# Patient Record
Sex: Female | Born: 1955
Health system: Southern US, Community
[De-identification: ages and names within clinical notes are randomized; demographics above are authoritative.]

## PROBLEM LIST (undated history)

## (undated) DIAGNOSIS — M1990 Unspecified osteoarthritis, unspecified site: Secondary | ICD-10-CM

## (undated) DIAGNOSIS — H409 Unspecified glaucoma: Secondary | ICD-10-CM

## (undated) DIAGNOSIS — R7303 Prediabetes: Secondary | ICD-10-CM

## (undated) DIAGNOSIS — G629 Polyneuropathy, unspecified: Secondary | ICD-10-CM

## (undated) DIAGNOSIS — I10 Essential (primary) hypertension: Secondary | ICD-10-CM

## (undated) DIAGNOSIS — K08109 Complete loss of teeth, unspecified cause, unspecified class: Secondary | ICD-10-CM

## (undated) DIAGNOSIS — I639 Cerebral infarction, unspecified: Secondary | ICD-10-CM

## (undated) DIAGNOSIS — R21 Rash and other nonspecific skin eruption: Secondary | ICD-10-CM

## (undated) DIAGNOSIS — F0150 Vascular dementia without behavioral disturbance: Secondary | ICD-10-CM

## (undated) DIAGNOSIS — E559 Vitamin D deficiency, unspecified: Secondary | ICD-10-CM

## (undated) DIAGNOSIS — Z86718 Personal history of other venous thrombosis and embolism: Secondary | ICD-10-CM

## (undated) DIAGNOSIS — Z8673 Personal history of transient ischemic attack (TIA), and cerebral infarction without residual deficits: Secondary | ICD-10-CM

## (undated) DIAGNOSIS — G43909 Migraine, unspecified, not intractable, without status migrainosus: Secondary | ICD-10-CM

## (undated) DIAGNOSIS — K219 Gastro-esophageal reflux disease without esophagitis: Secondary | ICD-10-CM

## (undated) DIAGNOSIS — R011 Cardiac murmur, unspecified: Secondary | ICD-10-CM

## (undated) DIAGNOSIS — Z973 Presence of spectacles and contact lenses: Secondary | ICD-10-CM

## (undated) DIAGNOSIS — Z9989 Dependence on other enabling machines and devices: Secondary | ICD-10-CM

## (undated) DIAGNOSIS — R0789 Other chest pain: Secondary | ICD-10-CM

## (undated) DIAGNOSIS — R9431 Abnormal electrocardiogram [ECG] [EKG]: Secondary | ICD-10-CM

## (undated) DIAGNOSIS — Z87898 Personal history of other specified conditions: Secondary | ICD-10-CM

## (undated) DIAGNOSIS — D509 Iron deficiency anemia, unspecified: Secondary | ICD-10-CM

## (undated) DIAGNOSIS — Z872 Personal history of diseases of the skin and subcutaneous tissue: Secondary | ICD-10-CM

## (undated) DIAGNOSIS — J309 Allergic rhinitis, unspecified: Secondary | ICD-10-CM

## (undated) HISTORY — PX: FOOT SURGERY: SHX648

## (undated) HISTORY — PX: BUNIONECTOMY: SHX129

## (undated) HISTORY — PX: BONE BIOPSY: SHX375

## (undated) HISTORY — PX: HARDWARE REMOVAL: SHX979

## (undated) HISTORY — DX: Other chest pain: R07.89

## (undated) HISTORY — PX: COLONOSCOPY: SHX174

## (undated) HISTORY — DX: Abnormal electrocardiogram (ECG) (EKG): R94.31

## (undated) HISTORY — PX: CARDIAC CATHETERIZATION: SHX172

---
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
## 1998-01-20 ENCOUNTER — Inpatient Hospital Stay (HOSPITAL_COMMUNITY): Admission: AD | Admit: 1998-01-20 | Discharge: 1998-01-20 | Payer: Self-pay | Admitting: Obstetrics

## 1998-01-23 ENCOUNTER — Ambulatory Visit (HOSPITAL_COMMUNITY): Admission: RE | Admit: 1998-01-23 | Discharge: 1998-01-23 | Payer: Self-pay | Admitting: Obstetrics

## 1998-01-23 ENCOUNTER — Encounter: Admission: RE | Admit: 1998-01-23 | Discharge: 1998-01-23 | Payer: Self-pay | Admitting: Obstetrics

## 1998-02-06 ENCOUNTER — Encounter: Admission: RE | Admit: 1998-02-06 | Discharge: 1998-02-06 | Payer: Self-pay | Admitting: Obstetrics

## 1998-02-06 ENCOUNTER — Ambulatory Visit (HOSPITAL_COMMUNITY): Admission: RE | Admit: 1998-02-06 | Discharge: 1998-02-06 | Payer: Self-pay | Admitting: Obstetrics

## 1998-02-17 ENCOUNTER — Ambulatory Visit (HOSPITAL_COMMUNITY): Admission: RE | Admit: 1998-02-17 | Discharge: 1998-02-17 | Payer: Self-pay | Admitting: Obstetrics

## 1998-02-27 ENCOUNTER — Other Ambulatory Visit: Admission: RE | Admit: 1998-02-27 | Discharge: 1998-02-27 | Payer: Self-pay | Admitting: Obstetrics

## 1998-02-27 ENCOUNTER — Encounter: Admission: RE | Admit: 1998-02-27 | Discharge: 1998-02-27 | Payer: Self-pay | Admitting: Obstetrics

## 1998-06-14 ENCOUNTER — Emergency Department (HOSPITAL_COMMUNITY): Admission: EM | Admit: 1998-06-14 | Discharge: 1998-06-14 | Payer: Self-pay | Admitting: Emergency Medicine

## 1998-06-14 ENCOUNTER — Encounter: Payer: Self-pay | Admitting: Emergency Medicine

## 1998-06-25 ENCOUNTER — Encounter: Admission: RE | Admit: 1998-06-25 | Discharge: 1998-09-23 | Payer: Self-pay | Admitting: Family Medicine

## 2000-07-11 ENCOUNTER — Other Ambulatory Visit: Admission: RE | Admit: 2000-07-11 | Discharge: 2000-07-11 | Payer: Self-pay | Admitting: Family Medicine

## 2001-09-14 ENCOUNTER — Encounter: Payer: Self-pay | Admitting: Emergency Medicine

## 2001-09-14 ENCOUNTER — Emergency Department (HOSPITAL_COMMUNITY): Admission: EM | Admit: 2001-09-14 | Discharge: 2001-09-14 | Payer: Self-pay | Admitting: Emergency Medicine

## 2001-10-19 ENCOUNTER — Ambulatory Visit (HOSPITAL_COMMUNITY): Admission: RE | Admit: 2001-10-19 | Discharge: 2001-10-19 | Payer: Self-pay | Admitting: Internal Medicine

## 2001-10-24 ENCOUNTER — Encounter: Payer: Self-pay | Admitting: Family Medicine

## 2001-10-24 ENCOUNTER — Ambulatory Visit (HOSPITAL_COMMUNITY): Admission: RE | Admit: 2001-10-24 | Discharge: 2001-10-24 | Payer: Self-pay | Admitting: Family Medicine

## 2001-10-29 ENCOUNTER — Emergency Department (HOSPITAL_COMMUNITY): Admission: EM | Admit: 2001-10-29 | Discharge: 2001-10-29 | Payer: Self-pay | Admitting: *Deleted

## 2002-02-27 ENCOUNTER — Ambulatory Visit (HOSPITAL_COMMUNITY): Admission: RE | Admit: 2002-02-27 | Discharge: 2002-02-27 | Payer: Self-pay | Admitting: Family Medicine

## 2002-02-27 ENCOUNTER — Encounter: Payer: Self-pay | Admitting: Family Medicine

## 2003-01-17 ENCOUNTER — Ambulatory Visit (HOSPITAL_COMMUNITY): Admission: RE | Admit: 2003-01-17 | Discharge: 2003-01-17 | Payer: Self-pay | Admitting: Family Medicine

## 2003-01-17 ENCOUNTER — Encounter: Payer: Self-pay | Admitting: Family Medicine

## 2003-12-24 ENCOUNTER — Ambulatory Visit: Payer: Self-pay | Admitting: Family Medicine

## 2003-12-31 ENCOUNTER — Ambulatory Visit (HOSPITAL_COMMUNITY): Admission: RE | Admit: 2003-12-31 | Discharge: 2003-12-31 | Payer: Self-pay | Admitting: Family Medicine

## 2004-03-03 ENCOUNTER — Ambulatory Visit: Payer: Self-pay | Admitting: Family Medicine

## 2004-03-31 ENCOUNTER — Ambulatory Visit: Payer: Self-pay | Admitting: Family Medicine

## 2004-08-06 ENCOUNTER — Ambulatory Visit (HOSPITAL_COMMUNITY): Admission: RE | Admit: 2004-08-06 | Discharge: 2004-08-06 | Payer: Self-pay | Admitting: Internal Medicine

## 2004-10-14 ENCOUNTER — Inpatient Hospital Stay (HOSPITAL_COMMUNITY): Admission: AD | Admit: 2004-10-14 | Discharge: 2004-10-17 | Payer: Self-pay | Admitting: Internal Medicine

## 2005-01-08 ENCOUNTER — Ambulatory Visit (HOSPITAL_COMMUNITY): Admission: RE | Admit: 2005-01-08 | Discharge: 2005-01-08 | Payer: Self-pay | Admitting: Internal Medicine

## 2005-04-23 ENCOUNTER — Emergency Department (HOSPITAL_COMMUNITY): Admission: EM | Admit: 2005-04-23 | Discharge: 2005-04-23 | Payer: Self-pay | Admitting: *Deleted

## 2005-05-12 ENCOUNTER — Ambulatory Visit (HOSPITAL_COMMUNITY): Admission: RE | Admit: 2005-05-12 | Discharge: 2005-05-12 | Payer: Self-pay | Admitting: Obstetrics

## 2005-05-20 ENCOUNTER — Emergency Department (HOSPITAL_COMMUNITY): Admission: EM | Admit: 2005-05-20 | Discharge: 2005-05-20 | Payer: Self-pay | Admitting: Emergency Medicine

## 2005-08-24 ENCOUNTER — Encounter: Admission: RE | Admit: 2005-08-24 | Discharge: 2005-08-24 | Payer: Self-pay | Admitting: Internal Medicine

## 2005-10-19 ENCOUNTER — Emergency Department (HOSPITAL_COMMUNITY): Admission: EM | Admit: 2005-10-19 | Discharge: 2005-10-19 | Payer: Self-pay | Admitting: Emergency Medicine

## 2006-02-04 ENCOUNTER — Ambulatory Visit (HOSPITAL_COMMUNITY): Admission: RE | Admit: 2006-02-04 | Discharge: 2006-02-04 | Payer: Self-pay | Admitting: Internal Medicine

## 2007-02-07 ENCOUNTER — Ambulatory Visit (HOSPITAL_COMMUNITY): Admission: RE | Admit: 2007-02-07 | Discharge: 2007-02-07 | Payer: Self-pay | Admitting: Internal Medicine

## 2007-04-19 ENCOUNTER — Emergency Department (HOSPITAL_COMMUNITY): Admission: EM | Admit: 2007-04-19 | Discharge: 2007-04-19 | Payer: Self-pay | Admitting: Emergency Medicine

## 2008-03-12 ENCOUNTER — Ambulatory Visit (HOSPITAL_COMMUNITY): Admission: RE | Admit: 2008-03-12 | Discharge: 2008-03-12 | Payer: Self-pay | Admitting: Obstetrics

## 2008-06-03 ENCOUNTER — Emergency Department (HOSPITAL_COMMUNITY): Admission: EM | Admit: 2008-06-03 | Discharge: 2008-06-03 | Payer: Self-pay | Admitting: Emergency Medicine

## 2008-06-19 ENCOUNTER — Other Ambulatory Visit: Payer: Self-pay | Admitting: Emergency Medicine

## 2008-06-19 ENCOUNTER — Ambulatory Visit: Payer: Self-pay | Admitting: Psychiatry

## 2008-06-19 ENCOUNTER — Inpatient Hospital Stay (HOSPITAL_COMMUNITY): Admission: RE | Admit: 2008-06-19 | Discharge: 2008-06-24 | Payer: Self-pay | Admitting: Psychiatry

## 2008-10-25 ENCOUNTER — Ambulatory Visit (HOSPITAL_COMMUNITY): Admission: RE | Admit: 2008-10-25 | Discharge: 2008-10-25 | Payer: Self-pay | Admitting: Obstetrics

## 2009-03-02 ENCOUNTER — Emergency Department (HOSPITAL_COMMUNITY): Admission: EM | Admit: 2009-03-02 | Discharge: 2009-03-02 | Payer: Self-pay | Admitting: Emergency Medicine

## 2009-03-13 ENCOUNTER — Ambulatory Visit (HOSPITAL_COMMUNITY): Admission: RE | Admit: 2009-03-13 | Discharge: 2009-03-13 | Payer: Self-pay | Admitting: Internal Medicine

## 2009-03-31 ENCOUNTER — Encounter (INDEPENDENT_AMBULATORY_CARE_PROVIDER_SITE_OTHER): Payer: Self-pay | Admitting: *Deleted

## 2009-04-02 ENCOUNTER — Ambulatory Visit: Payer: Self-pay | Admitting: Gastroenterology

## 2009-04-23 ENCOUNTER — Ambulatory Visit: Payer: Self-pay | Admitting: Gastroenterology

## 2010-01-22 ENCOUNTER — Ambulatory Visit: Payer: Self-pay | Admitting: Internal Medicine

## 2010-02-02 ENCOUNTER — Emergency Department (HOSPITAL_COMMUNITY)
Admission: EM | Admit: 2010-02-02 | Discharge: 2010-02-02 | Payer: Self-pay | Source: Home / Self Care | Admitting: Family Medicine

## 2010-04-30 ENCOUNTER — Ambulatory Visit (HOSPITAL_COMMUNITY)
Admission: RE | Admit: 2010-04-30 | Discharge: 2010-04-30 | Payer: Self-pay | Source: Home / Self Care | Attending: Internal Medicine | Admitting: Internal Medicine

## 2010-05-02 ENCOUNTER — Encounter: Payer: Self-pay | Admitting: Internal Medicine

## 2010-05-03 ENCOUNTER — Encounter: Payer: Self-pay | Admitting: Obstetrics

## 2010-05-12 NOTE — Procedures (Signed)
Summary: Colonoscopy  Patient: Alexis Ball Note: All result statuses are Final unless otherwise noted.  Tests: (1) Colonoscopy (COL)   COL Colonoscopy           DONE     [facility]     [address]     [place], [place]  [address]           COLONOSCOPY PROCEDURE REPORT           PATIENT:  Alexis, Ball  MR#:  [id number]     BIRTHDATE:  01-02-1956, 53 yrs. old  GENDER:  female           ENDOSCOPIST:  Vania Rea. Jarold Motto, MD, [facility]     Referred by:  Della Goo, M.D.           PROCEDURE DATE:  04/23/2009     PROCEDURE:  Average-risk screening colonoscopy     G0121     ASA CLASS:  Class II     INDICATIONS:  Routine Risk Screening           MEDICATIONS:   Fentanyl 50 mcg IV, Versed 6 mg IV           DESCRIPTION OF PROCEDURE:   After the risks benefits and     alternatives of the procedure were thoroughly explained, informed     consent was obtained.  Digital rectal exam was performed and     revealed no abnormalities.   The LB CF-H180AL [id number] endoscope     was introduced through the anus and advanced to the cecum, which     was identified by both the appendix and ileocecal valve, without     limitations.  The quality of the prep was adequate, using     MoviPrep.  The instrument was then slowly withdrawn as the colon     was fully examined.     <<PROCEDUREIMAGES>>           FINDINGS:  Scattered diverticula were found in the sigmoid colon.     No polyps or cancers were seen.  This was otherwise a normal     examination of the colon.   Retroflexed views in the rectum     revealed no abnormalities.    The scope was then withdrawn from     the patient and the procedure completed.           COMPLICATIONS:  None           ENDOSCOPIC IMPRESSION:     1) Diverticula, scattered in the sigmoid colon     2) No polyps or cancers     3) Otherwise normal examination     RECOMMENDATIONS:     1) Continue current colorectal screening recommendations for     "routine  risk" patients with a repeat colonoscopy in 10 years.     2) high fiber diet           REPEAT EXAM:  No           ______________________________     Vania Rea. Jarold Motto, MD, [facility] Graham           CC:           n.     eSIGNED:   Vania Rea. Felesha Moncrieffe at 04/23/2009 10:15 AM           Basich, Blackwell, [id number]  Note: An exclamation mark (!) indicates a result that was not dispersed into the flowsheet. Document Creation Date: 04/23/2009 10:15  AM _______________________________________________________________________  (1) Order result status: Final Collection or observation date-time: 04/23/2009 10:11 Requested date-time:  Receipt date-time:  Reported date-time:  Referring Physician:   Ordering Physician: Sheryn Bison [phone]) Specimen Source:  Source: Launa Grill Order Number: [id number] Lab site:   Appended Document: Colonoscopy    Clinical Lists Changes  Observations: Added new observation of COLONNXTDUE: 04/2019 (04/23/2009 11:47)

## 2010-06-24 LAB — POCT URINALYSIS DIPSTICK
Glucose, UA: NEGATIVE mg/dL
Protein, ur: NEGATIVE mg/dL
Urobilinogen, UA: 0.2 mg/dL (ref 0.0–1.0)
pH: 6 (ref 5.0–8.0)

## 2010-07-23 LAB — URINALYSIS, ROUTINE W REFLEX MICROSCOPIC
Bilirubin Urine: NEGATIVE
Glucose, UA: NEGATIVE mg/dL
Hgb urine dipstick: NEGATIVE
Protein, ur: NEGATIVE mg/dL
pH: 6.5 (ref 5.0–8.0)

## 2010-07-23 LAB — CBC
MCV: 88.7 fL (ref 78.0–100.0)
Platelets: 240 10*3/uL (ref 150–400)
WBC: 5.3 10*3/uL (ref 4.0–10.5)

## 2010-07-23 LAB — DIFFERENTIAL
Basophils Relative: 0 % (ref 0–1)
Lymphocytes Relative: 41 % (ref 12–46)
Monocytes Absolute: 0.4 10*3/uL (ref 0.1–1.0)
Neutro Abs: 2.7 10*3/uL (ref 1.7–7.7)
Neutrophils Relative %: 49 % (ref 43–77)

## 2010-07-23 LAB — BASIC METABOLIC PANEL
Calcium: 9 mg/dL (ref 8.4–10.5)
Chloride: 108 mEq/L (ref 96–112)
GFR calc non Af Amer: >60 mL/min (ref >60)
Glucose, Bld: 106 mg/dL — ABNORMAL HIGH (ref 70–99)
Potassium: 3.6 mEq/L (ref 3.5–5.1)
Sodium: 143 mEq/L (ref 135–145)

## 2010-07-23 LAB — POCT TOXICOLOGY PANEL

## 2010-07-23 LAB — URINE MICROSCOPIC-ADD ON

## 2010-07-23 LAB — ETHANOL: Alcohol, Ethyl (B): <5 mg/dL (ref 0–10)

## 2010-08-28 NOTE — Cardiovascular Report (Signed)
Alexis Ball, Alexis Ball             ACCOUNT NO.:  [id number]   MEDICAL RECORD NO.:  [id number]          PATIENT TYPE:  INP   LOCATION:  4741                         FACILITY:  [facility]   PHYSICIAN:  Cristy Hilts. Jacinto Halim, MD       DATE OF BIRTH:  1956/03/08   DATE OF PROCEDURE:  10/16/2004  DATE OF DISCHARGE:                              CARDIAC CATHETERIZATION   PROCEDURES PERFORMED:  1.  Left ventriculography.  2.  Selective left coronary arteriography.  3.  Abdominal aortogram.  4.  Selective left renal arteriography.  5.  Right femoral angiography and closure of right femoral arterial access      with StarClose.   INDICATIONS:  Alexis Ball is a 55 year old, African-American female with  history of hypertension, who was admitted to the hospital with chest pain.  She also had a markedly abnormal EKG.  Given her ongoing chest pain and  abnormal EKG, she was brought to the cardiac catheterization lab for  definitive diagnosis of coronary disease.   HEMODYNAMIC DATA:  The left ventricular pressures were 139/4, with end-  diastolic pressure of 12 mmHg.  The aortic pressure was 152/77, with a mean  of 111 mmHg.  There was no pressure gradient across the aortic valve.   ANGIOGRAPHIC DATA:  Left ventricle:  Left ventricular systolic function was  in the lower limit of normal, estimated around 50-55%.  There was no  significant mitral regurgitation.   Right coronary artery:  Right coronary artery is a large-caliber vessel.  It  is mildly tortuous in its mid segment.  Otherwise, it is normal, with a  small PDA and a small PLA.   Left main:  Left main is nonexistent.   LAD:  The LAD has a separate ostial origin from the aortic root.  The LAD is  mildly tortuous in the distal segment.  In the proximal segment, it gives  origin to moderate-size D1 and D2.  It is normal.   Circumflex:  Circumflex again has a separate ostial origin from the aorta,  and the circumflex gives origin to a large OM1 and  continues as an OM2.  The  OM2 is especially highly tortuous.  Otherwise, the vessels are normal.   Abdominal aortogram:  Abdominal aortogram revealed two renal arteries one on  either side.  There was appearance of stenosis of the left.   Selective left renal arteriography:  Selective left renal arteriography  revealed widely patent left renal artery.  There is no evidence of renal  artery stenosis in either of the renal arteries.   IMPRESSIONS:  1.  Low normal left ventricular systolic function, ejection fraction 50-55%.      No significant mitral regurgitation.  2.  Normal coronary arteries.  3.  Tortuous coronary arteries.  4.  Findings suggestive of hypertension with hypertensive heart disease.  5.  Widely patent renal arteries.   RECOMMENDATIONS:  Therapy for hypertension is indicated.  Evaluation of  noncardiac cause of chest pain is indicated.  The patient will follow up  with Dr. Della Goo for further management and evaluation.  She may  benefit  from giving Lotrel which would be a combination of amlodipine and  ACE inhibitor.  Her diastolic blood pressure in the catheterization lab was  around 105 mmHg.   TECHNIQUE OF THE PROCEDURE:  Under usual sterile precautions, using a 6  French right femoral arterial access, 6 [place] multipurpose pigtail catheter  was advanced into the ascending aorta over a 0.03 [place] J-wire.  The  catheter was entered into the left ventricle.  Left ventricular pressures  were monitored.  Hand contrast injection of LV was performed in both the LAO  and RAO projections.  The catheter was flushed with saline and pulled back  in the ascending aorta, and pressure gradient across the aortic valve was  monitored.  The right coronary artery was selective engaged, and angiography  was performed.  In a similar fashion, selective LAD and circumflex were  cannulated.  Angiography was performed.  Then, the catheter was pulled back  into the abdominal  aorta, and abdominal aortogram and selective left renal  arteriography was performed.  Then, the catheter was pulled out of the body  in the usual fashion.  Right femoral angiography was performed through the  arterial access sheath, and the access was closed with StarClose.  A total  of 100 cc of contrast was utilized for diagnostic angiography.       JRG/MEDQ  D:  10/16/2004  T:  10/16/2004  Job:  [id number]   cc:   Della Goo, M.D.  [address] [place]  [place] [address]  Fax: [phone]

## 2010-08-28 NOTE — Discharge Summary (Signed)
NAMESHEREECE, WELLBORN             ACCOUNT NO.:  [id number]   MEDICAL RECORD NO.:  [id number]          PATIENT TYPE:  INP   LOCATION:  4741                         FACILITY:  [facility]   PHYSICIAN:  Della Goo, M.D. DATE OF BIRTH:  07/24/1955   DATE OF ADMISSION:  10/14/2004  DATE OF DISCHARGE:  10/17/2004                                 DISCHARGE SUMMARY   FINAL DIAGNOSIS:  1.  Noncardiac chest pain.  2.  Cellulitis left lower extremity.  3.  Hypertension.  4.  Osteoarthritis.  5.  Normal electrocardiogram findings.  6.  Impaired memory.  7.  Situational depression.   CONSULTATIONS:  Cristy Hilts. Jacinto Halim, M.D., cardiology   HOSPITAL COURSE:  This is a 55 year old female with a history of  uncontrolled hypertension, who had been seen in the office for her regular  visit and referred to the hospital for direct admission secondary to points  of substernal chest pain that was described as being pressure like  associated with shortness of breath and diaphoresis, however, without  radiation and with an intensity of 7 to 8 out of 10 lasting minutes at a  time.  The patient reported having decreased appetite but no nausea,  vomiting, or diarrhea.   The patient was admitted to the telemetry area, placed on telemetry, placed  on p.r.n. sublingual nitroglycerin, nasal cannula oxygen, oral aspirin  therapy.  EKG was performed revealing T-wave inversion in the anterolateral  leads.  Cardiac enzymes, however, were within normal limits.  A cardiology  consultation was placed secondary to the clinical symptoms and the abnormal  EKG findings.  Her peak creatinine kinase ranged from 391 to 308.  However,  her troponin levels were negative.  The patient's medications were further  adjusted for better blood pressure control, as well.  The patient was also  placed on medication for symptomatic relief of her pain.  The patient  underwent a cardiac catheterization after cardiac evaluation, results of  which revealed normal coronary arteries, normal left ventricular end  diastolic pressures, and a normal left ventricular ejection fraction of 55%  with no significant mitral regurgitation.  The patient was discharged to  home on October 17, 2004, to continue on oral antibiotic therapy for the  cellulitis of her left lower extremity.  Case management was to follow up  with the patient and evaluate for patient's home safety.   DISCHARGE MEDICATIONS:  Lotrel 10/20 1 p.o. daily, Xanax 0.5 mg 1 p.o.  b.i.d. p.r.n., Cipro 500 mg 1 p.o. b.i.d. with food for seven days, Zocor 40  mg 1 p.o. daily, aspirin 325 mg 1 p.o. daily with food, and hydrocodone/APAP  5/500 1 p.o. b.i.d. p.r.n. pain.   DISCHARGE INSTRUCTIONS:  The patient was to follow up in one week in the  office for her post hospital check.  On discharge, the patient had stable  vital signs and was without complaints of chest pain.  The patient was  reassured that her chest pain was noncardiac at this time.  The patient was  also advised to continue on over-the-counter Zantac therapy p.r.n.  ______________________________  Della Goo, M.D.     HJ/MEDQ  D:  01/03/2005  T:  01/04/2005  Job:  [id number]

## 2010-08-28 NOTE — Discharge Summary (Signed)
[name], [name]             ACCOUNT NO.:  [id number]   MEDICAL RECORD NO.:  [id number]          PATIENT TYPE:  IPS   LOCATION:  0401                          [facility]ACILITY[facility]  [facility]   PHYSICIAN:  Anselm Jungling, MD  DATE OF BIRTH:  [birth year]   DATE OF ADMISSION:  06/19/2008  DATE OF DISCHARGE:  06/24/2008                               DISCHARGE SUMMARY   IDENTIFYING DATA AND REASON FOR ADMISSION:  This was an inpatient  psychiatric admission for Alexis Ball, a 55 year old African-American  female who was admitted for treatment of severe psychosis.  Please refer  to the admission note for further details pertaining to the symptoms,  circumstances and history that led to her hospitalization.  She was  given an initial Axis I diagnosis of rule out psychosis NOS.   MEDICAL AND LABORATORY:  The patient came to [place] with a history of  hypertension.  This was addressed with her usual hydrochlorothiazide 25  mg daily.   There were no other significant medical issues.   HOSPITAL COURSE:  The patient was admitted to the adult inpatient  psychiatric service.  She presented as a well-nourished, normally-  developed adult female, who displayed a passive, helpless and dependent  attitude, being reluctant to get out of her hospital bed and participate  in therapeutic groups and activities.  In the initial interview, she  stated that she needed help with hallucinations, but otherwise was not  able to give much history.  During the remainder of her hospital stay,  she seemed mainly focused on somatic concerns.  She denied any further  hallucinations, and, as such, no antipsychotic medications were deemed  necessary.  She was continued on Celexa 20 mg daily.   There was a family session that occurred on the final hospital day, just  prior to discharge.  In this meeting, the family therapist met with the  patient and her daughter, and her nurse aid from [facility]  The patient had been  receiving support from a program called [facility], and  stated that it had not been helpful, and that she would prefer to work  with [facility].  The patient reported that between her  daughter and [employer], she had transportation to get to follow-up  appointments and obtain medications.  She reported that her son  dispensed some of her medications for her.  The patient denied any  suicidal ideation or auditory hallucinations and denied having weapons  in the home.  The daughter and the representative from [employer] did not  express any concern about the patient's previous suicidal ideation or  hallucinations, now that they apparently subsided.  The counselor  discussed warning signs the family might look for to catch another  crisis before it starts.  The patient was discharged following this  productive meeting.  She agreed to the following aftercare plan.   AFTERCARE:  The patient was to follow up at [facility] with an appointment to see Dr. Allyne Gee on July 04, 2008 at 4:30  p.m.  The patient was also instructed to talk to her medical physician  about resuming potassium and other medications that she had previously  been on.   DISCHARGE MEDICATIONS:  1. Celexa 20 mg q.h.s.  2. Hydrochlorothiazide 1 tablet daily.  3. Her usual eye drops.   DISCHARGE DIAGNOSES:  AXIS I:  Mood disorder NOS.  AXIS II:  Deferred.  AXIS III:  History of hypertension, eye disorder.  AXIS IV:  Stressors severe.  AXIS V:  GAF on discharge 50.      Anselm Jungling, MD  Electronically Signed     SPB/MEDQ  D:  06/25/2008  T:  06/25/2008  Job:  [id number]

## 2010-12-03 ENCOUNTER — Ambulatory Visit (HOSPITAL_COMMUNITY)
Admission: RE | Admit: 2010-12-03 | Discharge: 2010-12-03 | Disposition: A | Discharge status: Home / Self Care | Payer: Medicaid Other | Source: Ambulatory Visit | Attending: Internal Medicine | Admitting: Internal Medicine

## 2010-12-03 DIAGNOSIS — M79609 Pain in unspecified limb: Secondary | ICD-10-CM | POA: Insufficient documentation

## 2010-12-03 DIAGNOSIS — I079 Rheumatic tricuspid valve disease, unspecified: Secondary | ICD-10-CM | POA: Insufficient documentation

## 2010-12-03 DIAGNOSIS — R55 Syncope and collapse: Secondary | ICD-10-CM | POA: Insufficient documentation

## 2010-12-03 DIAGNOSIS — I10 Essential (primary) hypertension: Secondary | ICD-10-CM | POA: Insufficient documentation

## 2010-12-03 DIAGNOSIS — E785 Hyperlipidemia, unspecified: Secondary | ICD-10-CM | POA: Insufficient documentation

## 2010-12-03 DIAGNOSIS — I359 Nonrheumatic aortic valve disorder, unspecified: Secondary | ICD-10-CM | POA: Insufficient documentation

## 2010-12-03 NOTE — Procedures (Unsigned)
HISTORY:  A 55 year old woman with syncopal episodes for couple of weeks now mentioned that things go dark and she passes out.  No witnessed seizures along with these episodes.  EEG for evaluation.  MEDICATIONS:  Hydrochlorothiazide, Cipram, potassium chloride, muscle relaxants, aspirin.  EEG DURATION:  21.5 minutes of EEG recording.  EEG DESCRIPTION:  This is a routine 16 channel adult EEG recording with one channel devoted to limited EKG recording.  No activation procedure was performed during the study and the study is performed in awake and drowsy state.  As the EEG opens up, I noticed that the posterior dominant rhythm consist of 8 Hz frequency with an amplitude ranging from 12 to 20 microvolts.  The rhythm is symmetrical and continuous.  There is no electrographic seizures or epileptiform discharges recorded during the study.  Most part of the study represents light stage sleep with vertex waves and synchronous spindles and some K complexes as well.  There is no other abnormality detected on the study.  EEG INTERPRETATION:  This is a normal awake and sleep EEG.  There is no evidence to suggest electrographic seizure or epileptiform discharges based on the study.          ______________________________ Levie Heritage, MD    WJ:XBJY D:  12/03/2010 15:30:04  T:  12/03/2010 22:22:17  Job #:  [id number]

## 2010-12-17 ENCOUNTER — Ambulatory Visit (HOSPITAL_COMMUNITY): Payer: Medicaid Other

## 2010-12-17 ENCOUNTER — Ambulatory Visit (HOSPITAL_COMMUNITY)
Admission: RE | Admit: 2010-12-17 | Discharge: 2010-12-17 | Disposition: A | Discharge status: Home / Self Care | Payer: Medicaid Other | Source: Ambulatory Visit | Attending: Internal Medicine | Admitting: Internal Medicine

## 2010-12-17 DIAGNOSIS — Z1389 Encounter for screening for other disorder: Secondary | ICD-10-CM | POA: Insufficient documentation

## 2010-12-17 DIAGNOSIS — R42 Dizziness and giddiness: Secondary | ICD-10-CM | POA: Insufficient documentation

## 2010-12-17 DIAGNOSIS — R55 Syncope and collapse: Secondary | ICD-10-CM | POA: Insufficient documentation

## 2010-12-17 DIAGNOSIS — R259 Unspecified abnormal involuntary movements: Secondary | ICD-10-CM | POA: Insufficient documentation

## 2010-12-17 NOTE — Procedures (Unsigned)
REFERRING PHYSICIAN:  Dr. Lovell Sheehan.  HISTORY:  A 55 year old woman with episodes of dizziness, shaking, and then some time blacking.  There has been concern for the tongue biting, but no reported seizures.  MEDICATIONS:  Aspirin, citalopram, Neurontin, hydrochlorothiazide, and potassium.  EEG DURATION:  30.5 minutes of EEG recording.  EEG DESCRIPTION:  This is a routine 16 channel adult EEG recording with one channel devoted to limited EKG recording.  Activation procedure is performed using the hyperventilation and photic stimulation and the study is performed in awake and mild drowsy state.  The posterior dominant rhythm consist of 8-9 Hz frequency with an amplitude ranging up to 18 microvolts.  The rhythm is symmetrical and continuous.  No buildup of generalized slowing was noted with the hyperventilation likely given the lack of efforts.  Driving was noted with the photic stimulation in posterior leads.  There are no electrographic seizures or epileptiform discharges noted during the study.  Some vertex waves and sleep and synchronous spindles are observed during the tracing as the patient got drowsy.  However, no deeper stages of sleep were identified during the study.  EEG INTERPRETATION:  This is a normal awake and drowsy EEG.  There is no evidence to suggest electrographic seizures or epileptiform discharges based on this study.  This EEG is similar to the patient's previous EEG performed a couple of weeks ago.          ______________________________ Levie Heritage, MD    ZO:XWRU D:  12/17/2010 15:22:20  T:  12/17/2010 22:22:43  Job #:  [id number]

## 2011-01-30 ENCOUNTER — Emergency Department (HOSPITAL_COMMUNITY)
Admission: EM | Admit: 2011-01-30 | Discharge: 2011-01-30 | Disposition: A | Discharge status: Home / Self Care | Payer: Medicaid Other | Attending: Emergency Medicine | Admitting: Emergency Medicine

## 2011-01-30 DIAGNOSIS — M79609 Pain in unspecified limb: Secondary | ICD-10-CM | POA: Insufficient documentation

## 2011-01-30 DIAGNOSIS — G8918 Other acute postprocedural pain: Secondary | ICD-10-CM | POA: Insufficient documentation

## 2011-01-30 DIAGNOSIS — I10 Essential (primary) hypertension: Secondary | ICD-10-CM | POA: Insufficient documentation

## 2011-01-30 DIAGNOSIS — M7989 Other specified soft tissue disorders: Secondary | ICD-10-CM | POA: Insufficient documentation

## 2011-04-13 HISTORY — PX: CATARACT EXTRACTION W/ INTRAOCULAR LENS  IMPLANT, BILATERAL: SHX1307

## 2011-04-15 ENCOUNTER — Ambulatory Visit: Payer: Medicaid Other | Attending: Podiatry | Admitting: Rehabilitative and Restorative Service Providers"

## 2011-04-15 ENCOUNTER — Ambulatory Visit (HOSPITAL_COMMUNITY): Payer: Medicaid Other | Admitting: Psychology

## 2011-04-15 DIAGNOSIS — IMO0001 Reserved for inherently not codable concepts without codable children: Secondary | ICD-10-CM | POA: Insufficient documentation

## 2011-04-15 DIAGNOSIS — M25673 Stiffness of unspecified ankle, not elsewhere classified: Secondary | ICD-10-CM | POA: Insufficient documentation

## 2011-04-15 DIAGNOSIS — M25579 Pain in unspecified ankle and joints of unspecified foot: Secondary | ICD-10-CM | POA: Insufficient documentation

## 2011-04-15 DIAGNOSIS — M25676 Stiffness of unspecified foot, not elsewhere classified: Secondary | ICD-10-CM | POA: Insufficient documentation

## 2011-04-15 DIAGNOSIS — R262 Difficulty in walking, not elsewhere classified: Secondary | ICD-10-CM | POA: Insufficient documentation

## 2011-05-03 ENCOUNTER — Ambulatory Visit: Payer: Medicaid Other | Admitting: Physical Therapy

## 2011-05-05 ENCOUNTER — Ambulatory Visit: Payer: Medicaid Other | Admitting: Physical Therapy

## 2011-05-12 ENCOUNTER — Ambulatory Visit: Payer: Medicaid Other | Admitting: Physical Therapy

## 2011-05-13 ENCOUNTER — Encounter: Payer: Medicaid Other | Admitting: Physical Therapy

## 2011-05-17 ENCOUNTER — Other Ambulatory Visit (HOSPITAL_COMMUNITY): Payer: Self-pay | Admitting: Internal Medicine

## 2011-05-17 DIAGNOSIS — Z1231 Encounter for screening mammogram for malignant neoplasm of breast: Secondary | ICD-10-CM

## 2011-05-18 ENCOUNTER — Ambulatory Visit: Payer: PRIVATE HEALTH INSURANCE | Attending: Podiatry | Admitting: Physical Therapy

## 2011-05-18 DIAGNOSIS — R262 Difficulty in walking, not elsewhere classified: Secondary | ICD-10-CM | POA: Insufficient documentation

## 2011-05-18 DIAGNOSIS — M25676 Stiffness of unspecified foot, not elsewhere classified: Secondary | ICD-10-CM | POA: Insufficient documentation

## 2011-05-18 DIAGNOSIS — M25673 Stiffness of unspecified ankle, not elsewhere classified: Secondary | ICD-10-CM | POA: Insufficient documentation

## 2011-05-18 DIAGNOSIS — IMO0001 Reserved for inherently not codable concepts without codable children: Secondary | ICD-10-CM | POA: Insufficient documentation

## 2011-05-18 DIAGNOSIS — M25579 Pain in unspecified ankle and joints of unspecified foot: Secondary | ICD-10-CM | POA: Insufficient documentation

## 2011-05-20 ENCOUNTER — Ambulatory Visit: Payer: PRIVATE HEALTH INSURANCE | Admitting: Physical Therapy

## 2011-05-27 ENCOUNTER — Ambulatory Visit: Payer: PRIVATE HEALTH INSURANCE | Admitting: Physical Therapy

## 2011-06-01 ENCOUNTER — Ambulatory Visit: Payer: PRIVATE HEALTH INSURANCE | Admitting: Rehabilitative and Restorative Service Providers"

## 2011-06-10 ENCOUNTER — Ambulatory Visit: Payer: PRIVATE HEALTH INSURANCE | Admitting: Rehabilitative and Restorative Service Providers"

## 2011-06-15 ENCOUNTER — Ambulatory Visit: Payer: Medicaid Other | Attending: Podiatry | Admitting: Rehabilitative and Restorative Service Providers"

## 2011-06-15 DIAGNOSIS — M25579 Pain in unspecified ankle and joints of unspecified foot: Secondary | ICD-10-CM | POA: Insufficient documentation

## 2011-06-15 DIAGNOSIS — M25676 Stiffness of unspecified foot, not elsewhere classified: Secondary | ICD-10-CM | POA: Insufficient documentation

## 2011-06-15 DIAGNOSIS — R262 Difficulty in walking, not elsewhere classified: Secondary | ICD-10-CM | POA: Insufficient documentation

## 2011-06-15 DIAGNOSIS — M25673 Stiffness of unspecified ankle, not elsewhere classified: Secondary | ICD-10-CM | POA: Insufficient documentation

## 2011-06-15 DIAGNOSIS — IMO0001 Reserved for inherently not codable concepts without codable children: Secondary | ICD-10-CM | POA: Insufficient documentation

## 2011-06-22 ENCOUNTER — Encounter: Payer: Self-pay | Admitting: Physical Therapy

## 2011-06-28 ENCOUNTER — Ambulatory Visit (HOSPITAL_COMMUNITY): Payer: Medicaid Other

## 2011-06-28 ENCOUNTER — Ambulatory Visit: Payer: Medicaid Other | Admitting: Rehabilitative and Restorative Service Providers"

## 2011-07-05 ENCOUNTER — Ambulatory Visit (HOSPITAL_COMMUNITY)
Admission: RE | Admit: 2011-07-05 | Discharge: 2011-07-05 | Disposition: A | Discharge status: Home / Self Care | Payer: Medicare Other | Source: Ambulatory Visit | Attending: Internal Medicine | Admitting: Internal Medicine

## 2011-07-05 DIAGNOSIS — Z1231 Encounter for screening mammogram for malignant neoplasm of breast: Secondary | ICD-10-CM

## 2011-07-06 ENCOUNTER — Ambulatory Visit: Payer: Medicaid Other | Admitting: Physical Therapy

## 2011-07-12 ENCOUNTER — Ambulatory Visit: Payer: Medicaid Other | Attending: Podiatry | Admitting: Physical Therapy

## 2011-07-12 DIAGNOSIS — M25673 Stiffness of unspecified ankle, not elsewhere classified: Secondary | ICD-10-CM | POA: Insufficient documentation

## 2011-07-12 DIAGNOSIS — R262 Difficulty in walking, not elsewhere classified: Secondary | ICD-10-CM | POA: Insufficient documentation

## 2011-07-12 DIAGNOSIS — M25579 Pain in unspecified ankle and joints of unspecified foot: Secondary | ICD-10-CM | POA: Insufficient documentation

## 2011-07-12 DIAGNOSIS — IMO0001 Reserved for inherently not codable concepts without codable children: Secondary | ICD-10-CM | POA: Insufficient documentation

## 2011-07-12 DIAGNOSIS — M25676 Stiffness of unspecified foot, not elsewhere classified: Secondary | ICD-10-CM | POA: Insufficient documentation

## 2011-10-29 ENCOUNTER — Other Ambulatory Visit: Payer: Self-pay | Admitting: Obstetrics

## 2012-05-02 ENCOUNTER — Emergency Department (INDEPENDENT_AMBULATORY_CARE_PROVIDER_SITE_OTHER)
Admission: EM | Admit: 2012-05-02 | Discharge: 2012-05-02 | Disposition: A | Discharge status: Home / Self Care | Payer: PRIVATE HEALTH INSURANCE | Source: Home / Self Care | Attending: Emergency Medicine | Admitting: Emergency Medicine

## 2012-05-02 ENCOUNTER — Encounter (HOSPITAL_COMMUNITY): Payer: Self-pay

## 2012-05-02 DIAGNOSIS — Z1833 Retained wood fragments: Secondary | ICD-10-CM

## 2012-05-02 HISTORY — DX: Essential (primary) hypertension: I10

## 2012-05-02 NOTE — ED Provider Notes (Signed)
History     CSN: [id number]  Arrival date & time 05/02/12  1029   First MD Initiated Contact with Patient 05/02/12 1046      Chief Complaint  Patient presents with  . Finger Injury    (Consider location/radiation/quality/duration/timing/severity/associated sxs/prior treatment) HPI Comments: Patient presents to urgent care this morning after having sustained an injury to her right fourth finger where she was trying to open a drawer that was stuck, and by accident in your finger where she suspects there is a wood splinter underneath her nail. At home she attempted to get it out several times, daily.small portions of it out. Continues to experience a throbbing type pain on the affected finger. Denies any drainage redness or swelling. Decided to come in this morning to see if we can get the splinter out. She denies any further symptoms or restrictions of range of motion with the affected finger.  Patient is a 57 y.o. female presenting with hand pain.  Hand Pain This is a new problem. The current episode started more than 2 days ago. The problem occurs constantly. The problem has been gradually worsening. Exacerbated by: touch and movement. The treatment provided no relief.    Past Medical History  Diagnosis Date  . Hypertension     History reviewed. No pertinent past surgical history.  History reviewed. No pertinent family history.  History  Substance Use Topics  . Smoking status: Never Smoker   . Smokeless tobacco: Not on file  . Alcohol Use: No    OB History    Grav Para Term Preterm Abortions TAB SAB Ect Mult Living                  Review of Systems  Constitutional: Positive for activity change. Negative for fever, chills, diaphoresis, appetite change and fatigue.  Musculoskeletal: Negative for myalgias and joint swelling.  Skin: Negative for color change, pallor, rash and wound.  Neurological: Negative for numbness.    Allergies  Review of patient's allergies  indicates no known allergies.  Home Medications   Current Outpatient Rx  Name  Route  Sig  Dispense  Refill  . ASPIRIN 81 MG PO TABS   Oral   Take 81 mg by mouth daily.         Marland Kitchen CITALOPRAM HYDROBROMIDE 20 MG PO TABS   Oral   Take 20 mg by mouth daily.         . CYCLOBENZAPRINE HCL 10 MG PO TABS   Oral   Take 10 mg by mouth 3 (three) times daily as needed.         Marland Kitchen HYDROCHLOROTHIAZIDE 25 MG PO TABS   Oral   Take 25 mg by mouth daily.         Marland Kitchen ZOLPIDEM TARTRATE 10 MG PO TABS   Oral   Take 10 mg by mouth at bedtime as needed.           BP 116/80  Pulse 84  Temp 97.7 F (36.5 C) (Oral)  Resp 18  Ht 5\' 2"  (1.575 m)  Wt 195 lb (88.451 kg)  BMI 35.67 kg/m2  SpO2 96%  Physical Exam  Nursing note and vitals reviewed. Constitutional: Vital signs are normal. She appears well-developed and well-nourished.  Non-toxic appearance. She does not have a sickly appearance. She does not appear ill. No distress.  Musculoskeletal: She exhibits tenderness.       Hands: Neurological: She is alert.  Skin: No rash noted. No erythema.  ED Course  FOREIGN BODY REMOVAL Performed by: Beonca Gibb Authorized by: Jimmie Molly Consent: Verbal consent obtained. Risks and benefits: risks, benefits and alternatives were discussed Consent given by: patient Patient understanding: patient states understanding of the procedure being performed Body area: skin General location: upper extremity Location details: right hand Patient sedated: no Patient restrained: no Removal mechanism: hemostat Complexity: simple Post-procedure assessment: residual foreign bodies remain Comments: Suspect residual material- should be pigmentation only vs an actual remaining portion 24f removed wood splinter   (including critical care time)  Labs Reviewed - No data to display No results found.   1. Retained wood splinter       MDM  Removal of- wood splinter from right finger. Post procedure  it is noted a discrete minimal and focal opacification on  transillumination. Tenderness-  Tested post procedure with remarkable improvement- after having removed a wood splinter measuring approximately 1 cm in length. Have discussed with patient management for a potential residual of this FB- although I suspect this is a pigmentation line residual from where it was. Patient understands that she will need further surgical intervention is to remove her nail most likely if signs of infection, patient instructed as to followup with an orthopedic Dr. If increased pain, any swelling, redness, or any drainag losing from under her nail e   Jimmie Molly, MD 05/02/12 1205

## 2012-05-02 NOTE — ED Notes (Signed)
Injury to finger when opened drawer , ?FB

## 2012-06-14 ENCOUNTER — Other Ambulatory Visit (HOSPITAL_COMMUNITY): Payer: Self-pay | Admitting: Internal Medicine

## 2012-06-14 DIAGNOSIS — Z1231 Encounter for screening mammogram for malignant neoplasm of breast: Secondary | ICD-10-CM

## 2012-07-06 ENCOUNTER — Ambulatory Visit (HOSPITAL_COMMUNITY): Payer: PRIVATE HEALTH INSURANCE | Attending: Internal Medicine

## 2012-07-17 ENCOUNTER — Ambulatory Visit (HOSPITAL_COMMUNITY)
Admission: RE | Admit: 2012-07-17 | Discharge: 2012-07-17 | Disposition: A | Discharge status: Home / Self Care | Payer: PRIVATE HEALTH INSURANCE | Source: Ambulatory Visit | Attending: Internal Medicine | Admitting: Internal Medicine

## 2012-07-17 DIAGNOSIS — Z1231 Encounter for screening mammogram for malignant neoplasm of breast: Secondary | ICD-10-CM | POA: Insufficient documentation

## 2012-10-10 ENCOUNTER — Ambulatory Visit: Payer: PRIVATE HEALTH INSURANCE | Attending: Podiatry | Admitting: Physical Therapy

## 2012-10-10 DIAGNOSIS — IMO0001 Reserved for inherently not codable concepts without codable children: Secondary | ICD-10-CM | POA: Insufficient documentation

## 2012-10-10 DIAGNOSIS — R262 Difficulty in walking, not elsewhere classified: Secondary | ICD-10-CM | POA: Insufficient documentation

## 2012-10-10 DIAGNOSIS — R609 Edema, unspecified: Secondary | ICD-10-CM | POA: Insufficient documentation

## 2012-10-10 DIAGNOSIS — M25579 Pain in unspecified ankle and joints of unspecified foot: Secondary | ICD-10-CM | POA: Insufficient documentation

## 2012-10-19 ENCOUNTER — Ambulatory Visit: Payer: PRIVATE HEALTH INSURANCE | Admitting: Physical Therapy

## 2012-10-23 ENCOUNTER — Ambulatory Visit: Payer: PRIVATE HEALTH INSURANCE | Admitting: Physical Therapy

## 2012-10-25 ENCOUNTER — Ambulatory Visit: Payer: PRIVATE HEALTH INSURANCE | Admitting: Physical Therapy

## 2012-10-31 ENCOUNTER — Ambulatory Visit: Payer: PRIVATE HEALTH INSURANCE | Admitting: Physical Therapy

## 2012-11-07 ENCOUNTER — Ambulatory Visit: Payer: PRIVATE HEALTH INSURANCE | Admitting: Physical Therapy

## 2012-11-08 ENCOUNTER — Ambulatory Visit: Payer: PRIVATE HEALTH INSURANCE | Admitting: Physical Therapy

## 2012-11-14 ENCOUNTER — Ambulatory Visit: Payer: PRIVATE HEALTH INSURANCE | Attending: Podiatry | Admitting: Physical Therapy

## 2012-11-14 DIAGNOSIS — M25579 Pain in unspecified ankle and joints of unspecified foot: Secondary | ICD-10-CM | POA: Diagnosis not present

## 2012-11-14 DIAGNOSIS — IMO0001 Reserved for inherently not codable concepts without codable children: Secondary | ICD-10-CM | POA: Insufficient documentation

## 2012-11-14 DIAGNOSIS — R609 Edema, unspecified: Secondary | ICD-10-CM | POA: Insufficient documentation

## 2012-11-14 DIAGNOSIS — R262 Difficulty in walking, not elsewhere classified: Secondary | ICD-10-CM | POA: Diagnosis not present

## 2012-11-16 ENCOUNTER — Ambulatory Visit: Payer: PRIVATE HEALTH INSURANCE | Admitting: Physical Therapy

## 2012-11-16 DIAGNOSIS — IMO0001 Reserved for inherently not codable concepts without codable children: Secondary | ICD-10-CM | POA: Diagnosis not present

## 2012-11-21 ENCOUNTER — Ambulatory Visit: Payer: PRIVATE HEALTH INSURANCE | Admitting: Physical Therapy

## 2012-11-21 DIAGNOSIS — IMO0001 Reserved for inherently not codable concepts without codable children: Secondary | ICD-10-CM | POA: Diagnosis not present

## 2012-11-23 ENCOUNTER — Ambulatory Visit: Payer: PRIVATE HEALTH INSURANCE | Admitting: Physical Therapy

## 2012-11-23 DIAGNOSIS — IMO0001 Reserved for inherently not codable concepts without codable children: Secondary | ICD-10-CM | POA: Diagnosis not present

## 2012-11-28 ENCOUNTER — Encounter: Payer: Self-pay | Admitting: Rehabilitation

## 2012-11-29 ENCOUNTER — Ambulatory Visit: Payer: PRIVATE HEALTH INSURANCE | Admitting: Physical Therapy

## 2012-11-29 DIAGNOSIS — IMO0001 Reserved for inherently not codable concepts without codable children: Secondary | ICD-10-CM | POA: Diagnosis not present

## 2012-12-05 ENCOUNTER — Ambulatory Visit: Payer: PRIVATE HEALTH INSURANCE | Admitting: Physical Therapy

## 2012-12-05 DIAGNOSIS — IMO0001 Reserved for inherently not codable concepts without codable children: Secondary | ICD-10-CM | POA: Diagnosis not present

## 2012-12-07 ENCOUNTER — Ambulatory Visit: Payer: PRIVATE HEALTH INSURANCE | Admitting: Physical Therapy

## 2012-12-07 DIAGNOSIS — IMO0001 Reserved for inherently not codable concepts without codable children: Secondary | ICD-10-CM | POA: Diagnosis not present

## 2012-12-13 ENCOUNTER — Ambulatory Visit: Payer: PRIVATE HEALTH INSURANCE | Attending: Podiatry | Admitting: Physical Therapy

## 2012-12-13 DIAGNOSIS — M25579 Pain in unspecified ankle and joints of unspecified foot: Secondary | ICD-10-CM | POA: Insufficient documentation

## 2012-12-13 DIAGNOSIS — R609 Edema, unspecified: Secondary | ICD-10-CM | POA: Insufficient documentation

## 2012-12-13 DIAGNOSIS — R262 Difficulty in walking, not elsewhere classified: Secondary | ICD-10-CM | POA: Insufficient documentation

## 2012-12-13 DIAGNOSIS — IMO0001 Reserved for inherently not codable concepts without codable children: Secondary | ICD-10-CM | POA: Insufficient documentation

## 2012-12-14 ENCOUNTER — Ambulatory Visit: Payer: PRIVATE HEALTH INSURANCE | Admitting: Physical Therapy

## 2012-12-19 ENCOUNTER — Ambulatory Visit: Payer: PRIVATE HEALTH INSURANCE | Admitting: Physical Therapy

## 2012-12-21 ENCOUNTER — Ambulatory Visit: Payer: PRIVATE HEALTH INSURANCE | Admitting: Physical Therapy

## 2013-03-13 ENCOUNTER — Ambulatory Visit (INDEPENDENT_AMBULATORY_CARE_PROVIDER_SITE_OTHER): Payer: PRIVATE HEALTH INSURANCE | Admitting: Cardiology

## 2013-03-13 ENCOUNTER — Encounter: Payer: Self-pay | Admitting: Cardiology

## 2013-03-13 VITALS — BP 124/70 | HR 68 | Ht 62.0 in | Wt 201.0 lb

## 2013-03-13 DIAGNOSIS — I10 Essential (primary) hypertension: Secondary | ICD-10-CM

## 2013-03-13 DIAGNOSIS — R9431 Abnormal electrocardiogram [ECG] [EKG]: Secondary | ICD-10-CM

## 2013-03-13 DIAGNOSIS — E669 Obesity, unspecified: Secondary | ICD-10-CM

## 2013-03-13 DIAGNOSIS — R0789 Other chest pain: Secondary | ICD-10-CM

## 2013-03-13 NOTE — Patient Instructions (Signed)
Your physician recommends that you schedule a follow up appt as needed  Your physician recommends that you continue on your current medications as directed. Please refer to the Current Medication list given to you today.

## 2013-03-13 NOTE — Progress Notes (Signed)
      [address], [address] [place], [place]  [address] Phone: [phone] Fax:  [phone]  Date:  03/13/2013   ID:  Alexis Ball, DOB 10-12-1955, MRN [id number]  PCP:  Ron Parker, MD   History of Present Illness: Alexis Ball is a 57 y.o. female who was last seen in 2012 here for followup. She had cardiac catheterization in 2007 after similar T-wave inversions were noted previously. Today she describes right-sided chest discomfort, pulling-like sensation to her axilla. Her EKG demonstrates T wave inversion/T-wave changes similar to that prior her catheterization. Usually her chest discomfort occurs when laying down at night. Does not describe it as exertional. Once again right-sided.    Wt Readings from Last 3 Encounters:  03/13/13 201 lb (91.173 kg)  05/02/12 195 lb (88.451 kg)     Past Medical History  Diagnosis Date  . Hypertension     No past surgical history on file.  Current Outpatient Prescriptions  Medication Sig Dispense Refill  . aspirin 81 MG tablet Take 81 mg by mouth daily.      . brimonidine (ALPHAGAN) 0.15 % ophthalmic solution 1 drop 3 (three) times daily.      . citalopram (CELEXA) 20 MG tablet Take 20 mg by mouth daily.      . cyclobenzaprine (FLEXERIL) 10 MG tablet Take 10 mg by mouth 3 (three) times daily as needed.      . cycloSPORINE (RESTASIS) 0.05 % ophthalmic emulsion 1 drop 2 (two) times daily.      . diclofenac (VOLTAREN) 75 MG EC tablet Take 75 mg by mouth 2 (two) times daily.      . hydrochlorothiazide (HYDRODIURIL) 25 MG tablet Take 25 mg by mouth daily.      . Polyvinyl Alcohol-Povidone (REFRESH OP) Apply to eye daily.      Marland Kitchen zolpidem (AMBIEN) 10 MG tablet Take 10 mg by mouth at bedtime as needed.       No current facility-administered medications for this visit.    Allergies:   No Known Allergies  Social History:  The patient  reports that she has never smoked. She does not have any smokeless tobacco history on file.  She reports that she does not drink alcohol.   ROS:  Please see the history of present illness.   No fevers, no syncope, no chills. Recent orthopedic surgery. Walking well now.  PHYSICAL EXAM: VS:  BP 124/70  Pulse 68  Ht 5\' 2"  (1.575 m)  Wt 201 lb (91.173 kg)  BMI 36.75 kg/m2 Well nourished, well developed, in no acute distress HEENT: normal Neck: no JVD Cardiac:  normal S1, S2; RRR; no murmur Lungs:  clear to auscultation bilaterally, no wheezing, rhonchi or rales Abd: soft, nontender, no hepatomegalyOverweight Ext: no edema Skin: warm and dry Neuro: no focal abnormalities noted  EKG:  Sinus rhythm rate 68 with T-wave inversion in multiple leads diffusely. Similar EKG seen during previous encounters, personally viewed. Heart catheterization 2007-no CAD.     ASSESSMENT AND PLAN:  1. Atypical chest pain-right sided. Non-exertional. Likely musculoskeletal. Continue to monitor. If symptoms worsen or become more worrisome, further testing may be warranted. For now, continue with primary prevention. 2. Abnormal EKG-is reassuring that she had a similar EKG prior to her catheterization which showed no coronary artery disease. I do not believe that further cardiac testing is necessary at this time. 3. Obesity-encourage weight loss.  Signed, Donato Schultz, MD [facility]  03/13/2013 12:24 PM

## 2013-06-06 ENCOUNTER — Other Ambulatory Visit (HOSPITAL_COMMUNITY): Payer: Self-pay | Admitting: Family Medicine

## 2013-06-06 DIAGNOSIS — Z1231 Encounter for screening mammogram for malignant neoplasm of breast: Secondary | ICD-10-CM

## 2013-06-26 ENCOUNTER — Ambulatory Visit
Admission: RE | Admit: 2013-06-26 | Discharge: 2013-06-26 | Disposition: A | Discharge status: Home / Self Care | Payer: PRIVATE HEALTH INSURANCE | Source: Ambulatory Visit | Attending: Family Medicine | Admitting: Family Medicine

## 2013-06-26 ENCOUNTER — Other Ambulatory Visit: Payer: Self-pay | Admitting: Family Medicine

## 2013-06-26 DIAGNOSIS — S0990XA Unspecified injury of head, initial encounter: Secondary | ICD-10-CM

## 2013-07-18 ENCOUNTER — Ambulatory Visit (HOSPITAL_COMMUNITY)
Admission: RE | Admit: 2013-07-18 | Discharge: 2013-07-18 | Disposition: A | Discharge status: Home / Self Care | Payer: PRIVATE HEALTH INSURANCE | Source: Ambulatory Visit | Attending: Family Medicine | Admitting: Family Medicine

## 2013-07-18 DIAGNOSIS — Z1231 Encounter for screening mammogram for malignant neoplasm of breast: Secondary | ICD-10-CM | POA: Insufficient documentation

## 2013-07-31 ENCOUNTER — Ambulatory Visit (INDEPENDENT_AMBULATORY_CARE_PROVIDER_SITE_OTHER): Payer: PRIVATE HEALTH INSURANCE

## 2013-07-31 ENCOUNTER — Encounter: Payer: Self-pay | Admitting: Podiatry

## 2013-07-31 ENCOUNTER — Ambulatory Visit (INDEPENDENT_AMBULATORY_CARE_PROVIDER_SITE_OTHER): Payer: PRIVATE HEALTH INSURANCE | Admitting: Podiatry

## 2013-07-31 VITALS — BP 115/76 | HR 60 | Resp 18 | Ht 62.0 in | Wt 195.0 lb

## 2013-07-31 DIAGNOSIS — M79609 Pain in unspecified limb: Secondary | ICD-10-CM

## 2013-07-31 DIAGNOSIS — M79676 Pain in unspecified toe(s): Secondary | ICD-10-CM

## 2013-07-31 DIAGNOSIS — L600 Ingrowing nail: Secondary | ICD-10-CM

## 2013-07-31 NOTE — Progress Notes (Signed)
Stumped my toes on the right foot , the first three. Great toe 2 nd toe 3rd toe right foot . And i need my nails cut .  Objective: Vital signs stable she is alert and oriented x3. She has bruising and erythema with ecchymosis to toes #2 and #3 of the right foot she has a painful ingrown nail third digit right foot and a painful first metatarsal right foot status post [place] bunion repair and hammertoe procedures. Radiographic evaluation confirms dislocation of screw to second digit of the right foot and fourth digit of the right foot. Well-maintained Stryker screw first metatarsal. Cutaneous evaluation does demonstrate onychomycosis and ingrown nail third digit right foot.  Assessment: Painful internal fixation first second fourth metatarsals and toes. Painful ingrown nail third digit right foot.  Plan: We discussed etiology pathology conservative versus surgical therapies. We consented her today for removal of internal fixation first metatarsal second toe third toe. Also total matrixectomy surgical in nature third digit right foot. She signed Dr. pages of the consent form I will followup with her in the near future for surgery.

## 2013-08-08 ENCOUNTER — Ambulatory Visit (INDEPENDENT_AMBULATORY_CARE_PROVIDER_SITE_OTHER): Payer: PRIVATE HEALTH INSURANCE | Admitting: Obstetrics

## 2013-08-08 ENCOUNTER — Encounter: Payer: Self-pay | Admitting: Obstetrics

## 2013-08-08 VITALS — BP 123/87 | HR 64 | Temp 98.0°F | Ht 62.0 in | Wt 195.0 lb

## 2013-08-08 DIAGNOSIS — D259 Leiomyoma of uterus, unspecified: Secondary | ICD-10-CM | POA: Insufficient documentation

## 2013-08-08 DIAGNOSIS — N760 Acute vaginitis: Secondary | ICD-10-CM | POA: Insufficient documentation

## 2013-08-08 DIAGNOSIS — Z113 Encounter for screening for infections with a predominantly sexual mode of transmission: Secondary | ICD-10-CM

## 2013-08-08 MED ORDER — HYDROCODONE-IBUPROFEN 7.5-200 MG PO TABS: 1.0000 | ORAL_TABLET | Freq: Three times a day (TID) | ORAL | Status: DC | PRN

## 2013-08-09 ENCOUNTER — Encounter: Payer: Self-pay | Admitting: Obstetrics

## 2013-08-09 LAB — GC/CHLAMYDIA PROBE AMP
CT PROBE, AMP APTIMA: NEGATIVE
GC PROBE AMP APTIMA: NEGATIVE

## 2013-08-09 LAB — WET PREP BY MOLECULAR PROBE
Candida species: NEGATIVE
Gardnerella vaginalis: POSITIVE — AB
Trichomonas vaginosis: NEGATIVE

## 2013-08-09 NOTE — Progress Notes (Signed)
Patient ID: Alexis Ball, female   DOB: 1956-03-11, 58 y.o.   MRN: [id number]  Chief Complaint  Patient presents with  . Problem    Irritation     HPI Alexis Ball is a 58 y.o. female.  C/O vaginal irritation.  HPI  Past Medical History  Diagnosis Date  . Hypertension   . Atypical chest pain 03/13/2013  . Abnormal ECG 03/13/2013    TWI diffuse. Was seen prior to cath in 2007. No CAD.    Past Surgical History  Procedure Laterality Date  . Foot surgery      Family History  Problem Relation Age of Onset  . Hypertension Mother     Social History History  Substance Use Topics  . Smoking status: Never Smoker   . Smokeless tobacco: Not on file  . Alcohol Use: No    No Known Allergies  Current Outpatient Prescriptions  Medication Sig Dispense Refill  . aspirin 81 MG tablet Take 81 mg by mouth daily.      . brimonidine (ALPHAGAN) 0.15 % ophthalmic solution 1 drop 3 (three) times daily.      . Butalbital-Acetaminophen 50-300 MG TABS Take by mouth.      . citalopram (CELEXA) 20 MG tablet Take 20 mg by mouth daily.      . hydrochlorothiazide (HYDRODIURIL) 25 MG tablet Take 25 mg by mouth daily.      Marland Kitchen HYDROcodone-acetaminophen (NORCO) 10-325 MG per tablet       . LUMIGAN 0.01 % SOLN       . Polyvinyl Alcohol-Povidone (REFRESH OP) Apply to eye daily.      Marland Kitchen zolpidem (AMBIEN) 10 MG tablet Take 10 mg by mouth at bedtime as needed.      . dorzolamide-timolol (COSOPT) 22.3-6.8 MG/ML ophthalmic solution       . HYDROcodone-ibuprofen (VICOPROFEN) 7.5-200 MG per tablet Take 1 tablet by mouth every 8 (eight) hours as needed for moderate pain or severe pain.  40 tablet  0   No current facility-administered medications for this visit.    Review of Systems Review of Systems Constitutional: negative for fatigue and weight loss Respiratory: negative for cough and wheezing Cardiovascular: negative for chest pain, fatigue and palpitations Gastrointestinal: negative for abdominal  pain and change in bowel habits Genitourinary:negative Integument/breast: negative for nipple discharge Musculoskeletal:negative for myalgias Neurological: negative for gait problems and tremors Behavioral/Psych: negative for abusive relationship, depression Endocrine: negative for temperature intolerance     Blood pressure 123/87, pulse 64, temperature 98 F (36.7 C), height 5\' 2"  (1.575 m), weight 195 lb (88.451 kg).  Physical Exam Physical Exam General:   alert  Skin:   no rash or abnormalities  Lungs:   clear to auscultation bilaterally  Heart:   regular rate and rhythm, S1, S2 normal, no murmur, click, rub or gallop  Breasts:   normal without suspicious masses, skin or nipple changes or axillary nodes  Abdomen:  normal findings: no organomegaly, soft, non-tender and no hernia  Pelvis:  External genitalia: normal general appearance Urinary system: urethral meatus normal and bladder without fullness, nontender Vaginal: normal without tenderness, induration or masses Cervix: normal appearance Adnexa: normal bimanual exam Uterus: anteverted and non-tender, normal size   Data Reviewed None  Assessment    Vulvovaginitis.     Plan    Orders Placed This Encounter  Procedures  . WET PREP BY MOLECULAR PROBE  . GC/Chlamydia Probe Amp   Meds ordered this encounter  Medications  . Butalbital-Acetaminophen 50-300 MG TABS  Sig: Take by mouth.  Marland Kitchen HYDROcodone-ibuprofen (VICOPROFEN) 7.5-200 MG per tablet    Sig: Take 1 tablet by mouth every 8 (eight) hours as needed for moderate pain or severe pain.    Dispense:  40 tablet    Refill:  0     Possible management options include:  Will await results of wet prep. Follow up as needed.       Shelly Bombard 08/09/2013, 2:54 AM

## 2013-09-11 ENCOUNTER — Ambulatory Visit (INDEPENDENT_AMBULATORY_CARE_PROVIDER_SITE_OTHER): Payer: PRIVATE HEALTH INSURANCE

## 2013-09-11 ENCOUNTER — Telehealth: Payer: Self-pay | Admitting: *Deleted

## 2013-09-11 VITALS — BP 118/76 | HR 68 | Resp 16

## 2013-09-11 DIAGNOSIS — Z472 Encounter for removal of internal fixation device: Secondary | ICD-10-CM

## 2013-09-11 DIAGNOSIS — T84498A Other mechanical complication of other internal orthopedic devices, implants and grafts, initial encounter: Secondary | ICD-10-CM

## 2013-09-11 DIAGNOSIS — T849XXA Unspecified complication of internal orthopedic prosthetic device, implant and graft, initial encounter: Secondary | ICD-10-CM

## 2013-09-11 DIAGNOSIS — S90129A Contusion of unspecified lesser toe(s) without damage to nail, initial encounter: Secondary | ICD-10-CM

## 2013-09-11 MED ORDER — CLINDAMYCIN HCL 300 MG PO CAPS: 300.0000 mg | ORAL_CAPSULE | Freq: Three times a day (TID) | ORAL | Status: DC

## 2013-09-11 MED ORDER — HYDROCODONE-ACETAMINOPHEN 10-325 MG PO TABS: 1.0000 | ORAL_TABLET | Freq: Four times a day (QID) | ORAL | Status: DC | PRN

## 2013-09-11 NOTE — Progress Notes (Signed)
   Subjective:    Patient ID: Gerarda Fraction, female    DOB: [birth year], 58 y.o.   MRN: [id number]  HPI Comments: "I have pain around this toenail from accidentally kicking my Lucianne Lei seat"  Patient c/o aching, throbbing 2nd toe right for 1 week. She kicked the back of her Lucianne Lei seat and knocked some of the toenail off. The toe got swollen and dark and started to develop a sore on the end of the toe. She has been soaking and elevating it. Very difficult to walk and can't wear a enclosed shoe.   NOTE:  Patient has had toe surgery and does have an internal screw in this toe.     Review of Systems  Musculoskeletal: Positive for arthralgias and gait problem.  Skin: Positive for wound.  All other systems reviewed and are negative.      Objective:   Physical Exam 58 year old [place] American female presents this time well-developed well-nourished R. x3 with an injury to her second toe right foot. Patient injured her second toe. Laceration of the nailbed and distal tuft of the digit patient is a history of screw fixation at digit at this time appears to be a granulomatous lesion distal tuft of the digit nail bed with possible exposure screw x-ray confirms displacement distal phalanx and screw head exposure.  Washing objective findings as follows vascular status is intact pedal pulses are palpable DP and PT posterior were for Or refill time 3 seconds all digits skin temperature is warm turgor normal no edema rubor pallor mild varicosities noted neurologic epicritic and proprioceptive sensations intact and symmetric. Patient had previous surgery hammertoes 2 through 4 and 5 however the second and fourth have intact screw fixations noted on x-rays taken at this time. There is a granulomatous lesion open ulceration distal tuft of the second digit and nailbed the nail has been pulled back somewhat apparently by the trauma hit against a chair or car seat remainder of exam is otherwise unremarkable is painful  tender and symptomatic.       Assessment & Plan:  Assessment this time is contusion of toe with exposure of internal fixation screw head and displacement distal phalanx and exposure screw fixation. Recommend patient's past remove the screw fixation is likely wound is contaminated the digits are thready successfully and likely will not be destabilized by removal of the screw at this time we'll the sinus pressure. Antifungal local anesthetic block to the second total of 3 cc 50-50 mixture to Xylocaine plain and 0 point? Implant of the digits prepped with Betadine and utilizing aseptic technique the granulomatous tissue is debrided back an exposed screw head is identified it is carefully retrograded from the site utilizing a screwdriver in hemostats. Should note once the screws remove the site was lavaged with sterile saline and Betadine and gauze dressing reapplied she lies Steri-Strips rather than suture to reapproximate the wound and nailbed the remaining portion of nail which was loose and was also debrided away at this time evaluate and gauze dressing reapplied will be maintained for at least 570 duration for followup postop check at that time. Also at this time prescription for pain medication hydrocodone acetaminophen 10/325 is administered the patient also prescription for clindamycin 3 mg 3 times a day x10 days is given the patient pharmacy patient will monitor for any fever chills maintain a Darco shoe which is also dispensed at this time. Recheck in one week are within 1 week schedule for postop followup  Harriet Masson DPM

## 2013-09-11 NOTE — Patient Instructions (Signed)

## 2013-09-11 NOTE — Telephone Encounter (Signed)
I stumped my toe on Saturday.  I been soaking it.  My toe is leaking pus.  My second toe is leaking pus, it's dark and has yellow runny pus.  Please call me back.  I called and left her a message at 9:53am to call and schedule an appointment for today at 11:30am only with Dr. Blenda Mounts, this is the only time he has available today.  I also informed the schedulers.

## 2013-09-14 ENCOUNTER — Ambulatory Visit (INDEPENDENT_AMBULATORY_CARE_PROVIDER_SITE_OTHER): Payer: PRIVATE HEALTH INSURANCE

## 2013-09-14 VITALS — BP 120/66 | HR 78 | Resp 16

## 2013-09-14 DIAGNOSIS — T84498A Other mechanical complication of other internal orthopedic devices, implants and grafts, initial encounter: Secondary | ICD-10-CM

## 2013-09-14 DIAGNOSIS — M79609 Pain in unspecified limb: Secondary | ICD-10-CM

## 2013-09-14 DIAGNOSIS — T849XXA Unspecified complication of internal orthopedic prosthetic device, implant and graft, initial encounter: Secondary | ICD-10-CM

## 2013-09-14 DIAGNOSIS — S90129A Contusion of unspecified lesser toe(s) without damage to nail, initial encounter: Secondary | ICD-10-CM

## 2013-09-14 DIAGNOSIS — M79676 Pain in unspecified toe(s): Secondary | ICD-10-CM

## 2013-09-14 NOTE — Progress Notes (Signed)
   Subjective:    Patient ID: Alexis Ball, female    DOB: 24-Nov-1955, 58 y.o.   MRN: [id number]  HPI Comments: "It has been sore, but much better than it was. Dr. Milinda Pointer had talked about removing the rest of these pins, but I need to have it done by June 20th."  Toe Injury - Follow up in office screw removal 2nd toe right      Review of Systems no systemic changes or findings     Objective:   Physical Exam Patient presents at this time for followup having had a painful screw fixation roof of the second toe right foot is doing much better still some slight edema noted no apparent discharge drainage some dry blood on the dressing in the dressings no active bleeding discharge noted there still some tenderness and edema the toe patient is advised to keep the bandage on Silvadene gauze dressing reapplied at this time maintained until Sunday so they may remove the best dressings and resume normal bathing with soap and water apply Neosporin Band-Aid dressing thereafter patient is it should following up with Dr. Milinda Pointer regarding removal of remaining pins in adjacent digits she will discuss this with them with the next week or 2 with possible consultation and arranged       Assessment & Plan:  Assessment contusion of toe with a displaced fixation had been removed 3 days ago the toes feeling much better edema and erythema going down no malodor no ascending psoas lymphangitis noted her patient best to complete her at bike regimen of May remove mid is resume bathing and dressing changes starting on Sunday up until that time maintain a clean sterile dressing is in place today. Reappointed for surgical consult for possible pin removal surgical removals with Dr. Milinda Pointer with the next week to 2 weeks  Harriet Masson DPM

## 2013-09-14 NOTE — Patient Instructions (Signed)
ANTIBACTERIAL SOAP INSTRUCTIONS  THE DAY AFTER PROCEDURE  Please follow the instructions your doctor has marked.   Shower as usual. Before getting out, place a drop of antibacterial liquid soap (Dial) on a wet, clean washcloth.  Gently wipe washcloth over affected area.  Afterward, rinse the area with warm water.  Blot the area dry with a soft cloth and cover with antibiotic ointment (neosporin, polysporin, bacitracin) and band aid or gauze and tape  Place 3-4 drops of antibacterial liquid soap in a quart of warm tap water.  Submerge foot into water for 20 minutes.  If bandage was applied after your procedure, leave on to allow for easy lift off, then remove and continue with soak for the remaining time.  Next, blot area dry with a soft cloth and cover with a bandage.  Apply other medications as directed by your doctor, such as cortisporin otic solution (eardrops) or neosporin antibiotic ointment  Beginning Sunday may remove the dressing is in place and resume normal bathing or washing in warm soapy water dry and apply Neosporin and Band-Aid dressing as instructed cleansed daily and reapply Band-Aid dressings.  Also maintain antibiotics until completely used

## 2013-09-18 ENCOUNTER — Encounter: Payer: Self-pay | Admitting: Podiatry

## 2013-09-18 ENCOUNTER — Ambulatory Visit (INDEPENDENT_AMBULATORY_CARE_PROVIDER_SITE_OTHER): Payer: PRIVATE HEALTH INSURANCE | Admitting: Podiatry

## 2013-09-18 VITALS — BP 146/88 | HR 60 | Resp 12

## 2013-09-18 DIAGNOSIS — T849XXA Unspecified complication of internal orthopedic prosthetic device, implant and graft, initial encounter: Secondary | ICD-10-CM

## 2013-09-18 DIAGNOSIS — T84498A Other mechanical complication of other internal orthopedic devices, implants and grafts, initial encounter: Secondary | ICD-10-CM

## 2013-09-18 NOTE — Patient Instructions (Signed)
Pre-Operative Instructions  Congratulations, you have decided to take an important step to improving your quality of life.  You can be assured that the doctors of [facility] will be with you every step of the way.  1. Plan to be at the surgery center/hospital at least 1 (one) hour prior to your scheduled time unless otherwise directed by the surgical center/hospital staff.  You must have a responsible adult accompany you, remain during the surgery and drive you home.  Make sure you have directions to the surgical center/hospital and know how to get there on time. 2. For hospital based surgery you will need to obtain a history and physical form from your family physician within 1 month prior to the date of surgery- we will give you a form for you primary physician.  3. We make every effort to accommodate the date you request for surgery.  There are however, times where surgery dates or times have to be moved.  We will contact you as soon as possible if a change in schedule is required.   4. No Aspirin/Ibuprofen for one week before surgery.  If you are on aspirin, any non-steroidal anti-inflammatory medications (Mobic, Aleve, Ibuprofen) you should stop taking it 7 days prior to your surgery.  You make take Tylenol  For pain prior to surgery.  5. Medications- If you are taking daily heart and blood pressure medications, seizure, reflux, allergy, asthma, anxiety, pain or diabetes medications, make sure the surgery center/hospital is aware before the day of surgery so they may notify you which medications to take or avoid the day of surgery. 6. No food or drink after midnight the night before surgery unless directed otherwise by surgical center/hospital staff. 7. No alcoholic beverages 24 hours prior to surgery.  No smoking 24 hours prior to or 24 hours after surgery. 8. Wear loose pants or shorts- loose enough to fit over bandages, boots, and casts. 9. No slip on shoes, sneakers are best. 10. Bring  your boot with you to the surgery center/hospital.  Also bring crutches or a walker if your physician has prescribed it for you.  If you do not have this equipment, it will be provided for you after surgery. 11. If you have not been contracted by the surgery center/hospital by the day before your surgery, call to confirm the date and time of your surgery. 12. Leave-time from work may vary depending on the type of surgery you have.  Appropriate arrangements should be made prior to surgery with your employer. 13. Prescriptions will be provided immediately following surgery by your doctor.  Have these filled as soon as possible after surgery and take the medication as directed. 14. Remove nail polish on the operative foot. 15. Wash the night before surgery.  The night before surgery wash the foot and leg well with the antibacterial soap provided and water paying special attention to beneath the toenails and in between the toes.  Rinse thoroughly with water and dry well with a towel.  Perform this wash unless told not to do so by your physician.  Enclosed: 1 Ice pack (please put in freezer the night before surgery)   1 Hibiclens skin cleaner   Pre-op Instructions  If you have any questions regarding the instructions, do not hesitate to call our office.  [place]: [address] [place], [place] [address] [phone]  [place]: [address] [place], [place] [address] [phone]  [place]: [address]  [place], [place] [address] [phone]  Dr. Richard   Tuchman DPM, Dr. Norman Regal DPM Dr. Richard Sikora DPM, Dr. M. Todd Hyatt DPM, Dr. Kathryn Egerton DPM 

## 2013-09-18 NOTE — Progress Notes (Signed)
She presents today for followup of her removal the screw second digit of her right foot on an acute basis. She states it seems to be doing much better and is healing well. She's also complaining of screw to the fourth digit of the right foot as well as a screw to the first metatarsal of the left foot status post bunion repair and hammertoe repair. She states they bother her with weather changes. If she like to have been removed before similar incidents occurs to that of the second toe.  Objective: No erythema edema cellulitis drainage or odor to the distal aspect of the second toe with a screw was removed. Mildly tender on palpation however I see no signs of infection in this area. She's been on palpation to the fourth digit of the right foot and pain on palpation of the first metatarsal with screw head is palpable. Radiographs reviewed today do demonstrate retention of painful fixation.  Assessment: Complication of internal fixation first metatarsal right foot and fourth digit of the right foot.  Plan: Discussed etiology pathology conservative versus surgical therapies. I encouraged her to soak her right foot today and Epsom salts and water at least once a day. She understands that is amenable to it. We also considered her today for screw removal from the first metatarsal of the right foot. Screw removal from the fourth digit of the right foot. She understands this and is amenable to it signed Dr. pages of the consent form and I will followup with her in the near future for surgical intervention.

## 2013-09-27 ENCOUNTER — Other Ambulatory Visit: Payer: Self-pay | Admitting: Podiatry

## 2013-09-27 MED ORDER — OXYCODONE-ACETAMINOPHEN 10-325 MG PO TABS: 1.0000 | ORAL_TABLET | ORAL | Status: DC | PRN

## 2013-09-27 MED ORDER — CEPHALEXIN 500 MG PO CAPS: 500.0000 mg | ORAL_CAPSULE | Freq: Three times a day (TID) | ORAL | Status: DC

## 2013-09-27 MED ORDER — PROMETHAZINE HCL 12.5 MG PO TABS: 25.0000 mg | ORAL_TABLET | Freq: Four times a day (QID) | ORAL | Status: DC | PRN

## 2013-09-28 DIAGNOSIS — Z472 Encounter for removal of internal fixation device: Secondary | ICD-10-CM

## 2013-10-04 ENCOUNTER — Ambulatory Visit (INDEPENDENT_AMBULATORY_CARE_PROVIDER_SITE_OTHER): Payer: Medicare Other | Admitting: Podiatry

## 2013-10-04 ENCOUNTER — Encounter: Payer: Self-pay | Admitting: Podiatry

## 2013-10-04 ENCOUNTER — Ambulatory Visit (INDEPENDENT_AMBULATORY_CARE_PROVIDER_SITE_OTHER): Payer: Medicare Other

## 2013-10-04 VITALS — BP 125/64 | HR 71 | Temp 97.7°F | Resp 16

## 2013-10-04 DIAGNOSIS — Z9889 Other specified postprocedural states: Secondary | ICD-10-CM

## 2013-10-04 DIAGNOSIS — T84498A Other mechanical complication of other internal orthopedic devices, implants and grafts, initial encounter: Secondary | ICD-10-CM

## 2013-10-04 NOTE — Progress Notes (Signed)
She presents today for her first postop visit status post removal of painful internal fixation first metatarsal right foot as well as painful fixation from the fourth toe right foot. She denies fever chills nausea vomiting muscle aches and pains.  Objective: Dry sterile dressing intact once removed demonstrates no erythema edema saline is drainage or odor. Be healing quite nicely.  Assessment: Well-healing surgical foot right  Plan: Redressed today dressed a compressive dressing followup with me in one week

## 2013-10-11 ENCOUNTER — Ambulatory Visit (INDEPENDENT_AMBULATORY_CARE_PROVIDER_SITE_OTHER): Payer: Medicare Other | Admitting: Podiatry

## 2013-10-11 ENCOUNTER — Ambulatory Visit (INDEPENDENT_AMBULATORY_CARE_PROVIDER_SITE_OTHER): Payer: Medicare Other

## 2013-10-11 ENCOUNTER — Encounter: Payer: Self-pay | Admitting: Podiatry

## 2013-10-11 VITALS — BP 126/85 | HR 78 | Temp 97.3°F | Resp 16

## 2013-10-11 DIAGNOSIS — M21611 Bunion of right foot: Secondary | ICD-10-CM

## 2013-10-11 DIAGNOSIS — Z9889 Other specified postprocedural states: Secondary | ICD-10-CM

## 2013-10-11 DIAGNOSIS — M21619 Bunion of unspecified foot: Secondary | ICD-10-CM

## 2013-10-11 DIAGNOSIS — B351 Tinea unguium: Secondary | ICD-10-CM

## 2013-10-11 DIAGNOSIS — M79609 Pain in unspecified limb: Secondary | ICD-10-CM

## 2013-10-11 MED ORDER — HYDROCODONE-ACETAMINOPHEN 5-325 MG PO TABS: 1.0000 | ORAL_TABLET | Freq: Four times a day (QID) | ORAL | Status: DC | PRN

## 2013-10-11 NOTE — Progress Notes (Addendum)
She presents today for postop visit status post removal screw first metatarsal of the right foot and fourth digit of the right foot. She states she was doing well until someone dropped an X box on her foot. Now she states that she can hardly walk on it without significant pain. She's also complaining of elongated painful toenails one through 5 bilateral.  Objective: Vital signs are stable she is alert and oriented x3. There is no erythema edema cellulitis drainage or odor. All the sutures are then removed from the foot there is no dehiscence and the wounds appear to be healing quite nicely. A radiograph was taken of the right foot today to evaluate for fracture however there was no fracture present is probably a bony contusion. She has pain on palpation of the fourth metatarsal metadiaphyseal region right foot. Her toenails are thick yellow dystrophic onychomycotic and painful palpation.  Assessment: While surgical foot right. Contusion right foot. Pain in limb secondary to onychomycosis 1 through 5 bilateral.  Plan: Suggest that she get back into a pair greater shoes and I also wrote her prescription for 30 Vicodin and a soft she will receive. Debridement nails 1 through 5 bilateral covered service secondary to pain.

## 2013-10-15 ENCOUNTER — Encounter: Payer: Self-pay | Admitting: Podiatry

## 2013-10-15 NOTE — Progress Notes (Signed)
Dr Milinda Pointer performed a removal of a fixation/screw on right 1st and 4th met on 09/28/13

## 2013-10-19 ENCOUNTER — Encounter: Payer: Self-pay | Admitting: Podiatry

## 2013-10-19 NOTE — Progress Notes (Signed)
Dr Milinda Pointer performed a removal fixation of deep Kwire/screw, 1st met 4th toe on 09/28/13

## 2013-12-31 ENCOUNTER — Ambulatory Visit (HOSPITAL_COMMUNITY): Payer: PRIVATE HEALTH INSURANCE | Attending: Emergency Medicine

## 2013-12-31 ENCOUNTER — Emergency Department (INDEPENDENT_AMBULATORY_CARE_PROVIDER_SITE_OTHER)
Admission: EM | Admit: 2013-12-31 | Discharge: 2013-12-31 | Disposition: A | Discharge status: Home / Self Care | Payer: PRIVATE HEALTH INSURANCE | Source: Home / Self Care

## 2013-12-31 ENCOUNTER — Encounter (HOSPITAL_COMMUNITY): Payer: Self-pay | Admitting: Emergency Medicine

## 2013-12-31 DIAGNOSIS — M25569 Pain in unspecified knee: Secondary | ICD-10-CM | POA: Insufficient documentation

## 2013-12-31 DIAGNOSIS — M79609 Pain in unspecified limb: Secondary | ICD-10-CM | POA: Diagnosis not present

## 2013-12-31 DIAGNOSIS — S90129A Contusion of unspecified lesser toe(s) without damage to nail, initial encounter: Secondary | ICD-10-CM | POA: Diagnosis not present

## 2013-12-31 DIAGNOSIS — H918X9 Other specified hearing loss, unspecified ear: Secondary | ICD-10-CM

## 2013-12-31 DIAGNOSIS — W19XXXA Unspecified fall, initial encounter: Secondary | ICD-10-CM | POA: Diagnosis not present

## 2013-12-31 DIAGNOSIS — S8000XA Contusion of unspecified knee, initial encounter: Secondary | ICD-10-CM

## 2013-12-31 DIAGNOSIS — H6120 Impacted cerumen, unspecified ear: Secondary | ICD-10-CM

## 2013-12-31 DIAGNOSIS — S90122A Contusion of left lesser toe(s) without damage to nail, initial encounter: Secondary | ICD-10-CM

## 2013-12-31 DIAGNOSIS — S8002XA Contusion of left knee, initial encounter: Secondary | ICD-10-CM

## 2013-12-31 DIAGNOSIS — H6123 Impacted cerumen, bilateral: Secondary | ICD-10-CM

## 2013-12-31 NOTE — ED Notes (Signed)
Left ear pain, has used over the counter medicine/eardrop.

## 2013-12-31 NOTE — ED Notes (Signed)
Checked with supportive staff

## 2013-12-31 NOTE — ED Notes (Signed)
Alexis Ball, emt did clean ears.  Transport waited to take patient to xray ( no xray available today).  [name] (reg) reported patient refused to go to xray and was leaving

## 2013-12-31 NOTE — ED Notes (Signed)
Apologized for delay, attempted to explain wait, patient aggitated

## 2013-12-31 NOTE — ED Notes (Signed)
Patient refused ace wrap in contrast to knee sleeve.  Patient preferred knee sleeve and said it felt the best

## 2013-12-31 NOTE — Discharge Instructions (Signed)
Cerumen Impaction A cerumen impaction is when the wax in your ear forms a plug. This plug usually causes reduced hearing. Sometimes it also causes an earache or dizziness. Removing a cerumen impaction can be difficult and painful. The wax sticks to the ear canal. The canal is sensitive and bleeds easily. If you try to remove a heavy wax buildup with a cotton tipped swab, you may push it in further. Irrigation with water, suction, and small ear curettes may be used to clear out the wax. If the impaction is fixed to the skin in the ear canal, ear drops may be needed for a few days to loosen the wax. People who build up a lot of wax frequently can use ear wax removal products available in your local drugstore. SEEK MEDICAL CARE IF:  You develop an earache, increased hearing loss, or marked dizziness. Document Released: 05/06/2004 Document Revised: 06/21/2011 Document Reviewed: 06/26/2009 [facility] Patient Information 2015 [place], [place]. This information is not intended to replace advice given to you by your health care provider. Make sure you discuss any questions you have with your health care provider.  Fall Prevention and Home Safety Falls cause injuries and can affect all age groups. It is possible to prevent falls.  HOW TO PREVENT FALLS  Wear shoes with rubber soles that do not have an opening for your toes.  Keep the inside and outside of your house well lit.  Use night lights throughout your home.  Remove clutter from floors.  Clean up floor spills.  Remove throw rugs or fasten them to the floor with carpet tape.  Do not place electrical cords across pathways.  Put grab bars by your tub, shower, and toilet. Do not use towel bars as grab bars.  Put handrails on both sides of the stairway. Fix loose handrails.  Do not climb on stools or stepladders, if possible.  Do not wax your floors.  Repair uneven or unsafe sidewalks, walkways, or stairs.  Keep items you use a lot within  reach.  Be aware of pets.  Keep emergency numbers next to the telephone.  Put smoke detectors in your home and near bedrooms. Ask your doctor what other things you can do to prevent falls. Document Released: 01/23/2009 Document Revised: 09/28/2011 Document Reviewed: 06/29/2011 [facility] Patient Information 2015 [place], [place]. This information is not intended to replace advice given to you by your health care provider. Make sure you discuss any questions you have with your health care provider.

## 2013-12-31 NOTE — ED Provider Notes (Signed)
CSN: [id number]     Arrival date & time 12/31/13  0258 History   First MD Initiated Contact with Patient 12/31/13 1024     Chief Complaint  Patient presents with  . Otalgia   (Consider location/radiation/quality/duration/timing/severity/associated sxs/prior Treatment) HPI Comments: C/o left ear being stopped up , feeling full and decreased hearing.   [place] [address] forward 3 d ago while outside, falling onto cement and grass. C/o persistent left knee pain, esp with wt bearing and pain left great and 2nd toes.  Denies other injury. Pt advised we did not have on site Xray and would have to be transported to the hospital for this and return. Also advised that due to staff shortage the wait will be longer than usual.   Past Medical History  Diagnosis Date  . Hypertension   . Atypical chest pain 03/13/2013  . Abnormal ECG 03/13/2013    TWI diffuse. Was seen prior to cath in 2007. No CAD.   Past Surgical History  Procedure Laterality Date  . Foot surgery     Family History  Problem Relation Age of Onset  . Hypertension Mother    History  Substance Use Topics  . Smoking status: Never Smoker   . Smokeless tobacco: Not on file  . Alcohol Use: No   OB History   Grav Para Term Preterm Abortions TAB SAB Ect Mult Living   3 3 3       3      Review of Systems  Constitutional: Negative for fever and activity change.  HENT: Positive for ear pain. Negative for congestion, ear discharge, sinus pressure and sore throat.   Eyes: Negative.   Respiratory: Negative.   Cardiovascular: Negative.   Musculoskeletal: Positive for gait problem. Negative for back pain, myalgias and neck pain.       As per HPI  Skin: Negative.  Negative for color change and rash.  Neurological: Negative.     Allergies  Review of patient's allergies indicates no known allergies.  Home Medications   Prior to Admission medications   Medication Sig Start Date End Date Taking? Authorizing Provider  aspirin 81 MG tablet  Take 81 mg by mouth daily.    Historical Provider, MD  baclofen (LIORESAL) 10 MG tablet  10/03/13   Historical Provider, MD  brimonidine (ALPHAGAN) 0.15 % ophthalmic solution 1 drop 3 (three) times daily.    Historical Provider, MD  Butalbital-Acetaminophen 50-300 MG TABS Take by mouth.    Historical Provider, MD  Butalbital-APAP-Caffeine 50-300-40 MG CAPS  08/03/13   Historical Provider, MD  cephALEXin (KEFLEX) 500 MG capsule Take 1 capsule (500 mg total) by mouth 3 (three) times daily. 09/27/13   Max T Hyatt, DPM  citalopram (CELEXA) 20 MG tablet Take 20 mg by mouth daily.    Historical Provider, MD  clindamycin (CLEOCIN) 300 MG capsule Take 1 capsule (300 mg total) by mouth 3 (three) times daily. 09/11/13   Harriet Masson, DPM  dorzolamide-timolol (COSOPT) 22.3-6.8 MG/ML ophthalmic solution  05/14/13   Historical Provider, MD  hydrochlorothiazide (HYDRODIURIL) 25 MG tablet Take 25 mg by mouth daily.    Historical Provider, MD  HYDROcodone-acetaminophen (NORCO) 10-325 MG per tablet Take 1 tablet by mouth every 6 (six) hours as needed. 09/11/13   Harriet Masson, DPM  HYDROcodone-acetaminophen (NORCO) 5-325 MG per tablet Take 1-2 tablets by mouth every 6 (six) hours as needed. MAXIMUM TOTAL ACETAMINOPHEN DOSE IS 4000 MG PER DAY 10/11/13   Max T Hyatt, DPM  HYDROcodone-ibuprofen (VICOPROFEN) 7.5-200 MG  per tablet Take 1 tablet by mouth every 8 (eight) hours as needed for moderate pain or severe pain. 08/08/13   Shelly Bombard, MD  LUMIGAN 0.01 % SOLN  06/19/13   Historical Provider, MD  oxyCODONE-acetaminophen (PERCOCET) 10-325 MG per tablet Take 1 tablet by mouth every 4 (four) hours as needed for pain. 09/27/13   Max T Hyatt, DPM  Polyvinyl Alcohol-Povidone (REFRESH OP) Apply to eye daily.    Historical Provider, MD  promethazine (PHENERGAN) 12.5 MG tablet Take 2 tablets (25 mg total) by mouth every 6 (six) hours as needed for nausea or vomiting. 09/27/13   Max T Hyatt, DPM  zolpidem (AMBIEN) 10 MG tablet Take  10 mg by mouth at bedtime as needed.    Historical Provider, MD  zonisamide (ZONEGRAN) 25 MG capsule  10/03/13   Historical Provider, MD   BP 129/87  Pulse 66  Temp(Src) 97.6 F (36.4 C) (Oral)  Resp 12  SpO2 97% Physical Exam  Nursing note and vitals reviewed. Constitutional: She is oriented to person, place, and time. She appears well-developed and well-nourished. No distress.  HENT:  Bilat TM's obscurred by impacted cerumen.   Eyes: Conjunctivae and EOM are normal.  Neck: Normal range of motion. Neck supple.  Cardiovascular: Normal rate.   Pulmonary/Chest: Effort normal. No respiratory distress.  Musculoskeletal:  No appreciable swelling to the left knee. Tenderness along the joint line. Full extension and flexion to 100 deg.  External torsion produces pain to the lateral aspect. No erythema or deformity. Left great and 2nd toes with mild swelling and bluish discoloration. + tenderness. No deformed. Incisional scar at the base of the great toe s/p remote surgery with IF device.  Pedal pulse 2+  Neurological: She is alert and oriented to person, place, and time.  Skin: Skin is warm and dry.  Psychiatric: She has a normal mood and affect.    ED Course  Procedures (including critical care time) Labs Review Labs Reviewed - No data to display  Imaging Review No results found.   MDM   1. Bilateral hearing loss due to cerumen impaction   2. Fall, initial encounter   3. Knee contusion, left, initial encounter   4. Toe contusion, left, initial encounter      Bilateral ear irrigation Send to main hosp for L LE xrays. Pt st she did not wish to wait any longer to go to the hospital for xrays and left.  A few minutes later the pt returne to go to the hospital for xrays. We are still waiting for results at 1315h. Post irrigation EAC;s clear. Left TM with erythema and EAC irritation. Left knee sleeve. Perform knee flex and ext movements as demo'd. Pt st at discharge that her  knee was not xrayed. Does not have any more time to wait. F/U with PCP as needed or return prn     Janne Napoleon, NP 12/31/13 [place], NP 12/31/13 1330

## 2013-12-31 NOTE — ED Notes (Signed)
Patient talking in lobby

## 2013-12-31 NOTE — ED Notes (Signed)
Patient did leave with adam (reg) for xrays at hospital and has returned.Janne Napoleon, np aware

## 2014-01-01 NOTE — ED Provider Notes (Signed)
Medical screening examination/treatment/procedure(s) were performed by a resident physician or non-physician practitioner and as the supervising physician I was immediately available for consultation/collaboration.  Linna Darner, MD Family Medicine   Waldemar Dickens, MD 01/01/14 [phone]

## 2014-02-11 ENCOUNTER — Encounter (HOSPITAL_COMMUNITY): Payer: Self-pay | Admitting: Emergency Medicine

## 2014-03-07 ENCOUNTER — Emergency Department (HOSPITAL_COMMUNITY)
Admission: EM | Admit: 2014-03-07 | Discharge: 2014-03-08 | Disposition: A | Discharge status: Home / Self Care | Payer: PRIVATE HEALTH INSURANCE | Attending: Emergency Medicine | Admitting: Emergency Medicine

## 2014-03-07 ENCOUNTER — Emergency Department (HOSPITAL_COMMUNITY): Payer: PRIVATE HEALTH INSURANCE

## 2014-03-07 ENCOUNTER — Encounter (HOSPITAL_COMMUNITY): Payer: Self-pay | Admitting: *Deleted

## 2014-03-07 DIAGNOSIS — Z792 Long term (current) use of antibiotics: Secondary | ICD-10-CM | POA: Diagnosis not present

## 2014-03-07 DIAGNOSIS — Z7982 Long term (current) use of aspirin: Secondary | ICD-10-CM | POA: Insufficient documentation

## 2014-03-07 DIAGNOSIS — N3000 Acute cystitis without hematuria: Secondary | ICD-10-CM | POA: Insufficient documentation

## 2014-03-07 DIAGNOSIS — R509 Fever, unspecified: Secondary | ICD-10-CM

## 2014-03-07 DIAGNOSIS — I10 Essential (primary) hypertension: Secondary | ICD-10-CM | POA: Diagnosis not present

## 2014-03-07 DIAGNOSIS — J189 Pneumonia, unspecified organism: Secondary | ICD-10-CM | POA: Diagnosis not present

## 2014-03-07 LAB — URINALYSIS, ROUTINE W REFLEX MICROSCOPIC
Bilirubin Urine: NEGATIVE
Glucose, UA: NEGATIVE mg/dL
Hgb urine dipstick: NEGATIVE
Ketones, ur: NEGATIVE mg/dL
NITRITE: NEGATIVE
Protein, ur: NEGATIVE mg/dL
SPECIFIC GRAVITY, URINE: 1.025 (ref 1.005–1.030)
UROBILINOGEN UA: 4 mg/dL — AB (ref 0.0–1.0)
pH: 6 (ref 5.0–8.0)

## 2014-03-07 LAB — BASIC METABOLIC PANEL
ANION GAP: 16 — AB (ref 5–15)
BUN: 10 mg/dL (ref 6–23)
CHLORIDE: 97 meq/L (ref 96–112)
CO2: 22 meq/L (ref 19–32)
CREATININE: 1.02 mg/dL (ref 0.50–1.10)
Calcium: 9.5 mg/dL (ref 8.4–10.5)
GFR calc non Af Amer: 60 mL/min — ABNORMAL LOW (ref >90)
GFR, EST AFRICAN AMERICAN: 70 mL/min — AB (ref >90)
Glucose, Bld: 104 mg/dL — ABNORMAL HIGH (ref 70–99)
Potassium: 3.4 mEq/L — ABNORMAL LOW (ref 3.7–5.3)
Sodium: 135 mEq/L — ABNORMAL LOW (ref 137–147)

## 2014-03-07 LAB — CBC WITH DIFFERENTIAL/PLATELET
BASOS ABS: 0 10*3/uL (ref 0.0–0.1)
Basophils Relative: 0 % (ref 0–1)
Eosinophils Absolute: 0.1 10*3/uL (ref 0.0–0.7)
Eosinophils Relative: 1 % (ref 0–5)
HEMATOCRIT: 36.3 % (ref 36.0–46.0)
HEMOGLOBIN: 12.5 g/dL (ref 12.0–15.0)
LYMPHS PCT: 26 % (ref 12–46)
Lymphs Abs: 1.9 10*3/uL (ref 0.7–4.0)
MCH: 29.7 pg (ref 26.0–34.0)
MCHC: 34.4 g/dL (ref 30.0–36.0)
MCV: 86.2 fL (ref 78.0–100.0)
MONO ABS: 0.8 10*3/uL (ref 0.1–1.0)
MONOS PCT: 10 % (ref 3–12)
Neutro Abs: 4.6 10*3/uL (ref 1.7–7.7)
Neutrophils Relative %: 63 % (ref 43–77)
Platelets: 263 10*3/uL (ref 150–400)
RBC: 4.21 MIL/uL (ref 3.87–5.11)
RDW: 12.2 % (ref 11.5–15.5)
WBC: 7.4 10*3/uL (ref 4.0–10.5)

## 2014-03-07 LAB — URINE MICROSCOPIC-ADD ON

## 2014-03-07 MED ORDER — KETOROLAC TROMETHAMINE 30 MG/ML IJ SOLN
30.0000 mg | Freq: Once | INTRAMUSCULAR | Status: AC
  Administered 2014-03-07: 30 mg via INTRAVENOUS
  Filled 2014-03-07: qty 1

## 2014-03-07 MED ORDER — SODIUM CHLORIDE 0.9 % IV BOLUS (SEPSIS)
1000.0000 mL | Freq: Once | INTRAVENOUS | Status: AC
  Administered 2014-03-07: 1000 mL via INTRAVENOUS

## 2014-03-07 MED ORDER — AZITHROMYCIN 250 MG PO TABS
500.0000 mg | ORAL_TABLET | Freq: Once | ORAL | Status: AC
  Administered 2014-03-07: 500 mg via ORAL
  Filled 2014-03-07: qty 2

## 2014-03-07 NOTE — ED Notes (Signed)
thge pt is c/o a fever body aches chills sore throat for 4 days

## 2014-03-07 NOTE — ED Provider Notes (Signed)
CSN: [id number]     Arrival date & time 03/07/14  2115 History   First MD Initiated Contact with Patient 03/07/14 2121     Chief Complaint  Patient presents with  . Fever     (Consider location/radiation/quality/duration/timing/severity/associated sxs/prior Treatment)  HPI Comments: Patient presents with complaint of one-week history of cough, sore throat, vomiting, and fever to 100.89F. Patient took Mucinex recently but has not been taking other medications. She states that she has been vomiting 1 time per day. She has chest pain across the middle of her chest which is worse with coughing. One episode of diarrhea during the week. No urinary symptoms. No back pain. No congestion or rash. No recent travel. She has a grandson who is sick at home.  The history is provided by the patient.    Past Medical History  Diagnosis Date  . Hypertension   . Atypical chest pain 03/13/2013  . Abnormal ECG 03/13/2013    TWI diffuse. Was seen prior to cath in 2007. No CAD.   Past Surgical History  Procedure Laterality Date  . Foot surgery     Family History  Problem Relation Age of Onset  . Hypertension Mother    History  Substance Use Topics  . Smoking status: Never Smoker   . Smokeless tobacco: Not on file  . Alcohol Use: No   OB History    Gravida Para Term Preterm AB TAB SAB Ectopic Multiple Living   3 3 3       3      Review of Systems  Constitutional: Positive for fever, chills, activity change and fatigue.  HENT: Positive for sore throat. Negative for congestion, ear pain, rhinorrhea and sinus pressure.   Eyes: Negative for redness.  Respiratory: Positive for cough. Negative for shortness of breath and wheezing.   Cardiovascular: Negative for chest pain and leg swelling.  Gastrointestinal: Positive for nausea and vomiting. Negative for abdominal pain and diarrhea.  Genitourinary: Negative for dysuria.  Musculoskeletal: Positive for myalgias. Negative for neck stiffness.  Skin:  Negative for rash.  Neurological: Negative for headaches.  Hematological: Negative for adenopathy.    Allergies  Review of patient's allergies indicates no known allergies.  Home Medications   Prior to Admission medications   Medication Sig Start Date End Date Taking? Authorizing Provider  aspirin 81 MG tablet Take 81 mg by mouth daily.   Yes Historical Provider, MD  baclofen (LIORESAL) 10 MG tablet  10/03/13  Yes Historical Provider, MD  brimonidine (ALPHAGAN) 0.15 % ophthalmic solution 1 drop 3 (three) times daily.   Yes Historical Provider, MD  citalopram (CELEXA) 20 MG tablet Take 20 mg by mouth daily.   Yes Historical Provider, MD  dorzolamide-timolol (COSOPT) 22.3-6.8 MG/ML ophthalmic solution  05/14/13  Yes Historical Provider, MD  ferrous sulfate 325 (65 FE) MG tablet Take 325 mg by mouth daily with breakfast.   Yes Historical Provider, MD  hydrochlorothiazide (HYDRODIURIL) 25 MG tablet Take 25 mg by mouth daily.   Yes Historical Provider, MD  LUMIGAN 0.01 % SOLN  06/19/13  Yes Historical Provider, MD  Polyvinyl Alcohol-Povidone (REFRESH OP) Apply to eye daily.   Yes Historical Provider, MD  potassium chloride SA (K-DUR,KLOR-CON) 20 MEQ tablet Take 20 mEq by mouth daily.   Yes Historical Provider, MD  Butalbital-APAP-Caffeine 50-300-40 MG CAPS  08/03/13   Historical Provider, MD  cephALEXin (KEFLEX) 500 MG capsule Take 1 capsule (500 mg total) by mouth 3 (three) times daily. 09/27/13   Max T  Hyatt, DPM   BP 132/70 mmHg  Pulse 91  Temp(Src) 98.7 F (37.1 C) (Oral)  Resp 18  SpO2 96%   Physical Exam  Constitutional: She appears well-developed and well-nourished.  HENT:  Head: Normocephalic and atraumatic.  Right Ear: Tympanic membrane, external ear and ear canal normal.  Left Ear: Tympanic membrane, external ear and ear canal normal.  Nose: Nose normal. No mucosal edema or rhinorrhea.  Mouth/Throat: Uvula is midline, oropharynx is clear and moist and mucous membranes are  normal. Mucous membranes are not dry. No oral lesions. No trismus in the jaw. No uvula swelling. No oropharyngeal exudate, posterior oropharyngeal edema, posterior oropharyngeal erythema or tonsillar abscesses.  Eyes: Conjunctivae are normal. Right eye exhibits no discharge. Left eye exhibits no discharge.  Neck: Normal range of motion. Neck supple.  Cardiovascular: Normal rate, regular rhythm and normal heart sounds.   No murmur heard. Pulmonary/Chest: Effort normal. No respiratory distress. She has no wheezes. She has rales (bases, left greater than right).  Abdominal: Soft. There is no tenderness. There is no rebound and no guarding.  Musculoskeletal: She exhibits no edema or tenderness.  Lymphadenopathy:    She has no cervical adenopathy.  Neurological: She is alert.  Skin: Skin is warm and dry.  Psychiatric: She has a normal mood and affect.  Nursing note and vitals reviewed.   ED Course  Procedures (including critical care time) Labs Review Labs Reviewed  BASIC METABOLIC PANEL - Abnormal; Notable for the following:    Sodium 135 (*)    Potassium 3.4 (*)    Glucose, Bld 104 (*)    GFR calc non Af Amer 60 (*)    GFR calc Af Amer 70 (*)    Anion gap 16 (*)    All other components within normal limits  URINALYSIS, ROUTINE W REFLEX MICROSCOPIC - Abnormal; Notable for the following:    Color, Urine AMBER (*)    APPearance CLOUDY (*)    Urobilinogen, UA 4.0 (*)    Leukocytes, UA LARGE (*)    All other components within normal limits  URINE MICROSCOPIC-ADD ON - Abnormal; Notable for the following:    Squamous Epithelial / LPF FEW (*)    Bacteria, UA MANY (*)    All other components within normal limits  CBC WITH DIFFERENTIAL    Imaging Review Dg Chest 2 View  03/07/2014   CLINICAL DATA:  Shortness of breath with fever and chest pain. Cough.  EXAM: CHEST  2 VIEW  COMPARISON:  10/19/2005  FINDINGS: There is an infiltrate within the right lower lung consistent with pneumonia  given the clinical history. No pleural effusion. Normal heart size and mediastinal contours.  IMPRESSION: Right basilar pneumonia.   Electronically Signed   By: Jorje Guild M.D.   On: 03/07/2014 22:52     EKG Interpretation None       10:03 PM Patient seen and examined. Work-up initiated. Medications ordered.   Vital signs reviewed and are as follows: BP 132/70 mmHg  Pulse 91  Temp(Src) 98.7 F (37.1 C) (Oral)  Resp 18  SpO2 96%  12:38 AM Pt informed of results. Patient was given first dose of azithromycin in the emergency department. Will discharge home on azithromycin. Upon further investigation, patient has had some mild dysuria. Given UA findings will give nitrofurantoin for UTI.  Patient urged to return with worsening symptoms including high persistent fever, worsening trouble breathing or increased work of breathing or other concerns. Patient verbalized understanding and agrees with  plan.    MDM   Final diagnoses:  Fever  Community acquired pneumonia  Acute cystitis without hematuria   CAP: Patient started on azithromycin. Patient is also on Celexa. She has not had prolonged QTC in the past. She will complete 4 additional days of this antibiotic. Vital signs are stable. She is not in any respiratory distress. Her oxygen saturation is normal. No indications for inpatient treatment at this point. Do not suspect PE.  Acute cystitis: Treat with nitrofurantoin. No concern for pyelonephritis.    Carlisle Cater, PA-C 03/08/14 [place], DO 03/08/14 1538

## 2014-03-08 MED ORDER — AZITHROMYCIN 250 MG PO TABS: 250.0000 mg | ORAL_TABLET | Freq: Every day | ORAL | Status: DC

## 2014-03-08 MED ORDER — NITROFURANTOIN MONOHYD MACRO 100 MG PO CAPS: 100.0000 mg | ORAL_CAPSULE | Freq: Two times a day (BID) | ORAL | Status: DC

## 2014-03-08 MED ORDER — BENZONATATE 100 MG PO CAPS: 100.0000 mg | ORAL_CAPSULE | Freq: Three times a day (TID) | ORAL | Status: DC

## 2014-03-08 NOTE — Discharge Instructions (Signed)
Please read and follow all provided instructions.  Your diagnoses today include:  1. Community acquired pneumonia   2. Fever   3. Acute cystitis without hematuria     Tests performed today include:  Blood counts and electrolytes  Chest x-ray -- shows pneumonia  Urine test - shows possible urinary tract infection  Vital signs. See below for your results today.   Medications prescribed:   Azithromycin - antibiotic for respiratory infection  You have been prescribed an antibiotic medicine: take the entire course of medicine even if you are feeling better. Stopping early can cause the antibiotic not to work.   Macrobid - antibiotic for urinary tract infection  You have been prescribed an antibiotic medicine: take the entire course of medicine even if you are feeling better. Stopping early can cause the antibiotic not to work.   Tessalon Perles - cough suppressant medication  Take any prescribed medications only as directed.  Home care instructions:  Follow any educational materials contained in this packet.  Take the complete course of antibiotics that you were prescribed.   BE VERY CAREFUL not to take multiple medicines containing Tylenol (also called acetaminophen). Doing so can lead to an overdose which can damage your liver and cause liver failure and possibly death.   Follow-up instructions: Please follow-up with your primary care provider in the next 3 days for further evaluation of your symptoms and to ensure resolution of your infection.   Return instructions:   Please return to the Emergency Department if you experience worsening symptoms.   Return immediately with worsening breathing, worsening shortness of breath, or if you feel it is taking you more effort to breathe.   Please return if you have any other emergent concerns.  Additional Information:  Your vital signs today were: BP 123/70 mmHg   Pulse 90   Temp(Src) 98.7 F (37.1 C) (Oral)   Resp 27    SpO2 98% If your blood pressure (BP) was elevated above 135/85 this visit, please have this repeated by your doctor within one month. --------------

## 2014-04-23 DIAGNOSIS — R51 Headache: Secondary | ICD-10-CM | POA: Diagnosis not present

## 2014-04-23 DIAGNOSIS — M791 Myalgia: Secondary | ICD-10-CM | POA: Diagnosis not present

## 2014-04-23 DIAGNOSIS — M542 Cervicalgia: Secondary | ICD-10-CM | POA: Diagnosis not present

## 2014-04-23 DIAGNOSIS — G43719 Chronic migraine without aura, intractable, without status migrainosus: Secondary | ICD-10-CM | POA: Diagnosis not present

## 2014-05-20 DIAGNOSIS — M2242 Chondromalacia patellae, left knee: Secondary | ICD-10-CM | POA: Diagnosis not present

## 2014-05-21 ENCOUNTER — Ambulatory Visit
Admission: RE | Admit: 2014-05-21 | Discharge: 2014-05-21 | Disposition: A | Discharge status: Home / Self Care | Payer: 59 | Source: Ambulatory Visit | Attending: Orthopedic Surgery | Admitting: Orthopedic Surgery

## 2014-05-21 ENCOUNTER — Other Ambulatory Visit: Payer: Self-pay | Admitting: Orthopedic Surgery

## 2014-05-21 DIAGNOSIS — M94262 Chondromalacia, left knee: Secondary | ICD-10-CM

## 2014-05-21 DIAGNOSIS — M25562 Pain in left knee: Secondary | ICD-10-CM | POA: Diagnosis not present

## 2014-05-22 DIAGNOSIS — H4011X3 Primary open-angle glaucoma, severe stage: Secondary | ICD-10-CM | POA: Diagnosis not present

## 2014-05-22 DIAGNOSIS — Z961 Presence of intraocular lens: Secondary | ICD-10-CM | POA: Diagnosis not present

## 2014-05-30 DIAGNOSIS — I10 Essential (primary) hypertension: Secondary | ICD-10-CM | POA: Diagnosis not present

## 2014-05-31 DIAGNOSIS — I10 Essential (primary) hypertension: Secondary | ICD-10-CM | POA: Diagnosis not present

## 2014-06-03 DIAGNOSIS — I10 Essential (primary) hypertension: Secondary | ICD-10-CM | POA: Diagnosis not present

## 2014-06-11 DIAGNOSIS — I10 Essential (primary) hypertension: Secondary | ICD-10-CM | POA: Diagnosis not present

## 2014-06-12 DIAGNOSIS — I10 Essential (primary) hypertension: Secondary | ICD-10-CM | POA: Diagnosis not present

## 2014-06-13 DIAGNOSIS — E1149 Type 2 diabetes mellitus with other diabetic neurological complication: Secondary | ICD-10-CM | POA: Diagnosis not present

## 2014-06-13 DIAGNOSIS — F4323 Adjustment disorder with mixed anxiety and depressed mood: Secondary | ICD-10-CM | POA: Diagnosis not present

## 2014-06-13 DIAGNOSIS — I10 Essential (primary) hypertension: Secondary | ICD-10-CM | POA: Diagnosis not present

## 2014-06-14 DIAGNOSIS — M2242 Chondromalacia patellae, left knee: Secondary | ICD-10-CM | POA: Diagnosis not present

## 2014-06-14 DIAGNOSIS — I10 Essential (primary) hypertension: Secondary | ICD-10-CM | POA: Diagnosis not present

## 2014-06-17 DIAGNOSIS — I10 Essential (primary) hypertension: Secondary | ICD-10-CM | POA: Diagnosis not present

## 2014-06-18 DIAGNOSIS — I10 Essential (primary) hypertension: Secondary | ICD-10-CM | POA: Diagnosis not present

## 2014-06-19 DIAGNOSIS — I10 Essential (primary) hypertension: Secondary | ICD-10-CM | POA: Diagnosis not present

## 2014-06-20 DIAGNOSIS — I10 Essential (primary) hypertension: Secondary | ICD-10-CM | POA: Diagnosis not present

## 2014-06-21 DIAGNOSIS — I10 Essential (primary) hypertension: Secondary | ICD-10-CM | POA: Diagnosis not present

## 2014-06-24 DIAGNOSIS — I10 Essential (primary) hypertension: Secondary | ICD-10-CM | POA: Diagnosis not present

## 2014-06-25 DIAGNOSIS — I10 Essential (primary) hypertension: Secondary | ICD-10-CM | POA: Diagnosis not present

## 2014-06-26 DIAGNOSIS — I10 Essential (primary) hypertension: Secondary | ICD-10-CM | POA: Diagnosis not present

## 2014-06-27 DIAGNOSIS — I10 Essential (primary) hypertension: Secondary | ICD-10-CM | POA: Diagnosis not present

## 2014-06-28 ENCOUNTER — Other Ambulatory Visit (HOSPITAL_COMMUNITY): Payer: Self-pay | Admitting: Family Medicine

## 2014-06-28 DIAGNOSIS — I10 Essential (primary) hypertension: Secondary | ICD-10-CM | POA: Diagnosis not present

## 2014-06-28 DIAGNOSIS — Z1231 Encounter for screening mammogram for malignant neoplasm of breast: Secondary | ICD-10-CM

## 2014-07-01 DIAGNOSIS — I10 Essential (primary) hypertension: Secondary | ICD-10-CM | POA: Diagnosis not present

## 2014-07-02 DIAGNOSIS — I10 Essential (primary) hypertension: Secondary | ICD-10-CM | POA: Diagnosis not present

## 2014-07-03 DIAGNOSIS — I10 Essential (primary) hypertension: Secondary | ICD-10-CM | POA: Diagnosis not present

## 2014-07-04 DIAGNOSIS — I10 Essential (primary) hypertension: Secondary | ICD-10-CM | POA: Diagnosis not present

## 2014-07-08 DIAGNOSIS — I10 Essential (primary) hypertension: Secondary | ICD-10-CM | POA: Diagnosis not present

## 2014-07-09 DIAGNOSIS — I10 Essential (primary) hypertension: Secondary | ICD-10-CM | POA: Diagnosis not present

## 2014-07-10 DIAGNOSIS — I10 Essential (primary) hypertension: Secondary | ICD-10-CM | POA: Diagnosis not present

## 2014-07-11 DIAGNOSIS — I10 Essential (primary) hypertension: Secondary | ICD-10-CM | POA: Diagnosis not present

## 2014-07-12 DIAGNOSIS — I10 Essential (primary) hypertension: Secondary | ICD-10-CM | POA: Diagnosis not present

## 2014-07-15 DIAGNOSIS — I10 Essential (primary) hypertension: Secondary | ICD-10-CM | POA: Diagnosis not present

## 2014-07-16 DIAGNOSIS — I10 Essential (primary) hypertension: Secondary | ICD-10-CM | POA: Diagnosis not present

## 2014-07-17 DIAGNOSIS — I10 Essential (primary) hypertension: Secondary | ICD-10-CM | POA: Diagnosis not present

## 2014-07-18 DIAGNOSIS — I10 Essential (primary) hypertension: Secondary | ICD-10-CM | POA: Diagnosis not present

## 2014-07-19 DIAGNOSIS — I10 Essential (primary) hypertension: Secondary | ICD-10-CM | POA: Diagnosis not present

## 2014-07-22 ENCOUNTER — Ambulatory Visit (HOSPITAL_COMMUNITY)
Admission: RE | Admit: 2014-07-22 | Discharge: 2014-07-22 | Disposition: A | Discharge status: Home / Self Care | Payer: 59 | Source: Ambulatory Visit | Attending: Family Medicine | Admitting: Family Medicine

## 2014-07-22 DIAGNOSIS — Z1231 Encounter for screening mammogram for malignant neoplasm of breast: Secondary | ICD-10-CM

## 2014-07-22 DIAGNOSIS — I10 Essential (primary) hypertension: Secondary | ICD-10-CM | POA: Diagnosis not present

## 2014-07-23 DIAGNOSIS — M542 Cervicalgia: Secondary | ICD-10-CM | POA: Diagnosis not present

## 2014-07-23 DIAGNOSIS — G43719 Chronic migraine without aura, intractable, without status migrainosus: Secondary | ICD-10-CM | POA: Diagnosis not present

## 2014-07-23 DIAGNOSIS — R51 Headache: Secondary | ICD-10-CM | POA: Diagnosis not present

## 2014-07-23 DIAGNOSIS — I10 Essential (primary) hypertension: Secondary | ICD-10-CM | POA: Diagnosis not present

## 2014-07-23 DIAGNOSIS — M791 Myalgia: Secondary | ICD-10-CM | POA: Diagnosis not present

## 2014-07-24 DIAGNOSIS — Z0000 Encounter for general adult medical examination without abnormal findings: Secondary | ICD-10-CM | POA: Diagnosis not present

## 2014-07-24 DIAGNOSIS — I10 Essential (primary) hypertension: Secondary | ICD-10-CM | POA: Diagnosis not present

## 2014-07-25 DIAGNOSIS — I10 Essential (primary) hypertension: Secondary | ICD-10-CM | POA: Diagnosis not present

## 2014-07-26 DIAGNOSIS — M2242 Chondromalacia patellae, left knee: Secondary | ICD-10-CM | POA: Diagnosis not present

## 2014-07-26 DIAGNOSIS — I10 Essential (primary) hypertension: Secondary | ICD-10-CM | POA: Diagnosis not present

## 2014-07-29 DIAGNOSIS — I10 Essential (primary) hypertension: Secondary | ICD-10-CM | POA: Diagnosis not present

## 2014-07-30 DIAGNOSIS — I10 Essential (primary) hypertension: Secondary | ICD-10-CM | POA: Diagnosis not present

## 2014-07-31 DIAGNOSIS — I10 Essential (primary) hypertension: Secondary | ICD-10-CM | POA: Diagnosis not present

## 2014-08-01 DIAGNOSIS — I10 Essential (primary) hypertension: Secondary | ICD-10-CM | POA: Diagnosis not present

## 2014-08-02 DIAGNOSIS — I10 Essential (primary) hypertension: Secondary | ICD-10-CM | POA: Diagnosis not present

## 2014-08-05 DIAGNOSIS — I10 Essential (primary) hypertension: Secondary | ICD-10-CM | POA: Diagnosis not present

## 2014-08-06 DIAGNOSIS — I10 Essential (primary) hypertension: Secondary | ICD-10-CM | POA: Diagnosis not present

## 2014-08-07 DIAGNOSIS — I10 Essential (primary) hypertension: Secondary | ICD-10-CM | POA: Diagnosis not present

## 2014-08-08 DIAGNOSIS — I10 Essential (primary) hypertension: Secondary | ICD-10-CM | POA: Diagnosis not present

## 2014-08-09 DIAGNOSIS — I10 Essential (primary) hypertension: Secondary | ICD-10-CM | POA: Diagnosis not present

## 2014-08-12 DIAGNOSIS — I10 Essential (primary) hypertension: Secondary | ICD-10-CM | POA: Diagnosis not present

## 2014-08-13 DIAGNOSIS — I10 Essential (primary) hypertension: Secondary | ICD-10-CM | POA: Diagnosis not present

## 2014-08-14 DIAGNOSIS — I10 Essential (primary) hypertension: Secondary | ICD-10-CM | POA: Diagnosis not present

## 2014-08-15 DIAGNOSIS — I10 Essential (primary) hypertension: Secondary | ICD-10-CM | POA: Diagnosis not present

## 2014-08-16 DIAGNOSIS — I10 Essential (primary) hypertension: Secondary | ICD-10-CM | POA: Diagnosis not present

## 2014-08-19 DIAGNOSIS — I10 Essential (primary) hypertension: Secondary | ICD-10-CM | POA: Diagnosis not present

## 2014-08-20 DIAGNOSIS — I10 Essential (primary) hypertension: Secondary | ICD-10-CM | POA: Diagnosis not present

## 2014-08-21 DIAGNOSIS — I10 Essential (primary) hypertension: Secondary | ICD-10-CM | POA: Diagnosis not present

## 2014-08-22 DIAGNOSIS — I10 Essential (primary) hypertension: Secondary | ICD-10-CM | POA: Diagnosis not present

## 2014-08-23 ENCOUNTER — Encounter: Payer: Self-pay | Admitting: Gastroenterology

## 2014-08-23 DIAGNOSIS — I10 Essential (primary) hypertension: Secondary | ICD-10-CM | POA: Diagnosis not present

## 2014-08-26 DIAGNOSIS — I10 Essential (primary) hypertension: Secondary | ICD-10-CM | POA: Diagnosis not present

## 2014-08-27 DIAGNOSIS — I10 Essential (primary) hypertension: Secondary | ICD-10-CM | POA: Diagnosis not present

## 2014-08-28 DIAGNOSIS — I10 Essential (primary) hypertension: Secondary | ICD-10-CM | POA: Diagnosis not present

## 2014-08-29 DIAGNOSIS — I10 Essential (primary) hypertension: Secondary | ICD-10-CM | POA: Diagnosis not present

## 2014-08-30 DIAGNOSIS — I10 Essential (primary) hypertension: Secondary | ICD-10-CM | POA: Diagnosis not present

## 2014-09-02 DIAGNOSIS — I10 Essential (primary) hypertension: Secondary | ICD-10-CM | POA: Diagnosis not present

## 2014-09-03 DIAGNOSIS — I10 Essential (primary) hypertension: Secondary | ICD-10-CM | POA: Diagnosis not present

## 2014-09-04 DIAGNOSIS — I10 Essential (primary) hypertension: Secondary | ICD-10-CM | POA: Diagnosis not present

## 2014-09-05 DIAGNOSIS — I10 Essential (primary) hypertension: Secondary | ICD-10-CM | POA: Diagnosis not present

## 2014-09-06 ENCOUNTER — Other Ambulatory Visit: Payer: Self-pay | Admitting: Orthopedic Surgery

## 2014-09-06 DIAGNOSIS — I10 Essential (primary) hypertension: Secondary | ICD-10-CM | POA: Diagnosis not present

## 2014-09-06 DIAGNOSIS — M2392 Unspecified internal derangement of left knee: Secondary | ICD-10-CM

## 2014-09-10 DIAGNOSIS — I10 Essential (primary) hypertension: Secondary | ICD-10-CM | POA: Diagnosis not present

## 2014-09-11 DIAGNOSIS — I10 Essential (primary) hypertension: Secondary | ICD-10-CM | POA: Diagnosis not present

## 2014-09-12 DIAGNOSIS — I10 Essential (primary) hypertension: Secondary | ICD-10-CM | POA: Diagnosis not present

## 2014-09-13 DIAGNOSIS — I10 Essential (primary) hypertension: Secondary | ICD-10-CM | POA: Diagnosis not present

## 2014-09-16 DIAGNOSIS — I10 Essential (primary) hypertension: Secondary | ICD-10-CM | POA: Diagnosis not present

## 2014-09-17 DIAGNOSIS — I10 Essential (primary) hypertension: Secondary | ICD-10-CM | POA: Diagnosis not present

## 2014-09-18 DIAGNOSIS — I10 Essential (primary) hypertension: Secondary | ICD-10-CM | POA: Diagnosis not present

## 2014-09-19 ENCOUNTER — Other Ambulatory Visit: Payer: 59

## 2014-09-19 DIAGNOSIS — I10 Essential (primary) hypertension: Secondary | ICD-10-CM | POA: Diagnosis not present

## 2014-09-20 DIAGNOSIS — H4011X3 Primary open-angle glaucoma, severe stage: Secondary | ICD-10-CM | POA: Diagnosis not present

## 2014-09-20 DIAGNOSIS — I10 Essential (primary) hypertension: Secondary | ICD-10-CM | POA: Diagnosis not present

## 2014-09-20 DIAGNOSIS — Z961 Presence of intraocular lens: Secondary | ICD-10-CM | POA: Diagnosis not present

## 2014-09-23 DIAGNOSIS — I10 Essential (primary) hypertension: Secondary | ICD-10-CM | POA: Diagnosis not present

## 2014-09-24 DIAGNOSIS — I10 Essential (primary) hypertension: Secondary | ICD-10-CM | POA: Diagnosis not present

## 2014-09-25 DIAGNOSIS — I10 Essential (primary) hypertension: Secondary | ICD-10-CM | POA: Diagnosis not present

## 2014-09-26 DIAGNOSIS — I10 Essential (primary) hypertension: Secondary | ICD-10-CM | POA: Diagnosis not present

## 2014-09-27 ENCOUNTER — Other Ambulatory Visit: Payer: 59

## 2014-09-27 DIAGNOSIS — I10 Essential (primary) hypertension: Secondary | ICD-10-CM | POA: Diagnosis not present

## 2014-09-30 ENCOUNTER — Ambulatory Visit
Admission: RE | Admit: 2014-09-30 | Discharge: 2014-09-30 | Disposition: A | Discharge status: Home / Self Care | Payer: Medicare Other | Source: Ambulatory Visit | Attending: Orthopedic Surgery | Admitting: Orthopedic Surgery

## 2014-09-30 DIAGNOSIS — M7122 Synovial cyst of popliteal space [Baker], left knee: Secondary | ICD-10-CM | POA: Diagnosis not present

## 2014-09-30 DIAGNOSIS — I10 Essential (primary) hypertension: Secondary | ICD-10-CM | POA: Diagnosis not present

## 2014-09-30 DIAGNOSIS — M25462 Effusion, left knee: Secondary | ICD-10-CM | POA: Diagnosis not present

## 2014-09-30 DIAGNOSIS — M2392 Unspecified internal derangement of left knee: Secondary | ICD-10-CM

## 2014-09-30 DIAGNOSIS — M23222 Derangement of posterior horn of medial meniscus due to old tear or injury, left knee: Secondary | ICD-10-CM | POA: Diagnosis not present

## 2014-10-01 DIAGNOSIS — I10 Essential (primary) hypertension: Secondary | ICD-10-CM | POA: Diagnosis not present

## 2014-10-21 DIAGNOSIS — S83242A Other tear of medial meniscus, current injury, left knee, initial encounter: Secondary | ICD-10-CM | POA: Diagnosis not present

## 2014-10-22 DIAGNOSIS — G43719 Chronic migraine without aura, intractable, without status migrainosus: Secondary | ICD-10-CM | POA: Diagnosis not present

## 2014-11-11 ENCOUNTER — Other Ambulatory Visit: Payer: Medicare Other | Admitting: Family Medicine

## 2014-11-11 ENCOUNTER — Other Ambulatory Visit (HOSPITAL_COMMUNITY)
Admission: RE | Admit: 2014-11-11 | Discharge: 2014-11-11 | Disposition: A | Discharge status: Home / Self Care | Payer: Medicare Other | Source: Ambulatory Visit | Attending: Family Medicine | Admitting: Family Medicine

## 2014-11-11 DIAGNOSIS — I10 Essential (primary) hypertension: Secondary | ICD-10-CM | POA: Diagnosis not present

## 2014-11-11 DIAGNOSIS — E1149 Type 2 diabetes mellitus with other diabetic neurological complication: Secondary | ICD-10-CM | POA: Diagnosis not present

## 2014-11-11 DIAGNOSIS — Z113 Encounter for screening for infections with a predominantly sexual mode of transmission: Secondary | ICD-10-CM | POA: Insufficient documentation

## 2014-11-11 DIAGNOSIS — N760 Acute vaginitis: Secondary | ICD-10-CM | POA: Diagnosis not present

## 2014-11-11 DIAGNOSIS — F4323 Adjustment disorder with mixed anxiety and depressed mood: Secondary | ICD-10-CM | POA: Diagnosis not present

## 2014-11-13 LAB — URINE CYTOLOGY ANCILLARY ONLY
Candida vaginitis: NEGATIVE
Chlamydia: NEGATIVE
NEISSERIA GONORRHEA: NEGATIVE
TRICH (WINDOWPATH): NEGATIVE

## 2014-11-20 DIAGNOSIS — S83242D Other tear of medial meniscus, current injury, left knee, subsequent encounter: Secondary | ICD-10-CM | POA: Diagnosis not present

## 2014-12-11 DIAGNOSIS — S83242D Other tear of medial meniscus, current injury, left knee, subsequent encounter: Secondary | ICD-10-CM | POA: Diagnosis not present

## 2015-01-08 DIAGNOSIS — S83242D Other tear of medial meniscus, current injury, left knee, subsequent encounter: Secondary | ICD-10-CM | POA: Diagnosis not present

## 2015-01-10 DIAGNOSIS — R030 Elevated blood-pressure reading, without diagnosis of hypertension: Secondary | ICD-10-CM | POA: Diagnosis not present

## 2015-01-14 DIAGNOSIS — M25562 Pain in left knee: Secondary | ICD-10-CM | POA: Diagnosis not present

## 2015-01-14 DIAGNOSIS — M25561 Pain in right knee: Secondary | ICD-10-CM | POA: Diagnosis not present

## 2015-01-21 DIAGNOSIS — H401133 Primary open-angle glaucoma, bilateral, severe stage: Secondary | ICD-10-CM | POA: Diagnosis not present

## 2015-01-21 DIAGNOSIS — Z961 Presence of intraocular lens: Secondary | ICD-10-CM | POA: Diagnosis not present

## 2015-01-22 DIAGNOSIS — M791 Myalgia: Secondary | ICD-10-CM | POA: Diagnosis not present

## 2015-01-22 DIAGNOSIS — R51 Headache: Secondary | ICD-10-CM | POA: Diagnosis not present

## 2015-01-22 DIAGNOSIS — G43719 Chronic migraine without aura, intractable, without status migrainosus: Secondary | ICD-10-CM | POA: Diagnosis not present

## 2015-01-22 DIAGNOSIS — M542 Cervicalgia: Secondary | ICD-10-CM | POA: Diagnosis not present

## 2015-01-30 DIAGNOSIS — E1149 Type 2 diabetes mellitus with other diabetic neurological complication: Secondary | ICD-10-CM | POA: Diagnosis not present

## 2015-01-30 DIAGNOSIS — M23201 Derangement of unspecified lateral meniscus due to old tear or injury, left knee: Secondary | ICD-10-CM | POA: Diagnosis not present

## 2015-01-30 DIAGNOSIS — F330 Major depressive disorder, recurrent, mild: Secondary | ICD-10-CM | POA: Diagnosis not present

## 2015-01-30 DIAGNOSIS — I10 Essential (primary) hypertension: Secondary | ICD-10-CM | POA: Diagnosis not present

## 2015-02-16 ENCOUNTER — Encounter (HOSPITAL_COMMUNITY): Payer: Self-pay

## 2015-03-10 DIAGNOSIS — Y929 Unspecified place or not applicable: Secondary | ICD-10-CM | POA: Diagnosis not present

## 2015-03-10 DIAGNOSIS — Y999 Unspecified external cause status: Secondary | ICD-10-CM | POA: Diagnosis not present

## 2015-03-10 DIAGNOSIS — S83232A Complex tear of medial meniscus, current injury, left knee, initial encounter: Secondary | ICD-10-CM | POA: Diagnosis not present

## 2015-03-10 DIAGNOSIS — G8918 Other acute postprocedural pain: Secondary | ICD-10-CM | POA: Diagnosis not present

## 2015-03-10 DIAGNOSIS — S83242D Other tear of medial meniscus, current injury, left knee, subsequent encounter: Secondary | ICD-10-CM | POA: Diagnosis not present

## 2015-03-10 DIAGNOSIS — M94262 Chondromalacia, left knee: Secondary | ICD-10-CM | POA: Diagnosis not present

## 2015-04-21 DIAGNOSIS — M542 Cervicalgia: Secondary | ICD-10-CM | POA: Diagnosis not present

## 2015-04-21 DIAGNOSIS — M791 Myalgia: Secondary | ICD-10-CM | POA: Diagnosis not present

## 2015-04-21 DIAGNOSIS — G43719 Chronic migraine without aura, intractable, without status migrainosus: Secondary | ICD-10-CM | POA: Diagnosis not present

## 2015-04-21 DIAGNOSIS — R51 Headache: Secondary | ICD-10-CM | POA: Diagnosis not present

## 2015-05-06 DIAGNOSIS — R51 Headache: Secondary | ICD-10-CM | POA: Diagnosis not present

## 2015-05-06 DIAGNOSIS — M542 Cervicalgia: Secondary | ICD-10-CM | POA: Diagnosis not present

## 2015-05-06 DIAGNOSIS — M791 Myalgia: Secondary | ICD-10-CM | POA: Diagnosis not present

## 2015-05-06 DIAGNOSIS — G43719 Chronic migraine without aura, intractable, without status migrainosus: Secondary | ICD-10-CM | POA: Diagnosis not present

## 2015-05-20 DIAGNOSIS — R51 Headache: Secondary | ICD-10-CM | POA: Diagnosis not present

## 2015-05-20 DIAGNOSIS — G43719 Chronic migraine without aura, intractable, without status migrainosus: Secondary | ICD-10-CM | POA: Diagnosis not present

## 2015-05-20 DIAGNOSIS — M791 Myalgia: Secondary | ICD-10-CM | POA: Diagnosis not present

## 2015-05-20 DIAGNOSIS — M542 Cervicalgia: Secondary | ICD-10-CM | POA: Diagnosis not present

## 2015-05-30 DIAGNOSIS — Z961 Presence of intraocular lens: Secondary | ICD-10-CM | POA: Diagnosis not present

## 2015-05-30 DIAGNOSIS — H401133 Primary open-angle glaucoma, bilateral, severe stage: Secondary | ICD-10-CM | POA: Diagnosis not present

## 2015-06-02 DIAGNOSIS — G43109 Migraine with aura, not intractable, without status migrainosus: Secondary | ICD-10-CM | POA: Diagnosis not present

## 2015-06-02 DIAGNOSIS — T7840XA Allergy, unspecified, initial encounter: Secondary | ICD-10-CM | POA: Diagnosis not present

## 2015-06-02 DIAGNOSIS — I10 Essential (primary) hypertension: Secondary | ICD-10-CM | POA: Diagnosis not present

## 2015-06-02 DIAGNOSIS — R7309 Other abnormal glucose: Secondary | ICD-10-CM | POA: Diagnosis not present

## 2015-06-05 DIAGNOSIS — M542 Cervicalgia: Secondary | ICD-10-CM | POA: Diagnosis not present

## 2015-06-05 DIAGNOSIS — G43719 Chronic migraine without aura, intractable, without status migrainosus: Secondary | ICD-10-CM | POA: Diagnosis not present

## 2015-06-05 DIAGNOSIS — R51 Headache: Secondary | ICD-10-CM | POA: Diagnosis not present

## 2015-06-05 DIAGNOSIS — M791 Myalgia: Secondary | ICD-10-CM | POA: Diagnosis not present

## 2015-07-03 DIAGNOSIS — G43109 Migraine with aura, not intractable, without status migrainosus: Secondary | ICD-10-CM | POA: Diagnosis not present

## 2015-07-03 DIAGNOSIS — E1149 Type 2 diabetes mellitus with other diabetic neurological complication: Secondary | ICD-10-CM | POA: Diagnosis not present

## 2015-07-03 DIAGNOSIS — I10 Essential (primary) hypertension: Secondary | ICD-10-CM | POA: Diagnosis not present

## 2015-07-29 DIAGNOSIS — M25562 Pain in left knee: Secondary | ICD-10-CM | POA: Diagnosis not present

## 2015-07-29 DIAGNOSIS — S83242D Other tear of medial meniscus, current injury, left knee, subsequent encounter: Secondary | ICD-10-CM | POA: Diagnosis not present

## 2015-08-13 ENCOUNTER — Other Ambulatory Visit: Payer: Self-pay

## 2015-08-13 DIAGNOSIS — Z1231 Encounter for screening mammogram for malignant neoplasm of breast: Secondary | ICD-10-CM

## 2015-08-28 ENCOUNTER — Ambulatory Visit
Admission: RE | Admit: 2015-08-28 | Discharge: 2015-08-28 | Disposition: A | Discharge status: Home / Self Care | Payer: Medicare Other | Source: Ambulatory Visit

## 2015-08-28 DIAGNOSIS — Z1231 Encounter for screening mammogram for malignant neoplasm of breast: Secondary | ICD-10-CM

## 2015-09-02 DIAGNOSIS — M791 Myalgia: Secondary | ICD-10-CM | POA: Diagnosis not present

## 2015-09-02 DIAGNOSIS — R51 Headache: Secondary | ICD-10-CM | POA: Diagnosis not present

## 2015-09-02 DIAGNOSIS — M542 Cervicalgia: Secondary | ICD-10-CM | POA: Diagnosis not present

## 2015-09-02 DIAGNOSIS — G43719 Chronic migraine without aura, intractable, without status migrainosus: Secondary | ICD-10-CM | POA: Diagnosis not present

## 2015-09-30 DIAGNOSIS — M25562 Pain in left knee: Secondary | ICD-10-CM | POA: Diagnosis not present

## 2015-10-02 DIAGNOSIS — M23201 Derangement of unspecified lateral meniscus due to old tear or injury, left knee: Secondary | ICD-10-CM | POA: Diagnosis not present

## 2015-10-02 DIAGNOSIS — G43109 Migraine with aura, not intractable, without status migrainosus: Secondary | ICD-10-CM | POA: Diagnosis not present

## 2015-10-02 DIAGNOSIS — I10 Essential (primary) hypertension: Secondary | ICD-10-CM | POA: Diagnosis not present

## 2015-10-02 DIAGNOSIS — Z961 Presence of intraocular lens: Secondary | ICD-10-CM | POA: Diagnosis not present

## 2015-10-02 DIAGNOSIS — H401133 Primary open-angle glaucoma, bilateral, severe stage: Secondary | ICD-10-CM | POA: Diagnosis not present

## 2015-10-21 DIAGNOSIS — M25562 Pain in left knee: Secondary | ICD-10-CM | POA: Diagnosis not present

## 2015-11-19 DIAGNOSIS — M791 Myalgia: Secondary | ICD-10-CM | POA: Diagnosis not present

## 2015-11-19 DIAGNOSIS — R51 Headache: Secondary | ICD-10-CM | POA: Diagnosis not present

## 2015-11-19 DIAGNOSIS — G43719 Chronic migraine without aura, intractable, without status migrainosus: Secondary | ICD-10-CM | POA: Diagnosis not present

## 2015-11-19 DIAGNOSIS — M542 Cervicalgia: Secondary | ICD-10-CM | POA: Diagnosis not present

## 2015-11-29 ENCOUNTER — Encounter (INDEPENDENT_AMBULATORY_CARE_PROVIDER_SITE_OTHER): Payer: Self-pay

## 2016-01-20 ENCOUNTER — Ambulatory Visit (INDEPENDENT_AMBULATORY_CARE_PROVIDER_SITE_OTHER): Payer: Medicare Other | Admitting: Orthopaedic Surgery

## 2016-01-20 DIAGNOSIS — M25562 Pain in left knee: Secondary | ICD-10-CM

## 2016-01-30 DIAGNOSIS — Z23 Encounter for immunization: Secondary | ICD-10-CM | POA: Diagnosis not present

## 2016-01-30 DIAGNOSIS — I10 Essential (primary) hypertension: Secondary | ICD-10-CM | POA: Diagnosis not present

## 2016-01-30 DIAGNOSIS — G43109 Migraine with aura, not intractable, without status migrainosus: Secondary | ICD-10-CM | POA: Diagnosis not present

## 2016-01-30 DIAGNOSIS — M23201 Derangement of unspecified lateral meniscus due to old tear or injury, left knee: Secondary | ICD-10-CM | POA: Diagnosis not present

## 2016-01-30 DIAGNOSIS — E1149 Type 2 diabetes mellitus with other diabetic neurological complication: Secondary | ICD-10-CM | POA: Diagnosis not present

## 2016-02-05 DIAGNOSIS — H401133 Primary open-angle glaucoma, bilateral, severe stage: Secondary | ICD-10-CM | POA: Diagnosis not present

## 2016-02-25 ENCOUNTER — Encounter (INDEPENDENT_AMBULATORY_CARE_PROVIDER_SITE_OTHER): Payer: Self-pay | Admitting: Orthopaedic Surgery

## 2016-02-25 ENCOUNTER — Ambulatory Visit (INDEPENDENT_AMBULATORY_CARE_PROVIDER_SITE_OTHER): Payer: Medicare Other | Admitting: Orthopaedic Surgery

## 2016-02-25 VITALS — BP 130/83 | HR 50 | Ht 62.0 in | Wt 176.0 lb

## 2016-02-25 DIAGNOSIS — M1712 Unilateral primary osteoarthritis, left knee: Secondary | ICD-10-CM | POA: Diagnosis not present

## 2016-02-25 MED ORDER — METHYLPREDNISOLONE ACETATE 40 MG/ML IJ SUSP
40.0000 mg | INTRAMUSCULAR | Status: AC | PRN
  Administered 2016-02-25: 40 mg via INTRA_ARTICULAR

## 2016-02-25 MED ORDER — LIDOCAINE HCL 1 % IJ SOLN
3.0000 mL | INTRAMUSCULAR | Status: AC | PRN
  Administered 2016-02-25: 3 mL

## 2016-02-25 NOTE — Progress Notes (Signed)
Office Visit Note   Patient: Alexis Ball           Date of Birth: 10/28/1955           MRN: [id number] Visit Date: 02/25/2016              Requested by: Lucianne Lei, MD [address] [place], [place] [address] PCP: Elyn Peers, MD   Assessment & Plan: Visit Diagnoses:  1. Arthritis of left knee     Plan: Patient again understands that ultimately it will come down to her needing definitive treatment with left total knee replacement. She voices understanding and would like to continue conservative management for now. After patient consent left knee was prepped with Betadine and after using 3 mL 1% Xylocaine for local anesthetic intra-articular Marcaine/Depo-Medrol was done. She'll follow up in office in a few months for recheck. States that she would not have any assistance at home if she does have total knee replacement. Advised patient that the could plan for her to go to a skilled facility for rehabilitation. Information for [facility] given and recommended that she take a tour of the facility.  Follow-Up Instructions: Return in about 4 months (around 06/24/2016).   Orders:  Orders Placed This Encounter  Procedures  . Large Joint Injection/Arthrocentesis   No orders of the defined types were placed in this encounter.     Procedures: Large Joint Inj Date/Time: 02/25/2016 12:11 PM Performed by: [name] Authorized by: [name]   Consent Given by:  Patient Timeout: prior to procedure the correct patient, procedure, and site was verified   Indications:  Pain and joint swelling Location:  Knee Site:  L knee Needle Size:  25 G Needle Length:  1.5 inches Approach:  Anterolateral Ultrasound Guidance: No   Fluoroscopic Guidance: No   Arthrogram: No   Medications:  3 mL lidocaine 1 %; 40 mg methylPREDNISolone acetate 40 MG/ML Aspiration Attempted: No   Patient tolerance:  Patient tolerated the procedure well with no immediate  complications     Clinical Data: No additional findings.   Subjective: Chief Complaint  Patient presents with  . Left Knee - Pain    Patient states she is returning today for left knee injection. States she is having a lot of trouble with her knee swelling and popping out of joint. Patient states she has a lot of stiffness.     Review of Systems  Constitutional: Negative.   HENT: Negative.   Respiratory: Negative.   Cardiovascular: Negative.   Gastrointestinal: Negative.   Genitourinary: Negative.   Musculoskeletal: Negative.   Skin: Negative.   Neurological: Negative.   Hematological: Negative.   Psychiatric/Behavioral: Negative.      Objective: Vital Signs: BP 130/83   Pulse (!) 50   Ht 5\' 2"  (1.575 m)   Wt 176 lb (79.8 kg)   BMI 32.19 kg/m   Physical Exam  Constitutional: She is oriented to person, place, and time. No distress.  HENT:  Head: Normocephalic.  Eyes: Pupils are equal, round, and reactive to light.  Abdominal: She exhibits no distension.  Neurological: She is alert and oriented to person, place, and time.  Skin: Skin is warm and dry.    Ortho Exam Left knee positive crepitus. Good range of motion. Joint line is tender. Does have some knee swelling with small effusion. Calf Nontender and she is neurovascularly intact. Specialty Comments:  No specialty comments available.  Imaging: No results found.  PMFS History: Patient Active Problem List   Diagnosis Date Noted  . Arthritis of left knee 02/25/2016  . Vaginitis and vulvovaginitis, unspecified 08/08/2013  . Leiomyoma of uterus, unspecified 08/08/2013  . Atypical chest pain 03/13/2013  . Abnormal ECG 03/13/2013  . Obesity, unspecified 03/13/2013  . Hypertension    Past Medical History:  Diagnosis Date  . Abnormal ECG 03/13/2013   TWI diffuse. Was seen prior to cath in 2007. No CAD.  Marland Kitchen Atypical chest pain 03/13/2013  . Hypertension     Family History  Problem Relation Age of  Onset  . Hypertension Mother     Past Surgical History:  Procedure Laterality Date  . FOOT SURGERY     Social History   Occupational History  . Not on file.   Social History Main Topics  . Smoking status: Never Smoker  . Smokeless tobacco: Not on file  . Alcohol use No  . Drug use: Unknown  . Sexual activity: Yes    Birth control/ protection: Post-menopausal

## 2016-03-15 DIAGNOSIS — Z961 Presence of intraocular lens: Secondary | ICD-10-CM | POA: Diagnosis not present

## 2016-03-15 DIAGNOSIS — H401133 Primary open-angle glaucoma, bilateral, severe stage: Secondary | ICD-10-CM | POA: Diagnosis not present

## 2016-03-26 ENCOUNTER — Ambulatory Visit (INDEPENDENT_AMBULATORY_CARE_PROVIDER_SITE_OTHER): Payer: Medicare Other

## 2016-03-26 ENCOUNTER — Encounter (INDEPENDENT_AMBULATORY_CARE_PROVIDER_SITE_OTHER): Payer: Self-pay | Admitting: Orthopaedic Surgery

## 2016-03-26 ENCOUNTER — Ambulatory Visit (INDEPENDENT_AMBULATORY_CARE_PROVIDER_SITE_OTHER): Payer: Medicare Other | Admitting: Orthopaedic Surgery

## 2016-03-26 VITALS — BP 140/90 | HR 75 | Ht 62.0 in | Wt 176.0 lb

## 2016-03-26 DIAGNOSIS — M1712 Unilateral primary osteoarthritis, left knee: Secondary | ICD-10-CM

## 2016-03-26 NOTE — Progress Notes (Signed)
Office Visit Note   Patient: Alexis Ball           Date of Birth: 11-06-1955           MRN: [id number] Visit Date: 03/26/2016              Requested by: Lucianne Lei, MD [address] [place], [place] [address] PCP: Elyn Peers, MD   Assessment & Plan: Visit Diagnoses:  1. Arthritis of knee, left   2. Unilateral primary osteoarthritis, left knee     Plan: Injection performed which patient tolerated well. We discussed primary knee osteoarthritis. She will return in 3 months she will call if she has increased symptoms. She has primary knee osteoarthritis most severe in both medial compartments right and left knee.  Follow-Up Instructions: Return in about 3 months (around 06/24/2016).   Orders:  Orders Placed This Encounter  Procedures  . Large Joint Injection/Arthrocentesis  . XR Knee 1-2 Views Left   No orders of the defined types were placed in this encounter.     Procedures: Large Joint Inj Date/Time: 03/26/2016 4:38 PM Performed by: Marybelle Killings Authorized by: Marybelle Killings   Consent Given by:  Patient Site marked: the procedure site was marked   Indications:  Pain and joint swelling Location:  Knee Site:  L knee Needle Size:  22 G Needle Length:  1.5 inches Approach:  Anterolateral Ultrasound Guidance: No   Fluoroscopic Guidance: No   Arthrogram: No   Medications:  1 mL lidocaine 1 %; 40 mg methylPREDNISolone acetate 40 MG/ML; 0.66 mL bupivacaine 0.25 % Aspiration Attempted: No   Patient tolerance:  Patient tolerated the procedure well with no immediate complications     Clinical Data: No additional findings.   Subjective: Chief Complaint  Patient presents with  . Left Knee - Follow-up    Patient states left knee giving her some problems, having some swelling.  Wearing a knee brace.  States last injection helped, but left knee is popping and grinding.  Is knee arthroscopy 2016 showed grade 3 and multiple areas of grade 4  chondromalacia.  Review of Systems  Constitutional: Negative for chills and diaphoresis.  HENT: Negative for ear discharge, ear pain and nosebleeds.   Eyes: Negative for discharge and visual disturbance.  Respiratory: Negative for cough, choking and shortness of breath.   Cardiovascular: Negative for chest pain and palpitations.  Gastrointestinal: Negative for abdominal distention and abdominal pain.  Endocrine: Negative for cold intolerance and heat intolerance.  Genitourinary: Negative for flank pain and hematuria.  Musculoskeletal:       History bilateral knee pain. Difficulty ambulating pain with ambulation. She had discussed with Dr. Marily Memos total knee arthroplasty.  Skin: Negative for rash and wound.  Neurological: Negative for seizures and speech difficulty.  Hematological: Negative for adenopathy. Does not bruise/bleed easily.  Psychiatric/Behavioral: Negative for agitation and suicidal ideas.     Objective: Vital Signs: BP 140/90   Pulse 75   Ht 5\' 2"  (1.575 m)   Wt 176 lb (79.8 kg)   BMI 32.19 kg/m   Physical Exam  Constitutional: She is oriented to person, place, and time. She appears well-developed.  HENT:  Head: Normocephalic.  Right Ear: External ear normal.  Left Ear: External ear normal.  Eyes: Pupils are equal, round, and reactive to light.  Neck: No tracheal deviation present. No thyromegaly present.  Cardiovascular: Normal rate.   Pulmonary/Chest: Effort normal.  Abdominal: Soft.  Neurological: She is alert  and oriented to person, place, and time.  Skin: Skin is warm and dry.  Psychiatric: She has a normal mood and affect. Her behavior is normal.    Ortho Exam  Specialty Comments:  No specialty comments available.  Imaging: Xr Knee 1-2 Views Left  Result Date: 03/26/2016 Standing x-rays demonstrate some medial compartment narrowing with medial osteophytes both knees. Lateral joint space is maintained. Lateral x-ray shows mild irregularity  beyond the patella.    PMFS History: Patient Active Problem List   Diagnosis Date Noted  . Arthritis of left knee 02/25/2016  . Vaginitis and vulvovaginitis, unspecified 08/08/2013  . Leiomyoma of uterus, unspecified 08/08/2013  . Atypical chest pain 03/13/2013  . Abnormal ECG 03/13/2013  . Obesity, unspecified 03/13/2013  . Hypertension    Past Medical History:  Diagnosis Date  . Abnormal ECG 03/13/2013   TWI diffuse. Was seen prior to cath in 2007. No CAD.  Marland Kitchen Atypical chest pain 03/13/2013  . Hypertension     Family History  Problem Relation Age of Onset  . Hypertension Mother     Past Surgical History:  Procedure Laterality Date  . FOOT SURGERY     Social History   Occupational History  . Not on file.   Social History Main Topics  . Smoking status: Never Smoker  . Smokeless tobacco: Not on file  . Alcohol use No  . Drug use: Unknown  . Sexual activity: Yes    Birth control/ protection: Post-menopausal

## 2016-03-29 MED ORDER — METHYLPREDNISOLONE ACETATE 40 MG/ML IJ SUSP
40.0000 mg | INTRAMUSCULAR | Status: AC | PRN
  Administered 2016-03-26: 40 mg via INTRA_ARTICULAR

## 2016-03-29 MED ORDER — LIDOCAINE HCL 1 % IJ SOLN
1.0000 mL | INTRAMUSCULAR | Status: AC | PRN
  Administered 2016-03-26: 1 mL

## 2016-03-29 MED ORDER — BUPIVACAINE HCL 0.25 % IJ SOLN
0.6600 mL | INTRAMUSCULAR | Status: AC | PRN
  Administered 2016-03-26: .66 mL via INTRA_ARTICULAR

## 2016-04-12 HISTORY — PX: KNEE ARTHROSCOPY: SUR90

## 2016-04-20 ENCOUNTER — Encounter (INDEPENDENT_AMBULATORY_CARE_PROVIDER_SITE_OTHER): Payer: Self-pay | Admitting: Orthopaedic Surgery

## 2016-04-20 ENCOUNTER — Ambulatory Visit (INDEPENDENT_AMBULATORY_CARE_PROVIDER_SITE_OTHER): Payer: Medicare Other | Admitting: Orthopaedic Surgery

## 2016-04-20 VITALS — BP 118/80 | HR 67

## 2016-04-20 DIAGNOSIS — M1712 Unilateral primary osteoarthritis, left knee: Secondary | ICD-10-CM | POA: Diagnosis not present

## 2016-04-20 NOTE — Progress Notes (Signed)
Office Visit Note   Patient: Alexis Ball           Date of Birth: June 08, 1955           MRN: [id number] Visit Date: 04/20/2016              Requested by: Lucianne Lei, MD [address] [place], [place] [address] PCP: Elyn Peers, MD   Assessment & Plan: Visit Diagnoses:  1. Arthritis of left knee     Plan: Plain radiographs standing shows bilateral medial compartment knee osteoarthritis she is more symptomatic on the left and right knee. She has maintained her lateral space and has patellofemoral degenerative changes. Principal problem is bloating the patellofemoral joint with stairs or squatting. We will set her up for some physical therapy for quad strengthening exercises that do not the load the patellofemoral joint. Terminal extension exercises, straight leg raising, isometric quads etc. We will recheck her in 2 months.  Follow-Up Instructions: Return in about 2 months (around 06/18/2016).   Orders:  No orders of the defined types were placed in this encounter.  No orders of the defined types were placed in this encounter.     Procedures: No procedures performed   Clinical Data: No additional findings.   Subjective: Chief Complaint  Patient presents with  . Left Knee - Follow-up    Patient returns to have left knee checked. She had left knee injection in December and was supposed to follow up in three months. She says that the injection did help her left knee. It still hurts to get up from sitting to standing.     Review of Systems  Constitutional: Negative for chills and diaphoresis.  HENT: Negative for ear discharge, ear pain and nosebleeds.   Eyes: Negative for discharge and visual disturbance.  Respiratory: Negative for cough, choking and shortness of breath.   Cardiovascular: Negative for chest pain and palpitations.  Gastrointestinal: Negative for abdominal distention and abdominal pain.  Endocrine: Negative for cold intolerance and heat  intolerance.  Genitourinary: Negative for flank pain and hematuria.  Musculoskeletal:       Left knee pain particularly with stairs and squatting. She got some temporary relief for several weeks with intra-articular injection December 2017 and has had gradual return of her symptoms.  Skin: Negative for rash and wound.  Neurological: Negative for seizures and speech difficulty.  Hematological: Negative for adenopathy. Does not bruise/bleed easily.  Psychiatric/Behavioral: Negative for agitation and suicidal ideas.     Objective: Vital Signs: BP 118/80   Pulse 67   Physical Exam  Constitutional: She is oriented to person, place, and time. She appears well-developed.  HENT:  Head: Normocephalic.  Right Ear: External ear normal.  Left Ear: External ear normal.  Eyes: Pupils are equal, round, and reactive to light.  Neck: No tracheal deviation present. No thyromegaly present.  Cardiovascular: Normal rate.   Pulmonary/Chest: Effort normal.  Abdominal: Soft.  Musculoskeletal:  Patient has normal hip range of motion negative straight leg raising. Both knees reach full extension and flex past 110. Both knees show medial joint line tenderness more on the left than right. Patellofemoral crepitus positive patellar grind test. Ligaments are stable patellar tracking is normal. Distal pulses are 2+ EHL anterior tube ankle dorsiflexion plantar flexion peroneal's and posterior tib are normal. No pitting edema. A partially range of motion of wrists fingers digits elbow show no deformity no swelling good stability.  Neurological: She is alert and oriented to person, place,  and time.  Skin: Skin is warm and dry.  Psychiatric: She has a normal mood and affect. Her behavior is normal.    Ortho Exam  Specialty Comments:  No specialty comments available.  Imaging: No results found.   PMFS History: Patient Active Problem List   Diagnosis Date Noted  . Arthritis of left knee 02/25/2016  .  Vaginitis and vulvovaginitis, unspecified 08/08/2013  . Leiomyoma of uterus, unspecified 08/08/2013  . Atypical chest pain 03/13/2013  . Abnormal ECG 03/13/2013  . Obesity, unspecified 03/13/2013  . Hypertension    Past Medical History:  Diagnosis Date  . Abnormal ECG 03/13/2013   TWI diffuse. Was seen prior to cath in 2007. No CAD.  Marland Kitchen Atypical chest pain 03/13/2013  . Hypertension     Family History  Problem Relation Age of Onset  . Hypertension Mother     Past Surgical History:  Procedure Laterality Date  . FOOT SURGERY     Social History   Occupational History  . Not on file.   Social History Main Topics  . Smoking status: Never Smoker  . Smokeless tobacco: Not on file  . Alcohol use No  . Drug use: Unknown  . Sexual activity: Yes    Birth control/ protection: Post-menopausal

## 2016-05-31 ENCOUNTER — Other Ambulatory Visit (HOSPITAL_COMMUNITY)
Admission: RE | Admit: 2016-05-31 | Discharge: 2016-05-31 | Disposition: A | Discharge status: Home / Self Care | Payer: Medicare Other | Source: Ambulatory Visit | Attending: Family Medicine | Admitting: Family Medicine

## 2016-05-31 ENCOUNTER — Other Ambulatory Visit: Payer: Self-pay | Admitting: Family Medicine

## 2016-05-31 DIAGNOSIS — I10 Essential (primary) hypertension: Secondary | ICD-10-CM | POA: Diagnosis not present

## 2016-05-31 DIAGNOSIS — N760 Acute vaginitis: Secondary | ICD-10-CM | POA: Diagnosis not present

## 2016-05-31 DIAGNOSIS — M23201 Derangement of unspecified lateral meniscus due to old tear or injury, left knee: Secondary | ICD-10-CM | POA: Diagnosis not present

## 2016-05-31 DIAGNOSIS — Z113 Encounter for screening for infections with a predominantly sexual mode of transmission: Secondary | ICD-10-CM | POA: Insufficient documentation

## 2016-05-31 DIAGNOSIS — E1149 Type 2 diabetes mellitus with other diabetic neurological complication: Secondary | ICD-10-CM | POA: Diagnosis not present

## 2016-06-03 LAB — URINE CYTOLOGY ANCILLARY ONLY
CANDIDA VAGINITIS: NEGATIVE
CHLAMYDIA, DNA PROBE: POSITIVE — AB
NEISSERIA GONORRHEA: NEGATIVE
Trichomonas: NEGATIVE

## 2016-06-15 DIAGNOSIS — R197 Diarrhea, unspecified: Secondary | ICD-10-CM | POA: Diagnosis not present

## 2016-06-22 ENCOUNTER — Ambulatory Visit (INDEPENDENT_AMBULATORY_CARE_PROVIDER_SITE_OTHER): Payer: Medicare Other | Admitting: Orthopaedic Surgery

## 2016-06-25 ENCOUNTER — Ambulatory Visit (INDEPENDENT_AMBULATORY_CARE_PROVIDER_SITE_OTHER): Payer: Medicare Other | Admitting: Orthopaedic Surgery

## 2016-06-29 DIAGNOSIS — R197 Diarrhea, unspecified: Secondary | ICD-10-CM | POA: Diagnosis not present

## 2016-07-02 ENCOUNTER — Ambulatory Visit (INDEPENDENT_AMBULATORY_CARE_PROVIDER_SITE_OTHER): Payer: Medicare Other | Admitting: Orthopaedic Surgery

## 2016-07-02 VITALS — BP 128/67 | HR 69 | Ht 63.0 in | Wt 178.0 lb

## 2016-07-02 DIAGNOSIS — M2242 Chondromalacia patellae, left knee: Secondary | ICD-10-CM | POA: Diagnosis not present

## 2016-07-02 MED ORDER — METHYLPREDNISOLONE ACETATE 40 MG/ML IJ SUSP
40.0000 mg | INTRAMUSCULAR | Status: AC | PRN
  Administered 2016-07-02: 40 mg via INTRA_ARTICULAR

## 2016-07-02 MED ORDER — LIDOCAINE HCL 1 % IJ SOLN
1.0000 mL | INTRAMUSCULAR | Status: AC | PRN
  Administered 2016-07-02: 1 mL

## 2016-07-02 MED ORDER — BUPIVACAINE HCL 0.25 % IJ SOLN
0.6600 mL | INTRAMUSCULAR | Status: AC | PRN
  Administered 2016-07-02: .66 mL via INTRA_ARTICULAR

## 2016-07-02 NOTE — Progress Notes (Signed)
Office Visit Note   Patient: Alexis Ball           Date of Birth: March 12, 1956           MRN: [id number] Visit Date: 07/02/2016              Requested by: Lucianne Lei, MD [address] [place],  [address] PCP: Elyn Peers, MD   Assessment & Plan: Visit Diagnoses:  1. Chondromalacia patellae, left knee          With synovitis  Plan: Patient has not been seen in 3 months she never got a physical therapy prescription and cannot find it.  Prescription was given for therapy for treatment of chondromalacia. She had previous injection the fall got good relief for many months. Unfortunately she's been less mobile less active and actually gained weight. She requested repeat injection which we performed an new physical therapy prescription which was provided.  Follow-Up Instructions: Return if symptoms worsen or fail to improve.   Orders:  Orders Placed This Encounter  Procedures  . Large Joint Injection/Arthrocentesis   No orders of the defined types were placed in this encounter.     Procedures: Large Joint Inj Date/Time: 07/02/2016 2:48 PM Performed by: Marybelle Killings Authorized by: Marybelle Killings   Consent Given by:  Patient Site marked: the procedure site was marked   Indications:  Pain and joint swelling Location:  Knee Site:  L knee Needle Size:  22 G Needle Length:  1.5 inches Approach:  Anterolateral Ultrasound Guidance: No   Fluoroscopic Guidance: No   Arthrogram: No   Medications:  1 mL lidocaine 1 %; 40 mg methylPREDNISolone acetate 40 MG/ML; 0.66 mL bupivacaine 0.25 % Patient tolerance:  Patient tolerated the procedure well with no immediate complications     Clinical Data: No additional findings.   Subjective: Chief Complaint  Patient presents with  . Left Knee - Follow-up    Patient returns today for left knee recheck.  Patient had an injection on 03/26/16 with relief for one month. Gradually pain has returned. Since yesterday reports  having weakness and giving way in left knee after she was walking up some stairs and stepped wrong, felt pop in her leg. Has been bothersome since.   Patient states she is going to get back into water aerobics. Showed the episode where she slipped she's come off the bike and had increased knee pain. No locking or catching sensation she denies fever or chills.  Review of Systems unchanged from December 2017 office visit other than as mentioned above.   Objective: Vital Signs: BP 128/67   Pulse 69   Ht 5\' 3"  (1.6 m)   Wt 178 lb (80.7 kg)   BMI 31.53 kg/m   Physical Exam  Constitutional: She is oriented to person, place, and time. She appears well-developed.  HENT:  Head: Normocephalic.  Right Ear: External ear normal.  Left Ear: External ear normal.  Eyes: Pupils are equal, round, and reactive to light.  Neck: No tracheal deviation present. No thyromegaly present.  Cardiovascular: Normal rate.   Pulmonary/Chest: Effort normal.  Abdominal: Soft.  Musculoskeletal:  Well-healed arthroscopic portals left knee. No pain with hip range of motion negative straight leg raising. There is 2+ knee effusion she is tender primarily with patellofemoral loading. Difficulty getting from sitting standing she is using her arms to help. Pain with loading patellofemoral joint. Negative patellar subluxation. Distal pulses are intact negative Homans no rash or exposed  skin. Upper extremity show good range of motion no impingement of the shoulders good grip strength. Pedal pulses are normal. Left knee collateral cruciate ligament exam is normal.  Neurological: She is alert and oriented to person, place, and time.  Skin: Skin is warm and dry.  Psychiatric: She has a normal mood and affect. Her behavior is normal.    Ortho Exam  Specialty Comments:  No specialty comments available.  Imaging: No results found.   PMFS History: Patient Active Problem List   Diagnosis Date Noted  . Chondromalacia  patellae, left knee 07/02/2016  . Arthritis of left knee 02/25/2016  . Vaginitis and vulvovaginitis, unspecified 08/08/2013  . Leiomyoma of uterus, unspecified 08/08/2013  . Atypical chest pain 03/13/2013  . Abnormal ECG 03/13/2013  . Obesity, unspecified 03/13/2013  . Hypertension    Past Medical History:  Diagnosis Date  . Abnormal ECG 03/13/2013   TWI diffuse. Was seen prior to cath in 2007. No CAD.  Marland Kitchen Atypical chest pain 03/13/2013  . Hypertension     Family History  Problem Relation Age of Onset  . Hypertension Mother     Past Surgical History:  Procedure Laterality Date  . FOOT SURGERY     Social History   Occupational History  . Not on file.   Social History Main Topics  . Smoking status: Never Smoker  . Smokeless tobacco: Not on file  . Alcohol use No  . Drug use: Unknown  . Sexual activity: Yes    Birth control/ protection: Post-menopausal

## 2016-07-13 ENCOUNTER — Other Ambulatory Visit (HOSPITAL_COMMUNITY)
Admission: RE | Admit: 2016-07-13 | Discharge: 2016-07-13 | Disposition: A | Discharge status: Home / Self Care | Payer: Medicare Other | Source: Ambulatory Visit | Attending: Family Medicine | Admitting: Family Medicine

## 2016-07-13 ENCOUNTER — Other Ambulatory Visit: Payer: Self-pay | Admitting: Family Medicine

## 2016-07-13 DIAGNOSIS — N390 Urinary tract infection, site not specified: Secondary | ICD-10-CM | POA: Diagnosis not present

## 2016-07-13 DIAGNOSIS — Z113 Encounter for screening for infections with a predominantly sexual mode of transmission: Secondary | ICD-10-CM | POA: Insufficient documentation

## 2016-07-13 DIAGNOSIS — I10 Essential (primary) hypertension: Secondary | ICD-10-CM | POA: Diagnosis not present

## 2016-07-14 DIAGNOSIS — H04123 Dry eye syndrome of bilateral lacrimal glands: Secondary | ICD-10-CM | POA: Diagnosis not present

## 2016-07-14 DIAGNOSIS — H401133 Primary open-angle glaucoma, bilateral, severe stage: Secondary | ICD-10-CM | POA: Diagnosis not present

## 2016-07-14 DIAGNOSIS — Z961 Presence of intraocular lens: Secondary | ICD-10-CM | POA: Diagnosis not present

## 2016-07-15 LAB — URINE CYTOLOGY ANCILLARY ONLY
Bacterial vaginitis: POSITIVE — AB
CANDIDA VAGINITIS: NEGATIVE
Chlamydia: NEGATIVE
NEISSERIA GONORRHEA: NEGATIVE
TRICH (WINDOWPATH): NEGATIVE

## 2016-07-26 ENCOUNTER — Ambulatory Visit: Payer: Medicare Other | Attending: Orthopaedic Surgery | Admitting: Physical Therapy

## 2016-07-26 DIAGNOSIS — M222X2 Patellofemoral disorders, left knee: Secondary | ICD-10-CM | POA: Insufficient documentation

## 2016-07-26 DIAGNOSIS — M6281 Muscle weakness (generalized): Secondary | ICD-10-CM | POA: Insufficient documentation

## 2016-07-26 DIAGNOSIS — R262 Difficulty in walking, not elsewhere classified: Secondary | ICD-10-CM | POA: Insufficient documentation

## 2016-07-26 DIAGNOSIS — M1712 Unilateral primary osteoarthritis, left knee: Secondary | ICD-10-CM | POA: Insufficient documentation

## 2016-07-26 DIAGNOSIS — M25662 Stiffness of left knee, not elsewhere classified: Secondary | ICD-10-CM | POA: Insufficient documentation

## 2016-07-27 DIAGNOSIS — M23201 Derangement of unspecified lateral meniscus due to old tear or injury, left knee: Secondary | ICD-10-CM | POA: Diagnosis not present

## 2016-07-27 DIAGNOSIS — I10 Essential (primary) hypertension: Secondary | ICD-10-CM | POA: Diagnosis not present

## 2016-07-27 DIAGNOSIS — E1149 Type 2 diabetes mellitus with other diabetic neurological complication: Secondary | ICD-10-CM | POA: Diagnosis not present

## 2016-07-27 DIAGNOSIS — N760 Acute vaginitis: Secondary | ICD-10-CM | POA: Diagnosis not present

## 2016-08-05 ENCOUNTER — Ambulatory Visit: Payer: Medicare Other | Admitting: Physical Therapy

## 2016-08-09 ENCOUNTER — Ambulatory Visit: Payer: Medicare Other

## 2016-08-09 DIAGNOSIS — M6281 Muscle weakness (generalized): Secondary | ICD-10-CM

## 2016-08-09 DIAGNOSIS — R262 Difficulty in walking, not elsewhere classified: Secondary | ICD-10-CM | POA: Diagnosis not present

## 2016-08-09 DIAGNOSIS — M25662 Stiffness of left knee, not elsewhere classified: Secondary | ICD-10-CM | POA: Diagnosis not present

## 2016-08-09 DIAGNOSIS — M222X2 Patellofemoral disorders, left knee: Secondary | ICD-10-CM | POA: Diagnosis not present

## 2016-08-09 DIAGNOSIS — M1712 Unilateral primary osteoarthritis, left knee: Secondary | ICD-10-CM

## 2016-08-09 NOTE — Patient Instructions (Signed)
Issued from cabinet SLR  Lying   Flex/abduct extension   Daily 10-20 reps  Each leg  Hold 1-3 sec

## 2016-08-09 NOTE — Therapy (Signed)
[place], [name], [address] Phone: [phone]   Fax:  [phone]  Physical Therapy Evaluation  Patient Details  Name: Alexis Ball MRN: [id number] Date of Birth: 08/10/1955 Referring Provider: Rodell Perna, MD  Encounter Date: 08/09/2016      PT End of Session - 08/09/16 1221    Visit Number 1   Number of Visits 10   Date for PT Re-Evaluation 09/10/16   Authorization Type [employer] MCR   PT Start Time 1100   PT Stop Time 7793   PT Time Calculation (min) 45 min   Activity Tolerance Patient tolerated treatment well;No increased pain   Behavior During Therapy WFL for tasks assessed/performed      Past Medical History:  Diagnosis Date  . Abnormal ECG 03/13/2013   TWI diffuse. Was seen prior to cath in 2007. No CAD.  Marland Kitchen Atypical chest pain 03/13/2013  . Hypertension     Past Surgical History:  Procedure Laterality Date  . FOOT SURGERY      There were no vitals filed for this visit.       Subjective Assessment - 08/09/16 1153    Subjective She reports LT knee pain.  She reports onset of pain 15 years ago with injury pushing wheelchair.  Pain  was on and off until she had meniscal surgery in 2015 when knee got some better and last year 01/2016 she was pushing wheel chair and fell on LT knee going down hill.   She has had injections x 2 and swelling and pain continues.   She reports TKA next step.    Limitations Walking;Standing;House hold activities   How long can you sit comfortably? As needed as long as can elevate leg   How long can you stand comfortably? 30 min    How long can you walk comfortably? Not sure   Patient Stated Goals She wants to be able to stand and walk for an hour/ a mile.   Last did 5 laps in gym was 05/1016   Currently in Pain? Yes   Pain Score 5    Pain Location Knee   Pain Orientation Left   Pain Descriptors / Indicators Throbbing   Pain Type Chronic pain   Pain Onset More  than a month ago   Pain Frequency Constant   Aggravating Factors  stand / walking / sit in car/     Pain Relieving Factors elevate /rub/ heat/ sleeve   Multiple Pain Sites No            [facility] PT Assessment - 08/09/16 0001      Assessment   Medical Diagnosis patellofemoral pain   Referring Provider Rodell Perna, MD   Onset Date/Surgical Date --  01/2016   Hand Dominance Right   Next MD Visit June 2018   Prior Therapy No     Precautions   Precautions None     Restrictions   Weight Bearing Restrictions No     Balance Screen   Has the patient fallen in the past 6 months Yes   How many times? 1  pushing wheel chair   Has the patient had a decrease in activity level because of a fear of falling?  Yes  due to pain   Is the patient reluctant to leave their home because of a fear of falling?  No     Home Environment   Living Environment Private residence   Type of [place]  Access Stairs to enter   Entrance Stairs-Number of Steps 2   Entrance Stairs-Rails None   [place] - single point     Prior Function   Level of Independence Requires assistive device for independence;Needs assistance with ADLs;Needs assistance with homemaking     Cognition   Overall Cognitive Status Within Functional Limits for tasks assessed     ROM / Strength   AROM / PROM / Strength AROM;Strength     AROM   AROM Assessment Site Knee   Right/Left Knee Right;Left   Right Knee Extension 0   Right Knee Flexion 125   Left Knee Extension 0   Left Knee Flexion 120     Strength   Overall Strength Comments Hip strength RT 4/5  LT ext 3/5  abduct 3+, flexion 4-/5   Strength Assessment Site Knee   Right/Left Knee Right;Left   Right Knee Flexion 5/5   Right Knee Extension 5/5   Left Knee Flexion 4+/5   Left Knee Extension 4+/5     Palpation   Palpation comment [place] around LT patella, poor quad set.      Ambulation/Gait   Gait Comments no device though she owns one ,  antalgic on LT                            PT Education - 08/09/16 1204    Education provided Yes   Education Details POC, SLR quad set   Person(s) Educated Patient   Methods Explanation   Comprehension Verbalized understanding          PT Short Term Goals - 08/09/16 1228      PT SHORT TERM GOAL #1   Title She will be independent with initial HEP   Time 2   Period Weeks   Status New           PT Long Term Goals - 08/09/16 1228      PT LONG TERM GOAL #1   Title She will be independent with all HEP issued   Time 5   Period Weeks   Status New     PT LONG TERM GOAL #2   Title she will report pain decr 50% or more with walking for 29min in store   Time 5   Period Weeks   Status New     PT LONG TERM GOAL #3   Title she will improve hip strength to 4/5 to improve stability on feet.    Time 5   Period Weeks   Status New               Plan - 08/09/16 1224    Clinical Impression Statement Alexis Ball presents for low complexity eval with LT knee pain due to degenerative changes in knee/patella.   She is weak and has slight decreased flexion. . Her gait is antalgic and htough owns a SPc did not bring today.     Rehab Potential Fair   PT Frequency 2x / week   PT Duration --  5 weeks   PT Treatment/Interventions Cryotherapy;Electrical Stimulation;Iontophoresis 4mg /ml Dexamethasone;Traction;Passive range of motion;Patient/family education;Taping;Manual techniques;Therapeutic activities   PT Next Visit Plan Modalities , strength , taping,    PT Home Exercise Plan 3 way SLR , ice /heat/stim/, tape   Consulted and Agree with Plan of Care Patient      Patient will benefit from skilled therapeutic intervention in order to improve the following deficits  and impairments:  Pain, Decreased activity tolerance, Decreased range of motion, Difficulty walking, Decreased strength, Increased muscle spasms  Visit Diagnosis: Patellofemoral pain syndrome of  left knee  Osteoarthritis of left knee, unspecified osteoarthritis type  Stiffness of left knee, not elsewhere classified  Muscle weakness (generalized)  Difficulty in walking, not elsewhere classified      G-Codes - 2016-08-18 1233    Functional Assessment Tool Used (Outpatient Only) clinical judgement   Functional Limitation Mobility: Walking and moving around   Mobility: Walking and Moving Around Current Status [phone]) At least 40 percent but less than 60 percent impaired, limited or restricted   Mobility: Walking and Moving Around Goal Status [phone]) At least 20 percent but less than 40 percent impaired, limited or restricted       Problem List Patient Active Problem List   Diagnosis Date Noted  . Chondromalacia patellae, left knee 07/02/2016  . Arthritis of left knee 02/25/2016  . Vaginitis and vulvovaginitis, unspecified 08/08/2013  . Leiomyoma of uterus, unspecified 08/08/2013  . Atypical chest pain 03/13/2013  . Abnormal ECG 03/13/2013  . Obesity, unspecified 03/13/2013  . Hypertension     Darrel Hoover  PT Aug 18, 2016, 12:50 PM  [facility] [address] [place], [name], [address] Phone: [phone]   Fax:  [phone]  Name: Alexis Ball MRN: [id number] Date of Birth: Jun 01, 1955

## 2016-08-12 ENCOUNTER — Telehealth: Payer: Self-pay

## 2016-08-12 ENCOUNTER — Ambulatory Visit: Payer: Medicare Other | Attending: Family Medicine

## 2016-08-12 DIAGNOSIS — M222X2 Patellofemoral disorders, left knee: Secondary | ICD-10-CM | POA: Insufficient documentation

## 2016-08-12 DIAGNOSIS — M1712 Unilateral primary osteoarthritis, left knee: Secondary | ICD-10-CM | POA: Insufficient documentation

## 2016-08-12 DIAGNOSIS — M6281 Muscle weakness (generalized): Secondary | ICD-10-CM | POA: Insufficient documentation

## 2016-08-12 DIAGNOSIS — R262 Difficulty in walking, not elsewhere classified: Secondary | ICD-10-CM | POA: Insufficient documentation

## 2016-08-12 DIAGNOSIS — M25662 Stiffness of left knee, not elsewhere classified: Secondary | ICD-10-CM | POA: Insufficient documentation

## 2016-08-12 NOTE — Telephone Encounter (Signed)
Message left reminding of missed appointment and next appointment was  left on message.

## 2016-08-17 ENCOUNTER — Ambulatory Visit: Payer: Medicare Other

## 2016-08-18 ENCOUNTER — Ambulatory Visit (INDEPENDENT_AMBULATORY_CARE_PROVIDER_SITE_OTHER): Payer: Medicare Other

## 2016-08-18 ENCOUNTER — Encounter: Payer: Self-pay | Admitting: Podiatry

## 2016-08-18 ENCOUNTER — Ambulatory Visit (INDEPENDENT_AMBULATORY_CARE_PROVIDER_SITE_OTHER): Payer: Medicare Other | Admitting: Podiatry

## 2016-08-18 DIAGNOSIS — R52 Pain, unspecified: Secondary | ICD-10-CM

## 2016-08-18 DIAGNOSIS — M722 Plantar fascial fibromatosis: Secondary | ICD-10-CM | POA: Diagnosis not present

## 2016-08-18 DIAGNOSIS — M79673 Pain in unspecified foot: Secondary | ICD-10-CM

## 2016-08-18 DIAGNOSIS — M205X2 Other deformities of toe(s) (acquired), left foot: Secondary | ICD-10-CM

## 2016-08-18 NOTE — Progress Notes (Signed)
This patient presents the office with chief complaint of pain through the bottom of both feet. She says that she stands at work and it requires significant amount of walking on concrete floors. She says she walks with good shoes which helped. She still experiences pain after a days activity when she takes off her shoes. She has past history of having surgery on the big toe joint of the left foot. She also says that she had knee surgery on the knee on the left foot. She presents the office today for an evaluation and treatment of her painful feet   GENERAL APPEARANCE: Alert, conversant. Appropriately groomed. No acute distress.  VASCULAR: Pedal pulses are  palpable at  [facility] and PT bilateral.  Capillary refill time is immediate to all digits,  Normal temperature gradient.  Digital hair growth is present bilateral  NEUROLOGIC: sensation is normal to 5.07 monofilament at 5/5 sites bilateral.  Light touch is intact bilateral, Muscle strength normal.  MUSCULOSKELETAL: acceptable muscle strength, tone and stability bilateral.  Pain big toe joint left greater than right foot.  Pain through forefoot  B/L  Pain along the course of the plantar fascia  B/L  Hallux limitus 1st MPJ  B/L. Increased swelling left foot than right foot. Pes planus foot type  B/L  DERMATOLOGIC: skin color, texture, and turgor are within normal limits.  No preulcerative lesions or ulcers  are seen, no interdigital maceration noted.  No open lesions present.  Digital nails are asymptomatic. No drainage noted.  Plantar fascitis  Hallux limitus 1st MPJ left foot. \ ROV  x-rays reveal arthritic changes at the head of the first metatarsal of the left foot. Patient does have a flattened calcaneal inclination angle bilateral. No evidence of any calcification at the insertion of the plantar fascia of the Achilles tendon bilateral. Discuss this condition with the patient and we decided to treat her with power step insoles in an effort to and  ultimately help to resolve her pain. She is to return the office in 4 weeks for further evaluation and treatment   Gardiner Barefoot DPM

## 2016-08-19 ENCOUNTER — Ambulatory Visit: Payer: Medicare Other

## 2016-08-19 ENCOUNTER — Telehealth: Payer: Self-pay

## 2016-08-19 NOTE — Telephone Encounter (Signed)
Voice mail not set up for this number. Unable to leave message

## 2016-08-24 ENCOUNTER — Ambulatory Visit: Payer: Medicare Other | Admitting: Physical Therapy

## 2016-08-24 ENCOUNTER — Encounter: Payer: Self-pay | Admitting: Physical Therapy

## 2016-08-24 DIAGNOSIS — M222X2 Patellofemoral disorders, left knee: Secondary | ICD-10-CM | POA: Diagnosis not present

## 2016-08-24 DIAGNOSIS — M1712 Unilateral primary osteoarthritis, left knee: Secondary | ICD-10-CM | POA: Diagnosis not present

## 2016-08-24 DIAGNOSIS — M6281 Muscle weakness (generalized): Secondary | ICD-10-CM | POA: Diagnosis not present

## 2016-08-24 DIAGNOSIS — R262 Difficulty in walking, not elsewhere classified: Secondary | ICD-10-CM

## 2016-08-24 DIAGNOSIS — M25662 Stiffness of left knee, not elsewhere classified: Secondary | ICD-10-CM

## 2016-08-24 NOTE — Therapy (Signed)
[place], [name], [address] Phone: [phone]   Fax:  [phone]  Physical Therapy Treatment  Patient Details  Name: Alexis Ball MRN: [id number] Date of Birth: Aug 30, 1955 Referring Provider: Rodell Perna, MD  Encounter Date: 08/24/2016      PT End of Session - 08/24/16 1251    Visit Number 2   Number of Visits 10   Date for PT Re-Evaluation 09/10/16   PT Start Time 1937   PT Stop Time 1229   PT Time Calculation (min) 41 min   Activity Tolerance Patient tolerated treatment well   Behavior During Therapy [facility] for tasks assessed/performed      Past Medical History:  Diagnosis Date  . Abnormal ECG 03/13/2013   TWI diffuse. Was seen prior to cath in 2007. No CAD.  Marland Kitchen Atypical chest pain 03/13/2013  . Hypertension     Past Surgical History:  Procedure Laterality Date  . FOOT SURGERY      There were no vitals filed for this visit.      Subjective Assessment - 08/24/16 1151    Subjective No pain at rest. She is doing the exercises.   Currently in Pain? No/denies   Pain Score --  up to moderate pain   Pain Location Knee   Pain Orientation Left   Pain Descriptors / Indicators Throbbing  pops   Pain Type Chronic pain   Pain Frequency Intermittent   Aggravating Factors  stand sit ,  riding on cart at store,  walking   Pain Relieving Factors elevate, heat, ointment   Multiple Pain Sites No                         [facility] Adult PT Treatment/Exercise - 08/24/16 0001      Knee/Hip Exercises: Stretches   Passive Hamstring Stretch 2 reps;30 seconds   Gastroc Stretch 3 reps;30 seconds   Gastroc Stretch Limitations HEP,  cues for foot position.     Knee/Hip Exercises: Aerobic   Recumbent Bike 5 minutes  L1     Knee/Hip Exercises: Seated   Long Arc Quad Weight 0 lbs.   Long [employer] Limitations 10  with ball squeeze   Hamstring Curl 10 reps;2 sets   Hamstring Limitations  yellow band     Knee/Hip Exercises: Supine   Straight Leg Raises 1 set;10 reps   Straight Leg Raises Limitations cues     Knee/Hip Exercises: Sidelying   Hip ADduction 1 set;10 reps   Hip ADduction Limitations cues     Knee/Hip Exercises: Prone   Hip Extension 10 reps   Hip Extension Limitations cues ,  fatigues quickly                PT Education - 08/24/16 1251    Education provided Yes   Education Details HEP   Person(s) Educated Patient   Methods Explanation   Comprehension Verbalized understanding          PT Short Term Goals - 08/24/16 1253      PT SHORT TERM GOAL #1   Title She will be independent with initial HEP   Baseline min cues   Time 2   Period Weeks   Status On-going           PT Long Term Goals - 08/24/16 1153      PT LONG TERM GOAL #1   Title She will be independent with all HEP issued  Baseline cues   Time 5   Period Weeks   Status On-going     PT LONG TERM GOAL #2   Title she will report pain decr 50% or more with walking for 66min in store   Baseline able to walk 2 blocks with pops and cramps ,  pain 2/10   Time 5   Period Weeks   Status On-going     PT LONG TERM GOAL #3   Title she will improve hip strength to 4/5 to improve stability on feet.    Baseline 3/5 left   Time 5   Period Weeks   Status On-going               Plan - 08/24/16 1252    Clinical Impression Statement Pain 1/10 post session.  She declined the need for Modalities.  She requires min cues for HEP.  Hip strength unchanged.   PT Next Visit Plan Modalities , strength , taping,    PT Home Exercise Plan 3 way SLR , ice /heat/stim/, tape,  Gastroc stretch   Consulted and Agree with Plan of Care Patient      Patient will benefit from skilled therapeutic intervention in order to improve the following deficits and impairments:  Pain, Decreased activity tolerance, Decreased range of motion, Difficulty walking, Decreased strength, Increased muscle  spasms  Visit Diagnosis: Patellofemoral pain syndrome of left knee  Osteoarthritis of left knee, unspecified osteoarthritis type  Stiffness of left knee, not elsewhere classified  Muscle weakness (generalized)  Difficulty in walking, not elsewhere classified     Problem List Patient Active Problem List   Diagnosis Date Noted  . Chondromalacia patellae, left knee 07/02/2016  . Arthritis of left knee 02/25/2016  . Vaginitis and vulvovaginitis, unspecified 08/08/2013  . Leiomyoma of uterus, unspecified 08/08/2013  . Atypical chest pain 03/13/2013  . Abnormal ECG 03/13/2013  . Obesity, unspecified 03/13/2013  . Hypertension     Michon Kaczmarek PTA 08/24/2016, 12:54 PM  [facility] [address] [place], [name], [address] Phone: [phone]   Fax:  [phone]  Name: Alexis Ball MRN: [id number] Date of Birth: 07-23-1955

## 2016-08-24 NOTE — Patient Instructions (Signed)
Achilles / Gastroc, Standing    Stand, right foot behind, heel on floor and turned slightly out, leg straight, forward leg bent. Move hips forward. Hold _30__ seconds. Repeat _3_ times per session. Do __1 _ sessions per day.  Copyright  VHI. All rights reserved.   

## 2016-08-26 ENCOUNTER — Ambulatory Visit: Payer: Medicare Other

## 2016-08-26 DIAGNOSIS — M25662 Stiffness of left knee, not elsewhere classified: Secondary | ICD-10-CM

## 2016-08-26 DIAGNOSIS — M1712 Unilateral primary osteoarthritis, left knee: Secondary | ICD-10-CM

## 2016-08-26 DIAGNOSIS — M222X2 Patellofemoral disorders, left knee: Secondary | ICD-10-CM | POA: Diagnosis not present

## 2016-08-26 DIAGNOSIS — R262 Difficulty in walking, not elsewhere classified: Secondary | ICD-10-CM | POA: Diagnosis not present

## 2016-08-26 DIAGNOSIS — M6281 Muscle weakness (generalized): Secondary | ICD-10-CM | POA: Diagnosis not present

## 2016-08-26 NOTE — Therapy (Signed)
[place], [name], [address] Phone: [phone]   Fax:  [phone]  Physical Therapy Treatment  Patient Details  Name: Alexis Ball MRN: [id number] Date of Birth: 02/26/1956 Referring Provider: Rodell Perna, MD  Encounter Date: 08/26/2016      PT End of Session - 08/26/16 0943    Visit Number 3   Number of Visits 10   Date for PT Re-Evaluation 09/10/16   Authorization Type UHC MCR   PT Start Time 0945   PT Stop Time 1040   PT Time Calculation (min) 55 min   Activity Tolerance Patient tolerated treatment well   Behavior During Therapy [facility] for tasks assessed/performed      Past Medical History:  Diagnosis Date  . Abnormal ECG 03/13/2013   TWI diffuse. Was seen prior to cath in 2007. No CAD.  Marland Kitchen Atypical chest pain 03/13/2013  . Hypertension     Past Surgical History:  Procedure Laterality Date  . FOOT SURGERY      There were no vitals filed for this visit.      Subjective Assessment - 08/26/16 0956    Subjective PAin 5/10 today walking in . Left cane in car   Currently in Pain? Yes   Pain Score 5    Pain Location Knee   Pain Orientation Left   Pain Descriptors / Indicators Throbbing  pop   Pain Type Chronic pain   Pain Onset More than a month ago   Pain Frequency Intermittent   Aggravating Factors  sit/stand /walk   Pain Relieving Factors heat , ointmentt   Multiple Pain Sites No                         [facility] Adult PT Treatment/Exercise - 08/26/16 0001      Knee/Hip Exercises: Aerobic   Recumbent Bike 6 minutes  L2     Knee/Hip Exercises: Seated   Long Arc Quad Limitations 2x 10 with ball squeeze   Hamstring Curl 10 reps;2 sets   Hamstring Limitations redband     Knee/Hip Exercises: Supine   [employer] Limitations 15 reps    Straight Leg Raises Right;Left;10 reps;2 sets   Straight Leg Raises Limitations ed to keep leg straight and ankle DF     Knee/Hip  Exercises: Sidelying   Hip ABduction Right;Left;15 reps     Knee/Hip Exercises: Prone   Hip Extension Right;Left;15 reps     Modalities   Modalities Moist Heat     Moist Heat Therapy   Number Minutes Moist Heat 12 Minutes   Moist Heat Location Knee  LT                PT Education - 08/26/16 1011    Education provided --   Education Details --   Forensic psychologist) Educated --   Methods --   Comprehension --          PT Short Term Goals - 08/24/16 1253      PT SHORT TERM GOAL #1   Title She will be independent with initial HEP   Baseline min cues   Time 2   Period Weeks   Status On-going           PT Long Term Goals - 08/24/16 1153      PT LONG TERM GOAL #1   Title She will be independent with all HEP issued   Baseline cues   Time 5  Period Weeks   Status On-going     PT LONG TERM GOAL #2   Title she will report pain decr 50% or more with walking for 86min in store   Baseline able to walk 2 blocks with pops and cramps ,  pain 2/10   Time 5   Period Weeks   Status On-going     PT LONG TERM GOAL #3   Title she will improve hip strength to 4/5 to improve stability on feet.    Baseline 3/5 left   Time 5   Period Weeks   Status On-going               Plan - 08/26/16 2458    Clinical Impression Statement minimal pain after last session , Today pain more at 5/10 to start and 3/10 post exercises Heat per request . felt better post heat.   we will progress strength as tolerated with weight or band. or hold times/reps incr   PT Treatment/Interventions Cryotherapy;Electrical Stimulation;Iontophoresis 4mg /ml Dexamethasone;Traction;Passive range of motion;Patient/family education;Taping;Manual techniques;Therapeutic activities   PT Next Visit Plan Modalities , strength , taping,    PT Home Exercise Plan 3 way SLR , ice /heat/stim/, tape,  Gastroc stretch   Consulted and Agree with Plan of Care Patient      Patient will benefit from skilled  therapeutic intervention in order to improve the following deficits and impairments:  Pain, Decreased activity tolerance, Decreased range of motion, Difficulty walking, Decreased strength, Increased muscle spasms  Visit Diagnosis: Patellofemoral pain syndrome of left knee  Osteoarthritis of left knee, unspecified osteoarthritis type  Stiffness of left knee, not elsewhere classified  Muscle weakness (generalized)  Difficulty in walking, not elsewhere classified     Problem List Patient Active Problem List   Diagnosis Date Noted  . Chondromalacia patellae, left knee 07/02/2016  . Arthritis of left knee 02/25/2016  . Vaginitis and vulvovaginitis, unspecified 08/08/2013  . Leiomyoma of uterus, unspecified 08/08/2013  . Atypical chest pain 03/13/2013  . Abnormal ECG 03/13/2013  . Obesity, unspecified 03/13/2013  . Hypertension     Darrel Hoover  PT 08/26/2016, 10:34 AM  [facility] [address] [place], [name], [address] Phone: [phone]   Fax:  [phone]  Name: Alexis Ball MRN: [id number] Date of Birth: [birth year]

## 2016-08-31 ENCOUNTER — Ambulatory Visit: Payer: Medicare Other

## 2016-09-02 ENCOUNTER — Ambulatory Visit: Payer: Medicare Other | Admitting: Physical Therapy

## 2016-09-07 ENCOUNTER — Ambulatory Visit: Payer: Medicare Other

## 2016-09-07 DIAGNOSIS — R262 Difficulty in walking, not elsewhere classified: Secondary | ICD-10-CM

## 2016-09-07 DIAGNOSIS — M1712 Unilateral primary osteoarthritis, left knee: Secondary | ICD-10-CM

## 2016-09-07 DIAGNOSIS — M6281 Muscle weakness (generalized): Secondary | ICD-10-CM | POA: Diagnosis not present

## 2016-09-07 DIAGNOSIS — M25662 Stiffness of left knee, not elsewhere classified: Secondary | ICD-10-CM | POA: Diagnosis not present

## 2016-09-07 DIAGNOSIS — M222X2 Patellofemoral disorders, left knee: Secondary | ICD-10-CM

## 2016-09-07 NOTE — Therapy (Signed)
[place], [name], [address] Phone: [phone]   Fax:  [phone]  Physical Therapy Treatment  Patient Details  Name: Alexis Ball MRN: [id number] Date of Birth: 09-Mar-1956 Referring Provider: Rodell Perna, MD  Encounter Date: 09/07/2016      PT End of Session - 09/07/16 1151    Visit Number 4   Number of Visits 10   Date for PT Re-Evaluation 09/10/16   Authorization Type UHC MCR   PT Start Time 1150   PT Stop Time 8938   PT Time Calculation (min) 45 min   Equipment Utilized During Treatment Gait belt   Activity Tolerance Patient tolerated treatment well   Behavior During Therapy [facility] for tasks assessed/performed      Past Medical History:  Diagnosis Date  . Abnormal ECG 03/13/2013   TWI diffuse. Was seen prior to cath in 2007. No CAD.  Marland Kitchen Atypical chest pain 03/13/2013  . Hypertension     Past Surgical History:  Procedure Laterality Date  . FOOT SURGERY      There were no vitals filed for this visit.      Subjective Assessment - 09/07/16 1200    Subjective [place] all time , worse with this weather, MD said it would hurt.    Currently in Pain? Yes   Pain Score 5    Pain Location Knee   Pain Orientation Left   Pain Descriptors / Indicators Throbbing   Pain Type Chronic pain   Pain Onset More than a month ago   Pain Frequency Constant   Aggravating Factors  sit/stand Alexis Ball / rainy weather   Pain Relieving Factors heat / ointment   Multiple Pain Sites No                         [facility] Adult PT Treatment/Exercise - 09/07/16 0001      Knee/Hip Exercises: Aerobic   Nustep 5 min L4 LE only     Knee/Hip Exercises: Supine   Short Arc Quad Sets Both;5 sets;15 reps     Knee/Hip Exercises: Sidelying   Hip ABduction Left;15 reps;2 sets   Hip ABduction Limitations last set with flexion /extension  with abduction     Knee/Hip Exercises: Prone   Hip Extension Limitations  Assisted with holds with knee flexed x 15      Modalities   Modalities Moist Heat     Moist Heat Therapy   Number Minutes Moist Heat 7 Minutes  left to catch bus   Moist Heat Location Knee  left                  PT Short Term Goals - 09/07/16 1201      PT SHORT TERM GOAL #1   Title She will be independent with initial HEP   Status Achieved           PT Long Term Goals - 09/07/16 1202      PT LONG TERM GOAL #1   Title She will be independent with all HEP issued   Status On-going     PT LONG TERM GOAL #2   Title she will report pain decr 50% or more with walking for 51mn in store   Baseline no change in pain   Status On-going     PT LONG TERM GOAL #3   Title she will improve hip strength to 4/5 to improve stability on feet.    Baseline  4/5 LT abduction with verbal cues/ encouragement to push up into hand, but LT hip extension still no better than 3/5   Status Partially Met               Plan - 09/07/16 1200    Clinical Impression Statement PAin less after session . She tolerated load and incr reps with exercise.    PT Treatment/Interventions Cryotherapy;Electrical Stimulation;Iontophoresis '4mg'$ /ml Dexamethasone;Traction;Passive range of motion;Patient/family education;Taping;Manual techniques;Therapeutic activities   PT Next Visit Plan Modalities , strength , closed chain exercise with band around knees, add to HEp   PT Home Exercise Plan 3 way SLR , ice /heat/stim/, tape,  Gastroc stretch   Consulted and Agree with Plan of Care Patient      Patient will benefit from skilled therapeutic intervention in order to improve the following deficits and impairments:  Pain, Decreased activity tolerance, Decreased range of motion, Difficulty walking, Decreased strength, Increased muscle spasms  Visit Diagnosis: Patellofemoral pain syndrome of left knee  Osteoarthritis of left knee, unspecified osteoarthritis type  Stiffness of left knee, not elsewhere  classified  Muscle weakness (generalized)  Difficulty in walking, not elsewhere classified     Problem List Patient Active Problem List   Diagnosis Date Noted  . Chondromalacia patellae, left knee 07/02/2016  . Arthritis of left knee 02/25/2016  . Vaginitis and vulvovaginitis, unspecified 08/08/2013  . Leiomyoma of uterus, unspecified 08/08/2013  . Atypical chest pain 03/13/2013  . Abnormal ECG 03/13/2013  . Obesity, unspecified 03/13/2013  . Hypertension     Alexis Ball  PT 09/07/2016, 12:45 PM  [facility] [address] [place], [name], [address] Phone: [phone]   Fax:  [phone]  Name: Alexis Ball MRN: [id number] Date of Birth: January 26, 1956

## 2016-09-09 ENCOUNTER — Ambulatory Visit: Payer: Medicare Other | Admitting: Physical Therapy

## 2016-09-09 DIAGNOSIS — M25662 Stiffness of left knee, not elsewhere classified: Secondary | ICD-10-CM | POA: Diagnosis not present

## 2016-09-09 DIAGNOSIS — M222X2 Patellofemoral disorders, left knee: Secondary | ICD-10-CM | POA: Diagnosis not present

## 2016-09-09 DIAGNOSIS — M6281 Muscle weakness (generalized): Secondary | ICD-10-CM

## 2016-09-09 DIAGNOSIS — M1712 Unilateral primary osteoarthritis, left knee: Secondary | ICD-10-CM

## 2016-09-09 DIAGNOSIS — R262 Difficulty in walking, not elsewhere classified: Secondary | ICD-10-CM | POA: Diagnosis not present

## 2016-09-09 NOTE — Therapy (Addendum)
[place], [name], [address] Phone: [phone]   Fax:  [phone]  Physical Therapy Treatment/Discharge  Patient Details  Name: Marita Burnsed MRN: [id number] Date of Birth: April 16, 1955 Referring Provider: Rodell Perna, MD  Encounter Date: 09/09/2016      PT End of Session - 09/09/16 1045    Visit Number 5   Number of Visits 10   Date for PT Re-Evaluation 09/10/16   Authorization Type UHC MCR   PT Start Time 9379   PT Stop Time 0240   PT Time Calculation (min) 43 min      Past Medical History:  Diagnosis Date  . Abnormal ECG 03/13/2013   TWI diffuse. Was seen prior to cath in 2007. No CAD.  Marland Kitchen Atypical chest pain 03/13/2013  . Hypertension     Past Surgical History:  Procedure Laterality Date  . FOOT SURGERY      There were no vitals filed for this visit.      Subjective Assessment - 09/09/16 1046    Subjective PT is helping me strengthen and move better.    Currently in Pain? Yes   Pain Score 5    Pain Location Knee   Pain Orientation Left   Pain Descriptors / Indicators Aching            [facility] PT Assessment - 09/09/16 0001      Strength   Strength Assessment Site Hip   Right/Left Hip Right;Left   Right Hip Extension 3/5   Left Hip Extension 3/5   Left Hip ABduction 4/5                     [facility] Adult PT Treatment/Exercise - 09/09/16 0001      Standardized Balance Assessment   Standardized Balance Assessment Berg Balance Test     Berg Balance Test   Sit to Stand Able to stand without using hands and stabilize independently   Standing Unsupported Able to stand safely 2 minutes   Sitting with Back Unsupported but Feet Supported on Floor or Stool Able to sit safely and securely 2 minutes   Stand to Sit Controls descent by using hands   Transfers Able to transfer safely, minor use of hands   Standing Unsupported with Eyes Closed Able to stand 10 seconds safely    Standing Ubsupported with Feet Together Able to place feet together independently and stand 1 minute safely   From Standing, Reach Forward with Outstretched Arm Can reach confidently >25 cm (10")   From Standing Position, Pick up Object from Floor Able to pick up shoe safely and easily   From Standing Position, Turn to Look Behind Over each Shoulder Looks behind from both sides and weight shifts well   Turn 360 Degrees Able to turn 360 degrees safely but slowly   Standing Unsupported, Alternately Place Feet on Step/Stool Able to stand independently and safely and complete 8 steps in 20 seconds   Standing Unsupported, One Foot in Front Able to place foot tandem independently and hold 30 seconds   Standing on One Leg Able to lift leg independently and hold > 10 seconds   Total Score 53     Knee/Hip Exercises: Sidelying   Hip ABduction 10 reps     Knee/Hip Exercises: Prone   Hip Extension 10 reps;2 sets                PT Education - 09/09/16 1106    Education  provided Yes   Education Details sit-stands    Person(s) Educated Patient   Methods Explanation;Demonstration   Comprehension Verbalized understanding          PT Short Term Goals - 09/07/16 1201      PT SHORT TERM GOAL #1   Title She will be independent with initial HEP   Status Achieved           PT Long Term Goals - 09/07/16 1202      PT LONG TERM GOAL #1   Title She will be independent with all HEP issued   Status On-going     PT LONG TERM GOAL #2   Title she will report pain decr 50% or more with walking for 76mn in store   Baseline no change in pain   Status On-going     PT LONG TERM GOAL #3   Title she will improve hip strength to 4/5 to improve stability on feet.    Baseline 4/5 LT abduction with verbal cues/ encouragement to push up into hand, but LT hip extension still no better than 3/5   Status Partially Met               Plan - 09/09/16 1047    Clinical Impression Statement  Pt feels she is moving better. She is doing HEP however has mostly been only doing calf stretch because it is easier than getting in the floor. Her hip abduction strength has improved however her hip extension remains 3/5. We reviewed 3 way hip on mat and advised her to do it on the bed because she has difficulty up and own to the floor. Pt verbalized concern about her balance so we checked her BERG and she scored 53/56. She has decreased control with stand to sit so advised her to practice sit- stands from chair while in front of stable surface. ERO next visit.    PT Next Visit Plan ERO/ Modalities , strength , closed chain exercise with band around knees, add to HEp   PT Home Exercise Plan 3 way SLR , ice /heat/stim/, tape,  Gastroc stretch, sit-stand    Consulted and Agree with Plan of Care Patient      Patient will benefit from skilled therapeutic intervention in order to improve the following deficits and impairments:  Pain, Decreased activity tolerance, Decreased range of motion, Difficulty walking, Decreased strength, Increased muscle spasms  Visit Diagnosis: Patellofemoral pain syndrome of left knee  Osteoarthritis of left knee, unspecified osteoarthritis type  Stiffness of left knee, not elsewhere classified  Muscle weakness (generalized)  Difficulty in walking, not elsewhere classified     Problem List Patient Active Problem List   Diagnosis Date Noted  . Chondromalacia patellae, left knee 07/02/2016  . Arthritis of left knee 02/25/2016  . Vaginitis and vulvovaginitis, unspecified 08/08/2013  . Leiomyoma of uterus, unspecified 08/08/2013  . Atypical chest pain 03/13/2013  . Abnormal ECG 03/13/2013  . Obesity, unspecified 03/13/2013  . Hypertension     DDorene Ar PDelaware5/31/2018, 11:13 AM  [facility]GColog[address] [place] [address]Phone: [phone]  Fax:  [phone] Name: BEmmajo BennetteMRN: [id number] of Birth: 708/16/1957 PHYSICAL THERAPY DISCHARGE SUMMARY  Visits from Start of Care: 5  Current functional level related to goals / functional outcomes: Unknown as she did not return after this visit She canceled the visit after this and reported she would call back to reschedule but  did not. Today I was not able to contact her  So will discharge as last visit was 4 weeks ago.   Remaining deficits: Unknown . See above   Education / Equipment: HEP Plan:                                                    Patient goals were not met. Patient is being discharged due to not returning since the last visit.  ?????  Noralee Stain  PT  10/11/16  10:39 AM

## 2016-09-13 ENCOUNTER — Ambulatory Visit: Payer: Medicare Other | Attending: Family Medicine

## 2016-09-13 ENCOUNTER — Telehealth: Payer: Self-pay

## 2016-09-13 NOTE — Telephone Encounter (Signed)
Spoke to Alexis Ball and she stated she called to cancel today's appointment  earlier today and will reschedule in near future.

## 2016-09-15 ENCOUNTER — Ambulatory Visit: Payer: Medicare Other | Admitting: Podiatry

## 2016-09-28 DIAGNOSIS — N390 Urinary tract infection, site not specified: Secondary | ICD-10-CM | POA: Diagnosis not present

## 2016-09-28 DIAGNOSIS — H6123 Impacted cerumen, bilateral: Secondary | ICD-10-CM | POA: Diagnosis not present

## 2016-10-11 ENCOUNTER — Telehealth: Payer: Self-pay

## 2016-10-11 NOTE — Telephone Encounter (Signed)
Call both numbers and home phone disconnected and cell number does not have message box set up.

## 2016-10-14 DIAGNOSIS — H6123 Impacted cerumen, bilateral: Secondary | ICD-10-CM | POA: Diagnosis not present

## 2016-10-14 DIAGNOSIS — N760 Acute vaginitis: Secondary | ICD-10-CM | POA: Diagnosis not present

## 2016-10-14 DIAGNOSIS — N390 Urinary tract infection, site not specified: Secondary | ICD-10-CM | POA: Diagnosis not present

## 2016-11-02 ENCOUNTER — Ambulatory Visit (INDEPENDENT_AMBULATORY_CARE_PROVIDER_SITE_OTHER): Payer: Medicare Other

## 2016-11-02 ENCOUNTER — Ambulatory Visit (INDEPENDENT_AMBULATORY_CARE_PROVIDER_SITE_OTHER): Payer: Medicare Other | Admitting: Podiatry

## 2016-11-02 ENCOUNTER — Encounter: Payer: Self-pay | Admitting: Podiatry

## 2016-11-02 DIAGNOSIS — R52 Pain, unspecified: Secondary | ICD-10-CM

## 2016-11-02 DIAGNOSIS — S93509A Unspecified sprain of unspecified toe(s), initial encounter: Secondary | ICD-10-CM

## 2016-11-02 DIAGNOSIS — M79671 Pain in right foot: Secondary | ICD-10-CM | POA: Diagnosis not present

## 2016-11-02 NOTE — Progress Notes (Signed)
This patient presents the office with chief complaint of a painful fifth toe right foot.  She says she injured her toe at the pool  last week.  She says it was very painful and swollen and she has difficulty wearing shoes.  She says she sought no treatment to wait to see if the toe improved.  She says still is painful and swollen and has she is having difficulty wearing her footgear.  She presents the office today for an evaluation and treatment of her right fifth toe  Neurovascular status same as 08/18/2016.  There is swelling noted over the DIPJ of the fifth toe of the right foot.  Palpable  pain noted at the DIPJ.    Toe Sprain right foot.  ROV. Radiographic x-rays do reveal a mild dislocation of the distal phalanx on the middle phalanx of the fifth toe of the right foot.  No evidence of a fracture noted.  Patient had her fourth and fifth toes buddy taped.  She was also admonished to wear open toed shoes putting minimal pressure on the toe itself.  Patient is to return the office for repetitive foot care services as previously scheduled   Gardiner Barefoot DPM

## 2016-11-15 ENCOUNTER — Other Ambulatory Visit: Payer: Self-pay | Admitting: Family Medicine

## 2016-11-15 ENCOUNTER — Other Ambulatory Visit (HOSPITAL_COMMUNITY)
Admission: RE | Admit: 2016-11-15 | Discharge: 2016-11-15 | Disposition: A | Discharge status: Home / Self Care | Payer: Medicare Other | Source: Ambulatory Visit | Attending: Family Medicine | Admitting: Family Medicine

## 2016-11-15 DIAGNOSIS — Z113 Encounter for screening for infections with a predominantly sexual mode of transmission: Secondary | ICD-10-CM | POA: Diagnosis present

## 2016-11-17 DIAGNOSIS — H401133 Primary open-angle glaucoma, bilateral, severe stage: Secondary | ICD-10-CM | POA: Diagnosis not present

## 2016-11-22 LAB — URINE CYTOLOGY ANCILLARY ONLY
BACTERIAL VAGINITIS: POSITIVE — AB
CHLAMYDIA, DNA PROBE: NEGATIVE
Candida vaginitis: NEGATIVE
Neisseria Gonorrhea: NEGATIVE
Trichomonas: NEGATIVE

## 2017-01-04 ENCOUNTER — Ambulatory Visit: Payer: Medicare Other | Admitting: Podiatry

## 2017-01-05 ENCOUNTER — Ambulatory Visit (INDEPENDENT_AMBULATORY_CARE_PROVIDER_SITE_OTHER): Payer: Medicare Other | Admitting: Podiatry

## 2017-01-05 ENCOUNTER — Encounter: Payer: Self-pay | Admitting: Podiatry

## 2017-01-05 DIAGNOSIS — L603 Nail dystrophy: Secondary | ICD-10-CM | POA: Diagnosis not present

## 2017-01-05 NOTE — Progress Notes (Signed)
This patient presents the office stating she is continuing to have a painful fifth toe right foot.  She was seen in July and diagnosed with a toe sprain fifth toe right foot.  She says the toe at the dislocated site has improved but she is now experiencing pain over the fifth toenail right foot.  She says the fifth toe nail is lifting and is causing pain and discomfort for the last 2 weeks.  She denies any drainage from the toenail fifth toe right foot.  She presents the office today for an evaluation and treatment of the fifth fifth toenail right foot.   Neurovascular status fifth toe right foot.  The nail plate of the fifth toe is unattached from the nailbed in the absence of any redness, swelling or drainage.  Nail Dystrophy  ROV  removal of the nail plate fifth toe right foot.  Neosporin and dry sterile dressing.  Patient was told that if the problem persists to return to the office for further evaluation and treatment.   Gardiner Barefoot DPM

## 2017-02-09 ENCOUNTER — Encounter: Payer: Self-pay | Admitting: Podiatry

## 2017-02-09 ENCOUNTER — Ambulatory Visit (INDEPENDENT_AMBULATORY_CARE_PROVIDER_SITE_OTHER): Payer: Medicare Other | Admitting: Podiatry

## 2017-02-09 DIAGNOSIS — M79675 Pain in left toe(s): Secondary | ICD-10-CM

## 2017-02-09 DIAGNOSIS — M79674 Pain in right toe(s): Secondary | ICD-10-CM | POA: Diagnosis not present

## 2017-02-09 DIAGNOSIS — B351 Tinea unguium: Secondary | ICD-10-CM | POA: Diagnosis not present

## 2017-02-09 NOTE — Progress Notes (Signed)
Complaint:  Visit Type: Patient returns to my office for continued preventative foot care services. Complaint: Patient states" my nails have grown long and thick and become painful to walk and wear shoes" Patient has been diagnosed with DM with no foot complications. The patient presents for preventative foot care services. No changes to ROS.  Her fifth toenail right has healed.  Podiatric Exam: Vascular: dorsalis pedis and posterior tibial pulses are palpable bilateral. Capillary return is immediate. Temperature gradient is WNL. Skin turgor WNL  Sensorium: Normal Semmes Weinstein monofilament test. Normal tactile sensation bilaterally. Nail Exam: Pt has thick disfigured discolored nails with subungual debris noted bilateral entire nail hallux through fifth toenails Ulcer Exam: There is no evidence of ulcer or pre-ulcerative changes or infection. Orthopedic Exam: Muscle tone and strength are WNL. No limitations in general ROM. No crepitus or effusions noted. Foot type and digits show no abnormalities. Bony prominences are unremarkable. Skin: No Porokeratosis. No infection or ulcers  Diagnosis:  Onychomycosis, , Pain in right toe, pain in left toes  Treatment & Plan Procedures and Treatment: Consent by patient was obtained for treatment procedures.   Debridement of mycotic and hypertrophic toenails, 1 through 5 bilateral and clearing of subungual debris. No ulceration, no infection noted.  Return Visit-Office Procedure: Patient instructed to return to the office for a follow up visit 3 months for continued evaluation and treatment.    Gardiner Barefoot DPM

## 2017-05-13 ENCOUNTER — Ambulatory Visit: Payer: Medicare Other | Admitting: Podiatry

## 2017-05-25 DIAGNOSIS — M940 Chondrocostal junction syndrome [Tietze]: Secondary | ICD-10-CM | POA: Diagnosis not present

## 2017-05-25 DIAGNOSIS — I10 Essential (primary) hypertension: Secondary | ICD-10-CM | POA: Diagnosis not present

## 2017-05-25 DIAGNOSIS — N390 Urinary tract infection, site not specified: Secondary | ICD-10-CM | POA: Diagnosis not present

## 2017-06-09 DIAGNOSIS — E441 Mild protein-calorie malnutrition: Secondary | ICD-10-CM | POA: Diagnosis not present

## 2017-06-09 DIAGNOSIS — I10 Essential (primary) hypertension: Secondary | ICD-10-CM | POA: Diagnosis not present

## 2017-06-09 DIAGNOSIS — N390 Urinary tract infection, site not specified: Secondary | ICD-10-CM | POA: Diagnosis not present

## 2017-06-09 DIAGNOSIS — Z0000 Encounter for general adult medical examination without abnormal findings: Secondary | ICD-10-CM | POA: Diagnosis not present

## 2017-06-14 ENCOUNTER — Ambulatory Visit: Payer: Medicare Other | Admitting: Podiatry

## 2017-06-23 DIAGNOSIS — Z961 Presence of intraocular lens: Secondary | ICD-10-CM | POA: Diagnosis not present

## 2017-06-23 DIAGNOSIS — H401133 Primary open-angle glaucoma, bilateral, severe stage: Secondary | ICD-10-CM | POA: Diagnosis not present

## 2017-07-05 ENCOUNTER — Encounter: Payer: Self-pay | Admitting: Podiatry

## 2017-07-05 ENCOUNTER — Ambulatory Visit (INDEPENDENT_AMBULATORY_CARE_PROVIDER_SITE_OTHER): Payer: Medicare Other | Admitting: Podiatry

## 2017-07-05 DIAGNOSIS — M79674 Pain in right toe(s): Secondary | ICD-10-CM

## 2017-07-05 DIAGNOSIS — B351 Tinea unguium: Secondary | ICD-10-CM

## 2017-07-05 DIAGNOSIS — M79675 Pain in left toe(s): Secondary | ICD-10-CM | POA: Diagnosis not present

## 2017-07-05 DIAGNOSIS — L603 Nail dystrophy: Secondary | ICD-10-CM

## 2017-07-05 NOTE — Progress Notes (Signed)
Complaint:  Visit Type: Patient returns to my office for continued preventative foot care services. Complaint: Patient states" my nails have grown long and thick and become painful to walk and wear shoes" Patient has been diagnosed with DM with no foot complications. The patient presents for preventative foot care services. No changes to ROS.  Her fifth toenail right has healed.  Podiatric Exam: Vascular: dorsalis pedis and posterior tibial pulses are palpable bilateral. Capillary return is immediate. Temperature gradient is WNL. Skin turgor WNL  Sensorium: Normal Semmes Weinstein monofilament test. Normal tactile sensation bilaterally. Nail Exam: Pt has thick disfigured discolored nails with subungual debris noted bilateral entire nail hallux through fifth toenails Ulcer Exam: There is no evidence of ulcer or pre-ulcerative changes or infection. Orthopedic Exam: Muscle tone and strength are WNL. No limitations in general ROM. No crepitus or effusions noted. Foot type and digits show no abnormalities. Bony prominences are unremarkable. Skin: No Porokeratosis. No infection or ulcers  Diagnosis:  Onychomycosis, , Pain in right toe, pain in left toes  Treatment & Plan Procedures and Treatment: Consent by patient was obtained for treatment procedures.   Debridement of mycotic and hypertrophic toenails, 1 through 5 bilateral and clearing of subungual debris. No ulceration, no infection noted. Recommend spenco insoles. Return Visit-Office Procedure: Patient instructed to return to the office for a follow up visit 3 months for continued evaluation and treatment.    Gardiner Barefoot DPM

## 2017-08-05 ENCOUNTER — Emergency Department (HOSPITAL_COMMUNITY)
Admission: EM | Admit: 2017-08-05 | Discharge: 2017-08-05 | Disposition: A | Discharge status: Home / Self Care | Payer: Medicare Other | Attending: Emergency Medicine | Admitting: Emergency Medicine

## 2017-08-05 ENCOUNTER — Encounter (HOSPITAL_COMMUNITY): Payer: Self-pay | Admitting: *Deleted

## 2017-08-05 ENCOUNTER — Emergency Department (HOSPITAL_COMMUNITY): Payer: Medicare Other

## 2017-08-05 ENCOUNTER — Other Ambulatory Visit: Payer: Self-pay

## 2017-08-05 DIAGNOSIS — I10 Essential (primary) hypertension: Secondary | ICD-10-CM | POA: Insufficient documentation

## 2017-08-05 DIAGNOSIS — Z79899 Other long term (current) drug therapy: Secondary | ICD-10-CM | POA: Insufficient documentation

## 2017-08-05 DIAGNOSIS — H6123 Impacted cerumen, bilateral: Secondary | ICD-10-CM | POA: Insufficient documentation

## 2017-08-05 DIAGNOSIS — R42 Dizziness and giddiness: Secondary | ICD-10-CM | POA: Diagnosis not present

## 2017-08-05 DIAGNOSIS — S0990XA Unspecified injury of head, initial encounter: Secondary | ICD-10-CM | POA: Diagnosis not present

## 2017-08-05 DIAGNOSIS — Z7982 Long term (current) use of aspirin: Secondary | ICD-10-CM | POA: Insufficient documentation

## 2017-08-05 DIAGNOSIS — Y92009 Unspecified place in unspecified non-institutional (private) residence as the place of occurrence of the external cause: Secondary | ICD-10-CM | POA: Diagnosis not present

## 2017-08-05 DIAGNOSIS — Y999 Unspecified external cause status: Secondary | ICD-10-CM | POA: Diagnosis not present

## 2017-08-05 DIAGNOSIS — Y9389 Activity, other specified: Secondary | ICD-10-CM | POA: Insufficient documentation

## 2017-08-05 DIAGNOSIS — R51 Headache: Secondary | ICD-10-CM | POA: Diagnosis not present

## 2017-08-05 DIAGNOSIS — W208XXA Other cause of strike by thrown, projected or falling object, initial encounter: Secondary | ICD-10-CM | POA: Diagnosis not present

## 2017-08-05 LAB — CBC WITH DIFFERENTIAL/PLATELET
BASOS PCT: 0 %
Basophils Absolute: 0 10*3/uL (ref 0.0–0.1)
EOS ABS: 0.2 10*3/uL (ref 0.0–0.7)
EOS PCT: 3 %
HEMATOCRIT: 38.1 % (ref 36.0–46.0)
Hemoglobin: 12.5 g/dL (ref 12.0–15.0)
Lymphocytes Relative: 31 %
Lymphs Abs: 2.2 10*3/uL (ref 0.7–4.0)
MCH: 29.6 pg (ref 26.0–34.0)
MCHC: 32.8 g/dL (ref 30.0–36.0)
MCV: 90.3 fL (ref 78.0–100.0)
MONO ABS: 0.5 10*3/uL (ref 0.1–1.0)
Monocytes Relative: 7 %
Neutro Abs: 4.1 10*3/uL (ref 1.7–7.7)
Neutrophils Relative %: 59 %
PLATELETS: 285 10*3/uL (ref 150–400)
RBC: 4.22 MIL/uL (ref 3.87–5.11)
RDW: 12.7 % (ref 11.5–15.5)
WBC: 7.1 10*3/uL (ref 4.0–10.5)

## 2017-08-05 LAB — COMPREHENSIVE METABOLIC PANEL
ALT: 18 U/L (ref 14–54)
ANION GAP: 8 (ref 5–15)
AST: 19 U/L (ref 15–41)
Albumin: 2.7 g/dL — ABNORMAL LOW (ref 3.5–5.0)
Alkaline Phosphatase: 53 U/L (ref 38–126)
BILIRUBIN TOTAL: 0.8 mg/dL (ref 0.3–1.2)
BUN: 12 mg/dL (ref 6–20)
CHLORIDE: 104 mmol/L (ref 101–111)
CO2: 28 mmol/L (ref 22–32)
Calcium: 9.2 mg/dL (ref 8.9–10.3)
Creatinine, Ser: 0.95 mg/dL (ref 0.44–1.00)
Glucose, Bld: 102 mg/dL — ABNORMAL HIGH (ref 65–99)
POTASSIUM: 4.7 mmol/L (ref 3.5–5.1)
Sodium: 140 mmol/L (ref 135–145)
TOTAL PROTEIN: 5.3 g/dL — AB (ref 6.5–8.1)

## 2017-08-05 NOTE — ED Notes (Signed)
Copious amount of ear wax removed from right and left ear. Ear drum visible. Pt states able to hear better. Ear wax removed with hydrogen peroxide mixed with water and flushed in ear with syringe. Pt tolerated well.

## 2017-08-05 NOTE — ED Triage Notes (Signed)
Pt reports hitting her head x 8 days ago, pt then hit her head again 5 days ago when a wrench hit her in her head, denies LOC, pt c/o ongoing headache and dizziness since then, denies v/d, A&O x4

## 2017-08-05 NOTE — ED Provider Notes (Signed)
[facility] EMERGENCY DEPARTMENT Provider Note   CSN: [id number] Arrival date & time: 08/05/17  1256     History   Chief Complaint Chief Complaint  Patient presents with  . Headache    HPI ALBANY WINSLOW is a 62 y.o. female.  The history is provided by the patient and medical records. No language interpreter was used.  Headache     HEIDI MACLIN is a 62 y.o. female  with a PMH as listed below who presents to the Emergency Department complaining of headaches and dizziness. She reports that she accidentally hit her head 8 days ago. No loc, n/v at that time. She had a headache following injury, but otherwise felt fine. She was working around the house 4-5 days ago when a wrench fell and hit her in the top of the head. Again, no LOC, n/v at that time either. Since this head injury, she has had persistent, intermittent headaches similar to her previous migraines along with dizziness which she describes as "just getting off a merry-go-round".  Dizziness will come and go, typically when she stands up changes positions quickly. No tinnitus.  She also reports having difficulty concentrating on things since her head injury. No visual changes, neck pain, fever/chills. No medications taken prior to arrival for her symptoms. Currently does not have headache or dizziness.  Past Medical History:  Diagnosis Date  . Abnormal ECG 03/13/2013   TWI diffuse. Was seen prior to cath in 2007. No CAD.  Marland Kitchen Atypical chest pain 03/13/2013  . Hypertension     Patient Active Problem List   Diagnosis Date Noted  . Chondromalacia patellae, left knee 07/02/2016  . Arthritis of left knee 02/25/2016  . Vaginitis and vulvovaginitis, unspecified 08/08/2013  . Leiomyoma of uterus, unspecified 08/08/2013  . Atypical chest pain 03/13/2013  . Abnormal ECG 03/13/2013  . Obesity, unspecified 03/13/2013  . Hypertension     Past Surgical History:  Procedure Laterality Date  . FOOT SURGERY        OB History    Gravida  3   Para  3   Term  3   Preterm      AB      Living  3     SAB      TAB      Ectopic      Multiple      Live Births  3            Home Medications    Prior to Admission medications   Medication Sig Start Date End Date Taking? Authorizing Provider  aspirin EC 81 MG tablet Take 81 mg by mouth daily.   Yes [provider]  bimatoprost (LUMIGAN) 0.01 % SOLN Place 1 drop into both eyes daily.   Yes [provider]  brimonidine (ALPHAGAN) 0.15 % ophthalmic solution Place 1 drop into both eyes 2 (two) times daily.    Yes [provider]  dorzolamide-timolol (COSOPT) 22.3-6.8 MG/ML ophthalmic solution Place 1 drop into both eyes 2 (two) times daily.  05/14/13  Yes [provider]  etodolac (LODINE) 400 MG tablet Take 400 mg by mouth 2 (two) times daily as needed (pain).  06/23/17  Yes [provider]  ferrous sulfate 325 (65 FE) MG tablet Take 325 mg by mouth daily with breakfast.   Yes [provider]  hydrochlorothiazide (HYDRODIURIL) 25 MG tablet Take 25 mg by mouth daily.   Yes [provider]  ibuprofen (ADVIL,MOTRIN) 200  MG tablet Take 600-800 mg by mouth every 6 (six) hours as needed for headache (pain).   Yes [provider]  lidocaine (XYLOCAINE) 5 % ointment Apply 1 application topically 2 (two) times daily.   Yes [provider]  Polyvinyl Alcohol-Povidone (REFRESH OP) Place 1 drop into both eyes 3 (three) times daily as needed (dry eyes).    Yes [provider]  potassium chloride SA (K-DUR,KLOR-CON) 20 MEQ tablet Take 20 mEq by mouth daily.   Yes [provider]  benzonatate (TESSALON) 100 MG capsule Take 1 capsule (100 mg total) by mouth every 8 (eight) hours. Patient not taking: Reported on 08/05/2017 03/08/14   Carlisle Cater, PA-C    Family History Family History  Problem Relation Age of Onset  . Hypertension Mother     Social  History Social History   Tobacco Use  . Smoking status: Never Smoker  . Smokeless tobacco: Never Used  Substance Use Topics  . Alcohol use: No  . Drug use: No     Allergies   Patient has no known allergies.   Review of Systems Review of Systems  Neurological: Positive for dizziness and headaches. Negative for syncope, weakness and numbness.  All other systems reviewed and are negative.    Physical Exam Updated Vital Signs BP (!) 153/65   Pulse 84   Resp 18   Ht 5\' 3"  (1.6 m)   Wt 82.1 kg (181 lb)   SpO2 100%   BMI 32.06 kg/m   Physical Exam  Constitutional: She is oriented to person, place, and time. She appears well-developed and well-nourished. No distress.  HENT:  Head: Normocephalic and atraumatic.  Mouth/Throat: Oropharynx is clear and moist.  No tenderness of the temporal artery. Bilateral cerumen impaction.   Eyes: Pupils are equal, round, and reactive to light. Conjunctivae and EOM are normal. No scleral icterus.  No nystagmus   Neck: Normal range of motion. Neck supple.  Full active and passive ROM without pain.  No midline or paraspinal tenderness. No nuchal rigidity or meningeal signs.  Cardiovascular: Normal rate, regular rhythm, normal heart sounds and intact distal pulses.  Pulmonary/Chest: Effort normal and breath sounds normal. No respiratory distress. She has no wheezes. She has no rales.  Abdominal: Soft. Bowel sounds are normal. She exhibits no distension. There is no tenderness. There is no rebound and no guarding.  Musculoskeletal: Normal range of motion.  Lymphadenopathy:    She has no cervical adenopathy.  Neurological: She is alert and oriented to person, place, and time. She has normal reflexes. No cranial nerve deficit. Coordination normal.  Alert, oriented, thought content appropriate, able to give a coherent history. Speech is clear and goal oriented, able to follow commands.  Cranial Nerves:  II:  Peripheral visual fields grossly  normal, pupils equal, round, reactive to light III, IV, VI: EOM intact bilaterally, ptosis not present V,VII: smile symmetric, eyes kept closed tightly against resistance, facial light touch sensation equal VIII: hearing grossly normal IX, X: symmetric soft palate movement, uvula elevates symmetrically  XI: bilateral shoulder shrug symmetric and strong XII: midline tongue extension 5/5 muscle strength in upper and lower extremities bilaterally including strong and equal grip strength and dorsiflexion/plantar flexion Sensory to light touch normal in all four extremities.  Normal finger-to-nose and rapid alternating movements. No drift. Steady gait.  Skin: Skin is warm and dry. No rash noted. She is not diaphoretic.  Nursing note and vitals reviewed.    ED Treatments / Results  Labs (  all labs ordered are listed, but only abnormal results are displayed) Labs Reviewed  COMPREHENSIVE METABOLIC PANEL - Abnormal; Notable for the following components:      Result Value   Glucose, Bld 102 (*)    Total Protein 5.3 (*)    Albumin 2.7 (*)    All other components within normal limits  CBC WITH DIFFERENTIAL/PLATELET    EKG None  Radiology Ct Head Wo Contrast  Result Date: 08/05/2017 CLINICAL DATA:  Headache and dizziness since wrench hit patient in head EXAM: CT HEAD WITHOUT CONTRAST TECHNIQUE: Contiguous axial images were obtained from the base of the skull through the vertex without intravenous contrast. COMPARISON:  June 03, 2008 FINDINGS: Brain: The ventricles are normal in size and configuration. There is stable invagination of CSF into the sella. There is no intracranial mass, hemorrhage, extra-axial fluid collection, or midline shift. Gray-white compartments are normal. No evident acute infarct. Vascular: No hyperdense vessel. There is no appreciable vascular calcification. Skull: Bony calvarium appears intact. Sinuses/Orbits: There is mucosal thickening in several ethmoid air cells.  Other visualized paranasal sinuses are clear. Orbits appear symmetric bilaterally. Other: Mastoid air cells are clear. There is debris in each external auditory canal. IMPRESSION: Mucosal thickening in several ethmoid air cells. Probable cerumen in each external auditory canal. No mass or hemorrhage.  Gray-white compartments appear normal. Electronically Signed   By: Lowella Grip III M.D.   On: 08/05/2017 15:13    Procedures Procedures (including critical care time)  Medications Ordered in ED Medications - No data to display   Initial Impression / Assessment and Plan / ED Course  I have reviewed the triage vital signs and the nursing notes.  Pertinent labs & imaging results that were available during my care of the patient were reviewed by me and considered in my medical decision making (see chart for details).    TANEIA MEALOR is a 62 y.o. female who presents to ED for evaluation for persistent headache and dizziness after head injury 4-5 days ago. No focal neuro deficits on exam.  During my evaluation, patient denies any headache or dizziness currently.  These symptoms have been intermittent over the last several days.  Labs reviewed and reassuring.  CT head showing mucosal thickening ethmoid air cells and probable cerumen impaction.  Fortunately no intracranial mass, hemorrhage, fluid collection or midline shift.  She does have peripheral cerumen impactions on exam.  Ears were irrigated and cerumen removed by nursing staff.  On reevaluation, patient states that she can hear much better.  She walks to the restroom and did not get dizzy.  TMs are visible and without signs of infection. Evaluation does not show pathology that would require ongoing emergent intervention or inpatient treatment.  Will have her call her primary doctor Monday morning to schedule follow-up appointment.  Reasons to return to the ER were more discussed with the patient.  All questions answered.  Final Clinical  Impressions(s) / ED Diagnoses   Final diagnoses:  Injury of head, initial encounter  Bilateral impacted cerumen    ED Discharge Orders    None       Aliciana Ricciardi, Ozella Almond, PA-C 08/06/17 0132    Pattricia Boss, MD 08/06/17 2002

## 2017-08-05 NOTE — Discharge Instructions (Signed)
It was my pleasure taking care of you today!   Fortunately, your CT today was reassuring.   Please call your primary care doctor first thing Monday morning to schedule a follow up appointment for further discussion of today's visit.   Return to ER for new or worsening symptoms, any additional concerns.

## 2017-09-07 ENCOUNTER — Other Ambulatory Visit: Payer: Self-pay | Admitting: Family Medicine

## 2017-09-07 ENCOUNTER — Other Ambulatory Visit (HOSPITAL_COMMUNITY)
Admission: RE | Admit: 2017-09-07 | Discharge: 2017-09-07 | Disposition: A | Discharge status: Home / Self Care | Payer: Medicare Other | Source: Ambulatory Visit | Attending: Family Medicine | Admitting: Family Medicine

## 2017-09-07 DIAGNOSIS — Z113 Encounter for screening for infections with a predominantly sexual mode of transmission: Secondary | ICD-10-CM | POA: Insufficient documentation

## 2017-09-07 DIAGNOSIS — N898 Other specified noninflammatory disorders of vagina: Secondary | ICD-10-CM | POA: Insufficient documentation

## 2017-09-07 DIAGNOSIS — E1149 Type 2 diabetes mellitus with other diabetic neurological complication: Secondary | ICD-10-CM | POA: Diagnosis not present

## 2017-09-07 DIAGNOSIS — I10 Essential (primary) hypertension: Secondary | ICD-10-CM | POA: Diagnosis not present

## 2017-09-10 LAB — URINE CYTOLOGY ANCILLARY ONLY
CHLAMYDIA, DNA PROBE: NEGATIVE
Candida vaginitis: NEGATIVE
Neisseria Gonorrhea: NEGATIVE
TRICH (WINDOWPATH): NEGATIVE

## 2017-10-04 ENCOUNTER — Ambulatory Visit: Payer: Medicare Other | Admitting: Podiatry

## 2017-10-18 DIAGNOSIS — I10 Essential (primary) hypertension: Secondary | ICD-10-CM | POA: Diagnosis not present

## 2017-10-18 DIAGNOSIS — H6123 Impacted cerumen, bilateral: Secondary | ICD-10-CM | POA: Diagnosis not present

## 2017-10-18 DIAGNOSIS — N390 Urinary tract infection, site not specified: Secondary | ICD-10-CM | POA: Diagnosis not present

## 2017-11-09 ENCOUNTER — Ambulatory Visit (INDEPENDENT_AMBULATORY_CARE_PROVIDER_SITE_OTHER): Payer: Medicare Other | Admitting: Podiatry

## 2017-11-09 ENCOUNTER — Encounter: Payer: Self-pay | Admitting: Podiatry

## 2017-11-09 DIAGNOSIS — M79674 Pain in right toe(s): Secondary | ICD-10-CM

## 2017-11-09 DIAGNOSIS — M79675 Pain in left toe(s): Secondary | ICD-10-CM

## 2017-11-09 DIAGNOSIS — B351 Tinea unguium: Secondary | ICD-10-CM

## 2017-11-09 NOTE — Progress Notes (Signed)
Complaint:  Visit Type: Patient returns to my office for continued preventative foot care services. Complaint: Patient states" my nails have grown long and thick and become painful to walk and wear shoes" Patient has been diagnosed with DM with no foot complications. The patient presents for preventative foot care services. No changes to ROS.  Patient says she is experiencing burning through the bottom of her feet.  She says topical application of lidocaine has helped.  Podiatric Exam: Vascular: dorsalis pedis and posterior tibial pulses are palpable bilateral. Capillary return is immediate. Temperature gradient is WNL. Skin turgor WNL  Sensorium: Normal Semmes Weinstein monofilament test. Normal tactile sensation bilaterally. Nail Exam: Pt has thick disfigured discolored nails with subungual debris noted bilateral entire nail hallux through fifth toenails Ulcer Exam: There is no evidence of ulcer or pre-ulcerative changes or infection. Orthopedic Exam: Muscle tone and strength are WNL. No limitations in general ROM. No crepitus or effusions noted. Foot type and digits show no abnormalities. Bony prominences are unremarkable. Skin: No Porokeratosis. No infection or ulcers  Diagnosis:  Onychomycosis, , Pain in right toe, pain in left toes  Treatment & Plan Procedures and Treatment: Consent by patient was obtained for treatment procedures.   Debridement of mycotic and hypertrophic toenails, 1 through 5 bilateral and clearing of subungual debris. No ulceration, no infection noted. Recommend spenco insoles for burning feet. Return Visit-Office Procedure: Patient instructed to return to the office for a follow up visit 3 months for continued evaluation and treatment.    Gardiner Barefoot DPM

## 2017-11-18 ENCOUNTER — Other Ambulatory Visit: Payer: Self-pay | Admitting: Family Medicine

## 2017-11-18 ENCOUNTER — Other Ambulatory Visit (HOSPITAL_COMMUNITY)
Admission: RE | Admit: 2017-11-18 | Discharge: 2017-11-18 | Disposition: A | Discharge status: Home / Self Care | Payer: Medicare Other | Source: Ambulatory Visit | Attending: Family Medicine | Admitting: Family Medicine

## 2017-11-18 DIAGNOSIS — Z01411 Encounter for gynecological examination (general) (routine) with abnormal findings: Secondary | ICD-10-CM | POA: Diagnosis not present

## 2017-11-18 DIAGNOSIS — I10 Essential (primary) hypertension: Secondary | ICD-10-CM | POA: Diagnosis not present

## 2017-11-18 DIAGNOSIS — R030 Elevated blood-pressure reading, without diagnosis of hypertension: Secondary | ICD-10-CM | POA: Diagnosis not present

## 2017-11-18 DIAGNOSIS — Z01419 Encounter for gynecological examination (general) (routine) without abnormal findings: Secondary | ICD-10-CM | POA: Diagnosis not present

## 2017-11-24 LAB — CYTOLOGY - PAP
Diagnosis: NEGATIVE
HPV 16/18/45 GENOTYPING: POSITIVE — AB
HPV: DETECTED — AB

## 2017-12-09 DIAGNOSIS — I10 Essential (primary) hypertension: Secondary | ICD-10-CM | POA: Diagnosis not present

## 2017-12-09 DIAGNOSIS — H6123 Impacted cerumen, bilateral: Secondary | ICD-10-CM | POA: Diagnosis not present

## 2017-12-22 DIAGNOSIS — H547 Unspecified visual loss: Secondary | ICD-10-CM | POA: Diagnosis not present

## 2017-12-22 DIAGNOSIS — Z9181 History of falling: Secondary | ICD-10-CM | POA: Diagnosis not present

## 2017-12-22 DIAGNOSIS — Z7982 Long term (current) use of aspirin: Secondary | ICD-10-CM | POA: Diagnosis not present

## 2017-12-22 DIAGNOSIS — I10 Essential (primary) hypertension: Secondary | ICD-10-CM | POA: Diagnosis not present

## 2017-12-27 DIAGNOSIS — Z9181 History of falling: Secondary | ICD-10-CM | POA: Diagnosis not present

## 2017-12-27 DIAGNOSIS — Z7982 Long term (current) use of aspirin: Secondary | ICD-10-CM | POA: Diagnosis not present

## 2017-12-27 DIAGNOSIS — H547 Unspecified visual loss: Secondary | ICD-10-CM | POA: Diagnosis not present

## 2017-12-27 DIAGNOSIS — I10 Essential (primary) hypertension: Secondary | ICD-10-CM | POA: Diagnosis not present

## 2017-12-28 DIAGNOSIS — H401133 Primary open-angle glaucoma, bilateral, severe stage: Secondary | ICD-10-CM | POA: Diagnosis not present

## 2017-12-28 DIAGNOSIS — Z961 Presence of intraocular lens: Secondary | ICD-10-CM | POA: Diagnosis not present

## 2017-12-29 DIAGNOSIS — Z9181 History of falling: Secondary | ICD-10-CM | POA: Diagnosis not present

## 2017-12-29 DIAGNOSIS — H547 Unspecified visual loss: Secondary | ICD-10-CM | POA: Diagnosis not present

## 2017-12-29 DIAGNOSIS — I10 Essential (primary) hypertension: Secondary | ICD-10-CM | POA: Diagnosis not present

## 2017-12-29 DIAGNOSIS — Z7982 Long term (current) use of aspirin: Secondary | ICD-10-CM | POA: Diagnosis not present

## 2017-12-31 DIAGNOSIS — H547 Unspecified visual loss: Secondary | ICD-10-CM | POA: Diagnosis not present

## 2017-12-31 DIAGNOSIS — I10 Essential (primary) hypertension: Secondary | ICD-10-CM | POA: Diagnosis not present

## 2017-12-31 DIAGNOSIS — Z9181 History of falling: Secondary | ICD-10-CM | POA: Diagnosis not present

## 2017-12-31 DIAGNOSIS — Z7982 Long term (current) use of aspirin: Secondary | ICD-10-CM | POA: Diagnosis not present

## 2018-01-02 DIAGNOSIS — H5213 Myopia, bilateral: Secondary | ICD-10-CM | POA: Diagnosis not present

## 2018-01-04 DIAGNOSIS — I10 Essential (primary) hypertension: Secondary | ICD-10-CM | POA: Diagnosis not present

## 2018-01-04 DIAGNOSIS — Z9181 History of falling: Secondary | ICD-10-CM | POA: Diagnosis not present

## 2018-01-04 DIAGNOSIS — H547 Unspecified visual loss: Secondary | ICD-10-CM | POA: Diagnosis not present

## 2018-01-04 DIAGNOSIS — Z7982 Long term (current) use of aspirin: Secondary | ICD-10-CM | POA: Diagnosis not present

## 2018-01-07 DIAGNOSIS — Z7982 Long term (current) use of aspirin: Secondary | ICD-10-CM | POA: Diagnosis not present

## 2018-01-07 DIAGNOSIS — Z9181 History of falling: Secondary | ICD-10-CM | POA: Diagnosis not present

## 2018-01-07 DIAGNOSIS — H547 Unspecified visual loss: Secondary | ICD-10-CM | POA: Diagnosis not present

## 2018-01-07 DIAGNOSIS — I10 Essential (primary) hypertension: Secondary | ICD-10-CM | POA: Diagnosis not present

## 2018-01-10 DIAGNOSIS — I10 Essential (primary) hypertension: Secondary | ICD-10-CM | POA: Diagnosis not present

## 2018-01-10 DIAGNOSIS — Z7982 Long term (current) use of aspirin: Secondary | ICD-10-CM | POA: Diagnosis not present

## 2018-01-10 DIAGNOSIS — H547 Unspecified visual loss: Secondary | ICD-10-CM | POA: Diagnosis not present

## 2018-01-10 DIAGNOSIS — Z9181 History of falling: Secondary | ICD-10-CM | POA: Diagnosis not present

## 2018-01-20 DIAGNOSIS — G9001 Carotid sinus syncope: Secondary | ICD-10-CM | POA: Diagnosis not present

## 2018-01-20 DIAGNOSIS — E1149 Type 2 diabetes mellitus with other diabetic neurological complication: Secondary | ICD-10-CM | POA: Diagnosis not present

## 2018-01-20 DIAGNOSIS — I10 Essential (primary) hypertension: Secondary | ICD-10-CM | POA: Diagnosis not present

## 2018-02-08 ENCOUNTER — Encounter: Payer: Self-pay | Admitting: Podiatry

## 2018-02-08 ENCOUNTER — Ambulatory Visit (INDEPENDENT_AMBULATORY_CARE_PROVIDER_SITE_OTHER): Payer: Medicare Other | Admitting: Podiatry

## 2018-02-08 DIAGNOSIS — Q828 Other specified congenital malformations of skin: Secondary | ICD-10-CM

## 2018-02-08 DIAGNOSIS — M79675 Pain in left toe(s): Secondary | ICD-10-CM | POA: Diagnosis not present

## 2018-02-08 DIAGNOSIS — B351 Tinea unguium: Secondary | ICD-10-CM | POA: Diagnosis not present

## 2018-02-08 DIAGNOSIS — M79674 Pain in right toe(s): Secondary | ICD-10-CM

## 2018-02-08 NOTE — Progress Notes (Signed)
Complaint:  Visit Type: Patient returns to my office for continued preventative foot care services. Complaint: Patient states" my nails have grown long and thick and become painful to walk and wear shoes" Patient has been diagnosed with DM with no foot complications. The patient presents for preventative foot care services. No changes to ROS.  Patient says she is experiencing burning through the bottom of her feet.  She says she is now taking gabapentin which is helping to sleep at night.  Podiatric Exam: Vascular: dorsalis pedis and posterior tibial pulses are palpable bilateral. Capillary return is immediate. Temperature gradient is WNL. Skin turgor WNL  Sensorium: Normal Semmes Weinstein monofilament test. Normal tactile sensation bilaterally. Nail Exam: Pt has thick disfigured discolored nails with subungual debris noted bilateral entire nail hallux through fifth toenails Ulcer Exam: There is no evidence of ulcer or pre-ulcerative changes or infection. Orthopedic Exam: Muscle tone and strength are WNL. No limitations in general ROM. No crepitus or effusions noted. Foot type and digits show no abnormalities. Bony prominences are unremarkable. Skin:  Porokeratosis sub 1,5  B/L   No infection or ulcers  Diagnosis:  Onychomycosis, , Pain in right toe, pain in left toes  Treatment & Plan Procedures and Treatment: Consent by patient was obtained for treatment procedures.   Debridement of mycotic and hypertrophic toenails, 1 through 5 bilateral and clearing of subungual debris. No ulceration, no infection noted. Recommend spenco insoles for burning feet. Return Visit-Office Procedure: Patient instructed to return to the office for a follow up visit 3 months for continued evaluation and treatment.    Gardiner Barefoot DPM

## 2018-02-24 DIAGNOSIS — I10 Essential (primary) hypertension: Secondary | ICD-10-CM | POA: Diagnosis not present

## 2018-02-24 DIAGNOSIS — G5793 Unspecified mononeuropathy of bilateral lower limbs: Secondary | ICD-10-CM | POA: Diagnosis not present

## 2018-03-27 DIAGNOSIS — J399 Disease of upper respiratory tract, unspecified: Secondary | ICD-10-CM | POA: Diagnosis not present

## 2018-05-10 ENCOUNTER — Ambulatory Visit (INDEPENDENT_AMBULATORY_CARE_PROVIDER_SITE_OTHER): Payer: Medicare Other | Admitting: Podiatry

## 2018-05-10 ENCOUNTER — Encounter: Payer: Self-pay | Admitting: Podiatry

## 2018-05-10 DIAGNOSIS — B351 Tinea unguium: Secondary | ICD-10-CM | POA: Diagnosis not present

## 2018-05-10 DIAGNOSIS — Q828 Other specified congenital malformations of skin: Secondary | ICD-10-CM

## 2018-05-10 DIAGNOSIS — M79674 Pain in right toe(s): Secondary | ICD-10-CM | POA: Diagnosis not present

## 2018-05-10 DIAGNOSIS — M79675 Pain in left toe(s): Secondary | ICD-10-CM | POA: Diagnosis not present

## 2018-05-10 NOTE — Progress Notes (Signed)
Complaint:  Visit Type: Patient returns to my office for continued preventative foot care services. Complaint: Patient states" my nails have grown long and thick and become painful to walk and wear shoes" Patient has been diagnosed with DM with no foot complications. The patient presents for preventative foot care services. No changes to ROS.  Patient says she is experiencing burning through the bottom of her feet.    Podiatric Exam: Vascular: dorsalis pedis and posterior tibial pulses are palpable bilateral. Capillary return is immediate. Temperature gradient is WNL. Skin turgor WNL  Sensorium: Normal Semmes Weinstein monofilament test. Normal tactile sensation bilaterally. Nail Exam: Pt has thick disfigured discolored nails with subungual debris noted bilateral entire nail hallux through fifth toenails Ulcer Exam: There is no evidence of ulcer or pre-ulcerative changes or infection. Orthopedic Exam: Muscle tone and strength are WNL. No limitations in general ROM. No crepitus or effusions noted. Foot type and digits show no abnormalities. Bony prominences are unremarkable. Skin:  Porokeratosis sub 1,5  B/L   No infection or ulcers  Diagnosis:  Onychomycosis, , Pain in right toe, pain in left toes  Debride porokeratosis  Sub 1  B/l  Treatment & Plan Procedures and Treatment: Consent by patient was obtained for treatment procedures.   Debridement of mycotic and hypertrophic toenails, 1 through 5 bilateral and clearing of subungual debris. No ulceration, no infection noted. Recommend spenco insoles for burning feet. Return Visit-Office Procedure: Patient instructed to return to the office for a follow up visit 3 months for continued evaluation and treatment.    Gardiner Barefoot DPM

## 2018-07-21 ENCOUNTER — Ambulatory Visit: Payer: Medicare Other | Admitting: Podiatry

## 2018-07-27 DIAGNOSIS — N399 Disorder of urinary system, unspecified: Secondary | ICD-10-CM | POA: Diagnosis not present

## 2018-07-27 DIAGNOSIS — E1169 Type 2 diabetes mellitus with other specified complication: Secondary | ICD-10-CM | POA: Diagnosis not present

## 2018-07-27 DIAGNOSIS — N390 Urinary tract infection, site not specified: Secondary | ICD-10-CM | POA: Diagnosis not present

## 2018-07-27 DIAGNOSIS — I10 Essential (primary) hypertension: Secondary | ICD-10-CM | POA: Diagnosis not present

## 2018-08-10 DIAGNOSIS — I10 Essential (primary) hypertension: Secondary | ICD-10-CM | POA: Diagnosis not present

## 2018-08-10 DIAGNOSIS — D530 Protein deficiency anemia: Secondary | ICD-10-CM | POA: Diagnosis not present

## 2018-08-10 DIAGNOSIS — N390 Urinary tract infection, site not specified: Secondary | ICD-10-CM | POA: Diagnosis not present

## 2018-08-29 ENCOUNTER — Encounter: Payer: Self-pay | Admitting: Podiatry

## 2018-08-29 ENCOUNTER — Other Ambulatory Visit: Payer: Self-pay

## 2018-08-29 ENCOUNTER — Ambulatory Visit (INDEPENDENT_AMBULATORY_CARE_PROVIDER_SITE_OTHER): Payer: Medicare Other | Admitting: Podiatry

## 2018-08-29 VITALS — Temp 97.5°F

## 2018-08-29 DIAGNOSIS — Q828 Other specified congenital malformations of skin: Secondary | ICD-10-CM | POA: Diagnosis not present

## 2018-08-29 DIAGNOSIS — B351 Tinea unguium: Secondary | ICD-10-CM | POA: Diagnosis not present

## 2018-08-29 DIAGNOSIS — M79675 Pain in left toe(s): Secondary | ICD-10-CM

## 2018-08-29 DIAGNOSIS — M79674 Pain in right toe(s): Secondary | ICD-10-CM

## 2018-08-29 NOTE — Progress Notes (Signed)
Complaint:  Visit Type: Patient returns to my office for continued preventative foot care services. Complaint: Patient states" my nails have grown long and thick and become painful to walk and wear shoes" Patient has been diagnosed with DM with no foot complications. The patient presents for preventative foot care services. No changes to ROS.    Podiatric Exam: Vascular: dorsalis pedis and posterior tibial pulses are palpable bilateral. Capillary return is immediate. Temperature gradient is WNL. Skin turgor WNL  Sensorium: Normal Semmes Weinstein monofilament test. Normal tactile sensation bilaterally. Nail Exam: Pt has thick disfigured discolored nails with subungual debris noted bilateral entire nail hallux through fifth toenails Ulcer Exam: There is no evidence of ulcer or pre-ulcerative changes or infection. Orthopedic Exam: Muscle tone and strength are WNL. No limitations in general ROM. No crepitus or effusions noted. Foot type and digits show no abnormalities. Bony prominences are unremarkable. Skin:  Porokeratosis sub 1,5  B/L   No infection or ulcers  Diagnosis:  Onychomycosis, , Pain in right toe, pain in left toes  Debride porokeratosis  Sub 1  B/l  Treatment & Plan Procedures and Treatment: Consent by patient was obtained for treatment procedures.   Debridement of mycotic and hypertrophic toenails, 1 through 5 bilateral and clearing of subungual debris. No ulceration, no infection noted Return Visit-Office Procedure: Patient instructed to return to the office for a follow up visit 3 months for continued evaluation and treatment.    Gardiner Barefoot DPM

## 2018-10-10 DIAGNOSIS — R69 Illness, unspecified: Secondary | ICD-10-CM | POA: Diagnosis not present

## 2018-10-10 DIAGNOSIS — I10 Essential (primary) hypertension: Secondary | ICD-10-CM | POA: Diagnosis not present

## 2018-10-10 DIAGNOSIS — N399 Disorder of urinary system, unspecified: Secondary | ICD-10-CM | POA: Diagnosis not present

## 2018-10-31 DIAGNOSIS — N898 Other specified noninflammatory disorders of vagina: Secondary | ICD-10-CM | POA: Diagnosis not present

## 2018-11-07 ENCOUNTER — Encounter: Payer: Self-pay | Admitting: Podiatry

## 2018-11-07 ENCOUNTER — Other Ambulatory Visit: Payer: Self-pay

## 2018-11-07 ENCOUNTER — Ambulatory Visit (INDEPENDENT_AMBULATORY_CARE_PROVIDER_SITE_OTHER): Payer: Medicare HMO | Admitting: Podiatry

## 2018-11-07 DIAGNOSIS — B351 Tinea unguium: Secondary | ICD-10-CM

## 2018-11-07 DIAGNOSIS — M79675 Pain in left toe(s): Secondary | ICD-10-CM | POA: Diagnosis not present

## 2018-11-07 DIAGNOSIS — M79674 Pain in right toe(s): Secondary | ICD-10-CM | POA: Diagnosis not present

## 2018-11-07 DIAGNOSIS — Q828 Other specified congenital malformations of skin: Secondary | ICD-10-CM

## 2018-11-07 NOTE — Progress Notes (Signed)
Complaint:  Visit Type: Patient returns to my office for continued preventative foot care services. Complaint: Patient states" my nails have grown long and thick and become painful to walk and wear shoes" Patient has been diagnosed with DM with no foot complications. The patient presents for preventative foot care services. No changes to ROS.    Podiatric Exam: Vascular: dorsalis pedis and posterior tibial pulses are palpable bilateral. Capillary return is immediate. Temperature gradient is WNL. Skin turgor WNL  Sensorium: Normal Semmes Weinstein monofilament test. Normal tactile sensation bilaterally. Nail Exam: Pt has thick disfigured discolored nails with subungual debris noted bilateral entire nail hallux through fifth toenails Ulcer Exam: There is no evidence of ulcer or pre-ulcerative changes or infection. Orthopedic Exam: Muscle tone and strength are WNL. No limitations in general ROM. No crepitus or effusions noted. Foot type and digits show no abnormalities. Bony prominences are unremarkable. Skin:  Porokeratosis sub 1,5  B/L   No infection or ulcers  Diagnosis:  Onychomycosis, , Pain in right toe, pain in left toes  Debride porokeratosis  Sub 1  B/l  Treatment & Plan Procedures and Treatment: Consent by patient was obtained for treatment procedures.   Debridement of mycotic and hypertrophic toenails, 1 through 5 bilateral and clearing of subungual debris. No ulceration, no infection noted Return Visit-Office Procedure: Patient instructed to return to the office for a follow up visit 3 months for continued evaluation and treatment.    Gardiner Barefoot DPM

## 2018-12-07 DIAGNOSIS — H401133 Primary open-angle glaucoma, bilateral, severe stage: Secondary | ICD-10-CM | POA: Diagnosis not present

## 2018-12-07 DIAGNOSIS — Z961 Presence of intraocular lens: Secondary | ICD-10-CM | POA: Diagnosis not present

## 2018-12-07 DIAGNOSIS — H04123 Dry eye syndrome of bilateral lacrimal glands: Secondary | ICD-10-CM | POA: Diagnosis not present

## 2019-01-10 ENCOUNTER — Encounter: Payer: Self-pay | Admitting: Podiatry

## 2019-01-10 ENCOUNTER — Ambulatory Visit (INDEPENDENT_AMBULATORY_CARE_PROVIDER_SITE_OTHER): Payer: Medicare HMO | Admitting: Podiatry

## 2019-01-10 ENCOUNTER — Other Ambulatory Visit: Payer: Self-pay

## 2019-01-10 DIAGNOSIS — M79675 Pain in left toe(s): Secondary | ICD-10-CM | POA: Diagnosis not present

## 2019-01-10 DIAGNOSIS — B351 Tinea unguium: Secondary | ICD-10-CM

## 2019-01-10 DIAGNOSIS — Q828 Other specified congenital malformations of skin: Secondary | ICD-10-CM

## 2019-01-10 DIAGNOSIS — M79674 Pain in right toe(s): Secondary | ICD-10-CM

## 2019-01-10 NOTE — Progress Notes (Signed)
Complaint:  Visit Type: Patient returns to my office for continued preventative foot care services. Complaint: Patient states" my nails have grown long and thick and become painful to walk and wear shoes" Patient has been diagnosed with DM with no foot complications. The patient presents for preventative foot care services. No changes to ROS.    Podiatric Exam: Vascular: dorsalis pedis and posterior tibial pulses are palpable bilateral. Capillary return is immediate. Temperature gradient is WNL. Skin turgor WNL  Sensorium: Normal Semmes Weinstein monofilament test. Normal tactile sensation bilaterally. Nail Exam: Pt has thick disfigured discolored nails with subungual debris noted bilateral entire nail hallux through fifth toenails Ulcer Exam: There is no evidence of ulcer or pre-ulcerative changes or infection. Orthopedic Exam: Muscle tone and strength are WNL. No limitations in general ROM. No crepitus or effusions noted. Foot type and digits show no abnormalities. Bony prominences are unremarkable. Skin:  Porokeratosis sub 1,5  B/L   No infection or ulcers  Diagnosis:  Onychomycosis, , Pain in right toe, pain in left toes  Debride porokeratosis  Sub 1  B/l  Treatment & Plan Procedures and Treatment: Consent by patient was obtained for treatment procedures.   Debridement of mycotic and hypertrophic toenails, 1 through 5 bilateral and clearing of subungual debris. No ulceration, no infection noted Return Visit-Office Procedure: Patient instructed to return to the office for a follow up visit 3 months for continued evaluation and treatment.    Shontell Prosser DPM 

## 2019-01-18 DIAGNOSIS — M130 Polyarthritis, unspecified: Secondary | ICD-10-CM | POA: Diagnosis not present

## 2019-01-18 DIAGNOSIS — I10 Essential (primary) hypertension: Secondary | ICD-10-CM | POA: Diagnosis not present

## 2019-01-18 DIAGNOSIS — Z23 Encounter for immunization: Secondary | ICD-10-CM | POA: Diagnosis not present

## 2019-02-01 ENCOUNTER — Encounter (HOSPITAL_COMMUNITY): Payer: Self-pay

## 2019-02-01 ENCOUNTER — Ambulatory Visit (HOSPITAL_COMMUNITY)
Admission: EM | Admit: 2019-02-01 | Discharge: 2019-02-01 | Disposition: A | Discharge status: Home / Self Care | Payer: Medicare HMO | Attending: Family Medicine | Admitting: Family Medicine

## 2019-02-01 ENCOUNTER — Other Ambulatory Visit: Payer: Self-pay

## 2019-02-01 DIAGNOSIS — T63421A Toxic effect of venom of ants, accidental (unintentional), initial encounter: Secondary | ICD-10-CM | POA: Diagnosis not present

## 2019-02-01 MED ORDER — TRIAMCINOLONE ACETONIDE 0.1 % EX CREA: 1.0000 "application " | TOPICAL_CREAM | Freq: Two times a day (BID) | CUTANEOUS | 0 refills | Status: DC

## 2019-02-01 NOTE — Discharge Instructions (Signed)
Multiple fire ant bites from your yard - need to be exterminated  Use the triamcinolone cream 2-3 times a day until rash clears

## 2019-02-01 NOTE — ED Triage Notes (Signed)
Patient presents to Urgent Care with complaints of multiple bug bites on her right foot since yesterday evening. Patient reports there have been lots of bugs around her apartment, bug bites are itchy.

## 2019-02-01 NOTE — ED Provider Notes (Signed)
[place]    CSN: [id number] Arrival date & time: 02/01/19  1824      History   Chief Complaint Chief Complaint  Patient presents with  . Insect Bite    HPI Alexis Ball is a 63 y.o. female.   HPI  Patient states that she was sitting out on her porch yesterday evening.  She felt stinging on her foot.  She looked down and was covered in insects.  She has multiple bites on her right ankle and foot.  As I look at them they appear to be pustules.  She confirms that these were smaller and ants.  She states that they live in a bush that is near her porch.  I explained to her that these swarm and bite, and need to be exterminated.  Past Medical History:  Diagnosis Date  . Abnormal ECG 03/13/2013   TWI diffuse. Was seen prior to cath in 2007. No CAD.  Marland Kitchen Atypical chest pain 03/13/2013  . Hypertension     Patient Active Problem List   Diagnosis Date Noted  . Chondromalacia patellae, left knee 07/02/2016  . Arthritis of left knee 02/25/2016  . Vaginitis and vulvovaginitis, unspecified 08/08/2013  . Leiomyoma of uterus, unspecified 08/08/2013  . Atypical chest pain 03/13/2013  . Abnormal ECG 03/13/2013  . Obesity, unspecified 03/13/2013  . Hypertension     Past Surgical History:  Procedure Laterality Date  . FOOT SURGERY      OB History    Gravida  3   Para  3   Term  3   Preterm      AB      Living  3     SAB      TAB      Ectopic      Multiple      Live Births  3            Home Medications    Prior to Admission medications   Medication Sig Start Date End Date Taking? Authorizing Provider  aspirin EC 81 MG tablet Take 81 mg by mouth daily.    [provider]  bimatoprost (LUMIGAN) 0.01 % SOLN Place 1 drop into both eyes daily.    [provider]  brimonidine (ALPHAGAN) 0.15 % ophthalmic solution Place 1 drop into both eyes 2 (two) times daily.     [provider]  dorzolamide (TRUSOPT) 2 %  ophthalmic solution INSTILL 1 DROP INTO BOTH EYES TWICE A DAY 12/09/18   [provider]  dorzolamide-timolol (COSOPT) 22.3-6.8 MG/ML ophthalmic solution Place 1 drop into both eyes 2 (two) times daily.  05/14/13   [provider]  etodolac (LODINE) 400 MG tablet Take 400 mg by mouth 2 (two) times daily as needed (pain).  06/23/17   [provider]  ferrous sulfate 325 (65 FE) MG tablet Take 325 mg by mouth daily with breakfast.    [provider]  gabapentin (NEURONTIN) 300 MG capsule TK 1 C PO IN THE MORNING AND 2 CS PO HS 03/25/18   [provider]  hydrochlorothiazide (HYDRODIURIL) 25 MG tablet Take 25 mg by mouth daily.    [provider]  ibuprofen (ADVIL,MOTRIN) 200 MG tablet Take 600-800 mg by mouth every 6 (six) hours as needed for headache (pain).    [provider]  lidocaine (XYLOCAINE) 5 % ointment Apply 1 application topically 2 (two) times daily.    [provider]  montelukast (SINGULAIR) 10 MG tablet  TK 1 T PO QHS 04/27/18   [provider]  Polyvinyl Alcohol-Povidone (REFRESH OP) Place 1 drop into both eyes 3 (three) times daily as needed (dry eyes).     [provider]  potassium chloride SA (K-DUR,KLOR-CON) 20 MEQ tablet Take 20 mEq by mouth daily.    [provider]  timolol (TIMOPTIC) 0.5 % ophthalmic solution  01/01/19   [provider]  triamcinolone cream (KENALOG) 0.1 % Apply 1 application topically 2 (two) times daily. 02/01/19   Raylene Everts, MD    Family History Family History  Problem Relation Age of Onset  . Hypertension Mother     Social History Social History   Tobacco Use  . Smoking status: Never Smoker  . Smokeless tobacco: Never Used  Substance Use Topics  . Alcohol use: No  . Drug use: No     Allergies   Patient has no known allergies.   Review of Systems Review of Systems  Constitutional: Negative for chills and fever.  HENT:  Negative for ear pain and sore throat.   Eyes: Negative for pain and visual disturbance.  Respiratory: Negative for cough and shortness of breath.   Cardiovascular: Negative for chest pain and palpitations.  Gastrointestinal: Negative for abdominal pain and vomiting.  Genitourinary: Negative for dysuria and hematuria.  Musculoskeletal: Negative for arthralgias and back pain.  Skin: Positive for color change and wound. Negative for rash.  Neurological: Negative for seizures and syncope.  All other systems reviewed and are negative.    Physical Exam Triage Vital Signs ED Triage Vitals  Enc Vitals Group     BP 02/01/19 1848 (!) 141/67     Pulse Rate 02/01/19 1848 62     Resp 02/01/19 1848 17     Temp 02/01/19 1848 98.3 F (36.8 C)     Temp Source 02/01/19 1848 Oral     SpO2 02/01/19 1848 100 %     Weight --      Height --      Head Circumference --      Peak Flow --      Pain Score 02/01/19 1846 1     Pain Loc --      Pain Edu? --      Excl. in [place]? --    No data found.  Updated Vital Signs BP (!) 141/67 (BP Location: Right Arm)   Pulse 62   Temp 98.3 F (36.8 C) (Oral)   Resp 17   SpO2 100%     Physical Exam Constitutional:      General: She is not in acute distress.    Appearance: She is well-developed. She is obese.  HENT:     Head: Normocephalic and atraumatic.  Eyes:     Conjunctiva/sclera: Conjunctivae normal.     Pupils: Pupils are equal, round, and reactive to light.  Neck:     Musculoskeletal: Normal range of motion.  Cardiovascular:     Rate and Rhythm: Normal rate.  Pulmonary:     Effort: Pulmonary effort is normal. No respiratory distress.  Abdominal:     General: There is no distension.     Palpations: Abdomen is soft.  Musculoskeletal: Normal range of motion.  Skin:    General: Skin is warm and dry.     Comments: Multiple small pustules are present on the ankle and foot on the right side, mostly posterior.  No soft tissue swelling or  cellulitis  Neurological:     Mental Status:  She is alert.  Psychiatric:        Mood and Affect: Mood normal.        Behavior: Behavior normal.     Comments: Pleasant      UC Treatments / Results  Labs (all labs ordered are listed, but only abnormal results are displayed) Labs Reviewed - No data to display  EKG   Radiology No results found.  Procedures Procedures (including critical care time)  Medications Ordered in UC Medications - No data to display  Initial Impression / Assessment and Plan / UC Course  I have reviewed the triage vital signs and the nursing notes.  Pertinent labs & imaging results that were available during my care of the patient were reviewed by me and considered in my medical decision making (see chart for details).      Final Clinical Impressions(s) / UC Diagnoses   Final diagnoses:  Fire ant bite, accidental or unintentional, initial encounter     Discharge Instructions     Multiple fire ant bites from your yard - need to be exterminated  Use the triamcinolone cream 2-3 times a day until rash clears   ED Prescriptions    Medication Sig Dispense Auth. Provider   triamcinolone cream (KENALOG) 0.1 % Apply 1 application topically 2 (two) times daily. 30 g Raylene Everts, MD     PDMP not reviewed this encounter.   Raylene Everts, MD 02/01/19 [phone]

## 2019-02-15 ENCOUNTER — Other Ambulatory Visit: Payer: Self-pay

## 2019-02-15 ENCOUNTER — Encounter: Payer: Self-pay | Admitting: Surgery

## 2019-02-15 ENCOUNTER — Ambulatory Visit: Payer: Self-pay

## 2019-02-15 ENCOUNTER — Ambulatory Visit (INDEPENDENT_AMBULATORY_CARE_PROVIDER_SITE_OTHER): Payer: Medicare HMO | Admitting: Surgery

## 2019-02-15 DIAGNOSIS — M1712 Unilateral primary osteoarthritis, left knee: Secondary | ICD-10-CM

## 2019-02-15 DIAGNOSIS — M25562 Pain in left knee: Secondary | ICD-10-CM | POA: Diagnosis not present

## 2019-02-15 MED ORDER — METHYLPREDNISOLONE ACETATE 40 MG/ML IJ SUSP
40.0000 mg | INTRAMUSCULAR | Status: AC | PRN
  Administered 2019-02-15: 40 mg via INTRA_ARTICULAR

## 2019-02-15 MED ORDER — LIDOCAINE HCL 1 % IJ SOLN
3.0000 mL | INTRAMUSCULAR | Status: AC | PRN
  Administered 2019-02-15: 3 mL

## 2019-02-15 MED ORDER — BUPIVACAINE HCL 0.25 % IJ SOLN
6.0000 mL | INTRAMUSCULAR | Status: AC | PRN
  Administered 2019-02-15: 14:00:00 6 mL via INTRA_ARTICULAR

## 2019-02-15 NOTE — Progress Notes (Signed)
Office Visit Note   Patient: Alexis Ball           Date of Birth: 1955/11/27           MRN: [id number] Visit Date: 02/15/2019              Requested by: Lucianne Lei, [address] [place],  [place] [address] PCP: Lucianne Lei, MD   Assessment & Plan: Visit Diagnoses:  1. Acute pain of left knee   2. Arthritis of left knee     Plan: In hopes of giving patient some improvement of her left knee pain offered Marcaine/Depo-Medrol injection.  After patient consent left knee was prepped with Betadine and injection was performed.  Follow-up with Dr. Lorin Mercy in 4 weeks for recheck.  Patient understands that ultimately he may come down to needing definitive treatment with total knee replacement.  She will like to exhaust all conservative measures before going that route.  We did discuss the possibility of trying viscosupplementation injections in the future.  Follow-Up Instructions: Return in about 4 weeks (around 03/15/2019) for With Dr. Lorin Mercy recheck left knee.   Orders:  Orders Placed This Encounter  Procedures  . XR KNEE 3 VIEW LEFT   No orders of the defined types were placed in this encounter.     Procedures: Large Joint Inj: L knee on 02/15/2019 2:16 PM Indications: pain Details: 25 G 1.5 in needle, anteromedial approach  Arthrogram: No  Medications: 3 mL lidocaine 1 %; 6 mL bupivacaine 0.25 %; 40 mg methylPREDNISolone acetate 40 MG/ML Consent was given by the patient. Patient was prepped and draped in the usual sterile fashion.       Clinical Data: No additional findings.   Subjective: Chief Complaint  Patient presents with  . Left Knee - Pain    HPI 63 year old black female comes in today with complaints of acute on chronic left knee pain.  Patient has a history of left knee DJD.  States that she had left knee arthroscopy by Dr. Lorin Mercy 2018.  Always has issues with left knee pain but this is been worse over the last several weeks.  States that she had  bumped her knee on an iron rail and then fell onto the knee.  Currently has pain with ambulation, stairs etc.  States that Dr. Lorin Mercy has told her in the past that ultimately it would likely come down her needing total knee replacement. Review of Systems No current cardiac pulmonary GI GU issues  Objective: Vital Signs: There were no vitals taken for this visit.  Physical Exam HENT:     Head: Normocephalic and atraumatic.  Pulmonary:     Effort: No respiratory distress.  Musculoskeletal:     Comments: Gait antalgic.  Left knee positive crepitus.  Good range of motion.  Some swelling without large effusion.  Joint line tender.  Ligaments stable.  Calf nontender.  Skin:    General: Skin is warm and dry.  Neurological:     General: No focal deficit present.     Mental Status: She is oriented to person, place, and time.  Psychiatric:        Mood and Affect: Mood normal.        Behavior: Behavior normal.     Ortho Exam  Specialty Comments:  No specialty comments available.  Imaging: No results found.   PMFS History: Patient Active Problem List   Diagnosis Date Noted  . Chondromalacia patellae, left knee 07/02/2016  .  Arthritis of left knee 02/25/2016  . Vaginitis and vulvovaginitis, unspecified 08/08/2013  . Leiomyoma of uterus, unspecified 08/08/2013  . Atypical chest pain 03/13/2013  . Abnormal ECG 03/13/2013  . Obesity, unspecified 03/13/2013  . Hypertension    Past Medical History:  Diagnosis Date  . Abnormal ECG 03/13/2013   TWI diffuse. Was seen prior to cath in 2007. No CAD.  Marland Kitchen Atypical chest pain 03/13/2013  . Hypertension     Family History  Problem Relation Age of Onset  . Hypertension Mother     Past Surgical History:  Procedure Laterality Date  . FOOT SURGERY     Social History   Occupational History  . Not on file  Tobacco Use  . Smoking status: Never Smoker  . Smokeless tobacco: Never Used  Substance and Sexual Activity  . Alcohol use: No  .  Drug use: No  . Sexual activity: Yes    Birth control/protection: Post-menopausal

## 2019-03-02 ENCOUNTER — Encounter (HOSPITAL_COMMUNITY): Payer: Self-pay

## 2019-03-02 ENCOUNTER — Ambulatory Visit (HOSPITAL_COMMUNITY)
Admission: EM | Admit: 2019-03-02 | Discharge: 2019-03-02 | Disposition: A | Discharge status: Home / Self Care | Payer: Medicare HMO | Attending: Urgent Care | Admitting: Urgent Care

## 2019-03-02 ENCOUNTER — Other Ambulatory Visit: Payer: Self-pay

## 2019-03-02 DIAGNOSIS — R350 Frequency of micturition: Secondary | ICD-10-CM | POA: Diagnosis not present

## 2019-03-02 DIAGNOSIS — R58 Hemorrhage, not elsewhere classified: Secondary | ICD-10-CM | POA: Diagnosis not present

## 2019-03-02 DIAGNOSIS — K5900 Constipation, unspecified: Secondary | ICD-10-CM | POA: Diagnosis not present

## 2019-03-02 DIAGNOSIS — R1030 Lower abdominal pain, unspecified: Secondary | ICD-10-CM

## 2019-03-02 DIAGNOSIS — R300 Dysuria: Secondary | ICD-10-CM | POA: Diagnosis not present

## 2019-03-02 LAB — POCT URINALYSIS DIP (DEVICE)
Bilirubin Urine: NEGATIVE
Glucose, UA: NEGATIVE mg/dL
Hgb urine dipstick: NEGATIVE
Ketones, ur: NEGATIVE mg/dL
Leukocytes,Ua: NEGATIVE
Nitrite: NEGATIVE
Protein, ur: NEGATIVE mg/dL
Specific Gravity, Urine: 1.025 (ref 1.005–1.030)
Urobilinogen, UA: 2 mg/dL — ABNORMAL HIGH (ref 0.0–1.0)
pH: 7 (ref 5.0–8.0)

## 2019-03-02 MED ORDER — POLYETHYLENE GLYCOL 3350 17 G PO PACK: 17.0000 g | PACK | Freq: Every day | ORAL | 0 refills | Status: DC | PRN

## 2019-03-02 NOTE — ED Provider Notes (Signed)
Alexis Ball   MRN: [id number] DOB: 1955-10-12  Subjective:   Alexis Ball is a 63 y.o. female presenting for 2 complaints: Right arm bruising and lower belly pain.  Bruising - reports 2-3 day history of bruising after donating plasma.  Patient states that there is mild pain but the discoloration bothers patient and wants to make sure she got checked.  Denies drainage of pus or bleeding, redness, warmth.  Belly pain - reports several day history of lower abdominal pain with mild dysuria, urinary frequency.  Patient also states that she does not defecate every day.  She has been eating a lot of potatoes, fried or mashed and also eats very little fiber.  No current facility-administered medications for this encounter.   Current Outpatient Medications:  .  aspirin EC 81 MG tablet, Take 81 mg by mouth daily., Disp: , Rfl:  .  bimatoprost (LUMIGAN) 0.01 % SOLN, Place 1 drop into both eyes daily., Disp: , Rfl:  .  brimonidine (ALPHAGAN) 0.15 % ophthalmic solution, Place 1 drop into both eyes 2 (two) times daily. , Disp: , Rfl:  .  dorzolamide (TRUSOPT) 2 % ophthalmic solution, INSTILL 1 DROP INTO BOTH EYES TWICE A DAY, Disp: , Rfl:  .  dorzolamide-timolol (COSOPT) 22.3-6.8 MG/ML ophthalmic solution, Place 1 drop into both eyes 2 (two) times daily. , Disp: , Rfl:  .  etodolac (LODINE) 400 MG tablet, Take 400 mg by mouth 2 (two) times daily as needed (pain). , Disp: , Rfl: 0 .  ferrous sulfate 325 (65 FE) MG tablet, Take 325 mg by mouth daily with breakfast., Disp: , Rfl:  .  gabapentin (NEURONTIN) 300 MG capsule, TK 1 C PO IN THE MORNING AND 2 CS PO HS, Disp: , Rfl:  .  hydrochlorothiazide (HYDRODIURIL) 25 MG tablet, Take 25 mg by mouth daily., Disp: , Rfl:  .  ibuprofen (ADVIL,MOTRIN) 200 MG tablet, Take 600-800 mg by mouth every 6 (six) hours as needed for headache (pain)., Disp: , Rfl:  .  lidocaine (XYLOCAINE) 5 % ointment, Apply 1 application topically 2 (two) times daily.,  Disp: , Rfl:  .  montelukast (SINGULAIR) 10 MG tablet, TK 1 T PO QHS, Disp: , Rfl:  .  Polyvinyl Alcohol-Povidone (REFRESH OP), Place 1 drop into both eyes 3 (three) times daily as needed (dry eyes). , Disp: , Rfl:  .  potassium chloride SA (K-DUR,KLOR-CON) 20 MEQ tablet, Take 20 mEq by mouth daily., Disp: , Rfl:  .  timolol (TIMOPTIC) 0.5 % ophthalmic solution, , Disp: , Rfl:  .  triamcinolone cream (KENALOG) 0.1 %, Apply 1 application topically 2 (two) times daily., Disp: 30 g, Rfl: 0   No Known Allergies  Past Medical History:  Diagnosis Date  . Abnormal ECG 03/13/2013   TWI diffuse. Was seen prior to cath in 2007. No CAD.  Marland Kitchen Atypical chest pain 03/13/2013  . Hypertension      Past Surgical History:  Procedure Laterality Date  . FOOT SURGERY      Family History  Problem Relation Age of Onset  . Hypertension Mother     Social History   Tobacco Use  . Smoking status: Never Smoker  . Smokeless tobacco: Never Used  Substance Use Topics  . Alcohol use: No  . Drug use: No    Review of Systems  Constitutional: Negative for fever and malaise/fatigue.  HENT: Negative for congestion, ear pain, sinus pain and sore throat.   Eyes: Negative for discharge and redness.  Respiratory: Negative for cough, hemoptysis, shortness of breath and wheezing.   Cardiovascular: Negative for chest pain.  Gastrointestinal: Positive for abdominal pain. Negative for diarrhea, nausea and vomiting.  Genitourinary: Positive for dysuria, frequency and urgency. Negative for flank pain and hematuria.  Musculoskeletal: Negative for myalgias.  Skin: Negative for rash.  Neurological: Negative for dizziness, weakness and headaches.  Psychiatric/Behavioral: Negative for depression and substance abuse.     Objective:   Vitals: BP 120/68 (BP Location: Left Arm)   Pulse 60   Temp 98 F (36.7 C) (Oral)   Resp 17   SpO2 100%   Physical Exam Constitutional:      General: She is not in acute distress.     Appearance: Normal appearance. She is well-developed and normal weight. She is not ill-appearing, toxic-appearing or diaphoretic.  HENT:     Head: Normocephalic and atraumatic.     Right Ear: External ear normal.     Left Ear: External ear normal.     Nose: Nose normal.     Mouth/Throat:     Mouth: Mucous membranes are moist.     Pharynx: Oropharynx is clear.  Eyes:     General: No scleral icterus.    Extraocular Movements: Extraocular movements intact.     Pupils: Pupils are equal, round, and reactive to light.  Cardiovascular:     Rate and Rhythm: Normal rate and regular rhythm.     Pulses: Normal pulses.     Heart sounds: Normal heart sounds. No murmur. No friction rub. No gallop.   Pulmonary:     Effort: Pulmonary effort is normal. No respiratory distress.     Breath sounds: Normal breath sounds. No stridor. No wheezing, rhonchi or rales.  Abdominal:     General: Bowel sounds are normal. There is no distension.     Palpations: Abdomen is soft. There is no mass.     Tenderness: There is no abdominal tenderness. There is no right CVA tenderness, left CVA tenderness, guarding or rebound.  Musculoskeletal:     Right shoulder: She exhibits tenderness. She exhibits normal range of motion, no bony tenderness, no swelling, no effusion, no crepitus, no deformity, no laceration and normal strength.     Right elbow: She exhibits normal range of motion, no swelling, no effusion, no deformity and no laceration. No tenderness found.       Arms:  Skin:    General: Skin is warm and dry.     Coloration: Skin is not pale.     Findings: No rash.  Neurological:     General: No focal deficit present.     Mental Status: She is alert and oriented to person, place, and time.  Psychiatric:        Mood and Affect: Mood normal.        Behavior: Behavior normal.        Thought Content: Thought content normal.        Judgment: Judgment normal.     Results for orders placed or performed during  the hospital encounter of 03/02/19 (from the past 24 hour(s))  POCT urinalysis dip (device)     Status: Abnormal   Collection Time: 03/02/19 12:43 PM  Result Value Ref Range   Glucose, UA NEGATIVE NEGATIVE mg/dL   Bilirubin Urine NEGATIVE NEGATIVE   Ketones, ur NEGATIVE NEGATIVE mg/dL   Specific Gravity, Urine 1.025 1.005 - 1.030   Hgb urine dipstick NEGATIVE NEGATIVE   pH 7.0 5.0 - 8.0   Protein,  ur NEGATIVE NEGATIVE mg/dL   Urobilinogen, UA 2.0 (H) 0.0 - 1.0 mg/dL   Nitrite NEGATIVE NEGATIVE   Leukocytes,Ua NEGATIVE NEGATIVE    Assessment and Plan :   1. Ecchymosis   2. Lower abdominal pain   3. Dysuria   4. Urinary frequency   5. Constipation, unspecified constipation type     Recommended conservative management with ibuprofen for the soreness about her ecchymosis.  Suspect this is related to her plasma donation.  Counseled on need for significant dietary modifications.  Will use MiraLAX to help have a bowel movement.  Patient states that usually stool softeners do not help her.  Therefore emphasized need for more increased fiber intake. Counseled patient on potential for adverse effects with medications prescribed/recommended today, ER and return-to-clinic precautions discussed, patient verbalized understanding.    Jaynee Eagles, PA-C 03/02/19 1308

## 2019-03-02 NOTE — ED Triage Notes (Signed)
Pt presents with bruising on right arm; pt states she donates plasma every few days.  Pt also presents with generalized abdominal pain from unknown source X 3 weeks.

## 2019-03-02 NOTE — Discharge Instructions (Signed)
Make sure that you hydrate much better with at least 2 L of water a day (that 64 ounces).  For elevated blood pressure, make sure you are monitoring salt in your diet.  Do not eat restaurant foods and limit processed foods at home.  Processed foods include things like frozen meals preseason meats and dinners.  Make sure your pain attention to sodium labels on foods you by at the grocery store.  For seasoning you can use a brand called Mrs[employer] Dash which includes a lot of salt free seasonings.  Salads - kale, spinach, cabbage, spring mix; use seeds like pumpkin seeds or sunflower seeds, almonds; you can also use 1-2 hard boiled eggs in your salads Fruits - avocadoes, berries (blueberries, raspberries, blackberries), apples, oranges, pomegranate, grapefruit Vegetables - aspargus, cauliflower, broccoli, green beans, brussel spouts, bell peppers; stay away from starchy vegetables like potatoes, carrots, peas  Regarding meat it is better to eat lean meats and limit your red meat consumption including pork.  Wild caught fish, chicken breast are good options.  Do not eat any foods on this list that you are allergic to.

## 2019-03-03 LAB — URINE CULTURE: Culture: <10000 — AB

## 2019-03-16 ENCOUNTER — Encounter: Payer: Self-pay | Admitting: Orthopaedic Surgery

## 2019-03-16 ENCOUNTER — Ambulatory Visit (INDEPENDENT_AMBULATORY_CARE_PROVIDER_SITE_OTHER): Payer: Medicare HMO | Admitting: Orthopaedic Surgery

## 2019-03-16 ENCOUNTER — Other Ambulatory Visit: Payer: Self-pay

## 2019-03-16 VITALS — Ht 63.0 in | Wt 181.0 lb

## 2019-03-16 DIAGNOSIS — M1712 Unilateral primary osteoarthritis, left knee: Secondary | ICD-10-CM | POA: Diagnosis not present

## 2019-03-16 MED ORDER — MELOXICAM 7.5 MG PO TABS: 7.5000 mg | ORAL_TABLET | Freq: Every day | ORAL | 1 refills | Status: DC

## 2019-03-16 NOTE — Progress Notes (Signed)
Office Visit Note   Patient: Alexis Ball           Date of Birth: 07/29/1955           MRN: [id number] Visit Date: 03/16/2019              Requested by: Lucianne Lei, [address] [place],  [place] [address] PCP: Lucianne Lei, MD   Assessment & Plan: Visit Diagnoses:  1. Arthritis of left knee     Plan: We reviewed the x-rays with patient and discussed recommendations for strengthening and exercises with her not likely to bother her knee as much such as riding exercise bike.  Discussed anti-inflammatories occasional Tylenol.  She has been using Aleve at therapeutic dosages.  She states at times she has difficulty walking but the injection certainly has helped.  We will check her back again in 6 weeks.  Follow-Up Instructions: Return in about 6 weeks (around 04/27/2019).   Orders:  No orders of the defined types were placed in this encounter.  Meds ordered this encounter  Medications  . meloxicam (MOBIC) 7.5 MG tablet    Sig: Take 1 tablet (7.5 mg total) by mouth daily.    Dispense:  60 tablet    Refill:  1      Procedures: No procedures performed   Clinical Data: No additional findings.   Subjective: Chief Complaint  Patient presents with  . Left Knee - Pain, Follow-up    HPI 63 year old female returns for follow-up for knee pain.  Injection 02/15/2019 has helped.  She has been doing her exercises 20 to work on knee range of motion quad strengthening.  She tries to walk on the sidewalk she is used lidocaine rub ibuprofen.  She has noted less limp since the injection.  X-rays demonstrated more medial and lateral joint line narrowing with medial osteophytes 50% joint space narrowing of both knees.  Review of Systems 14 point update unchanged from 02/15/2019 office visit.   Objective: Vital Signs: Ht 5\' 3"  (1.6 m)   Wt 181 lb (82.1 kg)   BMI 32.06 kg/m   Physical Exam Constitutional:      Appearance: She is well-developed.  HENT:     Head:  Normocephalic.     Right Ear: External ear normal.     Left Ear: External ear normal.  Eyes:     Pupils: Pupils are equal, round, and reactive to light.  Neck:     Thyroid: No thyromegaly.     Trachea: No tracheal deviation.  Cardiovascular:     Rate and Rhythm: Normal rate.  Pulmonary:     Effort: Pulmonary effort is normal.  Abdominal:     Palpations: Abdomen is soft.  Skin:    General: Skin is warm and dry.  Neurological:     Mental Status: She is alert and oriented to person, place, and time.  Psychiatric:        Behavior: Behavior normal.     Ortho Exam patient has mild crepitus knee range of motion.  Negative logroll the hips.  Reflexes are 2+ distal pulses are palpable.  Specialty Comments:  No specialty comments available.  Imaging: No results found.   PMFS History: Patient Active Problem List   Diagnosis Date Noted  . Chondromalacia patellae, left knee 07/02/2016  . Arthritis of left knee 02/25/2016  . Vaginitis and vulvovaginitis, unspecified 08/08/2013  . Leiomyoma of uterus, unspecified 08/08/2013  . Atypical chest pain 03/13/2013  . Abnormal  ECG 03/13/2013  . Obesity, unspecified 03/13/2013  . Hypertension    Past Medical History:  Diagnosis Date  . Abnormal ECG 03/13/2013   TWI diffuse. Was seen prior to cath in 2007. No CAD.  Marland Kitchen Atypical chest pain 03/13/2013  . Hypertension     Family History  Problem Relation Age of Onset  . Hypertension Mother     Past Surgical History:  Procedure Laterality Date  . FOOT SURGERY     Social History   Occupational History  . Not on file  Tobacco Use  . Smoking status: Never Smoker  . Smokeless tobacco: Never Used  Substance and Sexual Activity  . Alcohol use: No  . Drug use: No  . Sexual activity: Yes    Birth control/protection: Post-menopausal

## 2019-03-19 DIAGNOSIS — M130 Polyarthritis, unspecified: Secondary | ICD-10-CM | POA: Diagnosis not present

## 2019-03-19 DIAGNOSIS — I10 Essential (primary) hypertension: Secondary | ICD-10-CM | POA: Diagnosis not present

## 2019-03-28 ENCOUNTER — Ambulatory Visit (INDEPENDENT_AMBULATORY_CARE_PROVIDER_SITE_OTHER): Payer: Medicare HMO | Admitting: Podiatry

## 2019-03-28 ENCOUNTER — Encounter: Payer: Self-pay | Admitting: Podiatry

## 2019-03-28 ENCOUNTER — Other Ambulatory Visit: Payer: Self-pay

## 2019-03-28 DIAGNOSIS — M79675 Pain in left toe(s): Secondary | ICD-10-CM

## 2019-03-28 DIAGNOSIS — M79674 Pain in right toe(s): Secondary | ICD-10-CM | POA: Diagnosis not present

## 2019-03-28 DIAGNOSIS — Q828 Other specified congenital malformations of skin: Secondary | ICD-10-CM

## 2019-03-28 DIAGNOSIS — B351 Tinea unguium: Secondary | ICD-10-CM | POA: Diagnosis not present

## 2019-03-28 NOTE — Progress Notes (Signed)
Complaint:  Visit Type: Patient returns to my office for continued preventative foot care services. Complaint: Patient states" my nails have grown long and thick and become painful to walk and wear shoes" Patient has been diagnosed with DM with no foot complications. The patient presents for preventative foot care services. No changes to ROS.    Podiatric Exam: Vascular: dorsalis pedis and posterior tibial pulses are palpable bilateral. Capillary return is immediate. Temperature gradient is WNL. Skin turgor WNL  Sensorium: Normal Semmes Weinstein monofilament test. Normal tactile sensation bilaterally. Nail Exam: Pt has thick disfigured discolored nails with subungual debris noted bilateral entire nail hallux through fifth toenails Ulcer Exam: There is no evidence of ulcer or pre-ulcerative changes or infection. Orthopedic Exam: Muscle tone and strength are WNL. No limitations in general ROM. No crepitus or effusions noted. Foot type and digits show no abnormalities. Bony prominences are unremarkable. Skin:  Porokeratosis sub 1,5  B/L   No infection or ulcers  Diagnosis:  Onychomycosis, , Pain in right toe, pain in left toes  Debride porokeratosis  Sub 1  B/l  Treatment & Plan Procedures and Treatment: Consent by patient was obtained for treatment procedures.   Debridement of mycotic and hypertrophic toenails, 1 through 5 bilateral and clearing of subungual debris. No ulceration, no infection noted Return Visit-Office Procedure: Patient instructed to return to the office for a follow up visit 3 months for continued evaluation and treatment.    Gardiner Barefoot DPM

## 2019-04-05 DIAGNOSIS — I10 Essential (primary) hypertension: Secondary | ICD-10-CM | POA: Diagnosis not present

## 2019-04-05 DIAGNOSIS — G8929 Other chronic pain: Secondary | ICD-10-CM | POA: Diagnosis not present

## 2019-04-05 DIAGNOSIS — E669 Obesity, unspecified: Secondary | ICD-10-CM | POA: Diagnosis not present

## 2019-04-05 DIAGNOSIS — H04129 Dry eye syndrome of unspecified lacrimal gland: Secondary | ICD-10-CM | POA: Diagnosis not present

## 2019-04-05 DIAGNOSIS — I739 Peripheral vascular disease, unspecified: Secondary | ICD-10-CM | POA: Diagnosis not present

## 2019-04-05 DIAGNOSIS — Z6834 Body mass index (BMI) 34.0-34.9, adult: Secondary | ICD-10-CM | POA: Diagnosis not present

## 2019-04-05 DIAGNOSIS — Z7409 Other reduced mobility: Secondary | ICD-10-CM | POA: Diagnosis not present

## 2019-04-05 DIAGNOSIS — G629 Polyneuropathy, unspecified: Secondary | ICD-10-CM | POA: Diagnosis not present

## 2019-04-05 DIAGNOSIS — M2550 Pain in unspecified joint: Secondary | ICD-10-CM | POA: Diagnosis not present

## 2019-04-05 DIAGNOSIS — H409 Unspecified glaucoma: Secondary | ICD-10-CM | POA: Diagnosis not present

## 2019-04-19 ENCOUNTER — Other Ambulatory Visit: Payer: Self-pay

## 2019-04-19 ENCOUNTER — Ambulatory Visit (INDEPENDENT_AMBULATORY_CARE_PROVIDER_SITE_OTHER): Payer: Medicare HMO | Admitting: Podiatry

## 2019-04-19 ENCOUNTER — Other Ambulatory Visit: Payer: Self-pay | Admitting: Podiatry

## 2019-04-19 ENCOUNTER — Ambulatory Visit (INDEPENDENT_AMBULATORY_CARE_PROVIDER_SITE_OTHER): Payer: Medicare HMO

## 2019-04-19 DIAGNOSIS — M7751 Other enthesopathy of right foot: Secondary | ICD-10-CM

## 2019-04-19 DIAGNOSIS — M7752 Other enthesopathy of left foot: Secondary | ICD-10-CM | POA: Diagnosis not present

## 2019-04-19 DIAGNOSIS — M2041 Other hammer toe(s) (acquired), right foot: Secondary | ICD-10-CM

## 2019-04-19 DIAGNOSIS — M778 Other enthesopathies, not elsewhere classified: Secondary | ICD-10-CM

## 2019-04-19 DIAGNOSIS — Q828 Other specified congenital malformations of skin: Secondary | ICD-10-CM

## 2019-04-19 NOTE — Progress Notes (Signed)
She presents today with chief complaint of pain to the left foot and particular around the hammertoes states that she has a burning and throbbing sensation.  Objective: Vital signs are stable she is alert and oriented x3.  Pulses are palpable.  Reactive hyper keratomas plantar aspect of the first and fifth bilaterally hammertoe deformity 2-3 for left foot.  Assessment: Hammertoe deformities 2 through 4 left porokeratosis bilaterally.  Plan: Debridement of all reactive hyperkeratotic tissue I injected 4 injections to the interdigital spaces 5 mg of Kenalog and local anesthetic.  Tolerated procedure well without complications.

## 2019-04-27 ENCOUNTER — Other Ambulatory Visit: Payer: Self-pay

## 2019-04-27 ENCOUNTER — Ambulatory Visit (INDEPENDENT_AMBULATORY_CARE_PROVIDER_SITE_OTHER): Payer: Medicare HMO | Admitting: Orthopaedic Surgery

## 2019-04-27 ENCOUNTER — Encounter: Payer: Self-pay | Admitting: Orthopaedic Surgery

## 2019-04-27 ENCOUNTER — Ambulatory Visit: Payer: Self-pay

## 2019-04-27 VITALS — BP 132/83 | HR 80

## 2019-04-27 DIAGNOSIS — M25562 Pain in left knee: Secondary | ICD-10-CM | POA: Diagnosis not present

## 2019-04-27 DIAGNOSIS — M1712 Unilateral primary osteoarthritis, left knee: Secondary | ICD-10-CM | POA: Insufficient documentation

## 2019-04-27 NOTE — Progress Notes (Signed)
Office Visit Note   Patient: Alexis Ball           Date of Birth: 03-22-1956           MRN: [id number] Visit Date: 04/27/2019              Requested by: Lucianne Lei, [address] [place],  [place] [address] PCP: Lucianne Lei, MD   Assessment & Plan: Visit Diagnoses:  1. Acute pain of left knee   2. Unilateral primary osteoarthritis, left knee     Plan: Left knee injection performed with significant improvement in ambulation.  We discussed the degenerative changes present in her knee.  Follow-Up Instructions: Return in about 2 months (around 06/25/2019).   Orders:  Orders Placed This Encounter  Procedures  . Large Joint Inj: L knee  . XR KNEE 3 VIEW LEFT   No orders of the defined types were placed in this encounter.     Procedures: Large Joint Inj: L knee on 04/27/2019 3:29 PM Indications: joint swelling and pain Details: 22 G 1.5 in needle, anterolateral approach  Arthrogram: No  Medications: 0.5 mL lidocaine 1 %; 3 mL bupivacaine 0.5 %; 40 mg methylPREDNISolone acetate 40 MG/ML Outcome: tolerated well, no immediate complications Procedure, treatment alternatives, risks and benefits explained, specific risks discussed. Consent was given by the patient. Immediately prior to procedure a time out was called to verify the correct patient, procedure, equipment, support staff and site/side marked as required. Patient was prepped and draped in the usual sterile fashion.       Clinical Data: No additional findings.   Subjective: Chief Complaint  Patient presents with  . Left Knee - Follow-up    HPI 64 year old female seen with painful left knee with swelling.  She is used ice, Mobic, Motrin without relief.  She is having difficulty ambulating.  She denies fever chills no recent falls.  Previous injection in November 2020 gave her good relief.  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  Previous x-rays show osteoarthritis 50% joint space narrowing and  marginal osteophytes medially and laterally.  Review of Systems 14 point systems unchanged from 02/15/2019 other than that as mentioned in HPI.   Objective: Vital Signs: BP 132/83   Pulse 80   5'2" Hgt,     Weight 192 lbs. BMI 35.11  Physical Exam Constitutional:      Appearance: She is well-developed.  HENT:     Head: Normocephalic.     Right Ear: External ear normal.     Left Ear: External ear normal.  Eyes:     Pupils: Pupils are equal, round, and reactive to light.  Neck:     Thyroid: No thyromegaly.     Trachea: No tracheal deviation.  Cardiovascular:     Rate and Rhythm: Normal rate.  Pulmonary:     Effort: Pulmonary effort is normal.  Abdominal:     Palpations: Abdomen is soft.  Skin:    General: Skin is warm and dry.  Neurological:     Mental Status: She is alert and oriented to person, place, and time.  Psychiatric:        Behavior: Behavior normal.     Ortho Exam patient has knee synovitis crepitus with knee range of motion collateral ligaments are stable reflexes are 2 logroll of the hips.  Negative Homans' sign no venous engorgement no venous stasis changes no plantar foot lesions.  Specialty Comments:  No specialty comments available.  Imaging: No  results found.   PMFS History: Patient Active Problem List   Diagnosis Date Noted  . Unilateral primary osteoarthritis, left knee 04/27/2019  . Chondromalacia patellae, left knee 07/02/2016  . Arthritis of left knee 02/25/2016  . Vaginitis and vulvovaginitis, unspecified 08/08/2013  . Leiomyoma of uterus, unspecified 08/08/2013  . Atypical chest pain 03/13/2013  . Abnormal ECG 03/13/2013  . Obesity, unspecified 03/13/2013  . Hypertension    Past Medical History:  Diagnosis Date  . Abnormal ECG 03/13/2013   TWI diffuse. Was seen prior to cath in 2007. No CAD.  Marland Kitchen Atypical chest pain 03/13/2013  . Hypertension     Family History  Problem Relation Age of Onset  . Hypertension Mother     Past  Surgical History:  Procedure Laterality Date  . FOOT SURGERY     Social History   Occupational History  . Not on file  Tobacco Use  . Smoking status: Never Smoker  . Smokeless tobacco: Never Used  Substance and Sexual Activity  . Alcohol use: No  . Drug use: No  . Sexual activity: Yes    Birth control/protection: Post-menopausal

## 2019-05-01 MED ORDER — BUPIVACAINE HCL 0.5 % IJ SOLN
3.0000 mL | INTRAMUSCULAR | Status: AC | PRN
  Administered 2019-04-27: 3 mL via INTRA_ARTICULAR

## 2019-05-01 MED ORDER — LIDOCAINE HCL 1 % IJ SOLN
0.5000 mL | INTRAMUSCULAR | Status: AC | PRN
  Administered 2019-04-27: .5 mL

## 2019-05-01 MED ORDER — METHYLPREDNISOLONE ACETATE 40 MG/ML IJ SUSP
40.0000 mg | INTRAMUSCULAR | Status: AC | PRN
  Administered 2019-04-27: 40 mg via INTRA_ARTICULAR

## 2019-05-22 ENCOUNTER — Other Ambulatory Visit: Payer: Self-pay

## 2019-05-22 ENCOUNTER — Encounter: Payer: Self-pay | Admitting: Podiatry

## 2019-05-22 ENCOUNTER — Ambulatory Visit (INDEPENDENT_AMBULATORY_CARE_PROVIDER_SITE_OTHER): Payer: Medicare HMO

## 2019-05-22 ENCOUNTER — Ambulatory Visit (INDEPENDENT_AMBULATORY_CARE_PROVIDER_SITE_OTHER): Payer: Medicare HMO | Admitting: Podiatry

## 2019-05-22 DIAGNOSIS — S9032XA Contusion of left foot, initial encounter: Secondary | ICD-10-CM | POA: Diagnosis not present

## 2019-05-22 NOTE — Progress Notes (Signed)
She presents today chief complaint of pain to the dorsal aspect of her left foot.  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  Objective: Vital signs are stable alert and oriented x3.  Pulses are palpable.  Dorsal aspect of the left foot is swollen has considerable ecchymotic retention around the bases of the second third and fourth toes.  She has considerable bruising of approximately a week or 2 weeks around the dorsal aspect of the foot.  Radiographs taken today demonstrate what appears to be fractures of the bases of the second and third digits.  No comminution.  Can questioning a fracture to the base of the second metatarsal.  Assessment: Contusion foot cannot rule out fractures second and third toes and second metatarsal base.  Plan: We will place her in a cam boot and she is going to follow-up with Dr. Elisha Ponder for more of a routine nail debridement.

## 2019-06-18 DIAGNOSIS — E1169 Type 2 diabetes mellitus with other specified complication: Secondary | ICD-10-CM | POA: Diagnosis not present

## 2019-06-18 DIAGNOSIS — I10 Essential (primary) hypertension: Secondary | ICD-10-CM | POA: Diagnosis not present

## 2019-06-18 DIAGNOSIS — R69 Illness, unspecified: Secondary | ICD-10-CM | POA: Diagnosis not present

## 2019-06-21 DIAGNOSIS — H401133 Primary open-angle glaucoma, bilateral, severe stage: Secondary | ICD-10-CM | POA: Diagnosis not present

## 2019-06-26 ENCOUNTER — Other Ambulatory Visit: Payer: Self-pay

## 2019-06-26 ENCOUNTER — Ambulatory Visit (INDEPENDENT_AMBULATORY_CARE_PROVIDER_SITE_OTHER): Payer: Medicare HMO | Admitting: Orthopaedic Surgery

## 2019-06-26 DIAGNOSIS — M1712 Unilateral primary osteoarthritis, left knee: Secondary | ICD-10-CM

## 2019-06-26 NOTE — Progress Notes (Signed)
Office Visit Note   Patient: Alexis Ball           Date of Birth: 1955-08-15           MRN: [id number] Visit Date: 06/26/2019              Requested by: Lucianne Lei, MD [address] [place],  [place] [address] PCP: Lucianne Lei, MD   Assessment & Plan: Visit Diagnoses:  1. Unilateral primary osteoarthritis, left knee     Plan: Patient got good relief from the intra-articular injection in her left knee in January.  She like to return in a month.  She is overweight with a BMI is less than 40 we discussed the importance of not gaining weight.  We discussed quadriceps isometric exercises and straight leg raising.  Recheck 1 month.  Follow-Up Instructions: Return in about 1 month (around 07/27/2019).   Orders:  No orders of the defined types were placed in this encounter.  No orders of the defined types were placed in this encounter.     Procedures: No procedures performed   Clinical Data: No additional findings.   Subjective: Chief Complaint  Patient presents with  . Left Knee - Follow-up    HPI 64 year old female with left knee osteoarthritis returns and states the injection in January is giving her good relief of her knee pain.  She is having some pain in her right lateral hip and some mild discomfort in her right knee.  Previous x-rays left knee show tricompartmental arthritis.Patient denies groin pain.  Review of Systems 14 point system unchanged from 02/15/2019 other than mentioned in HPI.   Objective: Vital Signs: There were no vitals taken for this visit.  Physical Exam Constitutional:      Appearance: She is well-developed.  HENT:     Head: Normocephalic.     Right Ear: External ear normal.     Left Ear: External ear normal.  Eyes:     Pupils: Pupils are equal, round, and reactive to light.  Neck:     Thyroid: No thyromegaly.     Trachea: No tracheal deviation.  Cardiovascular:     Rate and Rhythm: Normal rate.  Pulmonary:     Effort:  Pulmonary effort is normal.  Abdominal:     Palpations: Abdomen is soft.  Skin:    General: Skin is warm and dry.  Neurological:     Mental Status: She is alert and oriented to person, place, and time.  Psychiatric:        Behavior: Behavior normal.     Ortho Exam patient has crepitus with left knee range of motion.  Negative logroll right and left hips mild tenderness to the right greater trochanter.  Negative straight leg raising 90 degrees anterior tib gastrocsoleus is strong pulses are normal.  No cellulitis.  She does have some tortuous superficial veins using support stockings.  Specialty Comments:  No specialty comments available.  Imaging: No results found.   PMFS History: Patient Active Problem List   Diagnosis Date Noted  . Unilateral primary osteoarthritis, left knee 04/27/2019  . Chondromalacia patellae, left knee 07/02/2016  . Arthritis of left knee 02/25/2016  . Vaginitis and vulvovaginitis, unspecified 08/08/2013  . Leiomyoma of uterus, unspecified 08/08/2013  . Atypical chest pain 03/13/2013  . Abnormal ECG 03/13/2013  . Obesity, unspecified 03/13/2013  . Hypertension    Past Medical History:  Diagnosis Date  . Abnormal ECG 03/13/2013   TWI diffuse. Was seen  prior to cath in 2007. No CAD.  Marland Kitchen Atypical chest pain 03/13/2013  . Hypertension     Family History  Problem Relation Age of Onset  . Hypertension Mother     Past Surgical History:  Procedure Laterality Date  . FOOT SURGERY     Social History   Occupational History  . Not on file  Tobacco Use  . Smoking status: Never Smoker  . Smokeless tobacco: Never Used  Substance and Sexual Activity  . Alcohol use: No  . Drug use: No  . Sexual activity: Yes    Birth control/protection: Post-menopausal

## 2019-06-27 ENCOUNTER — Ambulatory Visit: Payer: Medicare HMO | Admitting: Podiatry

## 2019-06-27 ENCOUNTER — Encounter: Payer: Self-pay | Admitting: Podiatry

## 2019-06-27 ENCOUNTER — Ambulatory Visit (INDEPENDENT_AMBULATORY_CARE_PROVIDER_SITE_OTHER): Payer: Medicare HMO | Admitting: Podiatry

## 2019-06-27 ENCOUNTER — Other Ambulatory Visit: Payer: Self-pay

## 2019-06-27 DIAGNOSIS — L84 Corns and callosities: Secondary | ICD-10-CM

## 2019-06-27 DIAGNOSIS — M79674 Pain in right toe(s): Secondary | ICD-10-CM

## 2019-06-27 DIAGNOSIS — B351 Tinea unguium: Secondary | ICD-10-CM

## 2019-06-27 DIAGNOSIS — M79671 Pain in right foot: Secondary | ICD-10-CM

## 2019-06-27 DIAGNOSIS — M79675 Pain in left toe(s): Secondary | ICD-10-CM

## 2019-06-27 DIAGNOSIS — M79672 Pain in left foot: Secondary | ICD-10-CM

## 2019-06-27 NOTE — Patient Instructions (Addendum)
Peripheral Vascular Disease Peripheral vascular disease (PVD) is a disease of the blood vessels. A simple term for PVD is poor circulation. In most cases, PVD narrows the blood vessels that carry blood from your heart to the rest of your body. This can result in a decreased supply of blood to your arms, legs, and internal organs, like your stomach or kidneys. However, it most often affects a person's lower legs and feet. There are two types of PVD.  Organic PVD. This is the more common type. It is caused by damage to the structure of blood vessels.  Functional PVD. This is caused by conditions that make blood vessels contract and tighten (spasm). Without treatment, PVD tends to get worse over time. PVD can also lead to acute limb ischemia. This is when an arm or leg suddenly has trouble getting enough blood. This is a medical emergency. What are the causes?  Each type of PVD has many different causes. The most common cause of PVD is buildup of a fatty material (plaque) inside your arteries (atherosclerosis). Small amounts of plaque can break off from the walls of the blood vessels and become lodged in a smaller artery. This blocks blood flow and can cause acute limb ischemia. Other common causes of PVD include:  Blood clots that form inside of blood vessels.  Injuries to blood vessels.  Diseases that cause inflammation of blood vessels or cause blood vessel spasms.  Health behaviors and health history that increase your risk of developing PVD. What increases the risk? You are more likely to develop this condition if:  You have a family history of PVD.  You have certain medical conditions, including: ? High cholesterol. ? Diabetes. ? High blood pressure (hypertension). ? Coronary heart disease. ? Past problems with blood clots. ? Past injury, such as burns or a broken bone. These may have damaged blood vessels in your limbs. ? Buerger disease. This is caused by inflamed blood vessels  in your hands and feet. ? Some forms of arthritis. ? Rare birth defects that affect the arteries in your legs. ? Kidney disease.  You use tobacco or smoke.  You do not get enough exercise.  You are obese.  You are age 50 or older. What are the signs or symptoms? This condition may cause different symptoms. Your symptoms depend on what part of your body is not getting enough blood. Some common signs and symptoms include:  Cramps in your lower legs. This may be a symptom of poor leg circulation (claudication).  Pain and weakness in your legs. This happens while you are physically active but goes away when you rest (intermittent claudication).  Leg pain when at rest.  Leg numbness, tingling, or weakness.  Coldness in a leg or foot, especially when compared with the other leg.  Skin or hair changes. These can include: ? Hair loss. ? Shiny skin. ? Pale or bluish skin. ? Thick toenails.  Inability to get or maintain an erection (erectile dysfunction).  Fatigue. People with PVD are more likely to develop ulcers and sores on their toes, feet, or legs. These may take longer than normal to heal. How is this diagnosed? This condition is diagnosed based on:  Your signs and symptoms.  A physical exam and your medical history.  Other tests to find out what is causing your PVD and to determine its severity. Tests may include: ? Blood pressure recordings from your arms and legs and measurements of the strength of your pulses (pulse volume   recordings). ? Imaging studies using sound waves to take pictures of the blood flow through your blood vessels (Doppler ultrasound). ? Injecting a dye into your blood vessels before having imaging studies using:  X-rays (angiogram or arteriogram).  Computer-generated X-rays (CT angiogram).  A powerful electromagnetic field and a computer (magnetic resonance angiogram or MRA). How is this treated? Treatment for PVD depends on the cause of your  condition and how severe your symptoms are. It also depends on your age. Underlying causes need to be treated and controlled. These include long-term (chronic) conditions, such as diabetes, high cholesterol, and high blood pressure. Treatment includes:  Lifestyle changes, such as: ? Quitting smoking. ? Exercising regularly. ? Following a low-fat, low-cholesterol diet.  Taking medicines, such as: ? Blood thinners to prevent blood clots. ? Medicines to improve blood flow. ? Medicines to improve your blood cholesterol levels.  Surgical procedures, such as: ? A procedure that uses an inflated balloon to open a blocked artery and improve blood flow (angioplasty). ? A procedure to put in a wire mesh tube to keep a blocked artery open (stent implant). ? Surgery to reroute blood flow around a blocked artery (peripheral bypass surgery). ? Surgery to remove dead tissue from an infected wound on the affected limb. ? Amputation. This is surgical removal of the affected limb. It may be necessary in cases of acute limb ischemia where there has been no improvement through medical or surgical treatments. Follow these instructions at home: Lifestyle  Do not use any products that contain nicotine or tobacco, such as cigarettes and e-cigarettes. If you need help quitting, ask your health care provider.  Lose weight if you are overweight, and maintain a healthy weight as discussed by your health care provider.  Eat a diet that is low in fat and cholesterol. If you need help, ask your health care provider.  Exercise regularly. Ask your health care provider to suggest some good activities for you. General instructions  Take over-the-counter and prescription medicines only as told by your health care provider.  Take good care of your feet: ? Wear comfortable shoes that fit well. ? Check your feet often for any cuts or sores.  Keep all follow-up visits as told by your health care provider. This is  important. Contact a health care provider if:  You have cramps in your legs while walking.  You have leg pain when you are at rest.  You have coldness in a leg or foot.  Your skin changes.  You have erectile dysfunction.  You have cuts or sores on your feet that are not healing. Get help right away if:  Your arm or leg turns cold, numb, and blue.  Your arms or legs become red, warm, swollen, painful, or numb.  You have chest pain or trouble breathing.  You suddenly have weakness in your face, arm, or leg.  You become very confused or lose the ability to speak.  You suddenly have a very bad headache or lose your vision. Summary  Peripheral vascular disease (PVD) is a disease of the blood vessels.  In most cases, PVD narrows the blood vessels that carry blood from your heart to the rest of your body.  PVD may cause different symptoms. Your symptoms depend on what part of your body is not getting enough blood.  Treatment for PVD depends on the cause of your condition and how severe your symptoms are. This information is not intended to replace advice given to you by your   health care provider. Make sure you discuss any questions you have with your health care provider. Document Revised: 03/11/2017 Document Reviewed: 05/06/2016 Elsevier Patient Education  Alexis Ball are small areas of thickened skin that occur on the top, sides, or tip of a toe. They contain a cone-shaped core with a point that can press on a nerve below. This causes pain.  Calluses are areas of thickened skin that can occur anywhere on the body, including the hands, fingers, palms, soles of the feet, and heels. Calluses are usually larger than corns. What are the causes? Corns and calluses are caused by rubbing (friction) or pressure, such as from shoes that are too tight or do not fit properly. What increases the risk? Corns are more likely to develop in people who have  misshapen toes (toe deformities), such as hammer toes. Calluses can occur with friction to any area of the skin. They are more likely to develop in people who:  Work with their hands.  Wear shoes that fit poorly, are too tight, or are high-heeled.  Have toe deformities. What are the signs or symptoms? Symptoms of a corn or callus include:  A hard growth on the skin.  Pain or tenderness under the skin.  Redness and swelling.  Increased discomfort while wearing tight-fitting shoes, if your feet are affected. If a corn or callus becomes infected, symptoms may include:  Redness and swelling that gets worse.  Pain.  Fluid, blood, or pus draining from the corn or callus. How is this diagnosed? Corns and calluses may be diagnosed based on your symptoms, your medical history, and a physical exam. How is this treated? Treatment for corns and calluses may include:  Removing the cause of the friction or pressure. This may involve: ? Changing your shoes. ? Wearing shoe inserts (orthotics) or other protective layers in your shoes, such as a corn pad. ? Wearing gloves.  Applying medicine to the skin (topical medicine) to help soften skin in the hardened, thickened areas.  Removing layers of dead skin with a file to reduce the size of the corn or callus.  Removing the corn or callus with a scalpel or laser.  Taking antibiotic medicines, if your corn or callus is infected.  Having surgery, if a toe deformity is the cause. Follow these instructions at home:   Take over-the-counter and prescription medicines only as told by your health care provider.  If you were prescribed an antibiotic, take it as told by your health care provider. Do not stop taking it even if your condition starts to improve.  Wear shoes that fit well. Avoid wearing high-heeled shoes and shoes that are too tight or too loose.  Wear any padding, protective layers, gloves, or orthotics as told by your health care  provider.  Soak your hands or feet and then use a file or pumice stone to soften your corn or callus. Do this as told by your health care provider.  Check your corn or callus every day for symptoms of infection. Contact a health care provider if you:  Notice that your symptoms do not improve with treatment.  Have redness or swelling that gets worse.  Notice that your corn or callus becomes painful.  Have fluid, blood, or pus coming from your corn or callus.  Have new symptoms. Summary  Corns are small areas of thickened skin that occur on the top, sides, or tip of a toe.  Calluses are areas of thickened  skin that can occur anywhere on the body, including the hands, fingers, palms, and soles of the feet. Calluses are usually larger than corns.  Corns and calluses are caused by rubbing (friction) or pressure, such as from shoes that are too tight or do not fit properly.  Treatment may include wearing any padding, protective layers, gloves, or orthotics as told by your health care provider. This information is not intended to replace advice given to you by your health care provider. Make sure you discuss any questions you have with your health care provider. Document Revised: 07/19/2018 Document Reviewed: 02/09/2017 Elsevier Patient Education  2020 [employer].

## 2019-07-02 NOTE — Progress Notes (Signed)
Subjective: Alexis Ball presents today for follow up of callus(es) b/l feet and painful mycotic toenails b/l that are difficult to trim. Pain interferes with ambulation. Aggravating factors include wearing enclosed shoe gear. Pain is relieved with periodic professional debridement.   She is also being seen for contusion of her left foot. She states she will follow up with Alexis Ball for this.  No Known Allergies   Objective: There were no vitals filed for this visit.  Pt 64 y.o. year old AA female in NAD. AAO x 3.   Vascular Examination:  Capillary refill time to digits immediate b/l. Palpable DP pulses b/l. Palpable PT pulses b/l. Pedal hair sparse b/l. Skin temperature gradient within normal limits b/l. No pain with calf compression b/l.  Dermatological Examination: Pedal skin with normal turgor, texture and tone bilaterally. No open wounds bilaterally. No interdigital macerations bilaterally. Toenails 1-5 b/l elongated, dystrophic, thickened, crumbly with subungual debris and tenderness to dorsal palpation. Hyperkeratotic lesion(s) submet heads 1, 5 b/l and right hallux.  No erythema, no edema, no drainage, no flocculence.  Musculoskeletal: Normal muscle strength 5/5 to all lower extremity muscle groups bilaterally, no pain crepitus or joint limitation noted with ROM b/l, hammertoes noted to the  L 2nd toe, L 3rd toe and L 4th toe and wearing Cam Walker left lower extremity.   Still has tenderness dorsal aspect left foot.  Neurological: Protective sensation intact 5/5 intact bilaterally with 10g monofilament b/l Vibratory sensation intact b/l.  Assessment: 1. Pain due to onychomycosis of toenails of both feet   2. Callus   3. Pain in both feet    Plan: -Toenails 1-5 b/l were debrided in length and girth with sterile nail nippers and dremel without iatrogenic bleeding.  -Callus(es) submet heads 5 b/l and right hallux were debrided without complication or incident. Total  number debrided =3. -Patient to continue soft, supportive shoe gear daily. -Patient to report any pedal injuries to medical professional immediately. -Patient/POA to call should there be question/concern in the interim.  Return in about 3 months (around 09/27/2019) for nail and callus trim.

## 2019-07-27 ENCOUNTER — Other Ambulatory Visit: Payer: Self-pay

## 2019-07-27 ENCOUNTER — Ambulatory Visit: Payer: Medicare HMO | Admitting: Orthopaedic Surgery

## 2019-08-23 ENCOUNTER — Ambulatory Visit (INDEPENDENT_AMBULATORY_CARE_PROVIDER_SITE_OTHER): Payer: Medicare HMO

## 2019-08-23 ENCOUNTER — Other Ambulatory Visit: Payer: Self-pay

## 2019-08-23 ENCOUNTER — Other Ambulatory Visit: Payer: Self-pay | Admitting: Podiatry

## 2019-08-23 ENCOUNTER — Ambulatory Visit (INDEPENDENT_AMBULATORY_CARE_PROVIDER_SITE_OTHER): Payer: Medicare HMO | Admitting: Podiatry

## 2019-08-23 DIAGNOSIS — M778 Other enthesopathies, not elsewhere classified: Secondary | ICD-10-CM | POA: Diagnosis not present

## 2019-08-23 DIAGNOSIS — M67472 Ganglion, left ankle and foot: Secondary | ICD-10-CM | POA: Diagnosis not present

## 2019-08-23 NOTE — Progress Notes (Signed)
She presents today for follow-up of the stress fracture to the base of the second and third metatarsals of her left foot.  She states that she still has capsulitis that is still too painful at the's first metatarsophalangeal joint left for old bunion was performed.  Objective: Vital signs are stable she is alert and oriented x3 there is no erythema edema cellulitis drainage or odor she has a small ganglion cyst at the second metatarsophalangeal joint area.  She has a minimal pain on palpation of the second metatarsal base.  Radiographs taken today demonstrate a healing fracture site at the mid base.  I see no periostitis by time I do not see the stress lines that were there previously.  Assessment: Ganglion cyst second metatarsophalangeal joint area resolving fracture second metatarsal and then osteoarthritis hallux limitus first metatarsophalangeal joint.  Plan: I injected the area today for the ganglion with a dexamethasone local anesthetic hopefully this will relieve her symptoms there if not then we will go ahead and consider her for surgical intervention we will follow-up with her in 3 weeks consisting of a Keller arthroplasty single silicone implant and a excision of ganglion.

## 2019-09-14 ENCOUNTER — Ambulatory Visit (INDEPENDENT_AMBULATORY_CARE_PROVIDER_SITE_OTHER): Payer: Medicare HMO | Admitting: Podiatry

## 2019-09-14 ENCOUNTER — Other Ambulatory Visit: Payer: Self-pay

## 2019-09-14 DIAGNOSIS — M205X2 Other deformities of toe(s) (acquired), left foot: Secondary | ICD-10-CM | POA: Diagnosis not present

## 2019-09-14 DIAGNOSIS — M67472 Ganglion, left ankle and foot: Secondary | ICD-10-CM

## 2019-09-14 NOTE — Patient Instructions (Signed)
Pre-Operative Instructions  Congratulations, you have decided to take an important step towards improving your quality of life.  You can be assured that the doctors and staff at [facility] will be with you every step of the way.  Here are some important things you should know:  1. Plan to be at the surgery center/hospital at least 1 (one) hour prior to your scheduled time, unless otherwise directed by the surgical center/hospital staff.  You must have a responsible adult accompany you, remain during the surgery and drive you home.  Make sure you have directions to the surgical center/hospital to ensure you arrive on time. 2. If you are having surgery at [facility] or [facility], you will need a copy of your medical history and physical form from your family physician within one month prior to the date of surgery. We will give you a form for your primary physician to complete.  3. We make every effort to accommodate the date you request for surgery.  However, there are times where surgery dates or times have to be moved.  We will contact you as soon as possible if a change in schedule is required.   4. No aspirin/ibuprofen for one week before surgery.  If you are on aspirin, any non-steroidal anti-inflammatory medications (Mobic, Aleve, Ibuprofen) should not be taken seven (7) days prior to your surgery.  You make take Tylenol for pain prior to surgery.  5. Medications - If you are taking daily heart and blood pressure medications, seizure, reflux, allergy, asthma, anxiety, pain or diabetes medications, make sure you notify the surgery center/hospital before the day of surgery so they can tell you which medications you should take or avoid the day of surgery. 6. No food or drink after midnight the night before surgery unless directed otherwise by surgical center/hospital staff. 7. No alcoholic beverages 24-hours prior to surgery.  No smoking 24-hours prior or 24-hours after  surgery. 8. Wear loose pants or shorts. They should be loose enough to fit over bandages, boots, and casts. 9. Don't wear slip-on shoes. Sneakers are preferred. 10. Bring your boot with you to the surgery center/hospital.  Also bring crutches or a walker if your physician has prescribed it for you.  If you do not have this equipment, it will be provided for you after surgery. 11. If you have not been contacted by the surgery center/hospital by the day before your surgery, call to confirm the date and time of your surgery. 12. Leave-time from work may vary depending on the type of surgery you have.  Appropriate arrangements should be made prior to surgery with your employer. 13. Prescriptions will be provided immediately following surgery by your doctor.  Fill these as soon as possible after surgery and take the medication as directed. Pain medications will not be refilled on weekends and must be approved by the doctor. 14. Remove nail polish on the operative foot and avoid getting pedicures prior to surgery. 15. Wash the night before surgery.  The night before surgery wash the foot and leg well with water and the antibacterial soap provided. Be sure to pay special attention to beneath the toenails and in between the toes.  Wash for at least three (3) minutes. Rinse thoroughly with water and dry well with a towel.  Perform this wash unless told not to do so by your physician.  Enclosed: 1 Ice pack (please put in freezer the night before surgery)   1 Hibiclens skin cleaner     Pre-op instructions  If you have any questions regarding the instructions, please do not hesitate to call our office.  [place]: [address], [place], [place] [address] -- [phone]  [place]: [address] [place], [place] [address] -- [phone]  [place]: [address], [place], [place] [address] -- [phone]   Website: [email or web address] 

## 2019-09-14 NOTE — Progress Notes (Signed)
She presents today for follow-up of her painful first metatarsophalangeal joint.  She states that she is ready to have surgery on it and get the cyst off of her top of her foot as she points to the second metatarsophalangeal joint area.  She states that her previous bunion surgery has gone on to become painful and knows that it needs to be corrected.  ROS: Denies fever chills nausea vomiting muscle aches pains calf pain back pain chest pain shortness of breath.  Objective: Vital signs are stable she is alert oriented x3 has pain with attempted range of motion of the first metatarsophalangeal joint with crepitation.  Radiographs were reviewed today demonstrate complete loss of joint space subchondral sclerosis and eburnation.  Painful internal fixation is still intact the head of the first metatarsal or screw was placed for bunionectomy.  She also has a palpable mass to the dorsal aspect of the second third metatarsophalangeal joint most likely due to compensation and stressors on the joints.  Assessment: Painful ganglion cyst dorsal aspect left foot overlying the second metatarsophalangeal joints.  Painful first metatarsophalangeal joint secondary to internal fixation as well as osteoarthritis.  Plan: Discussed etiology pathology conservative surgical therapies at this point were going to perform a Keller arthroplasty with a single silicone implant and remove the internal fixation.  We are also going to remove the cysts overlying the second metatarsophalangeal joint.  We did discuss the possible postop complications which may include but not limited to postop pain bleeding swelling infection recurrence need for further surgery overcorrection under correction also digit loss of limb loss of life.

## 2019-09-20 DIAGNOSIS — I10 Essential (primary) hypertension: Secondary | ICD-10-CM | POA: Diagnosis not present

## 2019-09-20 DIAGNOSIS — E1169 Type 2 diabetes mellitus with other specified complication: Secondary | ICD-10-CM | POA: Diagnosis not present

## 2019-09-21 ENCOUNTER — Telehealth: Payer: Self-pay

## 2019-09-21 NOTE — Telephone Encounter (Signed)
DOS 10/05/2019  KELLER BUNION IMPLANT LT - 70350 EXC GANGLION CYST LT - 28090  [employer] MEDICARE EFFECTIVE DATE - 09/11/2018  PLAN DEDUCTIBLE - $203.00 W/$0.00 REMAINING OUT OF POCKET - $0.00 COPAY $0.00 COINSURANCE - 80%  PER MARY AT [employer] (CALL REF # [id number]) NO PRECERT REQUIRED FOR CPT [phone] OR [phone]

## 2019-09-28 ENCOUNTER — Other Ambulatory Visit: Payer: Self-pay

## 2019-09-28 ENCOUNTER — Ambulatory Visit (INDEPENDENT_AMBULATORY_CARE_PROVIDER_SITE_OTHER): Payer: Medicare HMO | Admitting: Podiatry

## 2019-09-28 ENCOUNTER — Encounter: Payer: Self-pay | Admitting: Podiatry

## 2019-09-28 DIAGNOSIS — M79675 Pain in left toe(s): Secondary | ICD-10-CM

## 2019-09-28 DIAGNOSIS — M79674 Pain in right toe(s): Secondary | ICD-10-CM | POA: Diagnosis not present

## 2019-09-28 DIAGNOSIS — M79672 Pain in left foot: Secondary | ICD-10-CM

## 2019-09-28 DIAGNOSIS — B351 Tinea unguium: Secondary | ICD-10-CM

## 2019-09-28 DIAGNOSIS — L84 Corns and callosities: Secondary | ICD-10-CM

## 2019-09-28 DIAGNOSIS — M79671 Pain in right foot: Secondary | ICD-10-CM

## 2019-09-28 NOTE — Patient Instructions (Signed)
c Corns and Calluses Corns are small areas of thickened skin that occur on the top, sides, or tip of a toe. They contain a cone-shaped core with a point that can press on a nerve below. This causes pain.  Calluses are areas of thickened skin that can occur anywhere on the body, including the hands, fingers, palms, soles of the feet, and heels. Calluses are usually larger than corns. What are the causes? Corns and calluses are caused by rubbing (friction) or pressure, such as from shoes that are too tight or do not fit properly. What increases the risk? Corns are more likely to develop in people who have misshapen toes (toe deformities), such as hammer toes. Calluses can occur with friction to any area of the skin. They are more likely to develop in people who:  Work with their hands.  Wear shoes that fit poorly, are too tight, or are high-heeled.  Have toe deformities. What are the signs or symptoms? Symptoms of a corn or callus include:  A hard growth on the skin.  Pain or tenderness under the skin.  Redness and swelling.  Increased discomfort while wearing tight-fitting shoes, if your feet are affected. If a corn or callus becomes infected, symptoms may include:  Redness and swelling that gets worse.  Pain.  Fluid, blood, or pus draining from the corn or callus. How is this diagnosed? Corns and calluses may be diagnosed based on your symptoms, your medical history, and a physical exam. How is this treated? Treatment for corns and calluses may include:  Removing the cause of the friction or pressure. This may involve: ? Changing your shoes. ? Wearing shoe inserts (orthotics) or other protective layers in your shoes, such as a corn pad. ? Wearing gloves.  Applying medicine to the skin (topical medicine) to help soften skin in the hardened, thickened areas.  Removing layers of dead skin with a file to reduce the size of the corn or callus.  Removing the corn or callus  with a scalpel or laser.  Taking antibiotic medicines, if your corn or callus is infected.  Having surgery, if a toe deformity is the cause. Follow these instructions at home:   Take over-the-counter and prescription medicines only as told by your health care provider.  If you were prescribed an antibiotic, take it as told by your health care provider. Do not stop taking it even if your condition starts to improve.  Wear shoes that fit well. Avoid wearing high-heeled shoes and shoes that are too tight or too loose.  Wear any padding, protective layers, gloves, or orthotics as told by your health care provider.  Soak your hands or feet and then use a file or pumice stone to soften your corn or callus. Do this as told by your health care provider.  Check your corn or callus every day for symptoms of infection. Contact a health care provider if you:  Notice that your symptoms do not improve with treatment.  Have redness or swelling that gets worse.  Notice that your corn or callus becomes painful.  Have fluid, blood, or pus coming from your corn or callus.  Have new symptoms. Summary  Corns are small areas of thickened skin that occur on the top, sides, or tip of a toe.  Calluses are areas of thickened skin that can occur anywhere on the body, including the hands, fingers, palms, and soles of the feet. Calluses are usually larger than corns.  Corns and calluses are caused  by rubbing (friction) or pressure, such as from shoes that are too tight or do not fit properly.  Treatment may include wearing any padding, protective layers, gloves, or orthotics as told by your health care provider. This information is not intended to replace advice given to you by your health care provider. Make sure you discuss any questions you have with your health care provider. Document Revised: 07/19/2018 Document Reviewed: 02/09/2017 Elsevier Patient Education  2020 [employer].

## 2019-10-03 ENCOUNTER — Other Ambulatory Visit: Payer: Self-pay | Admitting: Podiatry

## 2019-10-03 MED ORDER — OXYCODONE-ACETAMINOPHEN 10-325 MG PO TABS
1.0000 | ORAL_TABLET | Freq: Three times a day (TID) | ORAL | 0 refills | Status: AC | PRN
Start: 2019-10-03 — End: 2019-10-10

## 2019-10-03 MED ORDER — ONDANSETRON HCL 4 MG PO TABS: 4.0000 mg | ORAL_TABLET | Freq: Three times a day (TID) | ORAL | 0 refills | Status: DC | PRN

## 2019-10-03 MED ORDER — CEPHALEXIN 500 MG PO CAPS: 500.0000 mg | ORAL_CAPSULE | Freq: Three times a day (TID) | ORAL | 0 refills | Status: DC

## 2019-10-05 DIAGNOSIS — T8484XA Pain due to internal orthopedic prosthetic devices, implants and grafts, initial encounter: Secondary | ICD-10-CM | POA: Diagnosis not present

## 2019-10-05 DIAGNOSIS — Z4889 Encounter for other specified surgical aftercare: Secondary | ICD-10-CM

## 2019-10-05 DIAGNOSIS — M7989 Other specified soft tissue disorders: Secondary | ICD-10-CM | POA: Diagnosis not present

## 2019-10-05 DIAGNOSIS — M67472 Ganglion, left ankle and foot: Secondary | ICD-10-CM

## 2019-10-05 DIAGNOSIS — M205X2 Other deformities of toe(s) (acquired), left foot: Secondary | ICD-10-CM

## 2019-10-05 DIAGNOSIS — I10 Essential (primary) hypertension: Secondary | ICD-10-CM | POA: Diagnosis not present

## 2019-10-05 DIAGNOSIS — M25572 Pain in left ankle and joints of left foot: Secondary | ICD-10-CM | POA: Diagnosis not present

## 2019-10-06 NOTE — Progress Notes (Signed)
Subjective: Alexis Ball is a pleasant 64 y.o. female patient seen today painful callus(es) b/l feet and painful mycotic toenails b/l that are difficult to trim. Pain interferes with ambulation. Aggravating factors include wearing enclosed shoe gear. Pain is relieved with periodic professional debridement.   She states she will be having surgery for cyst on her right foot with Dr. Milinda Pointer on next week.  She voices no new pedal problems on today's visit.  Past Medical History:  Diagnosis Date  . Abnormal ECG 03/13/2013   TWI diffuse. Was seen prior to cath in 2007. No CAD.  Marland Kitchen Atypical chest pain 03/13/2013  . Hypertension     Patient Active Problem List   Diagnosis Date Noted  . Unilateral primary osteoarthritis, left knee 04/27/2019  . Chondromalacia patellae, left knee 07/02/2016  . Arthritis of left knee 02/25/2016  . Vaginitis and vulvovaginitis, unspecified 08/08/2013  . Leiomyoma of uterus, unspecified 08/08/2013  . Atypical chest pain 03/13/2013  . Abnormal ECG 03/13/2013  . Obesity, unspecified 03/13/2013  . Hypertension     Current Outpatient Medications on File Prior to Visit  Medication Sig Dispense Refill  . aspirin EC 81 MG tablet Take 81 mg by mouth daily.    . bimatoprost (LUMIGAN) 0.01 % SOLN Place 1 drop into both eyes daily.    . brimonidine (ALPHAGAN) 0.15 % ophthalmic solution Place 1 drop into both eyes 2 (two) times daily.     . dorzolamide (TRUSOPT) 2 % ophthalmic solution INSTILL 1 DROP INTO BOTH EYES TWICE A DAY    . dorzolamide-timolol (COSOPT) 22.3-6.8 MG/ML ophthalmic solution Place 1 drop into both eyes 2 (two) times daily.     . ferrous sulfate 325 (65 FE) MG tablet Take 325 mg by mouth daily with breakfast.    . gabapentin (NEURONTIN) 100 MG capsule     . gabapentin (NEURONTIN) 300 MG capsule TK 1 C PO IN THE MORNING AND 2 CS PO HS    . hydrochlorothiazide (HYDRODIURIL) 25 MG tablet Take 25 mg by mouth daily.    Marland Kitchen ibuprofen (ADVIL,MOTRIN) 200 MG  tablet Take 600-800 mg by mouth every 6 (six) hours as needed for headache (pain).    Marland Kitchen lidocaine (XYLOCAINE) 5 % ointment Apply 1 application topically 2 (two) times daily.    . meloxicam (MOBIC) 7.5 MG tablet Take 1 tablet (7.5 mg total) by mouth daily. 60 tablet 1  . montelukast (SINGULAIR) 10 MG tablet TK 1 T PO QHS    . polyethylene glycol (MIRALAX) 17 g packet Take 17 g by mouth daily as needed for moderate constipation or severe constipation. 5 each 0  . Polyvinyl Alcohol-Povidone (REFRESH OP) Place 1 drop into both eyes 3 (three) times daily as needed (dry eyes).     . potassium chloride SA (K-DUR,KLOR-CON) 20 MEQ tablet Take 20 mEq by mouth daily.    . timolol (TIMOPTIC) 0.5 % ophthalmic solution     . triamcinolone cream (KENALOG) 0.1 % Apply 1 application topically 2 (two) times daily. 30 g 0   No current facility-administered medications on file prior to visit.    No Known Allergies  Objective: Physical Exam  General: Alexis Ball is a pleasant 64 y.o. African American female, WD, WN in NAD. AAO x 3.   Vascular:  Neurovascular status unchanged b/l lower extremities. Capillary refill time to digits immediate b/l. Palpable pedal pulses b/l LE. Pedal hair sparse. Lower extremity skin temperature gradient within normal limits. No pain with calf compression b/l.  Dermatological:  Pedal skin with normal turgor, texture and tone bilaterally. No open wounds bilaterally. No interdigital macerations bilaterally. Toenails 1-5 b/l elongated, discolored, dystrophic, thickened, crumbly with subungual debris and tenderness to dorsal palpation. Hyperkeratotic lesion(s) R hallux, submet head 1 left foot, submet head 1 right foot, submet head 5 left foot and submet head 5 right foot.  No erythema, no edema, no drainage, no flocculence.  Musculoskeletal:  Normal muscle strength 5/5 to all lower extremity muscle groups bilaterally. No pain crepitus or joint limitation noted with ROM b/l.  Hammertoes noted to the L 2nd toe, L 3rd toe and L 4th toe.  Neurological:  Protective sensation intact 5/5 intact bilaterally with 10g monofilament b/l. Vibratory sensation intact b/l. Proprioception intact bilaterally. Babinski reflex negative b/l.  Assessment and Plan:  1. Pain due to onychomycosis of toenails of both feet   2. Callus   3. Pain in both feet    -Examined patient. -No new findings. No new orders. -Continue diabetic foot care principles. -Toenails 1-5 b/l were debrided in length and girth with sterile nail nippers and dremel without iatrogenic bleeding.  -Callus(es) R hallux, submet head 1 left foot, submet head 1 right foot, submet head 5 left foot and submet head 5 right foot pared utilizing sterile scalpel blade without complication or incident. Total number debrided =5. -Patient to report any pedal injuries to medical professional immediately. -Patient to continue soft, supportive shoe gear daily. -Patient/POA to call should there be question/concern in the interim.  Return in about 3 months (around 12/29/2019) for nail and callus trim.  Marzetta Board, DPM

## 2019-10-09 ENCOUNTER — Telehealth: Payer: Self-pay | Admitting: Podiatry

## 2019-10-09 ENCOUNTER — Encounter: Payer: Self-pay | Admitting: Podiatry

## 2019-10-09 NOTE — Telephone Encounter (Signed)
Pt calling stating that she needs a stronger pain medication

## 2019-10-09 NOTE — Telephone Encounter (Signed)
Left message informing pt that she if she was able to take ibuprofen she could take 800mg  three times a day between the doses of the percocet to prolong the pain management coverage, not to be up on the foot or have it below the heart for more than 15 minute/hour, and to call with concern.

## 2019-10-10 DIAGNOSIS — I10 Essential (primary) hypertension: Secondary | ICD-10-CM | POA: Diagnosis not present

## 2019-10-10 DIAGNOSIS — M130 Polyarthritis, unspecified: Secondary | ICD-10-CM | POA: Diagnosis not present

## 2019-10-10 DIAGNOSIS — E1149 Type 2 diabetes mellitus with other diabetic neurological complication: Secondary | ICD-10-CM | POA: Diagnosis not present

## 2019-10-11 ENCOUNTER — Ambulatory Visit (INDEPENDENT_AMBULATORY_CARE_PROVIDER_SITE_OTHER): Payer: Medicare HMO | Admitting: Podiatry

## 2019-10-11 ENCOUNTER — Ambulatory Visit (INDEPENDENT_AMBULATORY_CARE_PROVIDER_SITE_OTHER): Payer: Medicare HMO

## 2019-10-11 ENCOUNTER — Encounter: Payer: Self-pay | Admitting: Podiatry

## 2019-10-11 ENCOUNTER — Other Ambulatory Visit: Payer: Self-pay

## 2019-10-11 ENCOUNTER — Other Ambulatory Visit: Payer: Self-pay | Admitting: Podiatry

## 2019-10-11 DIAGNOSIS — M205X2 Other deformities of toe(s) (acquired), left foot: Secondary | ICD-10-CM | POA: Diagnosis not present

## 2019-10-11 DIAGNOSIS — Z8739 Personal history of other diseases of the musculoskeletal system and connective tissue: Secondary | ICD-10-CM

## 2019-10-11 DIAGNOSIS — Z9889 Other specified postprocedural states: Secondary | ICD-10-CM

## 2019-10-11 DIAGNOSIS — M67472 Ganglion, left ankle and foot: Secondary | ICD-10-CM

## 2019-10-11 DIAGNOSIS — L089 Local infection of the skin and subcutaneous tissue, unspecified: Secondary | ICD-10-CM

## 2019-10-11 HISTORY — DX: Local infection of the skin and subcutaneous tissue, unspecified: L08.9

## 2019-10-11 HISTORY — DX: Personal history of other diseases of the musculoskeletal system and connective tissue: Z87.39

## 2019-10-11 MED ORDER — OXYCODONE-ACETAMINOPHEN 10-325 MG PO TABS: 1.0000 | ORAL_TABLET | Freq: Three times a day (TID) | ORAL | 0 refills | Status: DC | PRN

## 2019-10-11 MED ORDER — CLINDAMYCIN HCL 150 MG PO CAPS
150.0000 mg | ORAL_CAPSULE | Freq: Three times a day (TID) | ORAL | 0 refills | Status: DC
Start: 2019-10-11 — End: 2019-11-10

## 2019-10-11 NOTE — Progress Notes (Signed)
She presents today for her first postop visit date of surgery 10/05/2019 status post Jake Michaelis bunionectomy with single silicone implant left foot.  Excision of a fibrous mass dorsal aspect of the second and third metatarsal phalangeal joint area.  States that she tripped over her daughter's rug falling and causing her foot started bleeding.  She says it hurts terribly.  Objective: Vital signs are stable she is alert oriented x3 dressed her dressing intact with active bleeding overlying the distal aspect the dressing itself appears to be wet her heel appears to be wet the boot appears to have been wet but is he has dry.  Radiographs taken today demonstrate first metatarsophalangeal joint replacement is intact does not appear to be dislocated but does demonstrate considerable amount of bleeding around it.  I see no gas in the tissues.  Dressing is Fallon wet once removed demonstrates sutures are intact margins are well coapted though there is some bleeding from the second intermetatarsal space.  This is associated with pulling of the sutures most likely.  They appear to be intact but there was a little bit of dehiscence.  Assessment 1 week status post Keller arthroplasty with single silicone implant and excision of a fibroma.  Possible skin infection due to noncompliance.  Plan: At this point I redressed the foot after cleaning it with alcohol.  I placed her in her cam walker once again.  Explained to her how to walk on it and how to start moving the toes on a regular basis.  She understands and is amenable to it understands that she cannot get this wet.  I refilled her pain medicine and changed her medication to clindamycin.  Follow-up with her in 1 week

## 2019-10-12 ENCOUNTER — Telehealth: Payer: Self-pay | Admitting: *Deleted

## 2019-10-12 NOTE — Telephone Encounter (Signed)
I spoke with pt and she states she started bleeding gushing pus this morning. I asked pt if she had a fever and what had changed for her to call at this time. Pt states she had someone else to call but they didn't. I spoke with Dr. Milinda Pointer and he stated if she had that much pus she needed to go to the ED for possible IV antibiotics. I informed pt of Dr. Stephenie Acres concern and to go to the ED. Pt agreed.

## 2019-10-12 NOTE — Telephone Encounter (Signed)
Pt states she is bleeding pus.

## 2019-10-13 ENCOUNTER — Inpatient Hospital Stay (HOSPITAL_COMMUNITY)
Admission: EM | Admit: 2019-10-13 | Discharge: 2019-11-09 | DRG: 464 | Disposition: A | Discharge status: Skilled Nursing Facility | Payer: Medicare HMO | Attending: Internal Medicine | Admitting: Internal Medicine

## 2019-10-13 ENCOUNTER — Encounter (HOSPITAL_COMMUNITY): Payer: Self-pay | Admitting: Emergency Medicine

## 2019-10-13 ENCOUNTER — Telehealth: Payer: Self-pay | Admitting: Podiatry

## 2019-10-13 ENCOUNTER — Other Ambulatory Visit: Payer: Self-pay

## 2019-10-13 DIAGNOSIS — Z8249 Family history of ischemic heart disease and other diseases of the circulatory system: Secondary | ICD-10-CM

## 2019-10-13 DIAGNOSIS — E669 Obesity, unspecified: Secondary | ICD-10-CM | POA: Diagnosis present

## 2019-10-13 DIAGNOSIS — R609 Edema, unspecified: Secondary | ICD-10-CM

## 2019-10-13 DIAGNOSIS — E876 Hypokalemia: Secondary | ICD-10-CM | POA: Diagnosis not present

## 2019-10-13 DIAGNOSIS — Z79899 Other long term (current) drug therapy: Secondary | ICD-10-CM

## 2019-10-13 DIAGNOSIS — W010XXA Fall on same level from slipping, tripping and stumbling without subsequent striking against object, initial encounter: Secondary | ICD-10-CM | POA: Diagnosis present

## 2019-10-13 DIAGNOSIS — I82819 Embolism and thrombosis of superficial veins of unspecified lower extremities: Secondary | ICD-10-CM | POA: Diagnosis not present

## 2019-10-13 DIAGNOSIS — L02612 Cutaneous abscess of left foot: Secondary | ICD-10-CM | POA: Diagnosis present

## 2019-10-13 DIAGNOSIS — Z539 Procedure and treatment not carried out, unspecified reason: Secondary | ICD-10-CM

## 2019-10-13 DIAGNOSIS — L03116 Cellulitis of left lower limb: Secondary | ICD-10-CM | POA: Diagnosis present

## 2019-10-13 DIAGNOSIS — Z6832 Body mass index (BMI) 32.0-32.9, adult: Secondary | ICD-10-CM

## 2019-10-13 DIAGNOSIS — T8469XA Infection and inflammatory reaction due to internal fixation device of other site, initial encounter: Principal | ICD-10-CM | POA: Diagnosis present

## 2019-10-13 DIAGNOSIS — Z8673 Personal history of transient ischemic attack (TIA), and cerebral infarction without residual deficits: Secondary | ICD-10-CM

## 2019-10-13 DIAGNOSIS — Z7982 Long term (current) use of aspirin: Secondary | ICD-10-CM

## 2019-10-13 DIAGNOSIS — E1169 Type 2 diabetes mellitus with other specified complication: Secondary | ICD-10-CM | POA: Diagnosis present

## 2019-10-13 DIAGNOSIS — J449 Chronic obstructive pulmonary disease, unspecified: Secondary | ICD-10-CM | POA: Diagnosis present

## 2019-10-13 DIAGNOSIS — I82611 Acute embolism and thrombosis of superficial veins of right upper extremity: Secondary | ICD-10-CM

## 2019-10-13 DIAGNOSIS — Z20822 Contact with and (suspected) exposure to covid-19: Secondary | ICD-10-CM | POA: Diagnosis not present

## 2019-10-13 DIAGNOSIS — E785 Hyperlipidemia, unspecified: Secondary | ICD-10-CM | POA: Diagnosis present

## 2019-10-13 DIAGNOSIS — M25511 Pain in right shoulder: Secondary | ICD-10-CM | POA: Diagnosis not present

## 2019-10-13 DIAGNOSIS — I10 Essential (primary) hypertension: Secondary | ICD-10-CM | POA: Diagnosis present

## 2019-10-13 DIAGNOSIS — Z791 Long term (current) use of non-steroidal anti-inflammatories (NSAID): Secondary | ICD-10-CM

## 2019-10-13 DIAGNOSIS — T8140XA Infection following a procedure, unspecified, initial encounter: Secondary | ICD-10-CM | POA: Diagnosis not present

## 2019-10-13 DIAGNOSIS — I2510 Atherosclerotic heart disease of native coronary artery without angina pectoris: Secondary | ICD-10-CM | POA: Diagnosis present

## 2019-10-13 DIAGNOSIS — D509 Iron deficiency anemia, unspecified: Secondary | ICD-10-CM | POA: Diagnosis present

## 2019-10-13 DIAGNOSIS — M79672 Pain in left foot: Secondary | ICD-10-CM | POA: Diagnosis not present

## 2019-10-13 DIAGNOSIS — L089 Local infection of the skin and subcutaneous tissue, unspecified: Secondary | ICD-10-CM

## 2019-10-13 DIAGNOSIS — M868X7 Other osteomyelitis, ankle and foot: Secondary | ICD-10-CM | POA: Diagnosis present

## 2019-10-13 DIAGNOSIS — M7989 Other specified soft tissue disorders: Secondary | ICD-10-CM | POA: Diagnosis present

## 2019-10-13 DIAGNOSIS — M71072 Abscess of bursa, left ankle and foot: Secondary | ICD-10-CM

## 2019-10-13 NOTE — Telephone Encounter (Signed)
Patient called for issue with her incision. I missed her call for an hour as I was in the operating room. States that her incision busted open, is heavily draining, causing a lot of pain, and has maggots coming out of it. I advised her to go to the emergency room. She stated she was already at the ED in the parking lot.  Depending on appearance she may need debridement and antibiotics.  I advised her that the emergency physicians can call me for further discussion for consult if she is admitted.  Evelina Bucy, DPM

## 2019-10-13 NOTE — ED Triage Notes (Signed)
Pt states she needs her left great toe wound check by a Dr. Having increase pain.

## 2019-10-14 ENCOUNTER — Encounter (HOSPITAL_COMMUNITY): Payer: Self-pay | Admitting: Internal Medicine

## 2019-10-14 ENCOUNTER — Emergency Department (HOSPITAL_COMMUNITY): Payer: Medicare HMO

## 2019-10-14 ENCOUNTER — Inpatient Hospital Stay (HOSPITAL_COMMUNITY): Payer: Medicare HMO | Admitting: Certified Registered Nurse Anesthetist

## 2019-10-14 ENCOUNTER — Encounter (HOSPITAL_COMMUNITY)
Admission: EM | Disposition: A | Discharge status: Skilled Nursing Facility | Payer: Self-pay | Source: Home / Self Care | Attending: Internal Medicine

## 2019-10-14 ENCOUNTER — Inpatient Hospital Stay (HOSPITAL_COMMUNITY): Payer: Medicare HMO

## 2019-10-14 DIAGNOSIS — M868X7 Other osteomyelitis, ankle and foot: Secondary | ICD-10-CM | POA: Diagnosis not present

## 2019-10-14 DIAGNOSIS — I82611 Acute embolism and thrombosis of superficial veins of right upper extremity: Secondary | ICD-10-CM | POA: Diagnosis not present

## 2019-10-14 DIAGNOSIS — L02612 Cutaneous abscess of left foot: Secondary | ICD-10-CM | POA: Diagnosis present

## 2019-10-14 DIAGNOSIS — M71072 Abscess of bursa, left ankle and foot: Secondary | ICD-10-CM

## 2019-10-14 DIAGNOSIS — Z8673 Personal history of transient ischemic attack (TIA), and cerebral infarction without residual deficits: Secondary | ICD-10-CM | POA: Diagnosis not present

## 2019-10-14 DIAGNOSIS — E876 Hypokalemia: Secondary | ICD-10-CM | POA: Diagnosis not present

## 2019-10-14 DIAGNOSIS — L03116 Cellulitis of left lower limb: Secondary | ICD-10-CM | POA: Diagnosis not present

## 2019-10-14 DIAGNOSIS — Z7982 Long term (current) use of aspirin: Secondary | ICD-10-CM | POA: Diagnosis not present

## 2019-10-14 DIAGNOSIS — M25511 Pain in right shoulder: Secondary | ICD-10-CM | POA: Diagnosis not present

## 2019-10-14 DIAGNOSIS — M869 Osteomyelitis, unspecified: Secondary | ICD-10-CM | POA: Diagnosis not present

## 2019-10-14 DIAGNOSIS — R52 Pain, unspecified: Secondary | ICD-10-CM | POA: Diagnosis not present

## 2019-10-14 DIAGNOSIS — D259 Leiomyoma of uterus, unspecified: Secondary | ICD-10-CM | POA: Diagnosis not present

## 2019-10-14 DIAGNOSIS — Z8249 Family history of ischemic heart disease and other diseases of the circulatory system: Secondary | ICD-10-CM | POA: Diagnosis not present

## 2019-10-14 DIAGNOSIS — T148XXA Other injury of unspecified body region, initial encounter: Secondary | ICD-10-CM

## 2019-10-14 DIAGNOSIS — I2510 Atherosclerotic heart disease of native coronary artery without angina pectoris: Secondary | ICD-10-CM | POA: Diagnosis present

## 2019-10-14 DIAGNOSIS — I82819 Embolism and thrombosis of superficial veins of unspecified lower extremities: Secondary | ICD-10-CM | POA: Diagnosis not present

## 2019-10-14 DIAGNOSIS — Z20822 Contact with and (suspected) exposure to covid-19: Secondary | ICD-10-CM | POA: Diagnosis not present

## 2019-10-14 DIAGNOSIS — I10 Essential (primary) hypertension: Secondary | ICD-10-CM | POA: Diagnosis not present

## 2019-10-14 DIAGNOSIS — Z791 Long term (current) use of non-steroidal anti-inflammatories (NSAID): Secondary | ICD-10-CM | POA: Diagnosis not present

## 2019-10-14 DIAGNOSIS — T8469XA Infection and inflammatory reaction due to internal fixation device of other site, initial encounter: Secondary | ICD-10-CM | POA: Diagnosis not present

## 2019-10-14 DIAGNOSIS — M86172 Other acute osteomyelitis, left ankle and foot: Secondary | ICD-10-CM | POA: Diagnosis not present

## 2019-10-14 DIAGNOSIS — M7989 Other specified soft tissue disorders: Secondary | ICD-10-CM | POA: Diagnosis not present

## 2019-10-14 DIAGNOSIS — Z6832 Body mass index (BMI) 32.0-32.9, adult: Secondary | ICD-10-CM | POA: Diagnosis not present

## 2019-10-14 DIAGNOSIS — M1712 Unilateral primary osteoarthritis, left knee: Secondary | ICD-10-CM | POA: Diagnosis not present

## 2019-10-14 DIAGNOSIS — E785 Hyperlipidemia, unspecified: Secondary | ICD-10-CM | POA: Diagnosis present

## 2019-10-14 DIAGNOSIS — J449 Chronic obstructive pulmonary disease, unspecified: Secondary | ICD-10-CM | POA: Diagnosis present

## 2019-10-14 DIAGNOSIS — R600 Localized edema: Secondary | ICD-10-CM | POA: Diagnosis not present

## 2019-10-14 DIAGNOSIS — T847XXA Infection and inflammatory reaction due to other internal orthopedic prosthetic devices, implants and grafts, initial encounter: Secondary | ICD-10-CM | POA: Diagnosis not present

## 2019-10-14 DIAGNOSIS — M79672 Pain in left foot: Secondary | ICD-10-CM | POA: Diagnosis not present

## 2019-10-14 DIAGNOSIS — E1169 Type 2 diabetes mellitus with other specified complication: Secondary | ICD-10-CM | POA: Diagnosis not present

## 2019-10-14 DIAGNOSIS — Z79899 Other long term (current) drug therapy: Secondary | ICD-10-CM | POA: Diagnosis not present

## 2019-10-14 DIAGNOSIS — B952 Enterococcus as the cause of diseases classified elsewhere: Secondary | ICD-10-CM | POA: Diagnosis not present

## 2019-10-14 DIAGNOSIS — E669 Obesity, unspecified: Secondary | ICD-10-CM | POA: Diagnosis present

## 2019-10-14 DIAGNOSIS — W010XXA Fall on same level from slipping, tripping and stumbling without subsequent striking against object, initial encounter: Secondary | ICD-10-CM | POA: Diagnosis not present

## 2019-10-14 DIAGNOSIS — L089 Local infection of the skin and subcutaneous tissue, unspecified: Secondary | ICD-10-CM | POA: Diagnosis not present

## 2019-10-14 DIAGNOSIS — D509 Iron deficiency anemia, unspecified: Secondary | ICD-10-CM | POA: Diagnosis present

## 2019-10-14 DIAGNOSIS — S91302A Unspecified open wound, left foot, initial encounter: Secondary | ICD-10-CM | POA: Diagnosis not present

## 2019-10-14 DIAGNOSIS — R609 Edema, unspecified: Secondary | ICD-10-CM | POA: Diagnosis not present

## 2019-10-14 DIAGNOSIS — D500 Iron deficiency anemia secondary to blood loss (chronic): Secondary | ICD-10-CM | POA: Diagnosis not present

## 2019-10-14 DIAGNOSIS — Z472 Encounter for removal of internal fixation device: Secondary | ICD-10-CM | POA: Diagnosis not present

## 2019-10-14 HISTORY — PX: IRRIGATION AND DEBRIDEMENT FOOT: SHX6602

## 2019-10-14 HISTORY — PX: BONE BIOPSY: SHX375

## 2019-10-14 LAB — BASIC METABOLIC PANEL
Anion gap: 9 (ref 5–15)
BUN: 16 mg/dL (ref 8–23)
CO2: 28 mmol/L (ref 22–32)
Calcium: 9.4 mg/dL (ref 8.9–10.3)
Chloride: 100 mmol/L (ref 98–111)
Creatinine, Ser: 0.8 mg/dL (ref 0.44–1.00)
GFR calc Af Amer: >60 mL/min (ref >60)
GFR calc non Af Amer: >60 mL/min (ref >60)
Glucose, Bld: 102 mg/dL — ABNORMAL HIGH (ref 70–99)
Potassium: 4 mmol/L (ref 3.5–5.1)
Sodium: 137 mmol/L (ref 135–145)

## 2019-10-14 LAB — CBC WITH DIFFERENTIAL/PLATELET
Abs Immature Granulocytes: 0.08 10*3/uL — ABNORMAL HIGH (ref 0.00–0.07)
Basophils Absolute: 0.1 10*3/uL (ref 0.0–0.1)
Basophils Relative: 1 %
Eosinophils Absolute: 0.2 10*3/uL (ref 0.0–0.5)
Eosinophils Relative: 2 %
HCT: 36.5 % (ref 36.0–46.0)
Hemoglobin: 11.9 g/dL — ABNORMAL LOW (ref 12.0–15.0)
Immature Granulocytes: 1 %
Lymphocytes Relative: 16 %
Lymphs Abs: 1.8 10*3/uL (ref 0.7–4.0)
MCH: 30.1 pg (ref 26.0–34.0)
MCHC: 32.6 g/dL (ref 30.0–36.0)
MCV: 92.2 fL (ref 80.0–100.0)
Monocytes Absolute: 1 10*3/uL (ref 0.1–1.0)
Monocytes Relative: 8 %
Neutro Abs: 8.4 10*3/uL — ABNORMAL HIGH (ref 1.7–7.7)
Neutrophils Relative %: 72 %
Platelets: 439 10*3/uL — ABNORMAL HIGH (ref 150–400)
RBC: 3.96 MIL/uL (ref 3.87–5.11)
RDW: 12 % (ref 11.5–15.5)
WBC: 11.6 10*3/uL — ABNORMAL HIGH (ref 4.0–10.5)
nRBC: 0 % (ref 0.0–0.2)

## 2019-10-14 LAB — HIV ANTIBODY (ROUTINE TESTING W REFLEX): HIV Screen 4th Generation wRfx: NONREACTIVE

## 2019-10-14 LAB — SARS CORONAVIRUS 2 BY RT PCR (HOSPITAL ORDER, PERFORMED IN ~~LOC~~ HOSPITAL LAB): SARS Coronavirus 2: NEGATIVE

## 2019-10-14 SURGERY — IRRIGATION AND DEBRIDEMENT FOOT
Anesthesia: Monitor Anesthesia Care | Site: Foot | Laterality: Left

## 2019-10-14 MED ORDER — POLYETHYLENE GLYCOL 3350 17 G PO PACK
17.0000 g | PACK | Freq: Every day | ORAL | Status: DC | PRN
  Administered 2019-11-05: 17 g via ORAL
  Filled 2019-10-14: qty 1

## 2019-10-14 MED ORDER — BRIMONIDINE TARTRATE 0.15 % OP SOLN
1.0000 [drp] | Freq: Two times a day (BID) | OPHTHALMIC | Status: DC
  Administered 2019-10-14 – 2019-11-09 (×53): 1 [drp] via OPHTHALMIC
  Filled 2019-10-14 (×2): qty 5

## 2019-10-14 MED ORDER — LIDOCAINE 2% (20 MG/ML) 5 ML SYRINGE
INTRAMUSCULAR | Status: DC | PRN
  Administered 2019-10-14: 20 mg via INTRAVENOUS

## 2019-10-14 MED ORDER — BUPIVACAINE HCL (PF) 0.5 % IJ SOLN
INTRAMUSCULAR | Status: DC | PRN
  Administered 2019-10-14: 20 mL

## 2019-10-14 MED ORDER — DORZOLAMIDE HCL-TIMOLOL MAL 2-0.5 % OP SOLN
1.0000 [drp] | Freq: Two times a day (BID) | OPHTHALMIC | Status: DC
  Administered 2019-10-14 – 2019-11-09 (×53): 1 [drp] via OPHTHALMIC
  Filled 2019-10-14 (×2): qty 10

## 2019-10-14 MED ORDER — KETOROLAC TROMETHAMINE 15 MG/ML IJ SOLN
15.0000 mg | Freq: Once | INTRAMUSCULAR | Status: AC | PRN
  Administered 2019-10-14: 15 mg via INTRAVENOUS

## 2019-10-14 MED ORDER — ONDANSETRON HCL 4 MG/2ML IJ SOLN
INTRAMUSCULAR | Status: DC | PRN
  Administered 2019-10-14: 4 mg via INTRAVENOUS

## 2019-10-14 MED ORDER — LIDOCAINE 5 % EX OINT
1.0000 "application " | TOPICAL_OINTMENT | Freq: Two times a day (BID) | CUTANEOUS | Status: DC | PRN
  Filled 2019-10-14: qty 35.44

## 2019-10-14 MED ORDER — HYDRALAZINE HCL 20 MG/ML IJ SOLN
INTRAMUSCULAR | Status: AC
  Filled 2019-10-14: qty 1

## 2019-10-14 MED ORDER — DOXYCYCLINE HYCLATE 100 MG PO TABS: 100.0000 mg | ORAL_TABLET | Freq: Two times a day (BID) | ORAL | Status: DC

## 2019-10-14 MED ORDER — ACETAMINOPHEN 10 MG/ML IV SOLN
1000.0000 mg | Freq: Once | INTRAVENOUS | Status: DC | PRN
  Administered 2019-10-14: 1000 mg via INTRAVENOUS

## 2019-10-14 MED ORDER — VANCOMYCIN HCL 1000 MG IV SOLR
INTRAVENOUS | Status: AC
  Filled 2019-10-14: qty 1000

## 2019-10-14 MED ORDER — PROPOFOL 500 MG/50ML IV EMUL
INTRAVENOUS | Status: DC | PRN
  Administered 2019-10-14: 75 ug/kg/min via INTRAVENOUS

## 2019-10-14 MED ORDER — BUPIVACAINE HCL (PF) 0.5 % IJ SOLN
INTRAMUSCULAR | Status: AC
  Filled 2019-10-14: qty 30

## 2019-10-14 MED ORDER — GABAPENTIN 300 MG PO CAPS: 300.0000 mg | ORAL_CAPSULE | ORAL | Status: DC

## 2019-10-14 MED ORDER — CHLORHEXIDINE GLUCONATE 0.12 % MT SOLN: 15.0000 mL | Freq: Once | OROMUCOSAL | Status: AC

## 2019-10-14 MED ORDER — PIPERACILLIN-TAZOBACTAM 3.375 G IVPB 30 MIN
3.3750 g | Freq: Once | INTRAVENOUS | Status: AC
  Administered 2019-10-14: 3.375 g via INTRAVENOUS
  Filled 2019-10-14: qty 50

## 2019-10-14 MED ORDER — 0.9 % SODIUM CHLORIDE (POUR BTL) OPTIME
TOPICAL | Status: DC | PRN
  Administered 2019-10-14: 1000 mL

## 2019-10-14 MED ORDER — KETOROLAC TROMETHAMINE 30 MG/ML IJ SOLN
INTRAMUSCULAR | Status: AC
  Filled 2019-10-14: qty 1

## 2019-10-14 MED ORDER — FERROUS SULFATE 325 (65 FE) MG PO TABS
325.0000 mg | ORAL_TABLET | Freq: Every day | ORAL | Status: DC
  Administered 2019-10-15 – 2019-11-09 (×25): 325 mg via ORAL
  Filled 2019-10-14 (×25): qty 1

## 2019-10-14 MED ORDER — DEXAMETHASONE SODIUM PHOSPHATE 10 MG/ML IJ SOLN
INTRAMUSCULAR | Status: DC | PRN
  Administered 2019-10-14: 4 mg via INTRAVENOUS

## 2019-10-14 MED ORDER — PIPERACILLIN-TAZOBACTAM 3.375 G IVPB
3.3750 g | Freq: Three times a day (TID) | INTRAVENOUS | Status: DC
  Administered 2019-10-14 – 2019-10-16 (×6): 3.375 g via INTRAVENOUS
  Filled 2019-10-14 (×6): qty 50

## 2019-10-14 MED ORDER — HYDRALAZINE HCL 20 MG/ML IJ SOLN
5.0000 mg | Freq: Once | INTRAMUSCULAR | Status: AC
  Administered 2019-10-14: 5 mg via INTRAVENOUS

## 2019-10-14 MED ORDER — FENTANYL CITRATE (PF) 100 MCG/2ML IJ SOLN: 25.0000 ug | INTRAMUSCULAR | Status: DC | PRN

## 2019-10-14 MED ORDER — GABAPENTIN 300 MG PO CAPS
600.0000 mg | ORAL_CAPSULE | Freq: Every day | ORAL | Status: DC
  Administered 2019-10-14 – 2019-11-09 (×27): 600 mg via ORAL
  Filled 2019-10-14 (×10): qty 2
  Filled 2019-10-14: qty 6
  Filled 2019-10-14 (×16): qty 2

## 2019-10-14 MED ORDER — OMEGA-3-ACID ETHYL ESTERS 1 G PO CAPS
1.0000 g | ORAL_CAPSULE | Freq: Every day | ORAL | Status: DC
  Administered 2019-10-15 – 2019-11-09 (×25): 1 g via ORAL
  Filled 2019-10-14 (×25): qty 1

## 2019-10-14 MED ORDER — VANCOMYCIN HCL 750 MG/150ML IV SOLN
750.0000 mg | Freq: Two times a day (BID) | INTRAVENOUS | Status: DC
  Administered 2019-10-15: 750 mg via INTRAVENOUS
  Filled 2019-10-14: qty 150

## 2019-10-14 MED ORDER — ACETAMINOPHEN 10 MG/ML IV SOLN
INTRAVENOUS | Status: AC
  Filled 2019-10-14: qty 100

## 2019-10-14 MED ORDER — ASPIRIN EC 81 MG PO TBEC
81.0000 mg | DELAYED_RELEASE_TABLET | Freq: Every day | ORAL | Status: DC
  Administered 2019-10-15 – 2019-11-09 (×25): 81 mg via ORAL
  Filled 2019-10-14 (×25): qty 1

## 2019-10-14 MED ORDER — MULTIVITAMINS PO CAPS: 1.0000 | ORAL_CAPSULE | Freq: Every day | ORAL | Status: DC

## 2019-10-14 MED ORDER — SODIUM CHLORIDE 0.9 % IR SOLN
Status: DC | PRN
  Administered 2019-10-14 (×2): 3000 mL

## 2019-10-14 MED ORDER — GABAPENTIN 300 MG PO CAPS
300.0000 mg | ORAL_CAPSULE | Freq: Every morning | ORAL | Status: DC
  Administered 2019-10-15 – 2019-11-09 (×25): 300 mg via ORAL
  Filled 2019-10-14 (×25): qty 1

## 2019-10-14 MED ORDER — FENTANYL CITRATE (PF) 250 MCG/5ML IJ SOLN
INTRAMUSCULAR | Status: AC
  Filled 2019-10-14: qty 5

## 2019-10-14 MED ORDER — IBUPROFEN 200 MG PO TABS
600.0000 mg | ORAL_TABLET | Freq: Four times a day (QID) | ORAL | Status: DC | PRN
  Administered 2019-10-14 – 2019-10-15 (×2): 600 mg via ORAL
  Administered 2019-10-15 – 2019-10-22 (×4): 800 mg via ORAL
  Administered 2019-10-22: 600 mg via ORAL
  Administered 2019-10-23 – 2019-11-05 (×3): 800 mg via ORAL
  Administered 2019-11-09: 600 mg via ORAL
  Filled 2019-10-14 (×2): qty 3
  Filled 2019-10-14 (×5): qty 4
  Filled 2019-10-14: qty 3
  Filled 2019-10-14 (×2): qty 4
  Filled 2019-10-14: qty 3

## 2019-10-14 MED ORDER — MIDAZOLAM HCL 2 MG/2ML IJ SOLN
INTRAMUSCULAR | Status: DC | PRN
  Administered 2019-10-14 (×2): 1 mg via INTRAVENOUS

## 2019-10-14 MED ORDER — ONDANSETRON HCL 4 MG PO TABS
4.0000 mg | ORAL_TABLET | Freq: Three times a day (TID) | ORAL | Status: DC | PRN
  Administered 2019-10-15 – 2019-10-23 (×2): 4 mg via ORAL
  Filled 2019-10-14 (×2): qty 1

## 2019-10-14 MED ORDER — KETOROLAC TROMETHAMINE 15 MG/ML IJ SOLN
INTRAMUSCULAR | Status: AC
  Filled 2019-10-14: qty 1

## 2019-10-14 MED ORDER — VANCOMYCIN HCL 1000 MG IV SOLR
INTRAVENOUS | Status: DC | PRN
  Administered 2019-10-14: 1000 mg via TOPICAL

## 2019-10-14 MED ORDER — ENOXAPARIN SODIUM 40 MG/0.4ML ~~LOC~~ SOLN
40.0000 mg | SUBCUTANEOUS | Status: DC
  Administered 2019-10-15 – 2019-11-09 (×25): 40 mg via SUBCUTANEOUS
  Filled 2019-10-14 (×27): qty 0.4

## 2019-10-14 MED ORDER — MIDAZOLAM HCL 2 MG/2ML IJ SOLN
INTRAMUSCULAR | Status: AC
  Filled 2019-10-14: qty 2

## 2019-10-14 MED ORDER — VANCOMYCIN HCL 1500 MG/300ML IV SOLN
1500.0000 mg | Freq: Once | INTRAVENOUS | Status: AC
  Administered 2019-10-14: 1500 mg via INTRAVENOUS
  Filled 2019-10-14: qty 300

## 2019-10-14 MED ORDER — PROPOFOL 10 MG/ML IV BOLUS
INTRAVENOUS | Status: AC
  Filled 2019-10-14: qty 20

## 2019-10-14 MED ORDER — HYDROCHLOROTHIAZIDE 25 MG PO TABS
25.0000 mg | ORAL_TABLET | Freq: Every day | ORAL | Status: DC
  Administered 2019-10-15: 25 mg via ORAL
  Filled 2019-10-14: qty 1

## 2019-10-14 MED ORDER — ORAL CARE MOUTH RINSE
15.0000 mL | Freq: Once | OROMUCOSAL | Status: AC
  Administered 2019-10-14: 15 mL via OROMUCOSAL

## 2019-10-14 MED ORDER — LATANOPROST 0.005 % OP SOLN
1.0000 [drp] | Freq: Every day | OPHTHALMIC | Status: DC
  Administered 2019-10-14 – 2019-11-08 (×26): 1 [drp] via OPHTHALMIC
  Filled 2019-10-14 (×2): qty 2.5

## 2019-10-14 MED ORDER — ONDANSETRON HCL 4 MG/2ML IJ SOLN: 4.0000 mg | Freq: Once | INTRAMUSCULAR | Status: DC | PRN

## 2019-10-14 MED ORDER — PROPOFOL 10 MG/ML IV BOLUS
INTRAVENOUS | Status: DC | PRN
  Administered 2019-10-14 (×4): 20 mg via INTRAVENOUS

## 2019-10-14 MED ORDER — FENTANYL CITRATE (PF) 250 MCG/5ML IJ SOLN
INTRAMUSCULAR | Status: DC | PRN
  Administered 2019-10-14 (×5): 25 ug via INTRAVENOUS

## 2019-10-14 MED ORDER — ADULT MULTIVITAMIN W/MINERALS CH
1.0000 | ORAL_TABLET | Freq: Every day | ORAL | Status: DC
  Administered 2019-10-15 – 2019-11-09 (×25): 1 via ORAL
  Filled 2019-10-14 (×25): qty 1

## 2019-10-14 MED ORDER — LACTATED RINGERS IV SOLN: INTRAVENOUS | Status: DC

## 2019-10-14 MED ORDER — HYDROMORPHONE HCL 1 MG/ML IJ SOLN
0.5000 mg | INTRAMUSCULAR | Status: DC | PRN
  Administered 2019-10-14 – 2019-10-20 (×10): 0.5 mg via INTRAVENOUS
  Filled 2019-10-14 (×11): qty 1

## 2019-10-14 MED ORDER — CEFAZOLIN SODIUM-DEXTROSE 1-4 GM/50ML-% IV SOLN
1.0000 g | Freq: Three times a day (TID) | INTRAVENOUS | Status: DC
  Filled 2019-10-14: qty 50

## 2019-10-14 SURGICAL SUPPLY — 37 items
BNDG ELASTIC 4X5.8 VLCR STR LF (GAUZE/BANDAGES/DRESSINGS) ×3 IMPLANT
BNDG GAUZE ELAST 4 BULKY (GAUZE/BANDAGES/DRESSINGS) ×3 IMPLANT
CNTNR URN SCR LID CUP LEK RST (MISCELLANEOUS) ×2 IMPLANT
CONT SPEC 4OZ STRL OR WHT (MISCELLANEOUS) ×6
COVER SURGICAL LIGHT HANDLE (MISCELLANEOUS) ×3 IMPLANT
CUFF TOURN SGL QUICK 18X4 (TOURNIQUET CUFF) IMPLANT
DRAPE U-SHAPE 47X51 STRL (DRAPES) ×3 IMPLANT
ELECT CAUTERY BLADE 6.4 (BLADE) ×3 IMPLANT
ELECT REM PT RETURN 9FT ADLT (ELECTROSURGICAL) ×3
ELECTRODE REM PT RTRN 9FT ADLT (ELECTROSURGICAL) ×1 IMPLANT
GAUZE PACKING IODOFORM 1/4X15 (PACKING) ×3 IMPLANT
GAUZE SPONGE 4X4 12PLY STRL (GAUZE/BANDAGES/DRESSINGS) ×3 IMPLANT
GAUZE XEROFORM 5X9 LF (GAUZE/BANDAGES/DRESSINGS) ×3 IMPLANT
GLOVE BIO SURGEON STRL SZ8 (GLOVE) ×3 IMPLANT
GLOVE BIOGEL PI IND STRL 8 (GLOVE) ×1 IMPLANT
GLOVE BIOGEL PI INDICATOR 8 (GLOVE) ×2
GOWN STRL REUS W/ TWL LRG LVL3 (GOWN DISPOSABLE) ×1 IMPLANT
GOWN STRL REUS W/ TWL XL LVL3 (GOWN DISPOSABLE) ×1 IMPLANT
GOWN STRL REUS W/TWL LRG LVL3 (GOWN DISPOSABLE) ×3
GOWN STRL REUS W/TWL XL LVL3 (GOWN DISPOSABLE) ×3
KIT BASIN OR (CUSTOM PROCEDURE TRAY) ×3 IMPLANT
KIT TURNOVER KIT B (KITS) ×3 IMPLANT
MANIFOLD NEPTUNE II (INSTRUMENTS) ×3 IMPLANT
NEEDLE HYPO 25GX1X1/2 BEV (NEEDLE) ×3 IMPLANT
NS IRRIG 1000ML POUR BTL (IV SOLUTION) ×3 IMPLANT
PACK ORTHO EXTREMITY (CUSTOM PROCEDURE TRAY) ×3 IMPLANT
PAD ARMBOARD 7.5X6 YLW CONV (MISCELLANEOUS) ×6 IMPLANT
SOL PREP POV-IOD 4OZ 10% (MISCELLANEOUS) ×6 IMPLANT
SUT ETHILON 3 0 PS 1 (SUTURE) ×6 IMPLANT
SWAB COLLECTION DEVICE MRSA (MISCELLANEOUS) ×6 IMPLANT
SWAB CULTURE ESWAB REG 1ML (MISCELLANEOUS) ×6 IMPLANT
SYR CONTROL 10ML LL (SYRINGE) ×3 IMPLANT
TOWEL GREEN STERILE (TOWEL DISPOSABLE) ×3 IMPLANT
TOWEL GREEN STERILE FF (TOWEL DISPOSABLE) ×3 IMPLANT
TUBE CONNECTING 12'X1/4 (SUCTIONS) ×1
TUBE CONNECTING 12X1/4 (SUCTIONS) ×2 IMPLANT
YANKAUER SUCT BULB TIP NO VENT (SUCTIONS) ×3 IMPLANT

## 2019-10-14 NOTE — Anesthesia Preprocedure Evaluation (Addendum)
Anesthesia Evaluation  Patient identified by MRN, date of birth, ID band Patient awake    Reviewed: Allergy & Precautions, NPO status , Patient's Chart, lab work & pertinent test results  Airway Mallampati: II  TM Distance: >3 FB Neck ROM: Full    Dental  (+) Edentulous Upper, Edentulous Lower   Pulmonary neg pulmonary ROS,    Pulmonary exam normal breath sounds clear to auscultation       Cardiovascular hypertension, Pt. on medications Normal cardiovascular exam Rhythm:Regular Rate:Normal     Neuro/Psych CVA, No Residual Symptoms negative psych ROS   GI/Hepatic negative GI ROS, Neg liver ROS,   Endo/Other  negative endocrine ROS  Renal/GU negative Renal ROS     Musculoskeletal  (+) Arthritis ,   Abdominal (+) + obese,   Peds  Hematology negative hematology ROS (+)   Anesthesia Other Findings Abscess  Reproductive/Obstetrics                           Anesthesia Physical Anesthesia Plan  ASA: II  Anesthesia Plan: MAC   Post-op Pain Management:    Induction: Intravenous  PONV Risk Score and Plan: Ondansetron, Dexamethasone, Propofol infusion, Treatment may vary due to age or medical condition and Midazolam  Airway Management Planned: Simple Face Mask  Additional Equipment:   Intra-op Plan:   Post-operative Plan:   Informed Consent: I have reviewed the patients History and Physical, chart, labs and discussed the procedure including the risks, benefits and alternatives for the proposed anesthesia with the patient or authorized representative who has indicated his/her understanding and acceptance.       Plan Discussed with: CRNA  Anesthesia Plan Comments:       Anesthesia Quick Evaluation

## 2019-10-14 NOTE — Plan of Care (Signed)

## 2019-10-14 NOTE — Consult Note (Signed)
Reason for Consult: Left foot infection Referring Physician: Trey Bebee Walstad is an 64 y.o. female.  HPI: 64 year old female sent to ED by this provider for worsening foot pain and finding maggots to her foot wound. She was seen by Dr. Milinda Pointer last week. She had tripped and caused her foot to start bleeding and was told to not get her foot wet. She states that the stitches started to burst and she was having increased drainage and pain. Found maggots in her wound and was concerned. Reports recent fevers. Denies N/Ch.  Past Medical History:  Diagnosis Date  . Abnormal ECG 03/13/2013   TWI diffuse. Was seen prior to cath in 2007. No CAD.  Marland Kitchen Atypical chest pain 03/13/2013  . Hypertension     Past Surgical History:  Procedure Laterality Date  . FOOT SURGERY      Family History  Problem Relation Age of Onset  . Hypertension Mother     Social History:  reports that she has never smoked. She has never used smokeless tobacco. She reports that she does not drink alcohol and does not use drugs.  Allergies: No Known Allergies  Medications: I have reviewed the patient's current medications.  Results for orders placed or performed during the hospital encounter of 10/13/19 (from the past 48 hour(s))  Basic metabolic panel     Status: Abnormal   Collection Time: 10/14/19  4:37 AM  Result Value Ref Range   Sodium 137 135 - 145 mmol/L   Potassium 4.0 3.5 - 5.1 mmol/L   Chloride 100 98 - 111 mmol/L   CO2 28 22 - 32 mmol/L   Glucose, Bld 102 (H) 70 - 99 mg/dL    Comment: Glucose reference range applies only to samples taken after fasting for at least 8 hours.   BUN 16 8 - 23 mg/dL   Creatinine, Ser 0.80 0.44 - 1.00 mg/dL   Calcium 9.4 8.9 - 10.3 mg/dL   GFR calc non Af Amer >60 >60 mL/min   GFR calc Af Amer >60 >60 mL/min   Anion gap 9 5 - 15    Comment: Performed at [facility] [address] [place], [place] [address]  CBC with Differential/Platelet     Status: Abnormal    Collection Time: 10/14/19  4:37 AM  Result Value Ref Range   WBC 11.6 (H) 4.0 - 10.5 K/uL   RBC 3.96 3.87 - 5.11 MIL/uL   Hemoglobin 11.9 (L) 12.0 - 15.0 g/dL   HCT 36.5 36 - 46 %   MCV 92.2 80.0 - 100.0 fL   MCH 30.1 26.0 - 34.0 pg   MCHC 32.6 30.0 - 36.0 g/dL   RDW 12.0 11.5 - 15.5 %   Platelets 439 (H) 150 - 400 K/uL   nRBC 0.0 0.0 - 0.2 %   Neutrophils Relative % 72 %   Neutro Abs 8.4 (H) 1.7 - 7.7 K/uL   Lymphocytes Relative 16 %   Lymphs Abs 1.8 0.7 - 4.0 K/uL   Monocytes Relative 8 %   Monocytes Absolute 1.0 0 - 1 K/uL   Eosinophils Relative 2 %   Eosinophils Absolute 0.2 0 - 0 K/uL   Basophils Relative 1 %   Basophils Absolute 0.1 0 - 0 K/uL   Immature Granulocytes 1 %   Abs Immature Granulocytes 0.08 (H) 0.00 - 0.07 K/uL    Comment: Performed at [facility] [address], [place], [place] [address]  SARS  Coronavirus 2 by RT PCR (hospital order, performed in [facility] hospital lab) Nasopharyngeal Nasopharyngeal Swab     Status: None   Collection Time: 10/14/19  4:46 AM   Specimen: Nasopharyngeal Swab  Result Value Ref Range   SARS Coronavirus 2 NEGATIVE NEGATIVE    Comment: (NOTE) SARS-CoV-2 target nucleic acids are NOT DETECTED.  The SARS-CoV-2 RNA is generally detectable in upper and lower respiratory specimens during the acute phase of infection. The lowest concentration of SARS-CoV-2 viral copies this assay can detect is 250 copies / mL. A negative result does not preclude SARS-CoV-2 infection and should not be used as the sole basis for treatment or other patient management decisions.  A negative result may occur with improper specimen collection / handling, submission of specimen other than nasopharyngeal swab, presence of viral mutation(s) within the areas targeted by this assay, and inadequate number of viral copies (<250 copies / mL). A negative result must be combined with clinical observations, patient history, and epidemiological  information.  Fact Sheet for Patients:   StrictlyIdeas.no  Fact Sheet for Healthcare Providers: BankingDealers.co.za  This test is not yet approved or  cleared by the [place] FDA and has been authorized for detection and/or diagnosis of SARS-CoV-2 by FDA under an Emergency Use Authorization (EUA).  This EUA will remain in effect (meaning this test can be used) for the duration of the COVID-19 declaration under Section 564(b)(1) of the Act, 21 U.S.C. section 360bbb-3(b)(1), unless the authorization is terminated or revoked sooner.  Performed at [facility], [place] [address] [place], [place] [address]     DG Foot 2 Views Left  Result Date: 10/14/2019 CLINICAL DATA:  65 year old female with history of left foot pain. EXAM: LEFT FOOT - 2 VIEW COMPARISON:  Left foot radiograph 10/11/2019. FINDINGS: Postoperative changes of arthroplasty at the first MTP joint are again noted. No periprosthetic fracture. Extensive soft tissue swelling is noted overlying the forefoot, particularly dorsally. There is no evidence of fracture or dislocation. There is no evidence of arthropathy or other focal bone abnormality. Soft tissues are unremarkable. IMPRESSION: 1. Extensive soft tissue swelling overlying the forefoot in this patient status post first MTP joint arthroplasty. Electronically Signed   By: Vinnie Langton M.D.   On: 10/14/2019 05:22    ROS Blood pressure (!) 147/81, pulse (!) 57, temperature 98.4 F (36.9 C), temperature source Oral, resp. rate 18, height 5\' 3"  (1.6 m), weight 82.1 kg, SpO2 99 %.  Vitals:   10/14/19 0530 10/14/19 0715  BP: 137/69 (!) 147/81  Pulse: 60 (!) 57  Resp: 17 18  Temp:    SpO2: 98% 99%    General AA&O x3. Normal mood and affect.  Vascular Dorsalis pedis and posterior tibial pulses  present 1+ left  Capillary refill normal to all digits. Pedal hair growth diminished.  Neurologic Epicritic sensation  grossly present.  Dermatologic (Wound) Warmth erythema left forefoot. Sutures taut with deep desquamation, maceration, fluctuance, no crepitus. Warmth present entire forefoot. Pain with extension of digits.  Orthopedic: Motor intact LLE.    Assessment/Plan:  Left foot abscess, concern for possible osteomyelitis -Imaging: Studies independently reviewed. There are changes that could be post-operative but I am also concerned about the possibility of osteomyelitis. -Antibiotics: Continue empiric therapy. Will obtain cultures intra-op. -WB Status: Heel WB in CAM boot -Wound Care: Await further instructions after surgery -We did discuss surgery today for care of the infection. We did discuss the severity of the infection, the possibility that  the implant needs to be removed, and the risk she may lose her toe from this infection. She verbalized understanding. She wishes to proceed with surgery to salvage the infection. We will proceed today as such. No guarantees were made. -Surgical Plan: Incision and drainage of left foot abscess(es) with debridement of all non-viable soft tissue and bone, possible removal of implant, possible insertion of antibiotic spacer.   Evelina Bucy 10/14/2019, 9:50 AM   Best available via secure chat for questions or concerns.

## 2019-10-14 NOTE — Op Note (Signed)
Patient Name: Alexis Ball DOB: November 10, 1955  MRN: [id number]   Date of Service: 10/14/19  Surgeon: Dr. Hardie Pulley, DPM Assistants: None Pre-operative Diagnosis:  Abscess foot, osteomyelitis Post-operative Diagnosis:  * No Diagnosis Codes entered * Procedures:  1) Incision and drainage for foot infection, complex  2) Bone Biopsy proximal phalanx Pathology/Specimens: ID Type Source Tests Collected by Time Destination  1 : Bone of Proximal Phalanx  Tissue Bone SURGICAL PATHOLOGY Alexis Ball, DPM 10/14/2019 1447   A : Second Interspace Left Foot  Body Fluid Soft Tissue, Other GRAM STAIN, AEROBIC/ANAEROBIC CULTURE (SURGICAL/DEEP WOUND) Alexis Ball, DPM 10/14/2019 1437   B : 1st Metatarsal  Body Fluid Soft Tissue, Other GRAM STAIN, AEROBIC/ANAEROBIC CULTURE (SURGICAL/DEEP WOUND) Alexis Ball, DPM 10/14/2019 1442   C : Bone of Proximal Phalanx  Tissue Other Source GRAM STAIN, AEROBIC/ANAEROBIC CULTURE (SURGICAL/DEEP WOUND) Alexis Ball, DPM 10/14/2019 1443    Anesthesia: MAC/local Hemostasis: Anatomic Estimated Blood Loss: 10 ml Materials: * No implants in log * Medications: 1g Vancomycin powder Complications: None  Indications for Procedure:  This is a 64 y.o. female who underwent Keller implant arthroplasty and excision of mass from the second third MPJ area on 10/05/2019.  During her postoperative course she injured her foot causing stress to the sutures.  She was given antibiotic by Dr. Milinda Pointer but the wound continued to worsen and she had maggots in her wound.  She presented to the ED and was admitted.  MRI was performed and showed deep abscess with osteomyelitis versus post surgical changes of the first metatarsophalangeal joint.  Discussed would benefit from incision and drainage of the abscess for resolution of the infection.  It was discussed that dependent upon findings intraoperatively she may need removal of the implant.  No guarantees were made as to outcomes.    Procedure in Detail: Patient was identified in pre-operative holding area. Formal consent was signed and the left lower extremity was marked. Patient was brought back to the operating room. Anesthesia was induced. The extremity was prepped and draped in the usual sterile fashion. Timeout was taken to confirm patient name, laterality, and procedure prior to incision.   Attention was then directed to the left foot.  There was a fluctuant taut area over the second/third MPJ.  The sutures were removed and the area was incised.  Purulence was encountered and collected for culture with a swab.  The area was further incised, tissues were spread and there was deep purulent abscess collection which was thoroughly evacuated.  There was local necrosis of the tissues.  The abscess was found to communicate medially to the first metatarsal area.  Attention was then directed to the first metatarsal.  There was visible purulence draining through the sutures.  The sutures removed and the tissues were gently spread.  There were superficial purulence that communicated from the medial aspect of the first toe medial to the extensor tendon to the aforementioned abscessed area of the second third MPJ; this was collected for culture.  This purulence was not found to extend deep.  The capsular layer was distended and capsular suture was completely ruptured.  The silicone implant was immediately visible as well as the grommet in the proximal phalanx bone and a portion of the proximal phalanx bone was visible as well.  The metatarsal side of the joint appeared to be covered with periosteal tissues.  A rongeur was used to biopsy the proximal phalanx for both microbiology and pathology.  The area of  the first metatarsal was then thoroughly copiously irrigated with 3 L of normal saline via pulse lavage. The area of the second third metatarsal was then additionally copiously irrigated with 3 L of normal saline via pulse lavage.  Each  wound was then excisionally debrided sharply with a clean rongeur of all nonviable soft tissue.  Skin margins were debrided of wound fibrosis. The wounds were further irrigated.  Each incision was then loosely approximated with 3-0 nylon.  Quarter inch iodoform packing was packed into each wound.  The foot was then dressed with Xeroform 4 x 4 Kerlix and Ace bandage. Patient tolerated the procedure well.  Disposition: Following a period of post-operative monitoring, patient will be transferred back to the floor.  I did not remove the implant today, but do not preclude that she will need removal of the implant in the future - merely that findings today did not seem to indicate that the implant was the source of the infection and thus removal of the implant was necessary for control of the infection. Intraoperative findings today show that the worst of the infection was at the second/third MPJ and appeared to be spreading to the first metatarsal. Though there was purulence at the first metatarsal incision and the hardware was exposed, it did not extend deep to this area.  Should the cultures of the bone suggest infection at this area, she may require removal of the implant.  We may need to discuss this with infectious disease once cultures return.   In either case she will need return to the operating room at an undetermined date for either delayed closure with or without VAC therapy for enhanced wound healing.

## 2019-10-14 NOTE — H&P (Signed)
History and Physical    Alexis Ball NFA:[id number] DOB: 20-Jun-1955 DOA: 10/13/2019  PCP: Lucianne Lei, MD (Confirm with patient/family/NH records and if not entered, this has to be entered at [facility] point of entry) Patient coming from: Home  I have personally briefly reviewed patient's old medical records in [place]  Chief Complaint: Foot hurts  HPI: Alexis Ball is a 64 y.o. female with medical history significant of HTN, remote CVA, and recent bunionectomy with silicone implant of her left foot on June 25.  Patient started to have inncreasing pain and drainage from the surgical wound about.  She reports she tripped and fell down on the foot last week with some small oozy bleeding and stretch on the wound.  About 5 to 6 days ago, the forefoot started to swell.  He went to podiatrist office 3 days ago, x-ray showed no significant displacement and of the implant and MTP joints, but there was a local infection going on, podiatrist ordered clindamycin plus Keflex regimen which she has been taking.  But today, she noticed that the wound is "busted open" with maggots coming out.She reports fevers.  ED Course: WBC 11.6, platelet 439, BUN 16 creatinine 0.8.  MRI showing cellulitis-like changes with underneath tissue gas.  Review of Systems: As per HPI otherwise 10 point review of systems negative.    Past Medical History:  Diagnosis Date   Abnormal ECG 03/13/2013   TWI diffuse. Was seen prior to cath in 2007. No CAD.   Atypical chest pain 03/13/2013   Hypertension    Stroke [facility])     Past Surgical History:  Procedure Laterality Date   FOOT SURGERY       reports that she has never smoked. She has never used smokeless tobacco. She reports that she does not drink alcohol and does not use drugs.  No Known Allergies  Family History  Problem Relation Age of Onset   Hypertension Mother      Prior to Admission medications   Medication Sig Start Date End Date Taking?  Authorizing Provider  aspirin EC 81 MG tablet Take 81 mg by mouth daily.   Yes [provider]  bimatoprost (LUMIGAN) 0.01 % SOLN Place 1 drop into both eyes at bedtime.    Yes [provider]  brimonidine (ALPHAGAN) 0.15 % ophthalmic solution Place 1 drop into both eyes 2 (two) times daily.    Yes [provider]  dorzolamide-timolol (COSOPT) 22.3-6.8 MG/ML ophthalmic solution Place 1 drop into both eyes 2 (two) times daily.  05/14/13  Yes [provider]  ferrous sulfate 325 (65 FE) MG tablet Take 325 mg by mouth daily with breakfast.   Yes [provider]  gabapentin (NEURONTIN) 300 MG capsule Take 300-600 mg by mouth See admin instructions. Take one capsule (300mg ) in the morning and 2 capsules (600mg ) at bedtime. 03/25/18  Yes [provider]  hydrochlorothiazide (HYDRODIURIL) 25 MG tablet Take 25 mg by mouth daily.   Yes [provider]  ibuprofen (ADVIL,MOTRIN) 200 MG tablet Take 600-800 mg by mouth every 6 (six) hours as needed for headache (pain).   Yes [provider]  lidocaine (XYLOCAINE) 5 % ointment Apply 1 application topically 2 (two) times daily as needed for mild pain.    Yes [provider]  Multiple Vitamin (MULTIVITAMIN) capsule Take 1 capsule by mouth daily.   Yes [provider]  Omega-3 Fatty Acids (FISH OIL) 1000 MG CAPS Take 1,000 mg by mouth daily.  Yes [provider]  ondansetron (ZOFRAN) 4 MG tablet Take 1 tablet (4 mg total) by mouth every 8 (eight) hours as needed. Patient taking differently: Take 4 mg by mouth every 8 (eight) hours as needed for nausea or vomiting.  10/03/19  Yes Hyatt, Max T, DPM  oxyCODONE-acetaminophen (PERCOCET) 10-325 MG tablet Take 1 tablet by mouth every 8 (eight) hours as needed for up to 7 days for pain. 10/11/19 10/18/19 Yes Hyatt, Max T, DPM  polyethylene glycol (MIRALAX) 17 g packet Take 17 g by mouth daily as needed for moderate constipation or  severe constipation. 03/02/19  Yes Jaynee Eagles, PA-C  Polyvinyl Alcohol-Povidone (REFRESH OP) Place 1 drop into both eyes 3 (three) times daily as needed (dry eyes).    Yes [provider]  potassium chloride SA (K-DUR,KLOR-CON) 20 MEQ tablet Take 20 mEq by mouth daily.   Yes [provider]  cephALEXin (KEFLEX) 500 MG capsule Take 1 capsule (500 mg total) by mouth 3 (three) times daily. Patient not taking: Reported on 10/14/2019 10/03/19   Hyatt, Max T, DPM  clindamycin (CLEOCIN) 150 MG capsule Take 1 capsule (150 mg total) by mouth 3 (three) times daily. Patient not taking: Reported on 10/14/2019 10/11/19   Tyson Dense T, DPM  meloxicam (MOBIC) 7.5 MG tablet Take 1 tablet (7.5 mg total) by mouth daily. Patient not taking: Reported on 10/14/2019 03/16/19   Marybelle Killings, MD  triamcinolone cream (KENALOG) 0.1 % Apply 1 application topically 2 (two) times daily. Patient not taking: Reported on 10/14/2019 02/01/19   Raylene Everts, MD    Physical Exam: Vitals:   10/14/19 1525 10/14/19 1530 10/14/19 1535 10/14/19 1547  BP:   (!) 143/75 129/83  Pulse: 64 64 62 79  Resp: 18 18 18 16   Temp:   97.8 F (36.6 C) 97.9 F (36.6 C)  TempSrc:    Oral  SpO2: 100% 91% 100%   Weight:      Height:        Constitutional: NAD, calm, comfortable Vitals:   10/14/19 1525 10/14/19 1530 10/14/19 1535 10/14/19 1547  BP:   (!) 143/75 129/83  Pulse: 64 64 62 79  Resp: 18 18 18 16   Temp:   97.8 F (36.6 C) 97.9 F (36.6 C)  TempSrc:    Oral  SpO2: 100% 91% 100%   Weight:      Height:       Eyes: PERRL, lids and conjunctivae normal ENMT: Mucous membranes are moist. Posterior pharynx clear of any exudate or lesions.Normal dentition.  Neck: normal, supple, no masses, no thyromegaly Respiratory: clear to auscultation bilaterally, no wheezing, no crackles. Normal respiratory effort. No accessory muscle use.  Cardiovascular: Regular rate and rhythm, no murmurs / rubs / gallops. No extremity  edema. 2+ pedal pulses. No carotid bruits.  Abdomen: no tenderness, no masses palpated. No hepatosplenomegaly. Bowel sounds positive.  Musculoskeletal: no clubbing / cyanosis. No joint deformity upper and lower extremities. Good ROM, no contractures. Normal muscle tone.  Skin: Swelling and tenderness and increased local temperature of the left forefoot Neurologic: CN 2-12 grossly intact. Sensation intact, DTR normal. Strength 5/5 in all 4.  Psychiatric: Normal judgment and insight. Alert and oriented x 3. Normal mood.       Labs on Admission: I have personally reviewed following labs and imaging studies  CBC: Recent Labs  Lab 10/14/19 0437  WBC 11.6*  NEUTROABS 8.4*  HGB 11.9*  HCT 36.5  MCV 92.2  PLT 439*  Basic Metabolic Panel: Recent Labs  Lab 10/14/19 0437  NA 137  K 4.0  CL 100  CO2 28  GLUCOSE 102*  BUN 16  CREATININE 0.80  CALCIUM 9.4   GFR: Estimated Creatinine Clearance: 74 mL/min (by C-G formula based on SCr of 0.8 mg/dL). Liver Function Tests: No results for input(s): AST, ALT, ALKPHOS, BILITOT, PROT, ALBUMIN in the last 168 hours. No results for input(s): LIPASE, AMYLASE in the last 168 hours. No results for input(s): AMMONIA in the last 168 hours. Coagulation Profile: No results for input(s): INR, PROTIME in the last 168 hours. Cardiac Enzymes: No results for input(s): CKTOTAL, CKMB, CKMBINDEX, TROPONINI in the last 168 hours. BNP (last 3 results) No results for input(s): PROBNP in the last 8760 hours. HbA1C: No results for input(s): HGBA1C in the last 72 hours. CBG: No results for input(s): GLUCAP in the last 168 hours. Lipid Profile: No results for input(s): CHOL, HDL, LDLCALC, TRIG, CHOLHDL, LDLDIRECT in the last 72 hours. Thyroid Function Tests: No results for input(s): TSH, T4TOTAL, FREET4, T3FREE, THYROIDAB in the last 72 hours. Anemia Panel: No results for input(s): VITAMINB12, FOLATE, FERRITIN, TIBC, IRON, RETICCTPCT in the last 72  hours. Urine analysis:    Component Value Date/Time   COLORURINE AMBER (A) 03/07/2014 2251   APPEARANCEUR CLOUDY (A) 03/07/2014 2251   LABSPEC 1.025 03/02/2019 1243   PHURINE 7.0 03/02/2019 1243   GLUCOSEU NEGATIVE 03/02/2019 1243   HGBUR NEGATIVE 03/02/2019 1243   BILIRUBINUR NEGATIVE 03/02/2019 1243   KETONESUR NEGATIVE 03/02/2019 1243   PROTEINUR NEGATIVE 03/02/2019 1243   UROBILINOGEN 2.0 (H) 03/02/2019 1243   NITRITE NEGATIVE 03/02/2019 1243   LEUKOCYTESUR NEGATIVE 03/02/2019 1243    Radiological Exams on Admission: MR FOOT LEFT WO CONTRAST  Result Date: 10/14/2019 CLINICAL DATA:  Patient status post bunionectomy and placement of a silicone implant at the first MTP joint 10/05/2019. Pain and drainage at the surgical wound. EXAM: MRI OF THE LEFT FOOT WITHOUT CONTRAST TECHNIQUE: Multiplanar, multisequence MR imaging of the left foot was performed. No intravenous contrast was administered. COMPARISON:  Plain films left foot earlier today. FINDINGS: Bones/Joint/Cartilage Implant is in place in the first MTP joint and results in some artifact on the exam. There is marrow edema throughout the first metatarsal and great toe including the distal phalanx where no hardware is present. Edema is also seen in both the medial and lateral sesamoids. No joint effusion. Ligaments Visualization is limited about the first MTP joint but no obvious tear is seen. Muscles and Tendons No intramuscular fluid collection or mass. There is edema and plantar musculature deep to the first metatarsal. No intramuscular fluid collection. Soft tissues A skin wound on the dorsum of the foot at the level of the distal third metatarsal. And underlying heterogeneous fluid collection measures 1.6 cm transverse by 0.9 cm craniocaudal by approximately 3 cm long. Extensive subcutaneous edema is present diffusely. On the coronal T1 weighted images, there appears to be soft tissue gas over the dorsum of the foot near the level of the  tarsometatarsal joints. IMPRESSION: Skin wound on the dorsum of the foot at the level of the distal third metatarsal with an underlying fluid collection likely representing an abscess. Status post implant placement at the first MTP joint. Marrow edema throughout the first metatarsal and proximal phalanx of the great toe could be postoperative but may be due to osteomyelitis given the soft tissue wound and abscess. There is also marrow edema in both the medial and lateral sesamoids  in the distal phalanx of the great toe which may reflect postoperative change but is worrisome for osteomyelitis. Diffuse subcutaneous edema about the foot compatible with cellulitis. On coronal images, there appears to be soft tissue gas in the dorsum of the foot proximally. This is not seen on the prior plain films. Edema in the flexor hallucis muscle subjacent to the first metatarsal could be postoperative but may represent myositis. No intramuscular abscess. Electronically Signed   By: Inge Rise M.D.   On: 10/14/2019 12:20   DG Foot 2 Views Left  Result Date: 10/14/2019 CLINICAL DATA:  64 year old female with history of left foot pain. EXAM: LEFT FOOT - 2 VIEW COMPARISON:  Left foot radiograph 10/11/2019. FINDINGS: Postoperative changes of arthroplasty at the first MTP joint are again noted. No periprosthetic fracture. Extensive soft tissue swelling is noted overlying the forefoot, particularly dorsally. There is no evidence of fracture or dislocation. There is no evidence of arthropathy or other focal bone abnormality. Soft tissues are unremarkable. IMPRESSION: 1. Extensive soft tissue swelling overlying the forefoot in this patient status post first MTP joint arthroplasty. Electronically Signed   By: Vinnie Langton M.D.   On: 10/14/2019 05:22    EKG: Independently reviewed.   Assessment/Plan Active Problems:   Abscess of bursa of left foot   Foot abscess, left   Wound infection  (please populate well all  problems here in Problem List. (For example, if patient is on BP meds at home and you resume or decide to hold them, it is a problem that needs to be her. Same for CAD, COPD, HLD and so on)  Left forefoot abscess and cellulitis and question of osteomyelitis, status post I&D -Escalate antibiotic to vancomycin and Zosyn for now while waiting for culture -Wound care and weight bearing as per podiatrist  HTN -Trolled continue home regimen  History of CVA -No acute issue, continue aspirin  Chronic iron deficiency anemia -On iron supplement, outpatient GI follow-up  DVT prophylaxis: Lovenox Code Status: Full code Family Communication: None at bedside Disposition Plan: Depends on deep tissue culture result, may need IV antibiotics to go home, likely will need 2 to 3 days hospital stay Consults called: Podiatrist Admission status: MedSurg   Lequita Halt MD [facility] Pager [phone]  10/14/2019, 5:31 PM

## 2019-10-14 NOTE — Transfer of Care (Signed)
Immediate Anesthesia Transfer of Care Note  Patient: Alexis Ball  Procedure(s) Performed: INCISION AND DRAINAGE LEFT FOOT INFECTION (Left Foot) BONE BIOPSY PROXIMAL PHALANX (Left Foot)  Patient Location: PACU  Anesthesia Type:MAC  Level of Consciousness: drowsy and patient cooperative  Airway & Oxygen Therapy: Patient Spontanous Breathing and Patient connected to nasal cannula oxygen  Post-op Assessment: Report given to RN and Post -op Vital signs reviewed and stable  Post vital signs: Reviewed and stable  Last Vitals:  Vitals Value Taken Time  BP 184/168 10/14/19 1505  Temp    Pulse 58 10/14/19 1508  Resp 11 10/14/19 1508  SpO2 100 % 10/14/19 1508  Vitals shown include unvalidated device data.  Last Pain:  Vitals:   10/14/19 1010  TempSrc: Oral  PainSc: 10-Worst pain ever      Patients Stated Pain Goal: 2 (91/69/45 0388)  Complications: No complications documented.

## 2019-10-14 NOTE — Progress Notes (Signed)
Patient transported back to 5N, receiving RN at bedside, daughter of patient at bedside, VS stable, no questions or concerns, bed in lowest position, call bell in reach.  Rowe Pavy, RN

## 2019-10-14 NOTE — ED Notes (Addendum)
Pt's wound irrigated with NS, maggots flushed using same. Pt's L foot assessed after flushing, no maggots found. MD Christy Gentles made aware, to contact podiatry for consult.

## 2019-10-14 NOTE — Progress Notes (Signed)
  Pharmacy Antibiotic Note  Alexis Ball is a 64 y.o. female admitted on 10/13/2019 with foot abscess and osteomyelitis s/p complex I&D.  Pharmacy has been consulted for vancomycin and pip/tazo dosing.  WBC 11.6, afeb. First dose pip/tazo given pre-op. OR cultures pending.  Plan: Vancomycin 1500mg  x1 load then 750mg  Q12hr Pip/tazo 3.375g q8hr extended infusion Monitor cultures, clinical status, renal fx Narrow abx as able and f/u duration    Height: 5\' 3"  (160 cm) Weight: 82.1 kg (181 lb) IBW/kg (Calculated) : 52.4  Temp (24hrs), Avg:98 F (36.7 C), Min:97.8 F (36.6 C), Max:98.4 F (36.9 C)  Recent Labs  Lab 10/14/19 0437  WBC 11.6*  CREATININE 0.80    Estimated Creatinine Clearance: 74 mL/min (by C-G formula based on SCr of 0.8 mg/dL).    No Known Allergies  Antimicrobials this admission: Vanc 7/4 >>  Piptazo 7/4 >>   Microbiology results: 7/4 Tissue cx: pending 7/4 abscess cx: pending    Thank you for allowing pharmacy to be a part of this patient's care.  Benetta Spar, PharmD, BCPS, BCCP Clinical Pharmacist  Please check AMION for all [facility] phone numbers After 10:00 PM, call [place] [phone]

## 2019-10-14 NOTE — ED Provider Notes (Signed)
[facility] EMERGENCY DEPARTMENT Provider Note   CSN: [id number] Arrival date & time: 10/13/19  2319     History Chief Complaint  Patient presents with  . Wound Check    Alexis Ball is a 64 y.o. female.  The history is provided by the patient.  Wound Check This is a new problem. The current episode started 3 to 5 hours ago. The problem occurs constantly. The problem has been gradually worsening. Pertinent negatives include no chest pain. The symptoms are aggravated by walking. The symptoms are relieved by rest.   Patient underwent bunionectomy with silicone implant of her left foot.  This was on June 25 She reports over the past day or so she is had increasing pain and drainage from the wound.  She reports that the wound is "busted open" She also reports she is finding maggots in the wound   She reports fevers. Past Medical History:  Diagnosis Date  . Abnormal ECG 03/13/2013   TWI diffuse. Was seen prior to cath in 2007. No CAD.  Marland Kitchen Atypical chest pain 03/13/2013  . Hypertension     Patient Active Problem List   Diagnosis Date Noted  . Unilateral primary osteoarthritis, left knee 04/27/2019  . Chondromalacia patellae, left knee 07/02/2016  . Arthritis of left knee 02/25/2016  . Vaginitis and vulvovaginitis, unspecified 08/08/2013  . Leiomyoma of uterus, unspecified 08/08/2013  . Atypical chest pain 03/13/2013  . Abnormal ECG 03/13/2013  . Obesity, unspecified 03/13/2013  . Hypertension     Past Surgical History:  Procedure Laterality Date  . FOOT SURGERY       OB History    Gravida  3   Para  3   Term  3   Preterm      AB      Living  3     SAB      TAB      Ectopic      Multiple      Live Births  3           Family History  Problem Relation Age of Onset  . Hypertension Mother     Social History   Tobacco Use  . Smoking status: Never Smoker  . Smokeless tobacco: Never Used  Vaping Use  . Vaping Use: Never  used  Substance Use Topics  . Alcohol use: No  . Drug use: No    Home Medications Prior to Admission medications   Medication Sig Start Date End Date Taking? Authorizing Provider  aspirin EC 81 MG tablet Take 81 mg by mouth daily.    [provider]  bimatoprost (LUMIGAN) 0.01 % SOLN Place 1 drop into both eyes daily.    [provider]  brimonidine (ALPHAGAN) 0.15 % ophthalmic solution Place 1 drop into both eyes 2 (two) times daily.     [provider]  cephALEXin (KEFLEX) 500 MG capsule Take 1 capsule (500 mg total) by mouth 3 (three) times daily. 10/03/19   Hyatt, Max T, DPM  clindamycin (CLEOCIN) 150 MG capsule Take 1 capsule (150 mg total) by mouth 3 (three) times daily. 10/11/19   Hyatt, Max T, DPM  dorzolamide (TRUSOPT) 2 % ophthalmic solution INSTILL 1 DROP INTO BOTH EYES TWICE A DAY 12/09/18   [provider]  dorzolamide-timolol (COSOPT) 22.3-6.8 MG/ML ophthalmic solution Place 1 drop into both eyes 2 (two) times daily.  05/14/13   [provider]  ferrous sulfate 325 (65 FE) MG tablet  Take 325 mg by mouth daily with breakfast.    [provider]  gabapentin (NEURONTIN) 100 MG capsule  06/21/19   [provider]  gabapentin (NEURONTIN) 300 MG capsule TK 1 C PO IN THE MORNING AND 2 CS PO HS 03/25/18   [provider]  hydrochlorothiazide (HYDRODIURIL) 25 MG tablet Take 25 mg by mouth daily.    [provider]  ibuprofen (ADVIL,MOTRIN) 200 MG tablet Take 600-800 mg by mouth every 6 (six) hours as needed for headache (pain).    [provider]  lidocaine (XYLOCAINE) 5 % ointment Apply 1 application topically 2 (two) times daily.    [provider]  meloxicam (MOBIC) 7.5 MG tablet Take 1 tablet (7.5 mg total) by mouth daily. 03/16/19   Marybelle Killings, MD  montelukast (SINGULAIR) 10 MG tablet TK 1 T PO QHS 04/27/18   [provider]  ondansetron (ZOFRAN) 4 MG tablet Take 1 tablet (4 mg  total) by mouth every 8 (eight) hours as needed. 10/03/19   Hyatt, Max T, DPM  oxyCODONE-acetaminophen (PERCOCET) 10-325 MG tablet Take 1 tablet by mouth every 8 (eight) hours as needed for up to 7 days for pain. 10/11/19 10/18/19  Hyatt, Max T, DPM  polyethylene glycol (MIRALAX) 17 g packet Take 17 g by mouth daily as needed for moderate constipation or severe constipation. 03/02/19   Jaynee Eagles, PA-C  Polyvinyl Alcohol-Povidone (REFRESH OP) Place 1 drop into both eyes 3 (three) times daily as needed (dry eyes).     [provider]  potassium chloride SA (K-DUR,KLOR-CON) 20 MEQ tablet Take 20 mEq by mouth daily.    [provider]  timolol (TIMOPTIC) 0.5 % ophthalmic solution  01/01/19   [provider]  triamcinolone cream (KENALOG) 0.1 % Apply 1 application topically 2 (two) times daily. 02/01/19   Raylene Everts, MD    Allergies    Patient has no known allergies.  Review of Systems   Review of Systems  Constitutional: Positive for fever.  Cardiovascular: Negative for chest pain.  Skin: Positive for wound.  All other systems reviewed and are negative.   Physical Exam Updated Vital Signs BP (!) 143/88 (BP Location: Right Arm)   Pulse 67   Temp 98.4 F (36.9 C) (Oral)   Resp 19   Ht 1.6 m (5\' 3" )   Wt 82.1 kg   SpO2 97%   BMI 32.06 kg/m   Physical Exam CONSTITUTIONAL: Well developed/well nourished HEAD: Normocephalic/atraumatic EYES: EOMI ENMT: Mucous membranes moist NECK: supple no meningeal signs CV: S1/S2 noted, no murmurs/rubs/gallops noted LUNGS: Lungs are clear to auscultation bilaterally, no apparent distress ABDOMEN: soft, nontender, no rebound or guarding, bowel sounds noted throughout abdomen GU:no cva tenderness NEURO: Pt is awake/alert/appropriate, moves all extremitiesx4.  No facial droop.  EXTREMITIES: Tenderness and erythema noted to the dorsal aspect of left foot.  There are maggots noted in the webspaces of her toes.  See photo  below SKIN: warm, color normal PSYCH: no abnormalities of mood noted, alert and oriented to situation       ED Results / Procedures / Treatments   Labs (all labs ordered are listed, but only abnormal results are displayed) Labs Reviewed  BASIC METABOLIC PANEL - Abnormal; Notable for the following components:      Result Value   Glucose, Bld 102 (*)    All other components within normal limits  CBC WITH DIFFERENTIAL/PLATELET - Abnormal; Notable for the following components:   WBC 11.6 (*)  Hemoglobin 11.9 (*)    Platelets 439 (*)    Neutro Abs 8.4 (*)    Abs Immature Granulocytes 0.08 (*)    All other components within normal limits  SARS CORONAVIRUS 2 BY RT PCR (HOSPITAL ORDER, [facility])    EKG None  Radiology DG Foot 2 Views Left  Result Date: 10/14/2019 CLINICAL DATA:  64 year old female with history of left foot pain. EXAM: LEFT FOOT - 2 VIEW COMPARISON:  Left foot radiograph 10/11/2019. FINDINGS: Postoperative changes of arthroplasty at the first MTP joint are again noted. No periprosthetic fracture. Extensive soft tissue swelling is noted overlying the forefoot, particularly dorsally. There is no evidence of fracture or dislocation. There is no evidence of arthropathy or other focal bone abnormality. Soft tissues are unremarkable. IMPRESSION: 1. Extensive soft tissue swelling overlying the forefoot in this patient status post first MTP joint arthroplasty. Electronically Signed   By: Vinnie Langton M.D.   On: 10/14/2019 05:22    Procedures Procedures    Medications Ordered in ED Medications  piperacillin-tazobactam (ZOSYN) IVPB 3.375 g (has no administration in time range)    ED Course  I have reviewed the triage vital signs and the nursing notes.  Pertinent labs & imaging results that were available during my care of the patient were reviewed by me and considered in my medical decision making (see chart for details).    MDM  Rules/Calculators/A&P                          4:35 AM Patient presents with wound infection of her left foot as well as maggots in the wound. Patient was last seen by her podiatrist on July 1.  At that time it was felt that it may be coming infected and she was placed on clindamycin Will consult podiatry and will be admitted  6:51 AM Discussed with Dr. Dr. March Rummage with podiatry.  He was informed of likely wound infection with maggot infestation.  He request to keep patient n.p.o. he will take likely take to the OR later today. Zosyn has been ordered. Discussed with Dr. Josephine Cables with [facility] for admission Final Clinical Impression(s) / ED Diagnoses Final diagnoses:  Wound infection    Rx / DC Orders ED Discharge Orders    None       Ripley Fraise, MD 10/14/19 [phone]

## 2019-10-14 NOTE — Anesthesia Postprocedure Evaluation (Signed)
Anesthesia Post Note  Patient: Amit F Wahlert  Procedure(s) Performed: INCISION AND DRAINAGE FOR LEFT FOOT INFECTION, COMPLEX (Left Foot) BONE BIOPSY PROXIMAL PHALANX (Left Foot)     Patient location during evaluation: PACU Anesthesia Type: MAC Level of consciousness: awake and alert Pain management: pain level controlled Vital Signs Assessment: post-procedure vital signs reviewed and stable Respiratory status: spontaneous breathing, nonlabored ventilation, respiratory function stable and patient connected to nasal cannula oxygen Cardiovascular status: stable and blood pressure returned to baseline Postop Assessment: no apparent nausea or vomiting Anesthetic complications: no   No complications documented.  Last Vitals:  Vitals:   10/14/19 1535 10/14/19 1547  BP: (!) 143/75 129/83  Pulse: 62 79  Resp: 18 16  Temp: 36.6 C 36.6 C  SpO2: 100%     Last Pain:  Vitals:   10/14/19 1547  TempSrc: Oral  PainSc:                  Anwar Crill P Masyn Fullam

## 2019-10-15 ENCOUNTER — Encounter (HOSPITAL_COMMUNITY): Payer: Self-pay | Admitting: Podiatry

## 2019-10-15 DIAGNOSIS — T148XXA Other injury of unspecified body region, initial encounter: Secondary | ICD-10-CM | POA: Diagnosis not present

## 2019-10-15 DIAGNOSIS — L089 Local infection of the skin and subcutaneous tissue, unspecified: Secondary | ICD-10-CM | POA: Diagnosis not present

## 2019-10-15 DIAGNOSIS — L02612 Cutaneous abscess of left foot: Secondary | ICD-10-CM | POA: Diagnosis not present

## 2019-10-15 LAB — CBC
HCT: 34.3 % — ABNORMAL LOW (ref 36.0–46.0)
Hemoglobin: 10.9 g/dL — ABNORMAL LOW (ref 12.0–15.0)
MCH: 29.5 pg (ref 26.0–34.0)
MCHC: 31.8 g/dL (ref 30.0–36.0)
MCV: 93 fL (ref 80.0–100.0)
Platelets: 425 10*3/uL — ABNORMAL HIGH (ref 150–400)
RBC: 3.69 MIL/uL — ABNORMAL LOW (ref 3.87–5.11)
RDW: 12.1 % (ref 11.5–15.5)
WBC: 11 10*3/uL — ABNORMAL HIGH (ref 4.0–10.5)
nRBC: 0 % (ref 0.0–0.2)

## 2019-10-15 LAB — BASIC METABOLIC PANEL
Anion gap: 9 (ref 5–15)
BUN: 15 mg/dL (ref 8–23)
CO2: 26 mmol/L (ref 22–32)
Calcium: 8.7 mg/dL — ABNORMAL LOW (ref 8.9–10.3)
Chloride: 103 mmol/L (ref 98–111)
Creatinine, Ser: 0.83 mg/dL (ref 0.44–1.00)
GFR calc Af Amer: >60 mL/min (ref >60)
GFR calc non Af Amer: >60 mL/min (ref >60)
Glucose, Bld: 120 mg/dL — ABNORMAL HIGH (ref 70–99)
Potassium: 3.9 mmol/L (ref 3.5–5.1)
Sodium: 138 mmol/L (ref 135–145)

## 2019-10-15 MED ORDER — MORPHINE SULFATE (PF) 2 MG/ML IV SOLN
2.0000 mg | Freq: Once | INTRAVENOUS | Status: AC
  Administered 2019-10-15: 2 mg via INTRAVENOUS
  Filled 2019-10-15: qty 1

## 2019-10-15 MED ORDER — HYDROCODONE-ACETAMINOPHEN 5-325 MG PO TABS
1.0000 | ORAL_TABLET | Freq: Four times a day (QID) | ORAL | Status: DC | PRN
  Administered 2019-10-16: 1 via ORAL
  Administered 2019-10-18 – 2019-10-21 (×10): 2 via ORAL
  Administered 2019-10-22 – 2019-10-23 (×4): 1 via ORAL
  Administered 2019-10-24 – 2019-10-25 (×3): 2 via ORAL
  Administered 2019-10-25: 1 via ORAL
  Administered 2019-10-25 – 2019-11-09 (×29): 2 via ORAL
  Filled 2019-10-15 (×2): qty 2
  Filled 2019-10-15: qty 1
  Filled 2019-10-15 (×4): qty 2
  Filled 2019-10-15: qty 1
  Filled 2019-10-15: qty 2
  Filled 2019-10-15: qty 1
  Filled 2019-10-15 (×5): qty 2
  Filled 2019-10-15: qty 1
  Filled 2019-10-15 (×25): qty 2
  Filled 2019-10-15: qty 1
  Filled 2019-10-15 (×7): qty 2

## 2019-10-15 MED ORDER — HYDROCODONE-ACETAMINOPHEN 5-325 MG PO TABS
1.0000 | ORAL_TABLET | Freq: Four times a day (QID) | ORAL | Status: DC | PRN
  Administered 2019-10-15: 1 via ORAL
  Filled 2019-10-15: qty 1

## 2019-10-15 MED ORDER — VANCOMYCIN HCL IN DEXTROSE 1-5 GM/200ML-% IV SOLN
1000.0000 mg | Freq: Two times a day (BID) | INTRAVENOUS | Status: DC
  Administered 2019-10-15 – 2019-10-16 (×2): 1000 mg via INTRAVENOUS
  Filled 2019-10-15 (×3): qty 200

## 2019-10-15 NOTE — Progress Notes (Signed)
I was willing to explant,  Subjective:  Patient ID: Alexis Ball, female    DOB: Jan 15, 1956,  MRN: [id number]  Patient seen bedside.  States that the pain in her foot is getting better. Objective:   Vitals:   10/15/19 0600 10/15/19 0934  BP: (!) 115/57 117/68  Pulse: 60 64  Resp: 18 16  Temp: 98.4 F (36.9 C) 98.2 F (36.8 C)  SpO2: 98%    General AA&O x3. Normal mood and affect.  Vascular  foot warm and well-perfused  Neurologic Epicritic sensation grossly intact.  Dermatologic Dressing clean dry and intact  Orthopedic: Positive motor to the digits   Results for orders placed or performed during the hospital encounter of 10/13/19  SARS Coronavirus 2 by RT PCR (hospital order, performed in [facility] hospital lab) Nasopharyngeal Nasopharyngeal Swab     Status: None   Collection Time: 10/14/19  4:46 AM   Specimen: Nasopharyngeal Swab  Result Value Ref Range Status   SARS Coronavirus 2 NEGATIVE NEGATIVE Final    Comment: (NOTE) SARS-CoV-2 target nucleic acids are NOT DETECTED.  The SARS-CoV-2 RNA is generally detectable in upper and lower respiratory specimens during the acute phase of infection. The lowest concentration of SARS-CoV-2 viral copies this assay can detect is 250 copies / mL. A negative result does not preclude SARS-CoV-2 infection and should not be used as the sole basis for treatment or other patient management decisions.  A negative result may occur with improper specimen collection / handling, submission of specimen other than nasopharyngeal swab, presence of viral mutation(s) within the areas targeted by this assay, and inadequate number of viral copies (<250 copies / mL). A negative result must be combined with clinical observations, patient history, and epidemiological information.  Fact Sheet for Patients:   StrictlyIdeas.no  Fact Sheet for Healthcare Providers: BankingDealers.co.za  This test is  not yet approved or  cleared by the [place] FDA and has been authorized for detection and/or diagnosis of SARS-CoV-2 by FDA under an Emergency Use Authorization (EUA).  This EUA will remain in effect (meaning this test can be used) for the duration of the COVID-19 declaration under Section 564(b)(1) of the Act, 21 U.S.C. section 360bbb-3(b)(1), unless the authorization is terminated or revoked sooner.  Performed at [facility], [place] [address], [place], [place] [address]   Aerobic/Anaerobic Culture (surgical/deep wound)     Status: None (Preliminary result)   Collection Time: 10/14/19  2:37 PM   Specimen: Soft Tissue, Other; Body Fluid  Result Value Ref Range Status   Specimen Description TISSUE  Final   Special Requests 2ND LEFTFOOT  Final   Gram Stain   Final    NO WBC SEEN NO ORGANISMS SEEN Performed at [facility], [address] [place], [place] [address]    Culture FEW GRAM NEGATIVE RODS  Final   Report Status PENDING  Incomplete  Aerobic/Anaerobic Culture (surgical/deep wound)     Status: None (Preliminary result)   Collection Time: 10/14/19  2:42 PM   Specimen: Soft Tissue, Other; Body Fluid  Result Value Ref Range Status   Specimen Description BONE  Final   Special Requests PROXIMAL PHALANX LEFT  Final   Gram Stain   Final    RARE WBC PRESENT, PREDOMINANTLY PMN RARE GRAM POSITIVE COCCI RARE GRAM VARIABLE ROD Performed at [facility], [place] [address], [place], [place] [address]    Culture FEW GRAM NEGATIVE RODS  Final   Report Status PENDING  Incomplete  Aerobic/Anaerobic Culture (surgical/deep wound)     Status: None (Preliminary result)   Collection Time: 10/14/19  2:43 PM   Specimen: Other Source; Tissue  Result Value Ref Range Status   Specimen Description TISSUE  Final   Special Requests 2ND INTERSPACE  Final   Gram Stain   Final    RARE WBC PRESENT, PREDOMINANTLY PMN NO ORGANISMS SEEN Performed at [facility], [address], [place], [place] [address]    Culture FEW GRAM NEGATIVE RODS  Final   Report Status PENDING  Incomplete    Assessment & Plan:  Patient was evaluated and treated and all questions answered.  Left foot abscess; now with evidence of hardware infection and osteomyelitis -Cultures taken yesterday do suggest infection in the bone. -Continue empiric antibiotic therapy until cultures are finalized. -Discussed surgical options with patient.  We did discuss amputation as resolution option for infection.  Patient does not want amputation and elects for salvage option.  In this case she will need removal of the implant with bone debridement/resection, insertion of antibiotic spacer with application of external fixator. She will also need possible wound VAC for enhanced wound healing.  I will do that after 6 weeks of IVs if the plan -She will likely need a PICC line for long-term antibiotic therapy -Recommend ID consult -Wound care: Daily packing changes with quarter inch iodoform packing.  Orders placed.  Pain medication ordered for dressing changes.  Plan for RTOR as planned tomorrow or Wednesday  Evelina Bucy, DPM  Accessible via secure chat for questions or concerns.

## 2019-10-15 NOTE — Plan of Care (Signed)

## 2019-10-15 NOTE — Evaluation (Signed)
Physical Therapy Evaluation Patient Details Name: Alexis Ball MRN: [id number] DOB: 01/19/1956 Today's Date: 10/15/2019   History of Present Illness  Pt is a 64 yo female presenting s/p surgical drainage of L foot infection 7/4 with bone biopsy due to concern for possible osteomyelitis after bunionectomy with silicone implant on 3/24. PMH includes: HTN, and stroke.  Clinical Impression  Pt in bed upon arrival of PT, agreeable to evaluation at this time. Prior to admission the pt was living with her children to recover from prior surgery (typically lives alone), where she was mobilizing with use of a cane, and receiving some assist from children for bathing and dressing. The pt now presents with limitations in functional mobility, strength, stability, and endurance due to above dx, and will continue to benefit from skilled PT to address these deficits. The pt was able to demo great tolerance for bed mobility and a good short bout of walking in her room. The pt is doing very well for her first time out of bed, but will continue to benefit from skilled PT acutely to progress independence and safety with transfers as well as to improve ambulation endurance.      Follow Up Recommendations Home health PT;Supervision/Assistance - 24 hour    Equipment Recommendations   (pt has needed equipment)    Recommendations for Other Services       Precautions / Restrictions Precautions Precautions: Fall Required Braces or Orthoses: Other Brace Other Brace: surgical shoe Restrictions Weight Bearing Restrictions: Yes LLE Weight Bearing: Partial weight bearing LLE Partial Weight Bearing Percentage or Pounds: WB through heel only      Mobility  Bed Mobility Overal bed mobility: Needs Assistance Bed Mobility: Supine to Sit     Supine to sit: Supervision     General bed mobility comments: supervision for line management, pt able to come to EOB and complete all adjustments without  assist  Transfers Overall transfer level: Needs assistance Equipment used: Rolling walker (2 wheeled) Transfers: Sit to/from Stand Sit to Stand: Supervision         General transfer comment: pt is able to rise without assist, but VC for hand placement. x3 through session  Ambulation/Gait Ambulation/Gait assistance: Min guard   Assistive device: Rolling walker (2 wheeled) Gait Pattern/deviations: Step-to pattern;Decreased stride length;Trunk flexed     General Gait Details: pt with short steps in room with minimal clearance but good ability to maintain WB through heel only.      Balance Overall balance assessment: Needs assistance Sitting-balance support: Bilateral upper extremity supported Sitting balance-Leahy Scale: Good     Standing balance support: Bilateral upper extremity supported Standing balance-Leahy Scale: Fair Standing balance comment: minG for safety                             Pertinent Vitals/Pain Pain Assessment: 0-10 Pain Score: 8  Pain Location: surgical site Pain Descriptors / Indicators: Sore;Grimacing Pain Intervention(s): Limited activity within patient's tolerance;Monitored during session;Patient requesting pain meds-RN notified;Repositioned    Home Living Family/patient expects to be discharged to:: Private residence Living Arrangements: Children (pt typically lives alone, can return to living with her children) Available Help at Discharge: Family Type of Home: House Home Access: Stairs to enter Entrance Stairs-Rails: Psychiatric nurse of Steps: 7 Home Layout: One level Home Equipment: Cane - single point;Walker - 2 wheels Additional Comments: pt lives in 1st floor apt with 8 steps from parking lot to her door  with one rail. has grab bars in bathroom    Prior Function Level of Independence: Needs assistance   Gait / Transfers Assistance Needed: pt ambulates with cane, use of cane, rail, and 2 kids on  stairs.  ADL's / Homemaking Assistance Needed: kids hlep with spongebath and dressling LE, complete all ADLs in the home        Hand Dominance   Dominant Hand: Right    Extremity/Trunk Assessment   Upper Extremity Assessment Upper Extremity Assessment: Overall WFL for tasks assessed    Lower Extremity Assessment Lower Extremity Assessment: LLE deficits/detail LLE Deficits / Details: WB through heel only    Cervical / Trunk Assessment Cervical / Trunk Assessment: Kyphotic  Communication      Cognition Arousal/Alertness: Awake/alert Behavior During Therapy: WFL for tasks assessed/performed Overall Cognitive Status: Within Functional Limits for tasks assessed                                 General Comments: pt with good recal of WB status, good adherence with mobility      General Comments General comments (skin integrity, edema, etc.): VSS on RA, pt very pleasant and eager to participate    Exercises     Assessment/Plan    PT Assessment Patient needs continued PT services  PT Problem List Decreased strength;Decreased mobility;Decreased range of motion;Decreased activity tolerance;Decreased balance;Decreased knowledge of use of DME;Pain       PT Treatment Interventions DME instruction;Gait training;Stair training;Functional mobility training;Therapeutic activities;Patient/family education;Therapeutic exercise;Balance training    PT Goals (Current goals can be found in the Care Plan section)  Acute Rehab PT Goals Patient Stated Goal: return home PT Goal Formulation: With patient Time For Goal Achievement: 10/29/19 Potential to Achieve Goals: Good    Frequency Min 4X/week   Barriers to discharge           AM-PAC PT "6 Clicks" Mobility  Outcome Measure Help needed turning from your back to your side while in a flat bed without using bedrails?: None Help needed moving from lying on your back to sitting on the side of a flat bed without using  bedrails?: None Help needed moving to and from a bed to a chair (including a wheelchair)?: A Little Help needed standing up from a chair using your arms (e.g., wheelchair or bedside chair)?: A Little Help needed to walk in hospital room?: A Little Help needed climbing 3-5 steps with a railing? : A Little 6 Click Score: 20    End of Session Equipment Utilized During Treatment: Gait belt;Other (comment) (boot on LLE) Activity Tolerance: Patient tolerated treatment well;Patient limited by pain;Patient limited by fatigue Patient left: in chair;with call bell/phone within reach Nurse Communication: Mobility status;Weight bearing status PT Visit Diagnosis: Other abnormalities of gait and mobility (R26.89);Muscle weakness (generalized) (M62.81);Pain Pain - Right/Left: Left Pain - part of body: Ankle and joints of foot    Time: 3300-7622 PT Time Calculation (min) (ACUTE ONLY): 39 min   Charges:   PT Evaluation $PT Eval Moderate Complexity: 1 Mod PT Treatments $Gait Training: 8-22 mins $Therapeutic Activity: 8-22 mins        Karma Ganja, PT, DPT   Acute Rehabilitation Department Pager #: [phone]  Otho Bellows 10/15/2019, 11:46 AM

## 2019-10-15 NOTE — Progress Notes (Signed)
  Pharmacy Antibiotic Note  Alexis Ball is a 64 y.o. female admitted on 10/13/2019 with foot abscess and osteomyelitis s/p complex I&D.  Pharmacy has been consulted for vancomycin and pip/tazo dosing.  Today, patient continues to be afebrile, WBC 11. SCr is stable. Wound/bone cultures still pending but showing some GNRs in cultures.  Plan: Increase vancomycin to 1000mg  IV Q12hr (goal trough 15-20) Continue Zosyn 3.375g IV q8hr extended infusion Monitor cultures, clinical status, renal fx Narrow abx as able and f/u duration    Height: 5\' 3"  (160 cm) Weight: 82.1 kg (181 lb) IBW/kg (Calculated) : 52.4  Temp (24hrs), Avg:98 F (36.7 C), Min:97.4 F (36.3 C), Max:98.4 F (36.9 C)  Recent Labs  Lab 10/14/19 0437 10/15/19 0356  WBC 11.6* 11.0*  CREATININE 0.80 0.83    Estimated Creatinine Clearance: 71.3 mL/min (by C-G formula based on SCr of 0.83 mg/dL).    No Known Allergies  Antimicrobials this admission: Vanc 7/4 >>  Piptazo 7/4 >>   Microbiology results: 7/4 wound Cxs: GNRs, pending 7/4 bone Cxs: GNRs, pending   Brendolyn Patty, PharmD Clinical Pharmacist  10/15/2019   11:56 AM   Please check AMION for all [facility] phone numbers After 10:00 PM, call the [facility] [phone]

## 2019-10-15 NOTE — Progress Notes (Addendum)
Pain med given, dressing changed done per order.

## 2019-10-15 NOTE — Progress Notes (Signed)
Progress Note    Alexis Ball  ATF:[id number] DOB: 30-Jan-1956  DOA: 10/13/2019 PCP: Lucianne Lei, MD    Brief Narrative:    Medical records reviewed and are as summarized below:  Alexis Ball is an 64 y.o. female with medical history significant of HTN, remote CVA, and recent bunionectomy with silicone implant of her left foot on June 25.  Patient started to have increasing pain and drainage from the surgical wound after she tripped and fell down on the foot last week. She went to podiatrist office 3 days ago, x-ray showed no significant displacement and of the implant and MTP joints, but there was a local infection going on, podiatrist ordered clindamycin plus Keflex regimen which she has been taking.  But today, she noticed that the wound is "busted open" with maggots coming out.  Assessment/Plan:   Active Problems:   Abscess of bursa of left foot   Foot abscess, left   Wound infection   Left forefoot abscess and cellulitis and question of osteomyelitis, status post I&D on 7/4 by Dr. March Rummage -per Dr. March Rummage: did not remove the implant today, but do not preclude that she will need removal of the implant in the future - merely that findings today did not seem to indicate that the implant was the source of the infection and thus removal of the implant was necessary for control of the infection. Intraoperative findings today show that the worst of the infection was at the second/third MPJ and appeared to be spreading to the first metatarsal. Though there was purulence at the first metatarsal incision and the hardware was exposed, it did not extend deep to this area.  Should the cultures of the bone suggest infection at this area, she may require removal of the implant.  We may need to discuss this with infectious disease once cultures return.  In either case she will need return to the operating room at an undetermined date for either delayed closure with or without VAC therapy for  enhanced wound healing.  -Escalate antibiotic to vancomycin and Zosyn for now while waiting for culture -Wound care and weight bearing as per podiatrist  HTN -continue home regimen  History of CVA -No acute issue, continue aspirin  Chronic iron deficiency anemia -On iron supplement, outpatient GI follow-up  obesity Body mass index is 32.06 kg/m.   Family Communication/Anticipated D/C date and plan/Code Status   DVT prophylaxis: Lovenox ordered. Code Status: Full Code.  Disposition Plan: Status is: Inpatient  Remains inpatient appropriate because:IV treatments appropriate due to intensity of illness or inability to take PO   Dispo: The patient is from: Home              Anticipated d/c is to: Home              Anticipated d/c date is: 3 days              Patient currently is not medically stable to d/c.         Medical Consultants:    Podiatry.     Subjective:   Was given ibuprofen earlier this morning but is continuing to have pain for something which (nursing notified  Objective:    Vitals:   10/14/19 1547 10/14/19 2150 10/15/19 0030 10/15/19 0600  BP: 129/83 110/65 136/62 (!) 115/57  Pulse: 79 76 64 60  Resp: 16 16 18 18   Temp: 97.9 F (36.6 C) 98.3 F (36.8 C) (!) 97.4 F (36.3  C) 98.4 F (36.9 C)  TempSrc: Oral Oral Oral Oral  SpO2:   98% 98%  Weight:      Height:        Intake/Output Summary (Last 24 hours) at 10/15/2019 0856 Last data filed at 10/14/2019 1800 Gross per 24 hour  Intake 970 ml  Output --  Net 970 ml   Filed Weights   10/13/19 2329  Weight: 82.1 kg    Exam:  General: Appearance:    Obese female in no acute distress     Lungs:     Clear to auscultation bilaterally, respirations unlabored  Heart:    Normal heart rate. Normal rhythm. No murmurs, rubs, or gallops.      Neurologic:   Awake, alert, oriented x 3. No apparent focal neurological           defect.     Data Reviewed:   I have personally reviewed  following labs and imaging studies:  Labs: Labs show the following:   Basic Metabolic Panel: Recent Labs  Lab 10/14/19 0437 10/15/19 0356  NA 137 138  K 4.0 3.9  CL 100 103  CO2 28 26  GLUCOSE 102* 120*  BUN 16 15  CREATININE 0.80 0.83  CALCIUM 9.4 8.7*   GFR Estimated Creatinine Clearance: 71.3 mL/min (by C-G formula based on SCr of 0.83 mg/dL). Liver Function Tests: No results for input(s): AST, ALT, ALKPHOS, BILITOT, PROT, ALBUMIN in the last 168 hours. No results for input(s): LIPASE, AMYLASE in the last 168 hours. No results for input(s): AMMONIA in the last 168 hours. Coagulation profile No results for input(s): INR, PROTIME in the last 168 hours.  CBC: Recent Labs  Lab 10/14/19 0437 10/15/19 0356  WBC 11.6* 11.0*  NEUTROABS 8.4*  --   HGB 11.9* 10.9*  HCT 36.5 34.3*  MCV 92.2 93.0  PLT 439* 425*   Cardiac Enzymes: No results for input(s): CKTOTAL, CKMB, CKMBINDEX, TROPONINI in the last 168 hours. BNP (last 3 results) No results for input(s): PROBNP in the last 8760 hours. CBG: No results for input(s): GLUCAP in the last 168 hours. D-Dimer: No results for input(s): DDIMER in the last 72 hours. Hgb A1c: No results for input(s): HGBA1C in the last 72 hours. Lipid Profile: No results for input(s): CHOL, HDL, LDLCALC, TRIG, CHOLHDL, LDLDIRECT in the last 72 hours. Thyroid function studies: No results for input(s): TSH, T4TOTAL, T3FREE, THYROIDAB in the last 72 hours.  Invalid input(s): FREET3 Anemia work up: No results for input(s): VITAMINB12, FOLATE, FERRITIN, TIBC, IRON, RETICCTPCT in the last 72 hours. Sepsis Labs: Recent Labs  Lab 10/14/19 0437 10/15/19 0356  WBC 11.6* 11.0*    Microbiology Recent Results (from the past 240 hour(s))  SARS Coronavirus 2 by RT PCR (hospital order, performed in [facility] hospital lab) Nasopharyngeal Nasopharyngeal Swab     Status: None   Collection Time: 10/14/19  4:46 AM   Specimen: Nasopharyngeal Swab    Result Value Ref Range Status   SARS Coronavirus 2 NEGATIVE NEGATIVE Final    Comment: (NOTE) SARS-CoV-2 target nucleic acids are NOT DETECTED.  The SARS-CoV-2 RNA is generally detectable in upper and lower respiratory specimens during the acute phase of infection. The lowest concentration of SARS-CoV-2 viral copies this assay can detect is 250 copies / mL. A negative result does not preclude SARS-CoV-2 infection and should not be used as the sole basis for treatment or other patient management decisions.  A negative result may occur with improper specimen collection /  handling, submission of specimen other than nasopharyngeal swab, presence of viral mutation(s) within the areas targeted by this assay, and inadequate number of viral copies (<250 copies / mL). A negative result must be combined with clinical observations, patient history, and epidemiological information.  Fact Sheet for Patients:   StrictlyIdeas.no  Fact Sheet for Healthcare Providers: BankingDealers.co.za  This test is not yet approved or  cleared by the [place] FDA and has been authorized for detection and/or diagnosis of SARS-CoV-2 by FDA under an Emergency Use Authorization (EUA).  This EUA will remain in effect (meaning this test can be used) for the duration of the COVID-19 declaration under Section 564(b)(1) of the Act, 21 U.S.C. section 360bbb-3(b)(1), unless the authorization is terminated or revoked sooner.  Performed at [facility], [place] [address] [place], [place] [address]   Aerobic/Anaerobic Culture (surgical/deep wound)     Status: None (Preliminary result)   Collection Time: 10/14/19  2:37 PM   Specimen: Soft Tissue, Other; Body Fluid  Result Value Ref Range Status   Specimen Description TISSUE  Final   Special Requests 2ND LEFTFOOT  Final   Gram Stain   Final    NO WBC SEEN NO ORGANISMS SEEN Performed at [facility],  [address] [place], [place] [address]    Culture FEW GRAM NEGATIVE RODS  Final   Report Status PENDING  Incomplete  Aerobic/Anaerobic Culture (surgical/deep wound)     Status: None (Preliminary result)   Collection Time: 10/14/19  2:42 PM   Specimen: Soft Tissue, Other; Body Fluid  Result Value Ref Range Status   Specimen Description BONE  Final   Special Requests PROXIMAL PHALANX LEFT  Final   Gram Stain   Final    RARE WBC PRESENT, PREDOMINANTLY PMN RARE GRAM POSITIVE COCCI RARE GRAM VARIABLE ROD Performed at [facility], [place] [address] [place], [place] [address]    Culture FEW GRAM NEGATIVE RODS  Final   Report Status PENDING  Incomplete  Aerobic/Anaerobic Culture (surgical/deep wound)     Status: None (Preliminary result)   Collection Time: 10/14/19  2:43 PM   Specimen: Other Source; Tissue  Result Value Ref Range Status   Specimen Description TISSUE  Final   Special Requests 2ND INTERSPACE  Final   Gram Stain   Final    RARE WBC PRESENT, PREDOMINANTLY PMN NO ORGANISMS SEEN Performed at [facility], [address], [place],  [address]    Culture FEW GRAM NEGATIVE RODS  Final   Report Status PENDING  Incomplete    Procedures and diagnostic studies:  MR FOOT LEFT WO CONTRAST  Result Date: 10/14/2019 CLINICAL DATA:  Patient status post bunionectomy and placement of a silicone implant at the first MTP joint 10/05/2019. Pain and drainage at the surgical wound. EXAM: MRI OF THE LEFT FOOT WITHOUT CONTRAST TECHNIQUE: Multiplanar, multisequence MR imaging of the left foot was performed. No intravenous contrast was administered. COMPARISON:  Plain films left foot earlier today. FINDINGS: Bones/Joint/Cartilage Implant is in place in the first MTP joint and results in some artifact on the exam. There is marrow edema throughout the first metatarsal and great toe including the distal phalanx where no hardware is present. Edema is also seen in both the medial and  lateral sesamoids. No joint effusion. Ligaments Visualization is limited about the first MTP joint but no obvious tear is seen. Muscles and Tendons No intramuscular fluid collection or mass. There is edema and plantar musculature  deep to the first metatarsal. No intramuscular fluid collection. Soft tissues A skin wound on the dorsum of the foot at the level of the distal third metatarsal. And underlying heterogeneous fluid collection measures 1.6 cm transverse by 0.9 cm craniocaudal by approximately 3 cm long. Extensive subcutaneous edema is present diffusely. On the coronal T1 weighted images, there appears to be soft tissue gas over the dorsum of the foot near the level of the tarsometatarsal joints. IMPRESSION: Skin wound on the dorsum of the foot at the level of the distal third metatarsal with an underlying fluid collection likely representing an abscess. Status post implant placement at the first MTP joint. Marrow edema throughout the first metatarsal and proximal phalanx of the great toe could be postoperative but may be due to osteomyelitis given the soft tissue wound and abscess. There is also marrow edema in both the medial and lateral sesamoids in the distal phalanx of the great toe which may reflect postoperative change but is worrisome for osteomyelitis. Diffuse subcutaneous edema about the foot compatible with cellulitis. On coronal images, there appears to be soft tissue gas in the dorsum of the foot proximally. This is not seen on the prior plain films. Edema in the flexor hallucis muscle subjacent to the first metatarsal could be postoperative but may represent myositis. No intramuscular abscess. Electronically Signed   By: Inge Rise M.D.   On: 10/14/2019 12:20   DG Foot 2 Views Left  Result Date: 10/14/2019 CLINICAL DATA:  64 year old female with history of left foot pain. EXAM: LEFT FOOT - 2 VIEW COMPARISON:  Left foot radiograph 10/11/2019. FINDINGS: Postoperative changes of  arthroplasty at the first MTP joint are again noted. No periprosthetic fracture. Extensive soft tissue swelling is noted overlying the forefoot, particularly dorsally. There is no evidence of fracture or dislocation. There is no evidence of arthropathy or other focal bone abnormality. Soft tissues are unremarkable. IMPRESSION: 1. Extensive soft tissue swelling overlying the forefoot in this patient status post first MTP joint arthroplasty. Electronically Signed   By: Vinnie Langton M.D.   On: 10/14/2019 05:22    Medications:    aspirin EC  81 mg Oral Daily   brimonidine  1 drop Both Eyes BID   dorzolamide-timolol  1 drop Both Eyes BID   enoxaparin (LOVENOX) injection  40 mg Subcutaneous Q24H   ferrous sulfate  325 mg Oral Q breakfast   gabapentin  300 mg Oral q morning - 10a   And   gabapentin  600 mg Oral QHS   hydrochlorothiazide  25 mg Oral Daily   latanoprost  1 drop Both Eyes QHS   multivitamin with minerals  1 tablet Oral Daily   omega-3 acid ethyl esters  1 g Oral Daily   Continuous Infusions:  piperacillin-tazobactam (ZOSYN)  IV 3.375 g (10/15/19 0137)   vancomycin       LOS: 1 day   Geradine Girt  [facility]   How to contact the [facility] Attending or Consulting provider [place] or covering provider during after hours [place], for this patient?  1. Check the care team in [facility] and look for a) attending/consulting [facility] provider listed and b) the [facility] team listed 2. Log into [email or web address] and use [place]'s universal password to access. If you do not have the password, please contact the hospital operator. 3. Locate the [facility] provider you are looking for under [facility] and page to a number that you can be directly reached. 4. If  you still have difficulty reaching the provider, please page the [facility] (Director on Call) for the Hospitalists listed on amion for assistance.  10/15/2019, 8:56 AM

## 2019-10-16 ENCOUNTER — Inpatient Hospital Stay: Payer: Self-pay

## 2019-10-16 DIAGNOSIS — M86172 Other acute osteomyelitis, left ankle and foot: Secondary | ICD-10-CM | POA: Diagnosis not present

## 2019-10-16 DIAGNOSIS — L089 Local infection of the skin and subcutaneous tissue, unspecified: Secondary | ICD-10-CM | POA: Diagnosis not present

## 2019-10-16 DIAGNOSIS — T847XXA Infection and inflammatory reaction due to other internal orthopedic prosthetic devices, implants and grafts, initial encounter: Secondary | ICD-10-CM

## 2019-10-16 DIAGNOSIS — L02612 Cutaneous abscess of left foot: Secondary | ICD-10-CM | POA: Diagnosis not present

## 2019-10-16 DIAGNOSIS — M71072 Abscess of bursa, left ankle and foot: Secondary | ICD-10-CM | POA: Diagnosis not present

## 2019-10-16 DIAGNOSIS — T148XXA Other injury of unspecified body region, initial encounter: Secondary | ICD-10-CM | POA: Diagnosis not present

## 2019-10-16 LAB — CBC
HCT: 31.9 % — ABNORMAL LOW (ref 36.0–46.0)
Hemoglobin: 10.1 g/dL — ABNORMAL LOW (ref 12.0–15.0)
MCH: 29.5 pg (ref 26.0–34.0)
MCHC: 31.7 g/dL (ref 30.0–36.0)
MCV: 93.3 fL (ref 80.0–100.0)
Platelets: 376 10*3/uL (ref 150–400)
RBC: 3.42 MIL/uL — ABNORMAL LOW (ref 3.87–5.11)
RDW: 12.4 % (ref 11.5–15.5)
WBC: 8.5 10*3/uL (ref 4.0–10.5)
nRBC: 0 % (ref 0.0–0.2)

## 2019-10-16 LAB — BASIC METABOLIC PANEL
Anion gap: 10 (ref 5–15)
BUN: 11 mg/dL (ref 8–23)
CO2: 24 mmol/L (ref 22–32)
Calcium: 8.6 mg/dL — ABNORMAL LOW (ref 8.9–10.3)
Chloride: 104 mmol/L (ref 98–111)
Creatinine, Ser: 0.93 mg/dL (ref 0.44–1.00)
GFR calc Af Amer: >60 mL/min (ref >60)
GFR calc non Af Amer: >60 mL/min (ref >60)
Glucose, Bld: 116 mg/dL — ABNORMAL HIGH (ref 70–99)
Potassium: 3.3 mmol/L — ABNORMAL LOW (ref 3.5–5.1)
Sodium: 138 mmol/L (ref 135–145)

## 2019-10-16 LAB — SURGICAL PCR SCREEN
MRSA, PCR: NEGATIVE
Staphylococcus aureus: NEGATIVE

## 2019-10-16 MED ORDER — SODIUM CHLORIDE 0.9 % IV SOLN
2.0000 g | Freq: Three times a day (TID) | INTRAVENOUS | Status: DC
  Administered 2019-10-16 – 2019-10-18 (×6): 2 g via INTRAVENOUS
  Filled 2019-10-16 (×6): qty 2

## 2019-10-16 MED ORDER — POTASSIUM CHLORIDE CRYS ER 20 MEQ PO TBCR
40.0000 meq | EXTENDED_RELEASE_TABLET | Freq: Once | ORAL | Status: AC
  Administered 2019-10-16: 40 meq via ORAL
  Filled 2019-10-16: qty 2

## 2019-10-16 NOTE — Progress Notes (Signed)
,  Subjective:  Patient ID: Alexis Ball, female    DOB: 1955/09/05,  MRN: [id number]  Patient seen bedside. Pain better today. Tolerated dressing change today better than yesterday. Objective:   Vitals:   10/16/19 0804 10/16/19 1510  BP: 124/70 104/78  Pulse: 62 65  Resp: 16 16  Temp: 98.6 F (37 C) 98.4 F (36.9 C)  SpO2: 100% 99%   General AA&O x3. Normal mood and affect.  Vascular  foot warm and well-perfused  Neurologic Epicritic sensation grossly intact.  Dermatologic Dressing clean dry and intact  Orthopedic: Positive motor to the digits   Results for orders placed or performed during the hospital encounter of 10/13/19  SARS Coronavirus 2 by RT PCR (hospital order, performed in [facility] hospital lab) Nasopharyngeal Nasopharyngeal Swab     Status: None   Collection Time: 10/14/19  4:46 AM   Specimen: Nasopharyngeal Swab  Result Value Ref Range Status   SARS Coronavirus 2 NEGATIVE NEGATIVE Final    Comment: (NOTE) SARS-CoV-2 target nucleic acids are NOT DETECTED.  The SARS-CoV-2 RNA is generally detectable in upper and lower respiratory specimens during the acute phase of infection. The lowest concentration of SARS-CoV-2 viral copies this assay can detect is 250 copies / mL. A negative result does not preclude SARS-CoV-2 infection and should not be used as the sole basis for treatment or other patient management decisions.  A negative result may occur with improper specimen collection / handling, submission of specimen other than nasopharyngeal swab, presence of viral mutation(s) within the areas targeted by this assay, and inadequate number of viral copies (<250 copies / mL). A negative result must be combined with clinical observations, patient history, and epidemiological information.  Fact Sheet for Patients:   StrictlyIdeas.no  Fact Sheet for Healthcare Providers: BankingDealers.co.za  This test is not yet  approved or  cleared by the [place] FDA and has been authorized for detection and/or diagnosis of SARS-CoV-2 by FDA under an Emergency Use Authorization (EUA).  This EUA will remain in effect (meaning this test can be used) for the duration of the COVID-19 declaration under Section 564(b)(1) of the Act, 21 U.S.C. section 360bbb-3(b)(1), unless the authorization is terminated or revoked sooner.  Performed at [facility], [place] [address] [place], [place] [address]   Aerobic/Anaerobic Culture (surgical/deep wound)     Status: None (Preliminary result)   Collection Time: 10/14/19  2:37 PM   Specimen: Soft Tissue, Other; Body Fluid  Result Value Ref Range Status   Specimen Description TISSUE SWAB  Final   Special Requests 2ND LEFTFOOT  Final   Gram Stain   Final    NO WBC SEEN NO ORGANISMS SEEN Performed at [facility], [address] [place], [place] [address]    Culture   Final    FEW ENTEROBACTER CLOACAE NO ANAEROBES ISOLATED; CULTURE IN PROGRESS FOR 5 DAYS    Report Status PENDING  Incomplete   Organism ID, Bacteria ENTEROBACTER CLOACAE  Final      Susceptibility   Enterobacter cloacae - MIC*    CEFAZOLIN >=64 RESISTANT Resistant     CEFEPIME <=0.12 SENSITIVE Sensitive     CEFTAZIDIME <=1 SENSITIVE Sensitive     CIPROFLOXACIN <=0.25 SENSITIVE Sensitive     GENTAMICIN <=1 SENSITIVE Sensitive     IMIPENEM 0.5 SENSITIVE Sensitive     TRIMETH/SULFA <=20 SENSITIVE Sensitive     PIP/TAZO <=4 SENSITIVE Sensitive     * FEW ENTEROBACTER CLOACAE  Aerobic/Anaerobic  Culture (surgical/deep wound)     Status: None (Preliminary result)   Collection Time: 10/14/19  2:42 PM   Specimen: Soft Tissue, Other; Body Fluid  Result Value Ref Range Status   Specimen Description BONE  Final   Special Requests PROXIMAL PHALANX LEFT  Final   Gram Stain   Final    RARE WBC PRESENT, PREDOMINANTLY PMN RARE GRAM POSITIVE COCCI RARE GRAM VARIABLE ROD Performed at [facility], [place] [address] [place], [place] [address]    Culture FEW ENTEROBACTER CLOACAE  Final   Report Status PENDING  Incomplete   Organism ID, Bacteria ENTEROBACTER CLOACAE  Final      Susceptibility   Enterobacter cloacae - MIC*    CEFAZOLIN >=64 RESISTANT Resistant     CEFEPIME <=0.12 SENSITIVE Sensitive     CEFTAZIDIME <=1 SENSITIVE Sensitive     CIPROFLOXACIN <=0.25 SENSITIVE Sensitive     GENTAMICIN <=1 SENSITIVE Sensitive     IMIPENEM 0.5 SENSITIVE Sensitive     TRIMETH/SULFA <=20 SENSITIVE Sensitive     PIP/TAZO <=4 SENSITIVE Sensitive     * FEW ENTEROBACTER CLOACAE  Aerobic/Anaerobic Culture (surgical/deep wound)     Status: None (Preliminary result)   Collection Time: 10/14/19  2:43 PM   Specimen: Other Source; Tissue  Result Value Ref Range Status   Specimen Description TISSUE SWAB  Final   Special Requests 2ND INTERSPACE  Final   Gram Stain   Final    RARE WBC PRESENT, PREDOMINANTLY PMN NO ORGANISMS SEEN Performed at [facility], [place] [address], [place], [place] [address]    Culture   Final    FEW ENTEROBACTER CLOACAE NO ANAEROBES ISOLATED; CULTURE IN PROGRESS FOR 5 DAYS    Report Status PENDING  Incomplete   Organism ID, Bacteria ENTEROBACTER CLOACAE  Final      Susceptibility   Enterobacter cloacae - MIC*    CEFAZOLIN >=64 RESISTANT Resistant     CEFEPIME <=0.12 SENSITIVE Sensitive     CEFTAZIDIME <=1 SENSITIVE Sensitive     CIPROFLOXACIN <=0.25 SENSITIVE Sensitive     GENTAMICIN <=1 SENSITIVE Sensitive     IMIPENEM 0.5 SENSITIVE Sensitive     TRIMETH/SULFA <=20 SENSITIVE Sensitive     PIP/TAZO <=4 SENSITIVE Sensitive     * FEW ENTEROBACTER CLOACAE    Assessment & Plan:  Patient was evaluated and treated and all questions answered.  Left foot abscess; now with evidence of hardware infection and osteomyelitis -Abx per ID. -OR tomorrow for removal of the implant with bone debridement/resection, insertion of antibiotic spacer with application of  external fixator; possible wound VAC. -Wound care: Daily packing changes with quarter inch iodoform packing.    RTOR as planned tomorrow. Will likely be ok for D/c Thursday.  Evelina Bucy, DPM  Accessible via secure chat for questions or concerns.

## 2019-10-16 NOTE — Progress Notes (Signed)
Progress Note    Alexis Ball  ZMO:[id number] DOB: Nov 09, 1955  DOA: 10/13/2019 PCP: Lucianne Lei, MD    Brief Narrative:    Medical records reviewed and are as summarized below:  Alexis Ball is an 64 y.o. female with medical history significant of HTN, remote CVA, and recent bunionectomy with silicone implant of her left foot on June 25.  Patient started to have increasing pain and drainage from the surgical wound after she tripped and fell down on the foot last week. She went to podiatrist office 3 days ago, x-ray showed no significant displacement and of the implant and MTP joints, but there was a local infection going on, podiatrist ordered clindamycin plus Keflex regimen which she has been taking.  But today, she noticed that the wound is "busted open" with maggots coming out.    Assessment/Plan:   Active Problems:   Abscess of bursa of left foot   Foot abscess, left   Wound infection   Left forefoot abscess and cellulitis plus osteomyelitis, status post I&D on 7/4 by Dr. March Rummage -vancomycin and Zosyn for now while waiting for culture -per Dr. March Rummage: Discussed surgical options with patient.  We did discuss amputation as resolution option for infection.  Patient does not want amputation and elects for salvage option.  In this case she will need removal of the implant with bone debridement/resection, insertion of antibiotic spacer with application of external fixator. She will also need possible wound VAC for enhanced wound healing.  I will do that after 6 weeks of IVs if the plan -She will likely need a PICC line for long-term antibiotic therapy -Recommend ID consult -I have placed ID consult to Dr. Linus Salmons who will see patient -I have ordered PICC Line  HTN -continue home regimen  Hypokalemia -replete  History of CVA -No acute issue, continue aspirin  Chronic iron deficiency anemia -On iron supplement, outpatient GI follow-up  obesity Body mass index is  32.06 kg/m.   Family Communication/Anticipated D/C date and plan/Code Status   DVT prophylaxis: Lovenox ordered. Code Status: Full Code.  Disposition Plan: Status is: Inpatient  Remains inpatient appropriate because:IV treatments appropriate due to intensity of illness or inability to take PO   Dispo: The patient is from: Home              Anticipated d/c is to: Home              Anticipated d/c date is: 3 days              Patient currently is not medically stable to d/c.         Medical Consultants:   Podiatry. ID    Subjective:   No overnight events, pain controlled  Objective:    Vitals:   10/15/19 1812 10/15/19 2040 10/16/19 0308 10/16/19 0804  BP: 132/86 103/62 121/69 124/70  Pulse: 82 68 72 62  Resp:  16 19 16   Temp:  98.3 F (36.8 C) 98.3 F (36.8 C) 98.6 F (37 C)  TempSrc:  Oral Oral Oral  SpO2:  96% 97% 100%  Weight:      Height:        Intake/Output Summary (Last 24 hours) at 10/16/2019 0914 Last data filed at 10/16/2019 0300 Gross per 24 hour  Intake 552.03 ml  Output --  Net 552.03 ml   Filed Weights   10/13/19 2329  Weight: 82.1 kg    Exam:   General: Appearance:  Mildly obese female in no acute distress     Lungs:     Clear to auscultation bilaterally, respirations unlabored  Heart:    Normal heart rate. Normal rhythm. No murmurs, rubs, or gallops.      Neurologic:   Awake, alert, oriented x 3. No apparent focal neurological           defect.     Data Reviewed:   I have personally reviewed following labs and imaging studies:  Labs: Labs show the following:   Basic Metabolic Panel: Recent Labs  Lab 10/14/19 0437 10/14/19 0437 10/15/19 0356 10/16/19 0424  NA 137  --  138 138  K 4.0   < > 3.9 3.3*  CL 100  --  103 104  CO2 28  --  26 24  GLUCOSE 102*  --  120* 116*  BUN 16  --  15 11  CREATININE 0.80  --  0.83 0.93  CALCIUM 9.4  --  8.7* 8.6*   < > = values in this interval not displayed.   GFR Estimated  Creatinine Clearance: 63.7 mL/min (by C-G formula based on SCr of 0.93 mg/dL). Liver Function Tests: No results for input(s): AST, ALT, ALKPHOS, BILITOT, PROT, ALBUMIN in the last 168 hours. No results for input(s): LIPASE, AMYLASE in the last 168 hours. No results for input(s): AMMONIA in the last 168 hours. Coagulation profile No results for input(s): INR, PROTIME in the last 168 hours.  CBC: Recent Labs  Lab 10/14/19 0437 10/15/19 0356 10/16/19 0424  WBC 11.6* 11.0* 8.5  NEUTROABS 8.4*  --   --   HGB 11.9* 10.9* 10.1*  HCT 36.5 34.3* 31.9*  MCV 92.2 93.0 93.3  PLT 439* 425* 376   Cardiac Enzymes: No results for input(s): CKTOTAL, CKMB, CKMBINDEX, TROPONINI in the last 168 hours. BNP (last 3 results) No results for input(s): PROBNP in the last 8760 hours. CBG: No results for input(s): GLUCAP in the last 168 hours. D-Dimer: No results for input(s): DDIMER in the last 72 hours. Hgb A1c: No results for input(s): HGBA1C in the last 72 hours. Lipid Profile: No results for input(s): CHOL, HDL, LDLCALC, TRIG, CHOLHDL, LDLDIRECT in the last 72 hours. Thyroid function studies: No results for input(s): TSH, T4TOTAL, T3FREE, THYROIDAB in the last 72 hours.  Invalid input(s): FREET3 Anemia work up: No results for input(s): VITAMINB12, FOLATE, FERRITIN, TIBC, IRON, RETICCTPCT in the last 72 hours. Sepsis Labs: Recent Labs  Lab 10/14/19 0437 10/15/19 0356 10/16/19 0424  WBC 11.6* 11.0* 8.5    Microbiology Recent Results (from the past 240 hour(s))  SARS Coronavirus 2 by RT PCR (hospital order, performed in [facility] hospital lab) Nasopharyngeal Nasopharyngeal Swab     Status: None   Collection Time: 10/14/19  4:46 AM   Specimen: Nasopharyngeal Swab  Result Value Ref Range Status   SARS Coronavirus 2 NEGATIVE NEGATIVE Final    Comment: (NOTE) SARS-CoV-2 target nucleic acids are NOT DETECTED.  The SARS-CoV-2 RNA is generally detectable in upper and lower respiratory  specimens during the acute phase of infection. The lowest concentration of SARS-CoV-2 viral copies this assay can detect is 250 copies / mL. A negative result does not preclude SARS-CoV-2 infection and should not be used as the sole basis for treatment or other patient management decisions.  A negative result may occur with improper specimen collection / handling, submission of specimen other than nasopharyngeal swab, presence of viral mutation(s) within the areas targeted by this assay, and inadequate  number of viral copies (<250 copies / mL). A negative result must be combined with clinical observations, patient history, and epidemiological information.  Fact Sheet for Patients:   StrictlyIdeas.no  Fact Sheet for Healthcare Providers: BankingDealers.co.za  This test is not yet approved or  cleared by the [place] FDA and has been authorized for detection and/or diagnosis of SARS-CoV-2 by FDA under an Emergency Use Authorization (EUA).  This EUA will remain in effect (meaning this test can be used) for the duration of the COVID-19 declaration under Section 564(b)(1) of the Act, 21 U.S.C. section 360bbb-3(b)(1), unless the authorization is terminated or revoked sooner.  Performed at [facility], [place] [address] [place], [place] [address]   Aerobic/Anaerobic Culture (surgical/deep wound)     Status: None (Preliminary result)   Collection Time: 10/14/19  2:37 PM   Specimen: Soft Tissue, Other; Body Fluid  Result Value Ref Range Status   Specimen Description TISSUE  Final   Special Requests 2ND LEFTFOOT  Final   Gram Stain   Final    NO WBC SEEN NO ORGANISMS SEEN Performed at [facility], [address] [place], [place] [address]    Culture FEW GRAM NEGATIVE RODS  Final   Report Status PENDING  Incomplete  Aerobic/Anaerobic Culture (surgical/deep wound)     Status: None (Preliminary result)   Collection Time:  10/14/19  2:42 PM   Specimen: Soft Tissue, Other; Body Fluid  Result Value Ref Range Status   Specimen Description BONE  Final   Special Requests PROXIMAL PHALANX LEFT  Final   Gram Stain   Final    RARE WBC PRESENT, PREDOMINANTLY PMN RARE GRAM POSITIVE COCCI RARE GRAM VARIABLE ROD Performed at [facility], [place] [address], [place], [place] [address]    Culture FEW GRAM NEGATIVE RODS  Final   Report Status PENDING  Incomplete  Aerobic/Anaerobic Culture (surgical/deep wound)     Status: None (Preliminary result)   Collection Time: 10/14/19  2:43 PM   Specimen: Other Source; Tissue  Result Value Ref Range Status   Specimen Description TISSUE  Final   Special Requests 2ND INTERSPACE  Final   Gram Stain   Final    RARE WBC PRESENT, PREDOMINANTLY PMN NO ORGANISMS SEEN Performed at [facility], [address], [place],  [address]    Culture FEW GRAM NEGATIVE RODS  Final   Report Status PENDING  Incomplete    Procedures and diagnostic studies:  MR FOOT LEFT WO CONTRAST  Result Date: 10/14/2019 CLINICAL DATA:  Patient status post bunionectomy and placement of a silicone implant at the first MTP joint 10/05/2019. Pain and drainage at the surgical wound. EXAM: MRI OF THE LEFT FOOT WITHOUT CONTRAST TECHNIQUE: Multiplanar, multisequence MR imaging of the left foot was performed. No intravenous contrast was administered. COMPARISON:  Plain films left foot earlier today. FINDINGS: Bones/Joint/Cartilage Implant is in place in the first MTP joint and results in some artifact on the exam. There is marrow edema throughout the first metatarsal and great toe including the distal phalanx where no hardware is present. Edema is also seen in both the medial and lateral sesamoids. No joint effusion. Ligaments Visualization is limited about the first MTP joint but no obvious tear is seen. Muscles and Tendons No intramuscular fluid collection or mass. There is edema and plantar musculature  deep to the first metatarsal. No intramuscular fluid collection. Soft tissues A skin wound on the dorsum of the foot at  the level of the distal third metatarsal. And underlying heterogeneous fluid collection measures 1.6 cm transverse by 0.9 cm craniocaudal by approximately 3 cm long. Extensive subcutaneous edema is present diffusely. On the coronal T1 weighted images, there appears to be soft tissue gas over the dorsum of the foot near the level of the tarsometatarsal joints. IMPRESSION: Skin wound on the dorsum of the foot at the level of the distal third metatarsal with an underlying fluid collection likely representing an abscess. Status post implant placement at the first MTP joint. Marrow edema throughout the first metatarsal and proximal phalanx of the great toe could be postoperative but may be due to osteomyelitis given the soft tissue wound and abscess. There is also marrow edema in both the medial and lateral sesamoids in the distal phalanx of the great toe which may reflect postoperative change but is worrisome for osteomyelitis. Diffuse subcutaneous edema about the foot compatible with cellulitis. On coronal images, there appears to be soft tissue gas in the dorsum of the foot proximally. This is not seen on the prior plain films. Edema in the flexor hallucis muscle subjacent to the first metatarsal could be postoperative but may represent myositis. No intramuscular abscess. Electronically Signed   By: Inge Rise M.D.   On: 10/14/2019 12:20   [place] EKG SITE RITE  Result Date: 10/16/2019 If Site Rite image not attached, placement could not be confirmed due to current cardiac rhythm.   Medications:   . aspirin EC  81 mg Oral Daily  . brimonidine  1 drop Both Eyes BID  . dorzolamide-timolol  1 drop Both Eyes BID  . enoxaparin (LOVENOX) injection  40 mg Subcutaneous Q24H  . ferrous sulfate  325 mg Oral Q breakfast  . gabapentin  300 mg Oral q morning - 10a   And  . gabapentin  600 mg Oral  QHS  . latanoprost  1 drop Both Eyes QHS  . multivitamin with minerals  1 tablet Oral Daily  . omega-3 acid ethyl esters  1 g Oral Daily   Continuous Infusions: . piperacillin-tazobactam (ZOSYN)  IV 12.5 mL/hr at 10/16/19 0300  . vancomycin 1,000 mg (10/16/19 0814)     LOS: 2 days   Geradine Girt  [facility]   How to contact the [facility] Attending or Consulting provider [place] or covering provider during after hours [place], for this patient?  1. Check the care team in [facility] and look for a) attending/consulting TRH provider listed and b) the [facility] team listed 2. Log into [email or web address] and use [place]'s universal password to access. If you do not have the password, please contact the hospital operator. 3. Locate the [facility] provider you are looking for under [facility] and page to a number that you can be directly reached. 4. If you still have difficulty reaching the provider, please page the [facility] (Director on Call) for the Hospitalists listed on amion for assistance.  10/16/2019, 9:14 AM

## 2019-10-16 NOTE — H&P (View-Only) (Signed)
,  Subjective:  Patient ID: Alexis Ball, female    DOB: 10-Dec-1955,  MRN: [id number]  Patient seen bedside. Pain better today. Tolerated dressing change today better than yesterday. Objective:   Vitals:   10/16/19 0804 10/16/19 1510  BP: 124/70 104/78  Pulse: 62 65  Resp: 16 16  Temp: 98.6 F (37 C) 98.4 F (36.9 C)  SpO2: 100% 99%   General AA&O x3. Normal mood and affect.  Vascular  foot warm and well-perfused  Neurologic Epicritic sensation grossly intact.  Dermatologic Dressing clean dry and intact  Orthopedic: Positive motor to the digits   Results for orders placed or performed during the hospital encounter of 10/13/19  SARS Coronavirus 2 by RT PCR (hospital order, performed in [facility] hospital lab) Nasopharyngeal Nasopharyngeal Swab     Status: None   Collection Time: 10/14/19  4:46 AM   Specimen: Nasopharyngeal Swab  Result Value Ref Range Status   SARS Coronavirus 2 NEGATIVE NEGATIVE Final    Comment: (NOTE) SARS-CoV-2 target nucleic acids are NOT DETECTED.  The SARS-CoV-2 RNA is generally detectable in upper and lower respiratory specimens during the acute phase of infection. The lowest concentration of SARS-CoV-2 viral copies this assay can detect is 250 copies / mL. A negative result does not preclude SARS-CoV-2 infection and should not be used as the sole basis for treatment or other patient management decisions.  A negative result may occur with improper specimen collection / handling, submission of specimen other than nasopharyngeal swab, presence of viral mutation(s) within the areas targeted by this assay, and inadequate number of viral copies (<250 copies / mL). A negative result must be combined with clinical observations, patient history, and epidemiological information.  Fact Sheet for Patients:   StrictlyIdeas.no  Fact Sheet for Healthcare Providers: BankingDealers.co.za  This test is not yet  approved or  cleared by the [place] FDA and has been authorized for detection and/or diagnosis of SARS-CoV-2 by FDA under an Emergency Use Authorization (EUA).  This EUA will remain in effect (meaning this test can be used) for the duration of the COVID-19 declaration under Section 564(b)(1) of the Act, 21 U.S.C. section 360bbb-3(b)(1), unless the authorization is terminated or revoked sooner.  Performed at [facility], [place] [address], [place], [place] [address]   Aerobic/Anaerobic Culture (surgical/deep wound)     Status: None (Preliminary result)   Collection Time: 10/14/19  2:37 PM   Specimen: Soft Tissue, Other; Body Fluid  Result Value Ref Range Status   Specimen Description TISSUE SWAB  Final   Special Requests 2ND LEFTFOOT  Final   Gram Stain   Final    NO WBC SEEN NO ORGANISMS SEEN Performed at [facility], [address], [place], [place] [address]    Culture   Final    FEW ENTEROBACTER CLOACAE NO ANAEROBES ISOLATED; CULTURE IN PROGRESS FOR 5 DAYS    Report Status PENDING  Incomplete   Organism ID, Bacteria ENTEROBACTER CLOACAE  Final      Susceptibility   Enterobacter cloacae - MIC*    CEFAZOLIN >=64 RESISTANT Resistant     CEFEPIME <=0.12 SENSITIVE Sensitive     CEFTAZIDIME <=1 SENSITIVE Sensitive     CIPROFLOXACIN <=0.25 SENSITIVE Sensitive     GENTAMICIN <=1 SENSITIVE Sensitive     IMIPENEM 0.5 SENSITIVE Sensitive     TRIMETH/SULFA <=20 SENSITIVE Sensitive     PIP/TAZO <=4 SENSITIVE Sensitive     * FEW ENTEROBACTER CLOACAE  Aerobic/Anaerobic  Culture (surgical/deep wound)     Status: None (Preliminary result)   Collection Time: 10/14/19  2:42 PM   Specimen: Soft Tissue, Other; Body Fluid  Result Value Ref Range Status   Specimen Description BONE  Final   Special Requests PROXIMAL PHALANX LEFT  Final   Gram Stain   Final    RARE WBC PRESENT, PREDOMINANTLY PMN RARE GRAM POSITIVE COCCI RARE GRAM VARIABLE ROD Performed at [facility], [address] [place], [place] [address]    Culture FEW ENTEROBACTER CLOACAE  Final   Report Status PENDING  Incomplete   Organism ID, Bacteria ENTEROBACTER CLOACAE  Final      Susceptibility   Enterobacter cloacae - MIC*    CEFAZOLIN >=64 RESISTANT Resistant     CEFEPIME <=0.12 SENSITIVE Sensitive     CEFTAZIDIME <=1 SENSITIVE Sensitive     CIPROFLOXACIN <=0.25 SENSITIVE Sensitive     GENTAMICIN <=1 SENSITIVE Sensitive     IMIPENEM 0.5 SENSITIVE Sensitive     TRIMETH/SULFA <=20 SENSITIVE Sensitive     PIP/TAZO <=4 SENSITIVE Sensitive     * FEW ENTEROBACTER CLOACAE  Aerobic/Anaerobic Culture (surgical/deep wound)     Status: None (Preliminary result)   Collection Time: 10/14/19  2:43 PM   Specimen: Other Source; Tissue  Result Value Ref Range Status   Specimen Description TISSUE SWAB  Final   Special Requests 2ND INTERSPACE  Final   Gram Stain   Final    RARE WBC PRESENT, PREDOMINANTLY PMN NO ORGANISMS SEEN Performed at [facility], [place] [address], [place],  [address]    Culture   Final    FEW ENTEROBACTER CLOACAE NO ANAEROBES ISOLATED; CULTURE IN PROGRESS FOR 5 DAYS    Report Status PENDING  Incomplete   Organism ID, Bacteria ENTEROBACTER CLOACAE  Final      Susceptibility   Enterobacter cloacae - MIC*    CEFAZOLIN >=64 RESISTANT Resistant     CEFEPIME <=0.12 SENSITIVE Sensitive     CEFTAZIDIME <=1 SENSITIVE Sensitive     CIPROFLOXACIN <=0.25 SENSITIVE Sensitive     GENTAMICIN <=1 SENSITIVE Sensitive     IMIPENEM 0.5 SENSITIVE Sensitive     TRIMETH/SULFA <=20 SENSITIVE Sensitive     PIP/TAZO <=4 SENSITIVE Sensitive     * FEW ENTEROBACTER CLOACAE    Assessment & Plan:  Patient was evaluated and treated and all questions answered.  Left foot abscess; now with evidence of hardware infection and osteomyelitis -Abx per ID. -OR tomorrow for removal of the implant with bone debridement/resection, insertion of antibiotic spacer with application of  external fixator; possible wound VAC. -Wound care: Daily packing changes with quarter inch iodoform packing.    RTOR as planned tomorrow. Will likely be ok for D/c Thursday.  Evelina Bucy, DPM  Accessible via secure chat for questions or concerns.

## 2019-10-16 NOTE — Plan of Care (Signed)

## 2019-10-16 NOTE — TOC Initial Note (Signed)
Transition of Care [facility]) - Initial/Assessment Note    Patient Details  Name: Alexis Ball MRN: [id number] Date of Birth: 12-Sep-1955  Transition of Care [facility]) CM/SW Contact:    Sharin Mons, RN Phone Number: 10/16/2019, 2:54 PM  Clinical Narrative:    Admitted with Left forefoot abscess and cellulitis ? Osteomyelitis. Hx of HTN,CVA, and recent bunionectomy with silicone implant of her left foot on June 25.      - s/p L foot  I&D and bone bx,10/14/2019   Plan:  Return to the OR 7/8  for removal of the implant with bone debridement/resection, insertion of antibiotic spacer with application of external fixator; possible wound VAC.  Per ID pt will need LT IV ABX therapy @ d/c, 6wks.  NCM spoke with pt and daughter in law [name] @ bedside regarding TOC needs... pt agreeable to home health services. Pt without preference. Referral made with Marnee Guarneri for  home IV ABX infusion.  Pt will d/c to [address] [place] [address] , [phone] Audie Pinto ( daughter in law).  TOC team will continue to monitor for needs ....  Expected Discharge Plan: [place] Barriers to Discharge: Continued Medical Work up   Patient Goals and CMS Choice     Choice offered to / list presented to : Patient  Expected Discharge Plan and Services Expected Discharge Plan: [place]                         DME Arranged:  (IV ABX THERAPY) DME Agency: Other - Comment ([facility]) Date DME Agency Contacted: 10/15/19 Time DME Agency Contacted: [phone] Representative spoke with at DME Agency: [place]: PT, RN [facility] Agency :  pending  Prior Living Arrangements/Services                       Activities of Daily Living Home Assistive Devices/Equipment: Environmental consultant (specify type) ADL Screening (condition at time of admission) Patient's cognitive ability adequate to safely complete daily activities?: Yes Is the patient deaf or have difficulty hearing?:  No Does the patient have difficulty seeing, even when wearing glasses/contacts?: No Does the patient have difficulty concentrating, remembering, or making decisions?: No Patient able to express need for assistance with ADLs?: Yes Does the patient have difficulty dressing or bathing?: No Independently performs ADLs?: Yes (appropriate for developmental age) Does the patient have difficulty walking or climbing stairs?: Yes Weakness of Legs: Left Weakness of Arms/Hands: None  Permission Sought/Granted                  Emotional Assessment              Admission diagnosis:  Wound infection [T14.8XXA, L08.9] Foot abscess, left [L02.612] Patient Active Problem List   Diagnosis Date Noted  . Abscess of bursa of left foot 10/14/2019  . Foot abscess, left 10/14/2019  . Wound infection   . Unilateral primary osteoarthritis, left knee 04/27/2019  . Chondromalacia patellae, left knee 07/02/2016  . Arthritis of left knee 02/25/2016  . Vaginitis and vulvovaginitis, unspecified 08/08/2013  . Leiomyoma of uterus, unspecified 08/08/2013  . Atypical chest pain 03/13/2013  . Abnormal ECG 03/13/2013  . Obesity, unspecified 03/13/2013  . Hypertension    PCP:  Lucianne Lei, MD Pharmacy:   [facility], [address] AT [place]  [place] [address] Phone: [phone] Fax: [phone]  [id number] [address], [name] [address] Phone: [phone] Fax: [phone]     Social Determinants of Health (SDOH) Interventions    Readmission Risk Interventions No flowsheet data found.

## 2019-10-16 NOTE — Progress Notes (Signed)
Physical Therapy Treatment Patient Details Name: Alexis Ball MRN: [id number] DOB: Jan 01, 1956 Today's Date: 10/16/2019    History of Present Illness Pt is a 64 yo female presenting s/p surgical drainage of L foot infection 7/4 with bone biopsy due to concern for possible osteomyelitis after bunionectomy with silicone implant on 3/81. PMH includes: HTN, and stroke.    PT Comments    Patient progressing well towards PT goals. Reports feeling tightness in left ankle/foot, "feels like the bandage is getting tighter." Encouraged AROM and elevation of LLE. Tolerated short distance ambulation with Min guard and use of RW for support. Pt with difficulty maintaining WB thru heel due to poor sensation and wearing CAM boot. Tolerated there ex without difficulty. Encouraged OOB to chair for dinner and increasing activity. Will follow.   Follow Up Recommendations  Home health PT;Supervision/Assistance - 24 hour     Equipment Recommendations  None recommended by PT    Recommendations for Other Services       Precautions / Restrictions Precautions Precautions: Fall Required Braces or Orthoses: Other Brace Other Brace: CAM boot LLE Restrictions Weight Bearing Restrictions: Yes LLE Weight Bearing: Partial weight bearing LLE Partial Weight Bearing Percentage or Pounds: WB thru heel only    Mobility  Bed Mobility Overal bed mobility: Needs Assistance Bed Mobility: Supine to Sit;Sit to Supine     Supine to sit: Supervision;HOB elevated Sit to supine: Supervision;HOB elevated   General bed mobility comments: Not able to get into bed wearing boot due to it being too heavy. Use of rail.  Transfers Overall transfer level: Needs assistance Equipment used: Rolling walker (2 wheeled) Transfers: Sit to/from Stand Sit to Stand: Min guard         General transfer comment: Min guard for safety. Stood from [employer]. Cues for hand placement.  Ambulation/Gait Ambulation/Gait assistance: Min  guard Gait Distance (Feet): 35 Feet Assistive device: Rolling walker (2 wheeled) Gait Pattern/deviations: Step-to pattern;Decreased stride length;Trunk flexed Gait velocity: decreased   General Gait Details: Slow, short steps with decreased foot clearance but difficulty maintaining WB thru heel in boot due to impaired sensation.   Stairs             Wheelchair Mobility    Modified Rankin (Stroke Patients Only)       Balance Overall balance assessment: Needs assistance Sitting-balance support: Feet supported;No upper extremity supported Sitting balance-Leahy Scale: Good Sitting balance - Comments: total A to donn CAM boot   Standing balance support: During functional activity Standing balance-Leahy Scale: Poor Standing balance comment: Requires UE support in standing.                            Cognition Arousal/Alertness: Awake/alert Behavior During Therapy: WFL for tasks assessed/performed Overall Cognitive Status: Within Functional Limits for tasks assessed                                        Exercises General Exercises - Lower Extremity Long Arc Quad: AROM;Both;10 reps;Seated    General Comments General comments (skin integrity, edema, etc.): Noted to have some swelling in toes, pt reports feeling tightness.      Pertinent Vitals/Pain Pain Assessment: Faces Faces Pain Scale: Hurts a little bit Pain Location: surgical site Pain Descriptors / Indicators: Sore Pain Intervention(s): Monitored during session;Repositioned    Home Living  Prior Function            PT Goals (current goals can now be found in the care plan section) Progress towards PT goals: Progressing toward goals    Frequency    Min 3X/week      PT Plan Frequency needs to be updated;Current plan remains appropriate    Co-evaluation              AM-PAC PT "6 Clicks" Mobility   Outcome Measure  Help needed  turning from your back to your side while in a flat bed without using bedrails?: None Help needed moving from lying on your back to sitting on the side of a flat bed without using bedrails?: None Help needed moving to and from a bed to a chair (including a wheelchair)?: A Little Help needed standing up from a chair using your arms (e.g., wheelchair or bedside chair)?: A Little Help needed to walk in hospital room?: A Little Help needed climbing 3-5 steps with a railing? : A Little 6 Click Score: 20    End of Session Equipment Utilized During Treatment: Gait belt;Other (comment) (CAm boot LLE) Activity Tolerance: Patient tolerated treatment well;Patient limited by fatigue Patient left: in bed;with call bell/phone within reach;with bed alarm set;with family/visitor present Nurse Communication: Mobility status PT Visit Diagnosis: Other abnormalities of gait and mobility (R26.89);Muscle weakness (generalized) (M62.81);Pain Pain - Right/Left: Left Pain - part of body: Ankle and joints of foot     Time: 3338-3291 PT Time Calculation (min) (ACUTE ONLY): 28 min  Charges:  $Gait Training: 8-22 mins $Therapeutic Activity: 8-22 mins                     Marisa Severin, PT, DPT Acute Rehabilitation Services Pager [phone] Office [place] 10/16/2019, 3:50 PM

## 2019-10-16 NOTE — Consult Note (Addendum)
[facility] Infectious Disease       Reason for Consult: osteomyelitis with hardware     Referring Physician: Dr. Eliseo Squires  Active Problems:   Abscess of bursa of left foot   Foot abscess, left   Wound infection   . aspirin EC  81 mg Oral Daily  . brimonidine  1 drop Both Eyes BID  . dorzolamide-timolol  1 drop Both Eyes BID  . enoxaparin (LOVENOX) injection  40 mg Subcutaneous Q24H  . ferrous sulfate  325 mg Oral Q breakfast  . gabapentin  300 mg Oral q morning - 10a   And  . gabapentin  600 mg Oral QHS  . latanoprost  1 drop Both Eyes QHS  . multivitamin with minerals  1 tablet Oral Daily  . omega-3 acid ethyl esters  1 g Oral Daily    Recommendations: Removal of hardware/implant 6 weeks of IV cefepime after removal If removal not possible at this time, I favor oral suppression until implant can be removed I will hold on the picc line for now pending further evaluation and will reorder    Assessment: She has a clear infection with osteomyelitis based on the OP report, MRI and bacterial culture and with hardware in place, which is at the site of the infection.  As long as the hardware is in place, bacteria will remain so will need complete osteomyelitis treatment after the removal, when that is possible.  At this time, will use cefepime based on the culture.  Once removal is possible, will start the clock for the 6 weeks of IV therapy.    Antibiotics: Vancomycin and piperacillin/tazobactam  HPI: Alexis Ball is a 64 y.o. female with history of hypertension, CVA with a recent bunionectomy and silicone implant in her left foot done on 6/25 by Dr. Milinda Pointer.  She developed a post op infection including presence of maggots.  No fever or chills.  She had been placed on empiric clindamycin and Keflex.  She was offered/recommended amputation but has refused.  Culture with Enterobacter cloacae.  Her only complaint is pain in the foot.   Review of Systems:   Constitutional: negative for fevers and chills Gastrointestinal: negative for nausea and diarrhea Integument/breast: negative for rash All other systems reviewed and are negative    Past Medical History:  Diagnosis Date  . Abnormal ECG 03/13/2013   TWI diffuse. Was seen prior to cath in 2007. No CAD.  Marland Kitchen Atypical chest pain 03/13/2013  . Hypertension   . Stroke [facility])     Social History   Tobacco Use  . Smoking status: Never Smoker  . Smokeless tobacco: Never Used  Vaping Use  . Vaping Use: Never used  Substance Use Topics  . Alcohol use: No  . Drug use: No    Family History  Problem Relation Age of Onset  . Hypertension Mother     No Known Allergies  Physical Exam: Constitutional: in no apparent distress  Vitals:   10/16/19 0308 10/16/19 0804  BP: 121/69 124/70  Pulse: 72 62  Resp: 19 16  Temp: 98.3 F (36.8 C) 98.6 F (37 C)  SpO2: 97% 100%   EYES: anicteric ENMT: no thrush Cardiovascular: Cor RRR Respiratory: clear; GI: soft Musculoskeletal: foot wrapped Skin: negatives: no rash   Lab Results  Component Value Date   WBC 8.5 10/16/2019   HGB 10.1 (L) 10/16/2019   HCT 31.9 (L) 10/16/2019   MCV 93.3 10/16/2019   PLT 376 10/16/2019  Lab Results  Component Value Date   CREATININE 0.93 10/16/2019   BUN 11 10/16/2019   NA 138 10/16/2019   K 3.3 (L) 10/16/2019   CL 104 10/16/2019   CO2 24 10/16/2019    Lab Results  Component Value Date   ALT 18 08/05/2017   AST 19 08/05/2017   ALKPHOS 53 08/05/2017     Microbiology: Recent Results (from the past 240 hour(s))  SARS Coronavirus 2 by RT PCR (hospital order, performed in [place] hospital lab) Nasopharyngeal Nasopharyngeal Swab     Status: None   Collection Time: 10/14/19  4:46 AM   Specimen: Nasopharyngeal Swab  Result Value Ref Range Status   SARS Coronavirus 2 NEGATIVE NEGATIVE Final    Comment: (NOTE) SARS-CoV-2 target nucleic acids are NOT DETECTED.  The SARS-CoV-2 RNA is generally  detectable in upper and lower respiratory specimens during the acute phase of infection. The lowest concentration of SARS-CoV-2 viral copies this assay can detect is 250 copies / mL. A negative result does not preclude SARS-CoV-2 infection and should not be used as the sole basis for treatment or other patient management decisions.  A negative result may occur with improper specimen collection / handling, submission of specimen other than nasopharyngeal swab, presence of viral mutation(s) within the areas targeted by this assay, and inadequate number of viral copies (<250 copies / mL). A negative result must be combined with clinical observations, patient history, and epidemiological information.  Fact Sheet for Patients:   StrictlyIdeas.no  Fact Sheet for Healthcare Providers: BankingDealers.co.za  This test is not yet approved or  cleared by the [place] FDA and has been authorized for detection and/or diagnosis of SARS-CoV-2 by FDA under an Emergency Use Authorization (EUA).  This EUA will remain in effect (meaning this test can be used) for the duration of the COVID-19 declaration under Section 564(b)(1) of the Act, 21 U.S.C. section 360bbb-3(b)(1), unless the authorization is terminated or revoked sooner.  Performed at [facility], [place] [address], [place], [place] [address]   Aerobic/Anaerobic Culture (surgical/deep wound)     Status: None (Preliminary result)   Collection Time: 10/14/19  2:37 PM   Specimen: Soft Tissue, Other; Body Fluid  Result Value Ref Range Status   Specimen Description TISSUE SWAB  Final   Special Requests 2ND LEFTFOOT  Final   Gram Stain   Final    NO WBC SEEN NO ORGANISMS SEEN Performed at [facility], [address] [place], [place] [address]    Culture FEW ENTEROBACTER CLOACAE  Final   Report Status PENDING  Incomplete   Organism ID, Bacteria ENTEROBACTER CLOACAE  Final       Susceptibility   Enterobacter cloacae - MIC*    CEFAZOLIN >=64 RESISTANT Resistant     CEFEPIME <=0.12 SENSITIVE Sensitive     CEFTAZIDIME <=1 SENSITIVE Sensitive     CIPROFLOXACIN <=0.25 SENSITIVE Sensitive     GENTAMICIN <=1 SENSITIVE Sensitive     IMIPENEM 0.5 SENSITIVE Sensitive     TRIMETH/SULFA <=20 SENSITIVE Sensitive     PIP/TAZO <=4 SENSITIVE Sensitive     * FEW ENTEROBACTER CLOACAE  Aerobic/Anaerobic Culture (surgical/deep wound)     Status: None (Preliminary result)   Collection Time: 10/14/19  2:42 PM   Specimen: Soft Tissue, Other; Body Fluid  Result Value Ref Range Status   Specimen Description BONE  Final   Special Requests PROXIMAL PHALANX LEFT  Final   Gram Stain   Final  RARE WBC PRESENT, PREDOMINANTLY PMN RARE GRAM POSITIVE COCCI RARE GRAM VARIABLE ROD Performed at [facility], [place] [address] [place], [place] [address]    Culture FEW ENTEROBACTER CLOACAE  Final   Report Status PENDING  Incomplete   Organism ID, Bacteria ENTEROBACTER CLOACAE  Final      Susceptibility   Enterobacter cloacae - MIC*    CEFAZOLIN >=64 RESISTANT Resistant     CEFEPIME <=0.12 SENSITIVE Sensitive     CEFTAZIDIME <=1 SENSITIVE Sensitive     CIPROFLOXACIN <=0.25 SENSITIVE Sensitive     GENTAMICIN <=1 SENSITIVE Sensitive     IMIPENEM 0.5 SENSITIVE Sensitive     TRIMETH/SULFA <=20 SENSITIVE Sensitive     PIP/TAZO <=4 SENSITIVE Sensitive     * FEW ENTEROBACTER CLOACAE  Aerobic/Anaerobic Culture (surgical/deep wound)     Status: None (Preliminary result)   Collection Time: 10/14/19  2:43 PM   Specimen: Other Source; Tissue  Result Value Ref Range Status   Specimen Description TISSUE SWAB  Final   Special Requests 2ND INTERSPACE  Final   Gram Stain   Final    RARE WBC PRESENT, PREDOMINANTLY PMN NO ORGANISMS SEEN Performed at [facility], [place] [address], [place], [place] [address]    Culture FEW ENTEROBACTER CLOACAE  Final   Report Status PENDING  Incomplete    Organism ID, Bacteria ENTEROBACTER CLOACAE  Final      Susceptibility   Enterobacter cloacae - MIC*    CEFAZOLIN >=64 RESISTANT Resistant     CEFEPIME <=0.12 SENSITIVE Sensitive     CEFTAZIDIME <=1 SENSITIVE Sensitive     CIPROFLOXACIN <=0.25 SENSITIVE Sensitive     GENTAMICIN <=1 SENSITIVE Sensitive     IMIPENEM 0.5 SENSITIVE Sensitive     TRIMETH/SULFA <=20 SENSITIVE Sensitive     PIP/TAZO <=4 SENSITIVE Sensitive     * FEW ENTEROBACTER CLOACAE    Thayer Headings, MD [facility] [email or web address] 10/16/2019, 12:35 PM

## 2019-10-17 ENCOUNTER — Inpatient Hospital Stay (HOSPITAL_COMMUNITY): Payer: Medicare HMO

## 2019-10-17 ENCOUNTER — Telehealth: Payer: Self-pay | Admitting: Podiatry

## 2019-10-17 ENCOUNTER — Inpatient Hospital Stay (HOSPITAL_COMMUNITY): Payer: Medicare HMO | Admitting: Certified Registered"

## 2019-10-17 ENCOUNTER — Encounter (HOSPITAL_COMMUNITY): Payer: Self-pay | Admitting: Internal Medicine

## 2019-10-17 ENCOUNTER — Encounter (HOSPITAL_COMMUNITY)
Admission: EM | Disposition: A | Discharge status: Skilled Nursing Facility | Payer: Self-pay | Source: Home / Self Care | Attending: Internal Medicine

## 2019-10-17 ENCOUNTER — Inpatient Hospital Stay: Payer: Self-pay

## 2019-10-17 HISTORY — PX: ORIF CALCANEOUS FRACTURE: SHX5030

## 2019-10-17 HISTORY — PX: IRRIGATION AND DEBRIDEMENT FOOT: SHX6602

## 2019-10-17 LAB — SURGICAL PATHOLOGY

## 2019-10-17 LAB — AEROBIC/ANAEROBIC CULTURE W GRAM STAIN (SURGICAL/DEEP WOUND)

## 2019-10-17 SURGERY — IRRIGATION AND DEBRIDEMENT FOOT
Anesthesia: Monitor Anesthesia Care | Laterality: Left

## 2019-10-17 MED ORDER — BUPIVACAINE-EPINEPHRINE (PF) 0.5% -1:200000 IJ SOLN
INTRAMUSCULAR | Status: DC | PRN
Start: 2019-10-17 — End: 2019-10-17
  Administered 2019-10-17: 30 mL via PERINEURAL

## 2019-10-17 MED ORDER — HYDROMORPHONE HCL 1 MG/ML IJ SOLN
0.2500 mg | INTRAMUSCULAR | Status: DC | PRN
  Administered 2019-10-17 (×2): 0.5 mg via INTRAVENOUS

## 2019-10-17 MED ORDER — MIDAZOLAM HCL 2 MG/2ML IJ SOLN: 2.0000 mg | Freq: Once | INTRAMUSCULAR | Status: AC

## 2019-10-17 MED ORDER — ONDANSETRON HCL 4 MG/2ML IJ SOLN: 4.0000 mg | Freq: Once | INTRAMUSCULAR | Status: DC | PRN

## 2019-10-17 MED ORDER — SODIUM CHLORIDE 0.9 % IR SOLN
Status: DC | PRN
  Administered 2019-10-17: 3000 mL

## 2019-10-17 MED ORDER — 0.9 % SODIUM CHLORIDE (POUR BTL) OPTIME
TOPICAL | Status: DC | PRN
  Administered 2019-10-17: 1000 mL

## 2019-10-17 MED ORDER — MIDAZOLAM HCL 2 MG/2ML IJ SOLN
INTRAMUSCULAR | Status: AC
  Filled 2019-10-17: qty 2

## 2019-10-17 MED ORDER — PROPOFOL 10 MG/ML IV BOLUS
INTRAVENOUS | Status: DC | PRN
  Administered 2019-10-17: 15 mg via INTRAVENOUS
  Administered 2019-10-17: 20 mg via INTRAVENOUS

## 2019-10-17 MED ORDER — LACTATED RINGERS IV SOLN: INTRAVENOUS | Status: DC

## 2019-10-17 MED ORDER — LIDOCAINE-EPINEPHRINE (PF) 1.5 %-1:200000 IJ SOLN
INTRAMUSCULAR | Status: DC | PRN
  Administered 2019-10-17: 30 mL via PERINEURAL

## 2019-10-17 MED ORDER — HYDROMORPHONE HCL 1 MG/ML IJ SOLN
INTRAMUSCULAR | Status: AC
  Filled 2019-10-17: qty 1

## 2019-10-17 MED ORDER — FENTANYL CITRATE (PF) 250 MCG/5ML IJ SOLN
INTRAMUSCULAR | Status: AC
  Filled 2019-10-17: qty 5

## 2019-10-17 MED ORDER — VANCOMYCIN HCL 1000 MG IV SOLR
INTRAVENOUS | Status: AC
  Filled 2019-10-17: qty 1000

## 2019-10-17 MED ORDER — ORAL CARE MOUTH RINSE: 15.0000 mL | Freq: Once | OROMUCOSAL | Status: AC

## 2019-10-17 MED ORDER — CHLORHEXIDINE GLUCONATE 0.12 % MT SOLN
15.0000 mL | Freq: Once | OROMUCOSAL | Status: AC
  Administered 2019-10-17: 15 mL via OROMUCOSAL
  Filled 2019-10-17: qty 15

## 2019-10-17 MED ORDER — MIDAZOLAM HCL 2 MG/2ML IJ SOLN
INTRAMUSCULAR | Status: AC
  Administered 2019-10-17: 2 mg
  Filled 2019-10-17: qty 2

## 2019-10-17 MED ORDER — MEPERIDINE HCL 25 MG/ML IJ SOLN: 6.2500 mg | INTRAMUSCULAR | Status: DC | PRN

## 2019-10-17 MED ORDER — VANCOMYCIN HCL 1000 MG IV SOLR
INTRAVENOUS | Status: DC | PRN
  Administered 2019-10-17: 1000 mg

## 2019-10-17 MED ORDER — FENTANYL CITRATE (PF) 100 MCG/2ML IJ SOLN
INTRAMUSCULAR | Status: AC
  Filled 2019-10-17: qty 2

## 2019-10-17 MED ORDER — PROPOFOL 500 MG/50ML IV EMUL
INTRAVENOUS | Status: DC | PRN
  Administered 2019-10-17: 75 ug/kg/min via INTRAVENOUS

## 2019-10-17 MED ORDER — FENTANYL CITRATE (PF) 250 MCG/5ML IJ SOLN
INTRAMUSCULAR | Status: DC | PRN
  Administered 2019-10-17: 50 ug via INTRAVENOUS
  Administered 2019-10-17: 25 ug via INTRAVENOUS

## 2019-10-17 SURGICAL SUPPLY — 48 items
BNDG ESMARK 4X9 LF (GAUZE/BANDAGES/DRESSINGS) IMPLANT
BOWL SMART MIX CTS (DISPOSABLE) ×2 IMPLANT
BRUSH SCRUB EZ PLAIN DRY (MISCELLANEOUS) ×6 IMPLANT
CEMENT BONE SIMPLEX SPEEDSET (Cement) ×2 IMPLANT
COVER SURGICAL LIGHT HANDLE (MISCELLANEOUS) ×4 IMPLANT
COVER WAND RF STERILE (DRAPES) ×3 IMPLANT
CUFF TOURN SGL QUICK 18X4 (TOURNIQUET CUFF) ×2 IMPLANT
DRAPE C-ARM 42X72 X-RAY (DRAPES) ×2 IMPLANT
DRAPE C-ARMOR (DRAPES) ×3 IMPLANT
DRAPE EXTREMITY T 121X128X90 (DISPOSABLE) ×2 IMPLANT
DRAPE U-SHAPE 47X51 STRL (DRAPES) ×3 IMPLANT
DRSG VAC ATS MED SENSATRAC (GAUZE/BANDAGES/DRESSINGS) ×2 IMPLANT
ELECT REM PT RETURN 9FT ADLT (ELECTROSURGICAL) ×3
ELECTRODE REM PT RTRN 9FT ADLT (ELECTROSURGICAL) ×1 IMPLANT
GLOVE BIO SURGEON STRL SZ7.5 (GLOVE) ×3 IMPLANT
GLOVE BIO SURGEON STRL SZ8 (GLOVE) ×3 IMPLANT
GLOVE BIOGEL PI IND STRL 7.5 (GLOVE) ×1 IMPLANT
GLOVE BIOGEL PI IND STRL 8 (GLOVE) ×1 IMPLANT
GLOVE BIOGEL PI INDICATOR 7.5 (GLOVE) ×2
GLOVE BIOGEL PI INDICATOR 8 (GLOVE) ×2
GOWN STRL REUS W/ TWL LRG LVL3 (GOWN DISPOSABLE) ×2 IMPLANT
GOWN STRL REUS W/ TWL XL LVL3 (GOWN DISPOSABLE) ×1 IMPLANT
GOWN STRL REUS W/TWL LRG LVL3 (GOWN DISPOSABLE) ×6
GOWN STRL REUS W/TWL XL LVL3 (GOWN DISPOSABLE) ×3
JET LAVAGE IRRISEPT WOUND (IRRIGATION / IRRIGATOR) ×3
KIT BASIN OR (CUSTOM PROCEDURE TRAY) ×3 IMPLANT
KIT TURNOVER KIT B (KITS) ×3 IMPLANT
LAVAGE JET IRRISEPT WOUND (IRRIGATION / IRRIGATOR) IMPLANT
MANIFOLD NEPTUNE II (INSTRUMENTS) ×3 IMPLANT
NS IRRIG 1000ML POUR BTL (IV SOLUTION) ×3 IMPLANT
PACK GENERAL/GYN (CUSTOM PROCEDURE TRAY) ×2 IMPLANT
PIN COMPACT APEX 5038 5 080 (PIN) ×6 IMPLANT
PIN TO ROD COUPLING (PIN) ×8 IMPLANT
ROD COMPACT CARBON 5049 5 520 (Rod) ×4 IMPLANT
SOL PREP POV-IOD 4OZ 10% (MISCELLANEOUS) ×6 IMPLANT
STAPLER VISISTAT 35W (STAPLE) ×2 IMPLANT
SUCTION FRAZIER HANDLE 10FR (MISCELLANEOUS) ×3
SUCTION TUBE FRAZIER 10FR DISP (MISCELLANEOUS) IMPLANT
SUT ETHILON 3 0 PS 1 (SUTURE) ×2 IMPLANT
SUT MNCRL AB 3-0 PS2 27 (SUTURE) ×2 IMPLANT
SUT VIC AB 0 CT1 27 (SUTURE) ×3
SUT VIC AB 0 CT1 27XBRD ANBCTR (SUTURE) IMPLANT
TOWEL GREEN STERILE (TOWEL DISPOSABLE) ×6 IMPLANT
TOWEL GREEN STERILE FF (TOWEL DISPOSABLE) ×6 IMPLANT
TUBE CONNECTING 12'X1/4 (SUCTIONS) ×1
TUBE CONNECTING 12X1/4 (SUCTIONS) ×2 IMPLANT
UNDERPAD 30X36 HEAVY ABSORB (UNDERPADS AND DIAPERS) ×3 IMPLANT
YANKAUER SUCT BULB TIP NO VENT (SUCTIONS) ×3 IMPLANT

## 2019-10-17 NOTE — Transfer of Care (Signed)
Immediate Anesthesia Transfer of Care Note  Patient: Alexis Ball  Procedure(s) Performed: Debridement and Irrigation of Left Foot; Removal of Silicone Implant; Insertion of Antibiotic Spacer; Application of External Fixator; Possible Application of Wound VAC (Left ) Application External Fixation (Left )  Patient Location: PACU  Anesthesia Type:MAC and Regional  Level of Consciousness: awake, alert , oriented and patient cooperative  Airway & Oxygen Therapy: Patient Spontanous Breathing and Patient connected to nasal cannula oxygen  Post-op Assessment: Report given to RN and Post -op Vital signs reviewed and stable  Post vital signs: Reviewed and stable  Last Vitals:  Vitals Value Taken Time  BP 150/81 10/17/19 1823  Temp 36.2 C 10/17/19 1823  Pulse 61 10/17/19 1826  Resp 16 10/17/19 1826  SpO2 99 % 10/17/19 1826  Vitals shown include unvalidated device data.  Last Pain:  Vitals:   10/17/19 1823  TempSrc:   PainSc: 9       Patients Stated Pain Goal: 3 (57/84/69 6295)  Complications: No complications documented.

## 2019-10-17 NOTE — Progress Notes (Signed)
Arrived to discuss PICC placement with patient/family. Just arrived from PACU to room 5N07. Patient requested waiting until 10-18-19 AM for placement. Primary RN notified.

## 2019-10-17 NOTE — Plan of Care (Signed)

## 2019-10-17 NOTE — Interval H&P Note (Signed)
History and Physical Interval Note:  10/17/2019 4:21 PM  Alexis Ball  has presented today for surgery, with the diagnosis of Osteomyelitis; Infection of Hardware.  The various methods of treatment have been discussed with the patient and family. After consideration of risks, benefits and other options for treatment, the patient has consented to  Procedure(s) with comments: Debridement and Irrigation of Left Foot; Removal of Silicone Implant; Insertion of Antibiotic Spacer; Application of External Fixator; Possible Application of Wound VAC (Left) - Pre-op Block POP/SAPH; MAC Application External Fixation (Left) as a surgical intervention.  The patient's history has been reviewed, patient examined, no change in status, stable for surgery.  I have reviewed the patient's chart and labs.  Questions were answered to the patient's satisfaction.     Evelina Bucy

## 2019-10-17 NOTE — Plan of Care (Signed)

## 2019-10-17 NOTE — Op Note (Signed)
Patient Name: Alexis Ball DOB: [birth year]  MRN: [id number]   Date of Service: 10/19/19   Surgeon: Dr. Hardie Pulley, DPM Assistants: None Pre-operative Diagnosis:  Osteomyelitis, abscess of left foot, Hardware infection Post-operative Diagnosis:  Same Procedures:  1) Debridement and irrigation of left foot wound  2) Removal of Hardware  3) Application of Multiplanar External Fixator  4) Insertion of Abx delivery Device  5) Application wound VAC Pathology/Specimens: * No specimens in log * Anesthesia: MAC, Regional Hemostasis: * No tourniquets in log * Estimated Blood Loss: 30 mL Materials:  Implant Name Type Inv. Item Serial No. Manufacturer Lot No. LRB No. Used Action  CEMENT BONE SIMPLEX SPEEDSET - [id number] Cement CEMENT BONE SIMPLEX SPEEDSET  [employer] [id number] Left 1 Implanted  ROD COMPACT CARBON [id number] - [id number] Rod ROD COMPACT CARBON [id number]  [employer]  Left 2 Temporary Fixation (Not Implanted)  4x bar to rod couplers 54m Apex pins x1 265mApex pins x2  Medications: 1g Vancomycin as part of antibiotic spacer Complications: none  Indications for Procedure:  This is a [age].o. female with a postoperative infection involving abscess and infection of the hardware.  It was discussed that best treatment for this would be amputation of the toe with treatment of an infection however she refused amputation and wanted a salvage option.  We discussed removal of the implant with application of external fixator to span the deficit with insertion of antibiotic spacer.   Procedure in Detail: Patient was identified in pre-operative holding area. Formal consent was signed and the left lower extremity was marked. Patient was brought back to the operating room. Anesthesia was induced. The extremity was prepped and draped in the usual sterile fashion. Timeout was taken to confirm patient name, laterality, and procedure prior to incision.   Attention was then  directed to the left foot.  There was a small amount of purulence in the first metatarsal incision directly overlying the previous implanted joint.  The second metatarsal wound had some wound necrosis but no continued purulence. Both wounds were irrigated with 3 L of normal saline via pulse lavage.  At this point the [facility] was removed with a hemostat.  Each side of the metatarsal phalangeal joint was curettaged to hard bleeding bone.  The wound was further irrigated with 3 L normal saline.  The wounds were sharply excisionally debrided with a 15 blade to bleeding viable wound beds  Irrisept solution was further used to irrigate both wounds.  At this point preparation was made for the external fixator to hold the toe at length and keep the joint stable.  A 2 mm pin was placed in the distal aspect of the distal phalangeal joint just proximal to the nailbed.  Fluoroscopy was used to confirm positioning.  A 84m59min was placed in the medial cuneiform and a carbon fiber rod was affixed to the pins with pin to bar connectors to hold the toe at length.  Fluoroscopy was used to confirm positioning.  An additional 2mm39mex pin was fired into the intermediate cuneiform.  This was affixed to the pin at the distal phalanx with an additional carbon fiber rod and 2 pin to bar connectors.  The toe was held at adequate length and the couplers were tightened.  The final construct was thus a multiplanar delta configuration and appeared stable with the toe held at length and stable. An antibiotic PMMA spacer with added Vancomycin was inserted into the wound deficit.  At this point all wounds were irrigated.   Post-debridement the lateral wound measured 2.5 x 4 x 1.5, the medial wound 2x6.5x0.5. Primary closure of the wounds were not possible and thus wound VAC was indicated. Adaptic was applied over the first metatarsal incision to protect the extensor tendon complex..  Transparent drape was applied over the wounds  and the dressing was pierced to excise out a portion of the drape over each wound to protect the periwound skin.  Black foam was applied to bridge the 2 wounds.  Additional draping was applied.  The VAC was set to 125 with good seal noted.   The foot was then dressed with xeroform,cast padding, ACE bandage. Patient tolerated the procedure well.   Disposition: Following a period of post-operative monitoring, patient will be transferred to the floor.

## 2019-10-17 NOTE — Anesthesia Preprocedure Evaluation (Signed)
Anesthesia Evaluation  Patient identified by MRN, date of birth, ID band Patient awake    Reviewed: Allergy & Precautions, NPO status , Patient's Chart, lab work & pertinent test results  Airway Mallampati: I  TM Distance: >3 FB Neck ROM: Full    Dental   Pulmonary    Pulmonary exam normal        Cardiovascular hypertension, Pt. on medications Normal cardiovascular exam     Neuro/Psych CVA    GI/Hepatic   Endo/Other    Renal/GU      Musculoskeletal   Abdominal   Peds  Hematology   Anesthesia Other Findings   Reproductive/Obstetrics                             Anesthesia Physical Anesthesia Plan  ASA: III  Anesthesia Plan: General   Post-op Pain Management:  Regional for Post-op pain   Induction:   PONV Risk Score and Plan: 3 and Ondansetron, Midazolam and Treatment may vary due to age or medical condition  Airway Management Planned: LMA  Additional Equipment:   Intra-op Plan:   Post-operative Plan: Extubation in OR  Informed Consent: I have reviewed the patients History and Physical, chart, labs and discussed the procedure including the risks, benefits and alternatives for the proposed anesthesia with the patient or authorized representative who has indicated his/her understanding and acceptance.       Plan Discussed with: CRNA and Surgeon  Anesthesia Plan Comments:         Anesthesia Quick Evaluation

## 2019-10-17 NOTE — Anesthesia Procedure Notes (Signed)
Procedure Name: MAC Date/Time: 10/17/2019 4:25 PM Performed by: Janace Litten, CRNA Pre-anesthesia Checklist: Patient identified, Emergency Drugs available, Suction available and Patient being monitored Patient Re-evaluated:Patient Re-evaluated prior to induction Oxygen Delivery Method: Simple face mask

## 2019-10-17 NOTE — Anesthesia Procedure Notes (Signed)
Anesthesia Regional Block: Popliteal block   Pre-Anesthetic Checklist: ,, timeout performed, Correct Patient, Correct Site, Correct Laterality, Correct Procedure, Correct Position, site marked, Risks and benefits discussed,  Surgical consent,  Pre-op evaluation,  At surgeon's request and post-op pain management  Laterality: Left  Prep: chloraprep       Needles:  Injection technique: Single-shot  Needle Type: Echogenic Stimulator Needle     Needle Length: 10cm  Needle Gauge: 21     Additional Needles:   Procedures:, nerve stimulator,,,,,,,   Nerve Stimulator or Paresthesia:  Response: 0.4 mA,   Additional Responses:   Narrative:  Start time: 10/17/2019 4:08 PM End time: 10/17/2019 4:18 PM Injection made incrementally with aspirations every 5 mL.  Performed by: Personally  Anesthesiologist: Lillia Abed, MD  Additional Notes: Monitors applied. Patient sedated. Sterile prep and drape,hand hygiene and sterile gloves were used. Relevant anatomy identified.Needle position confirmed.Local anesthetic injected incrementally after negative aspiration. Local anesthetic spread visualized around nerve(s). Vascular puncture avoided. No complications. Image printed for medical record.The patient tolerated the procedure well.  Additional Saphenous nerve block performed. 15cc Local Anesthetic mixture placed under ultrasonic guidance along the medio-inferior border of the Sartorious muscle 6 inches above the knee.  No Problems encountered.  Lillia Abed MD

## 2019-10-17 NOTE — Telephone Encounter (Signed)
Patient was scheduled for her POV#2 tomorrow but has been admitted to the hospital and is unsure when she will be released. Wanted to make sure the patient will be okay to wait until her next POV on 11/01/19 or should we arrange for someone to see her at hospital.   Please advise.

## 2019-10-17 NOTE — Plan of Care (Signed)
  Problem: Education: Goal: Knowledge of General Education information will improve Description: Including pain rating scale, medication(s)/side effects and non-pharmacologic comfort measures Outcome: Progressing   Problem: Health Behavior/Discharge Planning: Goal: Ability to manage health-related needs will improve Outcome: Progressing   Problem: Activity: Goal: Risk for activity intolerance will decrease Outcome: Progressing   Problem: Nutrition: Goal: Adequate nutrition will be maintained Outcome: Progressing   Problem: Coping: Goal: Level of anxiety will decrease Outcome: Progressing   Problem: Pain Managment: Goal: General experience of comfort will improve Outcome: Progressing   Problem: Skin Integrity: Goal: Risk for impaired skin integrity will decrease Outcome: Progressing   

## 2019-10-17 NOTE — Telephone Encounter (Signed)
Patient is being followed by Dr. March Rummage in the hospital. He will take over her care until further notice, per Dr. Milinda Pointer

## 2019-10-17 NOTE — Brief Op Note (Signed)
10/13/2019 - 10/17/2019  6:19 PM  PATIENT:  Alexis Ball  64 y.o. female  PRE-OPERATIVE DIAGNOSIS:  Osteomyelitis; Infection of Hardware  POST-OPERATIVE DIAGNOSIS:  Osteomyelitis; Infection of Hardware  PROCEDURE:  Procedure(s) with comments: Debridement and Irrigation of Left Foot; Removal of Silicone Implant; Insertion of Antibiotic Spacer; Application of External Fixator; Possible Application of Wound VAC (Left) - Pre-op Block POP/SAPH; MAC Application External Fixation (Left)  SURGEON:  Surgeon(s) and Role:    * Evelina Bucy, DPM - Primary  PHYSICIAN ASSISTANT:   ASSISTANTS: none   ANESTHESIA:   regional and IV sedation  EBL:  50 ml   BLOOD ADMINISTERED:none  DRAINS: none   LOCAL MEDICATIONS USED:  NONE  SPECIMEN:   * No specimens in log *   DISPOSITION OF SPECIMEN:  N/A  COUNTS:  YES  TOURNIQUET:  * No tourniquets in log *  DICTATION: .Dragon Dictation  PLAN OF CARE: transfer to floor  PATIENT DISPOSITION:  PACU - hemodynamically stable.   Delay start of Pharmacological VTE agent (>24hrs) due to surgical blood loss or risk of bleeding: yes

## 2019-10-17 NOTE — Progress Notes (Signed)
PROGRESS NOTE    Alexis Ball  JJO:[id number] DOB: [birth year] DOA: 10/13/2019 PCP: Lucianne Lei, MD     Brief Narrative:  64 y.o. BF PMHx HTN, remote CVA, and recentbunionectomy with silicone implant of her left foot on June 25.  Patient started to have increasing pain and drainage from thesurgicalwoundaftershe tripped and fell down on the foot last week. She went to podiatrist office 3 days ago, x-ray showed no significant displacement and of the implant and MTP joints, but there was a local infection going on, podiatrist ordered clindamycin plus Keflex regimen which she has been taking. But today, she noticed that the wound is "busted open"with maggotscoming out.     Subjective: Attempted to see Pt on two occasions remains in surgery. No charge    Assessment & Plan: Covid vaccination;   Active Problems:   Abscess of bursa of left foot   Foot abscess, left   Wound infection   Left forefoot abscess and cellulitis plus osteomyelitis,status post I&D on 7/4 by Dr. March Rummage -vancomycin and Zosyn for now while waiting for culture -per Dr. March Rummage: Discussed surgical options with patient. We did discuss amputation as resolution option for infection. Patient does not want amputation andelects for salvage option. In this case she will need removal of the implant with bone debridement/resection, insertion ofantibiotic spacer with application of external fixator. She will also need possible wound VAC for enhanced wound healing.I will do that after 6 weeks of IVs if the plan -She will likely need a PICC line for long-term antibiotic therapy -Recommend ID consult -I have placed ID consult to Dr. Linus Salmons who will see patient -I have ordered PICC Line  HTN -continue home regimen  Hypokalemia -replete  History of CVA -No acute issue, continue aspirin  Chronic iron deficiency anemia -On iron supplement, outpatient GI follow-up  obesity Body mass index is 32.06  kg/m.   DVT prophylaxis:  Code Status: Full Family Communication:  Status is: Inpatient    Dispo: The patient is from: Home              Anticipated d/c is to: Per surgery              Anticipated d/c date is: 7/14              Patient currently unstable in surgery      Consultants:  Podiatry    Procedures/Significant Events:    I have personally reviewed and interpreted all radiology studies and my findings are as above.  VENTILATOR SETTINGS:    Cultures   Antimicrobials: Anti-infectives (From admission, onward)   Start     Ordered Stop   10/16/19 1400  [MAR Hold]  ceFEPIme (MAXIPIME) 2 g in sodium chloride 0.9 % 100 mL IVPB     Discontinue     (MAR Hold since Wed 10/17/2019 at 1442.Hold Reason: Transfer to a Procedural area.)   10/16/19 1233     10/15/19 2100  vancomycin (VANCOCIN) IVPB 1000 mg/200 mL premix  Status:  Discontinued        10/15/19 1155 10/16/19 1233   10/15/19 0800  vancomycin (VANCOREADY) IVPB 750 mg/150 mL  Status:  Discontinued        10/14/19 1806 10/15/19 1155   10/14/19 1830  piperacillin-tazobactam (ZOSYN) IVPB 3.375 g  Status:  Discontinued        10/14/19 1806 10/16/19 1234   10/14/19 1800  vancomycin (VANCOREADY) IVPB 1500 mg/300 mL        10/14/19  1745 10/14/19 2051   10/14/19 1440  vancomycin (VANCOCIN) powder  Status:  Discontinued        10/14/19 1531 10/14/19 1534   10/14/19 1400  ceFAZolin (ANCEF) IVPB 1 g/50 mL premix  Status:  Discontinued        10/14/19 1135 10/14/19 1741   10/14/19 1000  doxycycline (VIBRA-TABS) tablet 100 mg  Status:  Discontinued        10/14/19 0840 10/14/19 1745   10/14/19 0630  piperacillin-tazobactam (ZOSYN) IVPB 3.375 g        10/14/19 0623 10/14/19 0736       Devices    LINES / TUBES:      Continuous Infusions: . [MAR Hold] ceFEPime (MAXIPIME) IV 2 g (10/17/19 0551)  . lactated ringers 10 mL/hr at 10/17/19 1628     Objective: Vitals:   10/16/19 2001 10/17/19 0508 10/17/19 0833  10/17/19 1452  BP: 113/84 (!) 147/75 (!) 174/82   Pulse: 69 64 64   Resp: 16 16 17    Temp: 98.8 F (37.1 C) 98.6 F (37 C) 98.7 F (37.1 C)   TempSrc: Oral Oral Oral   SpO2: 98% 93% 99%   Weight:    82.1 kg  Height:    5\' 3"  (1.6 m)    Intake/Output Summary (Last 24 hours) at 10/17/2019 1635 Last data filed at 10/17/2019 0300 Gross per 24 hour  Intake 200.07 ml  Output --  Net 200.07 ml   Filed Weights   10/13/19 2329 10/17/19 1452  Weight: 82.1 kg 82.1 kg    Examination:  Attempted to see Pt on two occasions remains in surgery. No charge  .     Data Reviewed: Care during the described time interval was provided by me .  I have reviewed this patient's available data, including medical history, events of note, physical examination, and all test results as part of my evaluation.  CBC: Recent Labs  Lab 10/14/19 0437 10/15/19 0356 10/16/19 0424  WBC 11.6* 11.0* 8.5  NEUTROABS 8.4*  --   --   HGB 11.9* 10.9* 10.1*  HCT 36.5 34.3* 31.9*  MCV 92.2 93.0 93.3  PLT 439* 425* 062   Basic Metabolic Panel: Recent Labs  Lab 10/14/19 0437 10/15/19 0356 10/16/19 0424  NA 137 138 138  K 4.0 3.9 3.3*  CL 100 103 104  CO2 28 26 24   GLUCOSE 102* 120* 116*  BUN 16 15 11   CREATININE 0.80 0.83 0.93  CALCIUM 9.4 8.7* 8.6*   GFR: Estimated Creatinine Clearance: 63.7 mL/min (by C-G formula based on SCr of 0.93 mg/dL). Liver Function Tests: No results for input(s): AST, ALT, ALKPHOS, BILITOT, PROT, ALBUMIN in the last 168 hours. No results for input(s): LIPASE, AMYLASE in the last 168 hours. No results for input(s): AMMONIA in the last 168 hours. Coagulation Profile: No results for input(s): INR, PROTIME in the last 168 hours. Cardiac Enzymes: No results for input(s): CKTOTAL, CKMB, CKMBINDEX, TROPONINI in the last 168 hours. BNP (last 3 results) No results for input(s): PROBNP in the last 8760 hours. HbA1C: No results for input(s): HGBA1C in the last 72 hours. CBG: No  results for input(s): GLUCAP in the last 168 hours. Lipid Profile: No results for input(s): CHOL, HDL, LDLCALC, TRIG, CHOLHDL, LDLDIRECT in the last 72 hours. Thyroid Function Tests: No results for input(s): TSH, T4TOTAL, FREET4, T3FREE, THYROIDAB in the last 72 hours. Anemia Panel: No results for input(s): VITAMINB12, FOLATE, FERRITIN, TIBC, IRON, RETICCTPCT in the last 72 hours.  Sepsis Labs: No results for input(s): PROCALCITON, LATICACIDVEN in the last 168 hours.  Recent Results (from the past 240 hour(s))  SARS Coronavirus 2 by RT PCR (hospital order, performed in [facility] hospital lab) Nasopharyngeal Nasopharyngeal Swab     Status: None   Collection Time: 10/14/19  4:46 AM   Specimen: Nasopharyngeal Swab  Result Value Ref Range Status   SARS Coronavirus 2 NEGATIVE NEGATIVE Final    Comment: (NOTE) SARS-CoV-2 target nucleic acids are NOT DETECTED.  The SARS-CoV-2 RNA is generally detectable in upper and lower respiratory specimens during the acute phase of infection. The lowest concentration of SARS-CoV-2 viral copies this assay can detect is 250 copies / mL. A negative result does not preclude SARS-CoV-2 infection and should not be used as the sole basis for treatment or other patient management decisions.  A negative result may occur with improper specimen collection / handling, submission of specimen other than nasopharyngeal swab, presence of viral mutation(s) within the areas targeted by this assay, and inadequate number of viral copies (<250 copies / mL). A negative result must be combined with clinical observations, patient history, and epidemiological information.  Fact Sheet for Patients:   StrictlyIdeas.no  Fact Sheet for Healthcare Providers: BankingDealers.co.za  This test is not yet approved or  cleared by the [place] FDA and has been authorized for detection and/or diagnosis of SARS-CoV-2 by FDA under an  Emergency Use Authorization (EUA).  This EUA will remain in effect (meaning this test can be used) for the duration of the COVID-19 declaration under Section 564(b)(1) of the Act, 21 U.S.C. section 360bbb-3(b)(1), unless the authorization is terminated or revoked sooner.  Performed at [facility], [place] [address] [place], [place] [address]   Aerobic/Anaerobic Culture (surgical/deep wound)     Status: None (Preliminary result)   Collection Time: 10/14/19  2:37 PM   Specimen: Soft Tissue, Other; Body Fluid  Result Value Ref Range Status   Specimen Description TISSUE SWAB  Final   Special Requests 2ND LEFTFOOT  Final   Gram Stain   Final    NO WBC SEEN NO ORGANISMS SEEN Performed at [facility], [address], [place], [place] [address]    Culture   Final    FEW ENTEROBACTER CLOACAE NO ANAEROBES ISOLATED; CULTURE IN PROGRESS FOR 5 DAYS    Report Status PENDING  Incomplete   Organism ID, Bacteria ENTEROBACTER CLOACAE  Final      Susceptibility   Enterobacter cloacae - MIC*    CEFAZOLIN >=64 RESISTANT Resistant     CEFEPIME <=0.12 SENSITIVE Sensitive     CEFTAZIDIME <=1 SENSITIVE Sensitive     CIPROFLOXACIN <=0.25 SENSITIVE Sensitive     GENTAMICIN <=1 SENSITIVE Sensitive     IMIPENEM 0.5 SENSITIVE Sensitive     TRIMETH/SULFA <=20 SENSITIVE Sensitive     PIP/TAZO <=4 SENSITIVE Sensitive     * FEW ENTEROBACTER CLOACAE  Aerobic/Anaerobic Culture (surgical/deep wound)     Status: None   Collection Time: 10/14/19  2:42 PM   Specimen: Soft Tissue, Other; Body Fluid  Result Value Ref Range Status   Specimen Description BONE  Final   Special Requests PROXIMAL PHALANX LEFT  Final   Gram Stain   Final    RARE WBC PRESENT, PREDOMINANTLY PMN RARE GRAM POSITIVE COCCI RARE GRAM VARIABLE ROD Performed at [facility], [place] [address] [place], [place] [address]    Culture FEW ENTEROBACTER [place]   Final  Report Status 10/17/2019 FINAL  Final    Organism ID, Bacteria ENTEROBACTER CLOACAE  Final      Susceptibility   Enterobacter cloacae - MIC*    CEFAZOLIN >=64 RESISTANT Resistant     CEFEPIME <=0.12 SENSITIVE Sensitive     CEFTAZIDIME <=1 SENSITIVE Sensitive     CIPROFLOXACIN <=0.25 SENSITIVE Sensitive     GENTAMICIN <=1 SENSITIVE Sensitive     IMIPENEM 0.5 SENSITIVE Sensitive     TRIMETH/SULFA <=20 SENSITIVE Sensitive     PIP/TAZO <=4 SENSITIVE Sensitive     * FEW ENTEROBACTER CLOACAE  Aerobic/Anaerobic Culture (surgical/deep wound)     Status: None (Preliminary result)   Collection Time: 10/14/19  2:43 PM   Specimen: Other Source; Tissue  Result Value Ref Range Status   Specimen Description TISSUE SWAB  Final   Special Requests 2ND INTERSPACE  Final   Gram Stain   Final    RARE WBC PRESENT, PREDOMINANTLY PMN NO ORGANISMS SEEN Performed at [facility], [place] [address] [place], [place] [address]    Culture   Final    FEW ENTEROBACTER CLOACAE NO ANAEROBES ISOLATED; CULTURE IN PROGRESS FOR 5 DAYS    Report Status PENDING  Incomplete   Organism ID, Bacteria ENTEROBACTER CLOACAE  Final      Susceptibility   Enterobacter cloacae - MIC*    CEFAZOLIN >=64 RESISTANT Resistant     CEFEPIME <=0.12 SENSITIVE Sensitive     CEFTAZIDIME <=1 SENSITIVE Sensitive     CIPROFLOXACIN <=0.25 SENSITIVE Sensitive     GENTAMICIN <=1 SENSITIVE Sensitive     IMIPENEM 0.5 SENSITIVE Sensitive     TRIMETH/SULFA <=20 SENSITIVE Sensitive     PIP/TAZO <=4 SENSITIVE Sensitive     * FEW ENTEROBACTER CLOACAE  Surgical pcr screen     Status: None   Collection Time: 10/16/19  6:42 PM   Specimen: Nasal Mucosa; Nasal Swab  Result Value Ref Range Status   MRSA, PCR NEGATIVE NEGATIVE Final   Staphylococcus aureus NEGATIVE NEGATIVE Final    Comment: (NOTE) The Xpert SA Assay (FDA approved for NASAL specimens in patients 76 years of age and older), is one component of a comprehensive surveillance program. It is not intended to diagnose  infection nor to guide or monitor treatment. Performed at [facility], [place] [address], [place], [place] [address]          Radiology Studies: [place] EKG SITE RITE  Result Date: 10/17/2019 If Site Rite image not attached, placement could not be confirmed due to current cardiac rhythm.  [place] EKG SITE RITE  Result Date: 10/16/2019 If Site Rite image not attached, placement could not be confirmed due to current cardiac rhythm.       Scheduled Meds: . [MAR Hold] aspirin EC  81 mg Oral Daily  . [MAR Hold] brimonidine  1 drop Both Eyes BID  . [MAR Hold] dorzolamide-timolol  1 drop Both Eyes BID  . [MAR Hold] enoxaparin (LOVENOX) injection  40 mg Subcutaneous Q24H  . [MAR Hold] ferrous sulfate  325 mg Oral Q breakfast  . [MAR Hold] gabapentin  300 mg Oral q morning - 10a   And  . [MAR Hold] gabapentin  600 mg Oral QHS  . [MAR Hold] latanoprost  1 drop Both Eyes QHS  . [MAR Hold] multivitamin with minerals  1 tablet Oral Daily  . [MAR Hold] omega-3 acid ethyl esters  1 g Oral Daily   Continuous Infusions: . [MAR Hold] ceFEPime (MAXIPIME) IV 2  g (10/17/19 0551)  . lactated ringers 10 mL/hr at 10/17/19 1628     LOS: 3 days    Time spent:40 min    Nyna Chilton, Geraldo Docker, MD [facility] Pager [phone]  If 7PM-7AM, please contact night-coverage [email or web address] Password [facility] 10/17/2019, 4:35 PM

## 2019-10-17 NOTE — Anesthesia Postprocedure Evaluation (Signed)
Anesthesia Post Note  Patient: Alexis Ball  Procedure(s) Performed: Debridement and Irrigation of Left Foot; Removal of Silicone Implant; Insertion of Antibiotic Spacer; Application of External Fixator; Possible Application of Wound VAC (Left ) Application External Fixation (Left )     Anesthesia Type: Regional   No complications documented.  Last Vitals:  Vitals:   10/17/19 1923 10/17/19 2010  BP: (!) 155/72 (!) 158/86  Pulse: (!) 54 (!) 57  Resp: 20 16  Temp: 36.5 C (!) 36.3 C  SpO2: 100% 99%    Last Pain:  Vitals:   10/17/19 2100  TempSrc:   PainSc: 0-No pain                 Nolon Nations

## 2019-10-18 ENCOUNTER — Encounter: Payer: Medicare HMO | Admitting: Podiatry

## 2019-10-18 ENCOUNTER — Encounter (HOSPITAL_COMMUNITY): Payer: Self-pay | Admitting: Podiatry

## 2019-10-18 DIAGNOSIS — M86172 Other acute osteomyelitis, left ankle and foot: Secondary | ICD-10-CM | POA: Diagnosis not present

## 2019-10-18 DIAGNOSIS — L02612 Cutaneous abscess of left foot: Secondary | ICD-10-CM | POA: Diagnosis not present

## 2019-10-18 DIAGNOSIS — B952 Enterococcus as the cause of diseases classified elsewhere: Secondary | ICD-10-CM

## 2019-10-18 DIAGNOSIS — M71072 Abscess of bursa, left ankle and foot: Secondary | ICD-10-CM | POA: Diagnosis not present

## 2019-10-18 LAB — COMPREHENSIVE METABOLIC PANEL
ALT: 21 U/L (ref 0–44)
AST: 24 U/L (ref 15–41)
Albumin: 2.4 g/dL — ABNORMAL LOW (ref 3.5–5.0)
Alkaline Phosphatase: 59 U/L (ref 38–126)
Anion gap: 7 (ref 5–15)
BUN: 9 mg/dL (ref 8–23)
CO2: 24 mmol/L (ref 22–32)
Calcium: 8.7 mg/dL — ABNORMAL LOW (ref 8.9–10.3)
Chloride: 108 mmol/L (ref 98–111)
Creatinine, Ser: 0.79 mg/dL (ref 0.44–1.00)
GFR calc Af Amer: >60 mL/min (ref >60)
GFR calc non Af Amer: >60 mL/min (ref >60)
Glucose, Bld: 109 mg/dL — ABNORMAL HIGH (ref 70–99)
Potassium: 3.8 mmol/L (ref 3.5–5.1)
Sodium: 139 mmol/L (ref 135–145)
Total Bilirubin: 0.4 mg/dL (ref 0.3–1.2)
Total Protein: 5.9 g/dL — ABNORMAL LOW (ref 6.5–8.1)

## 2019-10-18 LAB — CBC WITH DIFFERENTIAL/PLATELET
Abs Immature Granulocytes: 0.08 10*3/uL — ABNORMAL HIGH (ref 0.00–0.07)
Basophils Absolute: 0 10*3/uL (ref 0.0–0.1)
Basophils Relative: 0 %
Eosinophils Absolute: 0.2 10*3/uL (ref 0.0–0.5)
Eosinophils Relative: 2 %
HCT: 31.5 % — ABNORMAL LOW (ref 36.0–46.0)
Hemoglobin: 10.1 g/dL — ABNORMAL LOW (ref 12.0–15.0)
Immature Granulocytes: 1 %
Lymphocytes Relative: 22 %
Lymphs Abs: 2.3 10*3/uL (ref 0.7–4.0)
MCH: 29.5 pg (ref 26.0–34.0)
MCHC: 32.1 g/dL (ref 30.0–36.0)
MCV: 92.1 fL (ref 80.0–100.0)
Monocytes Absolute: 0.7 10*3/uL (ref 0.1–1.0)
Monocytes Relative: 7 %
Neutro Abs: 7.3 10*3/uL (ref 1.7–7.7)
Neutrophils Relative %: 68 %
Platelets: 363 10*3/uL (ref 150–400)
RBC: 3.42 MIL/uL — ABNORMAL LOW (ref 3.87–5.11)
RDW: 12.3 % (ref 11.5–15.5)
WBC: 10.6 10*3/uL — ABNORMAL HIGH (ref 4.0–10.5)
nRBC: 0 % (ref 0.0–0.2)

## 2019-10-18 LAB — PHOSPHORUS: Phosphorus: 2.4 mg/dL — ABNORMAL LOW (ref 2.5–4.6)

## 2019-10-18 LAB — MAGNESIUM: Magnesium: 1.7 mg/dL (ref 1.7–2.4)

## 2019-10-18 MED ORDER — SODIUM CHLORIDE 0.9% FLUSH
10.0000 mL | Freq: Two times a day (BID) | INTRAVENOUS | Status: DC
  Administered 2019-10-18 – 2019-11-09 (×23): 10 mL

## 2019-10-18 MED ORDER — CHLORHEXIDINE GLUCONATE CLOTH 2 % EX PADS
6.0000 | MEDICATED_PAD | Freq: Every day | CUTANEOUS | Status: DC
  Administered 2019-10-18 – 2019-11-09 (×23): 6 via TOPICAL

## 2019-10-18 MED ORDER — SODIUM CHLORIDE 0.9% FLUSH
10.0000 mL | INTRAVENOUS | Status: DC | PRN
  Administered 2019-11-09: 10 mL

## 2019-10-18 MED ORDER — SODIUM CHLORIDE 0.9 % IV SOLN
1.0000 g | INTRAVENOUS | Status: DC
  Administered 2019-10-18 – 2019-11-09 (×23): 1000 mg via INTRAVENOUS
  Filled 2019-10-18 (×25): qty 1

## 2019-10-18 NOTE — Progress Notes (Signed)
Peripherally Inserted Central Catheter Placement  The IV Nurse has discussed with the patient and/or persons authorized to consent for the patient, the purpose of this procedure and the potential benefits and risks involved with this procedure.  The benefits include less needle sticks, lab draws from the catheter, and the patient may be discharged home with the catheter. Risks include, but not limited to, infection, bleeding, blood clot (thrombus formation), and puncture of an artery; nerve damage and irregular heartbeat and possibility to perform a PICC exchange if needed/ordered by physician.  Alternatives to this procedure were also discussed.  Bard Power PICC patient education guide, fact sheet on infection prevention and patient information card has been provided to patient /or left at bedside.    PICC Placement Documentation  PICC Single Lumen 10/18/19 PICC Right Cephalic 37 cm 1 cm (Active)  Indication for Insertion or Continuance of Line Home intravenous therapies (PICC only) 10/18/19 0943  Exposed Catheter (cm) 1 cm 10/18/19 0943  Site Assessment Clean;Dry;Intact 10/18/19 0943  Line Status Flushed;Saline locked;Blood return noted 10/18/19 0943  Dressing Type Transparent;Securing device 10/18/19 0943  Dressing Status Clean;Dry;Intact;Antimicrobial disc in place 10/18/19 0943  Dressing Intervention New dressing 10/18/19 0943  Dressing Change Due 10/25/19 10/18/19 Lucky 10/18/2019, 9:45 AM

## 2019-10-18 NOTE — Progress Notes (Signed)
PHARMACY CONSULT NOTE FOR:  OUTPATIENT  PARENTERAL ANTIBIOTIC THERAPY (OPAT)  Indication: Osteomyelitis of left foot Regimen: Ertapenem 1 gm every 24 hours End date: 11/28/19  IV antibiotic discharge orders are pended. To discharging provider:  please sign these orders via discharge navigator,  Select New Orders & click on the button choice - Manage This Unsigned Work.     Thank you for allowing pharmacy to be a part of this patient's care.  Jimmy Footman, PharmD, BCPS, BCIDP Infectious Diseases Clinical Pharmacist Phone: [phone] 10/18/2019, 10:29 AM

## 2019-10-18 NOTE — Progress Notes (Signed)
PROGRESS NOTE    Alexis Ball  OEV:[id number] DOB: 1955/04/24 DOA: 10/13/2019 PCP: Lucianne Lei, MD     Brief Narrative:  64 y.o. BF PMHx HTN, remote CVA, and recentbunionectomy with silicone implant of her left foot on June 25.  Patient started to have increasing pain and drainage from thesurgicalwoundaftershe tripped and fell down on the foot last week. She went to podiatrist office 3 days ago, x-ray showed no significant displacement and of the implant and MTP joints, but there was a local infection going on, podiatrist ordered clindamycin plus Keflex regimen which she has been taking. But today, she noticed that the wound is "busted open"with maggotscoming out.     Subjective: 7/8 A/O x4, negative S OB, negative CP, positive extreme LEFT foot pain   Assessment & Plan: Covid vaccination;   Active Problems:   Abscess of bursa of left foot   Foot abscess, left   Wound infection  LEFT forefoot abscess/cellulitis/osteomyelitis -7/4 s/p I&D by Dr. March Rummage -7/7 s/p LEFT foot ORIF see results below -ID recommends 6 weeks of IV cefepime after amputation. -OPATorders and home health vs SNF -ID will follow up with in office. -7/8 DC cefepime---> Entapenem  HTN -Patient BP currently controlled off medication.   -Continue to monitor closely  Hypokalemia -Resolved  History of CVA -No acute issue, continue aspirin  Chronic iron deficiency anemia -On iron supplement, outpatient GI follow-up  Obese Body mass index is 32.06 kg/m.   DVT prophylaxis:  Code Status: Full Family Communication: 7/8 daughters present at bedside for discussion of plan of care Status is: Inpatient    Dispo: The patient is from: Home              Anticipated d/c is to: Per surgery              Anticipated d/c date is: 7/14              Patient currently unstable in surgery      Consultants:  Podiatry  ID   Procedures/Significant Events:  7/4 s/p I&D LEFT foot with  bone biopsy by Dr. March Rummage 7/7Left Foot; Removal of Silicone Implant; Insertion of Antibiotic Spacer; Application of External Fixator; Possible Application of Wound VAC (Left)    I have personally reviewed and interpreted all radiology studies and my findings are as above.  VENTILATOR SETTINGS:    Cultures 7/4 LEFT foot positive Enterobacter Cloacae 7/4 LEFT proximal phalanx positive Enterobacter Cloacae, FINEGOLDIA MAGNA 7/4 she is well second interspace positive Enterobacter Cloacae 7/4 MRSA by PCR negative 7/4 staph aureus negative 7/4 SARS coronavirus negative     Antimicrobials: Anti-infectives (From admission, onward)   Start     Ordered Stop   10/18/19 1400  ertapenem (INVANZ) 1,000 mg in sodium chloride 0.9 % 100 mL IVPB     Discontinue     10/18/19 0909     10/17/19 1739  vancomycin (VANCOCIN) powder  Status:  Discontinued        10/17/19 1740 10/17/19 1820   10/16/19 1400  ceFEPIme (MAXIPIME) 2 g in sodium chloride 0.9 % 100 mL IVPB  Status:  Discontinued        10/16/19 1233 10/18/19 0909   10/15/19 2100  vancomycin (VANCOCIN) IVPB 1000 mg/200 mL premix  Status:  Discontinued        10/15/19 1155 10/16/19 1233   10/15/19 0800  vancomycin (VANCOREADY) IVPB 750 mg/150 mL  Status:  Discontinued        10/14/19  1806 10/15/19 1155   10/14/19 1830  piperacillin-tazobactam (ZOSYN) IVPB 3.375 g  Status:  Discontinued        10/14/19 1806 10/16/19 1234   10/14/19 1800  vancomycin (VANCOREADY) IVPB 1500 mg/300 mL        10/14/19 1745 10/14/19 2051   10/14/19 1440  vancomycin (VANCOCIN) powder  Status:  Discontinued        10/14/19 1531 10/14/19 1534   10/14/19 1400  ceFAZolin (ANCEF) IVPB 1 g/50 mL premix  Status:  Discontinued        10/14/19 1135 10/14/19 1741   10/14/19 1000  doxycycline (VIBRA-TABS) tablet 100 mg  Status:  Discontinued        10/14/19 0840 10/14/19 1745   10/14/19 0630  piperacillin-tazobactam (ZOSYN) IVPB 3.375 g        10/14/19 0623 10/14/19 0736         Devices    LINES / TUBES:  Right arm PICC line>>>    Continuous Infusions: . ertapenem       Objective: Vitals:   10/17/19 2010 10/18/19 0016 10/18/19 0412 10/18/19 1032  BP: (!) 158/86 135/74 128/71 (!) 141/103  Pulse: (!) 57 66 83 82  Resp: 16 15 18 18   Temp: (!) 97.4 F (36.3 C) 98.5 F (36.9 C) (!) 97.5 F (36.4 C) 98.6 F (37 C)  TempSrc: Oral Oral Oral   SpO2: 99% 98% 98% 99%  Weight:      Height:        Intake/Output Summary (Last 24 hours) at 10/18/2019 1337 Last data filed at 10/18/2019 0400 Gross per 24 hour  Intake 800 ml  Output 395 ml  Net 405 ml   Filed Weights   10/13/19 2329 10/17/19 1452  Weight: 82.1 kg 82.1 kg    Examination:  General: A/O x4, No acute respiratory distress, extremely uncomfortable from pain LEFT foot Eyes: negative scleral hemorrhage, negative anisocoria, negative icterus ENT: Negative Runny nose, negative gingival bleeding, Neck:  Negative scars, masses, torticollis, lymphadenopathy, JVD Lungs: Clear to auscultation bilaterally without wheezes or crackles Cardiovascular: Regular rate and rhythm without murmur gallop or rub normal S1 and S2 Abdomen: negative abdominal pain, nondistended, positive soft, bowel sounds, no rebound, no ascites, no appreciable mass Extremities: LEFT foot ORIF and Ace wrap with wound VAC in place. Minimal serosanguineous fluid being suctioned by wound VAC. Skin: Negative rashes, lesions, ulcers Psychiatric:  Negative depression, negative anxiety, negative fatigue, negative mania  Central nervous system:  Cranial nerves II through XII intact, tongue/uvula midline, all extremities muscle strength 5/5, sensation intact throughout, negative dysarthria, negative expressive aphasia, negative receptive aphasia.   Data Reviewed: Care during the described time interval was provided by me .  I have reviewed this patient's available data, including medical history, events of note, physical examination,  and all test results as part of my evaluation.  CBC: Recent Labs  Lab 10/14/19 0437 10/15/19 0356 10/16/19 0424 10/18/19 1128  WBC 11.6* 11.0* 8.5 10.6*  NEUTROABS 8.4*  --   --  7.3  HGB 11.9* 10.9* 10.1* 10.1*  HCT 36.5 34.3* 31.9* 31.5*  MCV 92.2 93.0 93.3 92.1  PLT 439* 425* 376 315   Basic Metabolic Panel: Recent Labs  Lab 10/14/19 0437 10/15/19 0356 10/16/19 0424 10/18/19 1128  NA 137 138 138 139  K 4.0 3.9 3.3* 3.8  CL 100 103 104 108  CO2 28 26 24 24   GLUCOSE 102* 120* 116* 109*  BUN 16 15 11 9   CREATININE 0.80  0.83 0.93 0.79  CALCIUM 9.4 8.7* 8.6* 8.7*  MG  --   --   --  1.7  PHOS  --   --   --  2.4*   GFR: Estimated Creatinine Clearance: 74 mL/min (by C-G formula based on SCr of 0.79 mg/dL). Liver Function Tests: Recent Labs  Lab 10/18/19 1128  AST 24  ALT 21  ALKPHOS 59  BILITOT 0.4  PROT 5.9*  ALBUMIN 2.4*   No results for input(s): LIPASE, AMYLASE in the last 168 hours. No results for input(s): AMMONIA in the last 168 hours. Coagulation Profile: No results for input(s): INR, PROTIME in the last 168 hours. Cardiac Enzymes: No results for input(s): CKTOTAL, CKMB, CKMBINDEX, TROPONINI in the last 168 hours. BNP (last 3 results) No results for input(s): PROBNP in the last 8760 hours. HbA1C: No results for input(s): HGBA1C in the last 72 hours. CBG: No results for input(s): GLUCAP in the last 168 hours. Lipid Profile: No results for input(s): CHOL, HDL, LDLCALC, TRIG, CHOLHDL, LDLDIRECT in the last 72 hours. Thyroid Function Tests: No results for input(s): TSH, T4TOTAL, FREET4, T3FREE, THYROIDAB in the last 72 hours. Anemia Panel: No results for input(s): VITAMINB12, FOLATE, FERRITIN, TIBC, IRON, RETICCTPCT in the last 72 hours. Sepsis Labs: No results for input(s): PROCALCITON, LATICACIDVEN in the last 168 hours.  Recent Results (from the past 240 hour(s))  SARS Coronavirus 2 by RT PCR (hospital order, performed in [facility] hospital  lab) Nasopharyngeal Nasopharyngeal Swab     Status: None   Collection Time: 10/14/19  4:46 AM   Specimen: Nasopharyngeal Swab  Result Value Ref Range Status   SARS Coronavirus 2 NEGATIVE NEGATIVE Final    Comment: (NOTE) SARS-CoV-2 target nucleic acids are NOT DETECTED.  The SARS-CoV-2 RNA is generally detectable in upper and lower respiratory specimens during the acute phase of infection. The lowest concentration of SARS-CoV-2 viral copies this assay can detect is 250 copies / mL. A negative result does not preclude SARS-CoV-2 infection and should not be used as the sole basis for treatment or other patient management decisions.  A negative result may occur with improper specimen collection / handling, submission of specimen other than nasopharyngeal swab, presence of viral mutation(s) within the areas targeted by this assay, and inadequate number of viral copies (<250 copies / mL). A negative result must be combined with clinical observations, patient history, and epidemiological information.  Fact Sheet for Patients:   StrictlyIdeas.no  Fact Sheet for Healthcare Providers: BankingDealers.co.za  This test is not yet approved or  cleared by the [place] FDA and has been authorized for detection and/or diagnosis of SARS-CoV-2 by FDA under an Emergency Use Authorization (EUA).  This EUA will remain in effect (meaning this test can be used) for the duration of the COVID-19 declaration under Section 564(b)(1) of the Act, 21 U.S.C. section 360bbb-3(b)(1), unless the authorization is terminated or revoked sooner.  Performed at [facility], [place] [address], [place], [place] [address]   Aerobic/Anaerobic Culture (surgical/deep wound)     Status: None (Preliminary result)   Collection Time: 10/14/19  2:37 PM   Specimen: Soft Tissue, Other; Body Fluid  Result Value Ref Range Status   Specimen Description TISSUE SWAB  Final    Special Requests 2ND LEFTFOOT  Final   Gram Stain   Final    NO WBC SEEN NO ORGANISMS SEEN Performed at [facility], [address], [place], [place] [address]    Culture  Final    FEW ENTEROBACTER CLOACAE NO ANAEROBES ISOLATED; CULTURE IN PROGRESS FOR 5 DAYS    Report Status PENDING  Incomplete   Organism ID, Bacteria ENTEROBACTER CLOACAE  Final      Susceptibility   Enterobacter cloacae - MIC*    CEFAZOLIN >=64 RESISTANT Resistant     CEFEPIME <=0.12 SENSITIVE Sensitive     CEFTAZIDIME <=1 SENSITIVE Sensitive     CIPROFLOXACIN <=0.25 SENSITIVE Sensitive     GENTAMICIN <=1 SENSITIVE Sensitive     IMIPENEM 0.5 SENSITIVE Sensitive     TRIMETH/SULFA <=20 SENSITIVE Sensitive     PIP/TAZO <=4 SENSITIVE Sensitive     * FEW ENTEROBACTER CLOACAE  Aerobic/Anaerobic Culture (surgical/deep wound)     Status: None   Collection Time: 10/14/19  2:42 PM   Specimen: Soft Tissue, Other; Body Fluid  Result Value Ref Range Status   Specimen Description BONE  Final   Special Requests PROXIMAL PHALANX LEFT  Final   Gram Stain   Final    RARE WBC PRESENT, PREDOMINANTLY PMN RARE GRAM POSITIVE COCCI RARE GRAM VARIABLE ROD Performed at [facility], [place] [address], [place], [place] [address]    Culture FEW ENTEROBACTER CLOACAE FEW Meredith Pel   Final   Report Status 10/17/2019 FINAL  Final   Organism ID, Bacteria ENTEROBACTER CLOACAE  Final      Susceptibility   Enterobacter cloacae - MIC*    CEFAZOLIN >=64 RESISTANT Resistant     CEFEPIME <=0.12 SENSITIVE Sensitive     CEFTAZIDIME <=1 SENSITIVE Sensitive     CIPROFLOXACIN <=0.25 SENSITIVE Sensitive     GENTAMICIN <=1 SENSITIVE Sensitive     IMIPENEM 0.5 SENSITIVE Sensitive     TRIMETH/SULFA <=20 SENSITIVE Sensitive     PIP/TAZO <=4 SENSITIVE Sensitive     * FEW ENTEROBACTER CLOACAE  Aerobic/Anaerobic Culture (surgical/deep wound)     Status: None (Preliminary result)   Collection Time: 10/14/19  2:43 PM   Specimen:  Other Source; Tissue  Result Value Ref Range Status   Specimen Description TISSUE SWAB  Final   Special Requests 2ND INTERSPACE  Final   Gram Stain   Final    RARE WBC PRESENT, PREDOMINANTLY PMN NO ORGANISMS SEEN Performed at [facility], [place] [address], [place],  [address]    Culture   Final    FEW ENTEROBACTER CLOACAE NO ANAEROBES ISOLATED; CULTURE IN PROGRESS FOR 5 DAYS    Report Status PENDING  Incomplete   Organism ID, Bacteria ENTEROBACTER CLOACAE  Final      Susceptibility   Enterobacter cloacae - MIC*    CEFAZOLIN >=64 RESISTANT Resistant     CEFEPIME <=0.12 SENSITIVE Sensitive     CEFTAZIDIME <=1 SENSITIVE Sensitive     CIPROFLOXACIN <=0.25 SENSITIVE Sensitive     GENTAMICIN <=1 SENSITIVE Sensitive     IMIPENEM 0.5 SENSITIVE Sensitive     TRIMETH/SULFA <=20 SENSITIVE Sensitive     PIP/TAZO <=4 SENSITIVE Sensitive     * FEW ENTEROBACTER CLOACAE  Surgical pcr screen     Status: None   Collection Time: 10/16/19  6:42 PM   Specimen: Nasal Mucosa; Nasal Swab  Result Value Ref Range Status   MRSA, PCR NEGATIVE NEGATIVE Final   Staphylococcus aureus NEGATIVE NEGATIVE Final    Comment: (NOTE) The Xpert SA Assay (FDA approved for NASAL specimens in patients 39 years of age and older), is one component of a comprehensive surveillance program. It is not intended to diagnose infection nor  to guide or monitor treatment. Performed at [facility], [place] [address], [place], [place] [address]          Radiology Studies: DG Foot Complete Left  Result Date: 10/17/2019 CLINICAL DATA:  Foot surgery removal of implant EXAM: DG C-ARM 1-60 MIN; LEFT FOOT - COMPLETE 3+ VIEW CONTRAST:  None FLUOROSCOPY TIME:  Fluoroscopy Time:  52 seconds Number of Acquired Spot Images: 2 COMPARISON:  10/14/2019 FINDINGS: Two low resolution intraoperative spot views of the left foot. Removal of first MTP implant with presence of rounded radiopaque material in the region. Placement  of external fixation devices over the first digit and tarsal bones. IMPRESSION: Intraoperative fluoroscopic assistance provided during left foot surgery Electronically Signed   By: Donavan Foil M.D.   On: 10/17/2019 19:45   DG C-Arm 1-60 Min  Result Date: 10/17/2019 CLINICAL DATA:  Foot surgery removal of implant EXAM: DG C-ARM 1-60 MIN; LEFT FOOT - COMPLETE 3+ VIEW CONTRAST:  None FLUOROSCOPY TIME:  Fluoroscopy Time:  52 seconds Number of Acquired Spot Images: 2 COMPARISON:  10/14/2019 FINDINGS: Two low resolution intraoperative spot views of the left foot. Removal of first MTP implant with presence of rounded radiopaque material in the region. Placement of external fixation devices over the first digit and tarsal bones. IMPRESSION: Intraoperative fluoroscopic assistance provided during left foot surgery Electronically Signed   By: Donavan Foil M.D.   On: 10/17/2019 19:45   [place] EKG SITE RITE  Result Date: 10/17/2019 If Site Rite image not attached, placement could not be confirmed due to current cardiac rhythm.       Scheduled Meds: . aspirin EC  81 mg Oral Daily  . brimonidine  1 drop Both Eyes BID  . Chlorhexidine Gluconate Cloth  6 each Topical Daily  . dorzolamide-timolol  1 drop Both Eyes BID  . enoxaparin (LOVENOX) injection  40 mg Subcutaneous Q24H  . ferrous sulfate  325 mg Oral Q breakfast  . gabapentin  300 mg Oral q morning - 10a   And  . gabapentin  600 mg Oral QHS  . latanoprost  1 drop Both Eyes QHS  . multivitamin with minerals  1 tablet Oral Daily  . omega-3 acid ethyl esters  1 g Oral Daily  . sodium chloride flush  10-40 mL Intracatheter Q12H   Continuous Infusions: . ertapenem       LOS: 4 days    Time spent:40 min    Hadlie Gipson, Geraldo Docker, MD [facility] Pager [phone]  If 7PM-7AM, please contact night-coverage [email or web address] Password [email or web address] 10/18/2019, 1:37 PM

## 2019-10-18 NOTE — Progress Notes (Signed)
PT Cancellation Note  Patient Details Name: JURNEE NAKAYAMA MRN: [id number] DOB: 1955-12-03   Cancelled Treatment:    Reason Eval/Treat Not Completed: Other (comment) Pt declining treatment on arrival to room stating she had a rough couple of hours this am. Pt attempted to elaborate when lab arrived to room. PTA stepped away for lab to complete their work. Will check back as time allows.    Allena Katz 10/18/2019, 10:38 AM

## 2019-10-18 NOTE — Plan of Care (Signed)

## 2019-10-18 NOTE — Plan of Care (Signed)

## 2019-10-18 NOTE — TOC Progression Note (Signed)
Transition of Care [facility]) - Progression Note    Patient Details  Name: Alexis Ball MRN: [id number] Date of Birth: 08-Jan-1956  Transition of [facility]) CM/SW Contact  Sharin Mons, RN Phone Number: 10/18/2019, 5:12 PM  Clinical Narrative:    Consult received: Home health care, will need wound VAC. Prefer [facility] if pt has no preference and insurance allows. Will be by hospital around noon to fill out [facility] paperwork if available on the chart. Please secure chat if more info needed. Wound measurements in pended op note   Referral made with [employer] for wound vac.Application faxed to [place] for completing.MD's office to fax completed vac application to [employer]. Once approval received Vac will be delivered to bedside prior to pt's d/c. [facility] is not in network with pt's insurance. Referral made with  [facility]  for [facility] services .... acceptance pending.  TOC team will continue to monitor   Expected Discharge Plan: [place] Barriers to Discharge: Continued Medical Work up  Expected Discharge Plan and Services Expected Discharge Plan: [place]                         DME Arranged:  (IV ABX THERAPY) DME Agency: Other - Comment Roel Cluck) Date DME Agency Contacted: 10/15/19 Time DME Agency Contacted: [phone] Representative spoke with at DME Agency: [place]: PT, RN [place] Agency: [place] Date [place]: 10/18/19 Time [place]: [place] Representative spoke with at [place]: [place] ([place]) Interventions    Readmission Risk Interventions No flowsheet data found.

## 2019-10-18 NOTE — Progress Notes (Signed)
,  Subjective:  Patient ID: Alexis Ball, female    DOB: 1955/08/10,  MRN: [id number]  Patient seen bedside. Had a lot of pain today and issues with getting her pain medications. Got her PICC line. Objective:   Vitals:   10/18/19 1704 10/18/19 2017  BP: 138/84 (!) 162/105  Pulse: 89 72  Resp: 18 16  Temp: 98.5 F (36.9 C) 98.6 F (37 C)  SpO2: 100% 99%   General AA&O x3. Normal mood and affect.  Vascular  foot warm and well-perfused  Neurologic Epicritic sensation grossly intact.  Dermatologic Dressing clean dry and intact  Orthopedic: Positive motor to the digits   Results for orders placed or performed during the hospital encounter of 10/13/19  SARS Coronavirus 2 by RT PCR (hospital order, performed in [facility] hospital lab) Nasopharyngeal Nasopharyngeal Swab     Status: None   Collection Time: 10/14/19  4:46 AM   Specimen: Nasopharyngeal Swab  Result Value Ref Range Status   SARS Coronavirus 2 NEGATIVE NEGATIVE Final    Comment: (NOTE) SARS-CoV-2 target nucleic acids are NOT DETECTED.  The SARS-CoV-2 RNA is generally detectable in upper and lower respiratory specimens during the acute phase of infection. The lowest concentration of SARS-CoV-2 viral copies this assay can detect is 250 copies / mL. A negative result does not preclude SARS-CoV-2 infection and should not be used as the sole basis for treatment or other patient management decisions.  A negative result may occur with improper specimen collection / handling, submission of specimen other than nasopharyngeal swab, presence of viral mutation(s) within the areas targeted by this assay, and inadequate number of viral copies (<250 copies / mL). A negative result must be combined with clinical observations, patient history, and epidemiological information.  Fact Sheet for Patients:   StrictlyIdeas.no  Fact Sheet for Healthcare  Providers: BankingDealers.co.za  This test is not yet approved or  cleared by the [place] FDA and has been authorized for detection and/or diagnosis of SARS-CoV-2 by FDA under an Emergency Use Authorization (EUA).  This EUA will remain in effect (meaning this test can be used) for the duration of the COVID-19 declaration under Section 564(b)(1) of the Act, 21 U.S.C. section 360bbb-3(b)(1), unless the authorization is terminated or revoked sooner.  Performed at [facility], [place] [address] [place], [place] [address]   Aerobic/Anaerobic Culture (surgical/deep wound)     Status: None (Preliminary result)   Collection Time: 10/14/19  2:37 PM   Specimen: Soft Tissue, Other; Body Fluid  Result Value Ref Range Status   Specimen Description TISSUE SWAB  Final   Special Requests 2ND LEFTFOOT  Final   Gram Stain   Final    NO WBC SEEN NO ORGANISMS SEEN Performed at [facility], [address], [place],  [address]    Culture   Final    FEW ENTEROBACTER CLOACAE NO ANAEROBES ISOLATED; CULTURE IN PROGRESS FOR 5 DAYS    Report Status PENDING  Incomplete   Organism ID, Bacteria ENTEROBACTER CLOACAE  Final      Susceptibility   Enterobacter cloacae - MIC*    CEFAZOLIN >=64 RESISTANT Resistant     CEFEPIME <=0.12 SENSITIVE Sensitive     CEFTAZIDIME <=1 SENSITIVE Sensitive     CIPROFLOXACIN <=0.25 SENSITIVE Sensitive     GENTAMICIN <=1 SENSITIVE Sensitive     IMIPENEM 0.5 SENSITIVE Sensitive     TRIMETH/SULFA <=20 SENSITIVE Sensitive     PIP/TAZO <=4 SENSITIVE Sensitive     *  FEW ENTEROBACTER CLOACAE  Aerobic/Anaerobic Culture (surgical/deep wound)     Status: None   Collection Time: 10/14/19  2:42 PM   Specimen: Soft Tissue, Other; Body Fluid  Result Value Ref Range Status   Specimen Description BONE  Final   Special Requests PROXIMAL PHALANX LEFT  Final   Gram Stain   Final    RARE WBC PRESENT, PREDOMINANTLY PMN RARE GRAM POSITIVE  COCCI RARE GRAM VARIABLE ROD Performed at [facility], [place] [address], [place], [place] [address]    Culture FEW ENTEROBACTER CLOACAE FEW Meredith Pel   Final   Report Status 10/17/2019 FINAL  Final   Organism ID, Bacteria ENTEROBACTER CLOACAE  Final      Susceptibility   Enterobacter cloacae - MIC*    CEFAZOLIN >=64 RESISTANT Resistant     CEFEPIME <=0.12 SENSITIVE Sensitive     CEFTAZIDIME <=1 SENSITIVE Sensitive     CIPROFLOXACIN <=0.25 SENSITIVE Sensitive     GENTAMICIN <=1 SENSITIVE Sensitive     IMIPENEM 0.5 SENSITIVE Sensitive     TRIMETH/SULFA <=20 SENSITIVE Sensitive     PIP/TAZO <=4 SENSITIVE Sensitive     * FEW ENTEROBACTER CLOACAE  Aerobic/Anaerobic Culture (surgical/deep wound)     Status: None (Preliminary result)   Collection Time: 10/14/19  2:43 PM   Specimen: Other Source; Tissue  Result Value Ref Range Status   Specimen Description TISSUE SWAB  Final   Special Requests 2ND INTERSPACE  Final   Gram Stain   Final    RARE WBC PRESENT, PREDOMINANTLY PMN NO ORGANISMS SEEN Performed at [facility], [place] [address] [place], [place] [address]    Culture   Final    FEW ENTEROBACTER CLOACAE NO ANAEROBES ISOLATED; CULTURE IN PROGRESS FOR 5 DAYS    Report Status PENDING  Incomplete   Organism ID, Bacteria ENTEROBACTER CLOACAE  Final      Susceptibility   Enterobacter cloacae - MIC*    CEFAZOLIN >=64 RESISTANT Resistant     CEFEPIME <=0.12 SENSITIVE Sensitive     CEFTAZIDIME <=1 SENSITIVE Sensitive     CIPROFLOXACIN <=0.25 SENSITIVE Sensitive     GENTAMICIN <=1 SENSITIVE Sensitive     IMIPENEM 0.5 SENSITIVE Sensitive     TRIMETH/SULFA <=20 SENSITIVE Sensitive     PIP/TAZO <=4 SENSITIVE Sensitive     * FEW ENTEROBACTER CLOACAE  Surgical pcr screen     Status: None   Collection Time: 10/16/19  6:42 PM   Specimen: Nasal Mucosa; Nasal Swab  Result Value Ref Range Status   MRSA, PCR NEGATIVE NEGATIVE Final   Staphylococcus aureus NEGATIVE  NEGATIVE Final    Comment: (NOTE) The Xpert SA Assay (FDA approved for NASAL specimens in patients 97 years of age and older), is one component of a comprehensive surveillance program. It is not intended to diagnose infection nor to guide or monitor treatment. Performed at [facility], [place] [address], [place],  [address]     Assessment & Plan:  Patient was evaluated and treated and all questions answered.  L Foot Osteomyelitis s/p ROH, Application of External Fixator, Insertion of Abx Spacer, Debridement and Irrigation of Wounds -Abx per ID. -PICC line in place -Order bedside commode -Wound VAC at D/c. Ordered. -Wound care: Wound VAC change tomorrow. Order placed.  [place] for d/c once home health care needs.  Evelina Bucy, DPM  Accessible via secure chat for questions or concerns.

## 2019-10-18 NOTE — Progress Notes (Signed)
[facility]  Date of Admission:  10/13/2019     Total days of antibiotics 6         ASSESSMENT:  Alexis Ball is POD 1 from hardware removal. Cultures from 7/4 with enterobacter cloacae and finegoldia magna. Will change antibiotic therapy from cefepime to ertapenem. PICC line placed today. Will need 6 weeks of IV therapy from 7/7 with end date of 8/18. OPAT and Home Health orders placed below. Continue wound care per Dr. March Rummage. Will arrange follow up in ID office.   PLAN:  1. Change cefepime to ertapenem through 8/18. 2. OPAT orders and Home Health.  3. Wound care per Dr. March Rummage. 4. Arrange follow up in ID office.   Active Problems:   Abscess of bursa of left foot   Foot abscess, left   Wound infection  Diagnosis: Osteomyelitis   Culture Result: Enterobacter Cloacae and Finegoldia magna  No Known Allergies  OPAT Orders Discharge antibiotics to be given via PICC line Discharge antibiotics: Per pharmacy protocol   Duration: 6 weeks End Date: 11/28/19  [facility] Care Per Protocol:  Home health RN for IV administration and teaching; PICC line care and labs.    Labs weekly while on IV antibiotics: _X_ CBC with differential _X_ BMP __ CMP _X_ CRP _X_ ESR __ Vancomycin trough __ CK  _X_ Please pull PIC at completion of IV antibiotics __ Please leave PIC in place until doctor has seen patient or been notified  Fax weekly labs to [phone]  Clinic Follow Up Appt:  11/08/19 at 9:45 with Dr. Linus Salmons    . aspirin EC  81 mg Oral Daily  . brimonidine  1 drop Both Eyes BID  . Chlorhexidine Gluconate Cloth  6 each Topical Daily  . dorzolamide-timolol  1 drop Both Eyes BID  . enoxaparin (LOVENOX) injection  40 mg Subcutaneous Q24H  . ferrous sulfate  325 mg Oral Q breakfast  . gabapentin  300 mg Oral q morning - 10a   And  . gabapentin  600 mg Oral QHS  . latanoprost  1 drop Both Eyes QHS  . multivitamin with minerals  1 tablet Oral Daily   . omega-3 acid ethyl esters  1 g Oral Daily  . sodium chloride flush  10-40 mL Intracatheter Q12H    SUBJECTIVE:  Afebrile overnight with no acute events. Cultures now growing Finegoldia in addition to enterobacter. Feeling okay today. Has several questions regarding the wound vac.   No Known Allergies   Review of Systems: Review of Systems  Constitutional: Negative for chills, fever and weight loss.  Respiratory: Negative for cough, shortness of breath and wheezing.   Cardiovascular: Negative for chest pain and leg swelling.  Gastrointestinal: Negative for abdominal pain, constipation, diarrhea, nausea and vomiting.  Skin: Negative for rash.      OBJECTIVE: Vitals:   10/17/19 1923 10/17/19 2010 10/18/19 0016 10/18/19 0412  BP: (!) 155/72 (!) 158/86 135/74 128/71  Pulse: (!) 54 (!) 57 66 83  Resp: _0 Temp: 97.7 F (36.5 C) (!) 97.4 F (36.3 C) 98.5 F (36.9 C) (!) 97.5 F (36.4 C)  TempSrc:  Oral Oral Oral  SpO2: 100% 99% 98% 98%  Weight:      Height:       Body mass index is 32.06 kg/m.  Physical Exam Constitutional:      General: She is not in acute distress.    Appearance: She is well-developed.  Cardiovascular:  Rate and Rhythm: Normal rate and regular rhythm.     Heart sounds: Normal heart sounds.     Comments: PICC line right upper extremity with clean and dry dressing Pulmonary:     Effort: Pulmonary effort is normal.     Breath sounds: Normal breath sounds.  Musculoskeletal:     Comments: Surgical dressing is clean and dry. Hardware in place; wound VAC patent.   Skin:    General: Skin is warm and dry.  Neurological:     Mental Status: She is alert and oriented to person, place, and time.  Psychiatric:        Behavior: Behavior normal.        Thought Content: Thought content normal.        Judgment: Judgment normal.     Lab Results Lab Results  Component Value Date   WBC 8.5 10/16/2019   HGB 10.1 (L) 10/16/2019   HCT 31.9 (L)  10/16/2019   MCV 93.3 10/16/2019   PLT 376 10/16/2019    Lab Results  Component Value Date   CREATININE 0.93 10/16/2019   BUN 11 10/16/2019   NA 138 10/16/2019   K 3.3 (L) 10/16/2019   CL 104 10/16/2019   CO2 24 10/16/2019    Lab Results  Component Value Date   ALT 18 08/05/2017   AST 19 08/05/2017   ALKPHOS 53 08/05/2017   BILITOT 0.8 08/05/2017     Microbiology: Recent Results (from the past 240 hour(s))  SARS Coronavirus 2 by RT PCR (hospital order, performed in [place] hospital lab) Nasopharyngeal Nasopharyngeal Swab     Status: None   Collection Time: 10/14/19  4:46 AM   Specimen: Nasopharyngeal Swab  Result Value Ref Range Status   SARS Coronavirus 2 NEGATIVE NEGATIVE Final    Comment: (NOTE) SARS-CoV-2 target nucleic acids are NOT DETECTED.  The SARS-CoV-2 RNA is generally detectable in upper and lower respiratory specimens during the acute phase of infection. The lowest concentration of SARS-CoV-2 viral copies this assay can detect is 250 copies / mL. A negative result does not preclude SARS-CoV-2 infection and should not be used as the sole basis for treatment or other patient management decisions.  A negative result may occur with improper specimen collection / handling, submission of specimen other than nasopharyngeal swab, presence of viral mutation(s) within the areas targeted by this assay, and inadequate number of viral copies (<250 copies / mL). A negative result must be combined with clinical observations, patient history, and epidemiological information.  Fact Sheet for Patients:   StrictlyIdeas.no  Fact Sheet for Healthcare Providers: BankingDealers.co.za  This test is not yet approved or  cleared by the [place] FDA and has been authorized for detection and/or diagnosis of SARS-CoV-2 by FDA under an Emergency Use Authorization (EUA).  This EUA will remain in effect (meaning this test can be  used) for the duration of the COVID-19 declaration under Section 564(b)(1) of the Act, 21 U.S.C. section 360bbb-3(b)(1), unless the authorization is terminated or revoked sooner.  Performed at [facility], [place] [address] [place],  [address]   Aerobic/Anaerobic Culture (surgical/deep wound)     Status: None (Preliminary result)   Collection Time: 10/14/19  2:37 PM   Specimen: Soft Tissue, Other; Body Fluid  Result Value Ref Range Status   Specimen Description TISSUE SWAB  Final   Special Requests 2ND LEFTFOOT  Final   Gram Stain   Final    NO WBC SEEN NO ORGANISMS SEEN  Performed at [facility], [place] [address] [place], [place] [address]    Culture   Final    FEW ENTEROBACTER CLOACAE NO ANAEROBES ISOLATED; CULTURE IN PROGRESS FOR 5 DAYS    Report Status PENDING  Incomplete   Organism ID, Bacteria ENTEROBACTER CLOACAE  Final      Susceptibility   Enterobacter cloacae - MIC*    CEFAZOLIN >=64 RESISTANT Resistant     CEFEPIME <=0.12 SENSITIVE Sensitive     CEFTAZIDIME <=1 SENSITIVE Sensitive     CIPROFLOXACIN <=0.25 SENSITIVE Sensitive     GENTAMICIN <=1 SENSITIVE Sensitive     IMIPENEM 0.5 SENSITIVE Sensitive     TRIMETH/SULFA <=20 SENSITIVE Sensitive     PIP/TAZO <=4 SENSITIVE Sensitive     * FEW ENTEROBACTER CLOACAE  Aerobic/Anaerobic Culture (surgical/deep wound)     Status: None   Collection Time: 10/14/19  2:42 PM   Specimen: Soft Tissue, Other; Body Fluid  Result Value Ref Range Status   Specimen Description BONE  Final   Special Requests PROXIMAL PHALANX LEFT  Final   Gram Stain   Final    RARE WBC PRESENT, PREDOMINANTLY PMN RARE GRAM POSITIVE COCCI RARE GRAM VARIABLE ROD Performed at [facility], [place] [address], [place], [place] [address]    Culture FEW ENTEROBACTER CLOACAE FEW Meredith Pel   Final   Report Status 10/17/2019 FINAL  Final   Organism ID, Bacteria ENTEROBACTER CLOACAE  Final      Susceptibility   Enterobacter  cloacae - MIC*    CEFAZOLIN >=64 RESISTANT Resistant     CEFEPIME <=0.12 SENSITIVE Sensitive     CEFTAZIDIME <=1 SENSITIVE Sensitive     CIPROFLOXACIN <=0.25 SENSITIVE Sensitive     GENTAMICIN <=1 SENSITIVE Sensitive     IMIPENEM 0.5 SENSITIVE Sensitive     TRIMETH/SULFA <=20 SENSITIVE Sensitive     PIP/TAZO <=4 SENSITIVE Sensitive     * FEW ENTEROBACTER CLOACAE  Aerobic/Anaerobic Culture (surgical/deep wound)     Status: None (Preliminary result)   Collection Time: 10/14/19  2:43 PM   Specimen: Other Source; Tissue  Result Value Ref Range Status   Specimen Description TISSUE SWAB  Final   Special Requests 2ND INTERSPACE  Final   Gram Stain   Final    RARE WBC PRESENT, PREDOMINANTLY PMN NO ORGANISMS SEEN Performed at [facility], [place] [address] [place], [place] [address]    Culture   Final    FEW ENTEROBACTER CLOACAE NO ANAEROBES ISOLATED; CULTURE IN PROGRESS FOR 5 DAYS    Report Status PENDING  Incomplete   Organism ID, Bacteria ENTEROBACTER CLOACAE  Final      Susceptibility   Enterobacter cloacae - MIC*    CEFAZOLIN >=64 RESISTANT Resistant     CEFEPIME <=0.12 SENSITIVE Sensitive     CEFTAZIDIME <=1 SENSITIVE Sensitive     CIPROFLOXACIN <=0.25 SENSITIVE Sensitive     GENTAMICIN <=1 SENSITIVE Sensitive     IMIPENEM 0.5 SENSITIVE Sensitive     TRIMETH/SULFA <=20 SENSITIVE Sensitive     PIP/TAZO <=4 SENSITIVE Sensitive     * FEW ENTEROBACTER CLOACAE  Surgical pcr screen     Status: None   Collection Time: 10/16/19  6:42 PM   Specimen: Nasal Mucosa; Nasal Swab  Result Value Ref Range Status   MRSA, PCR NEGATIVE NEGATIVE Final   Staphylococcus aureus NEGATIVE NEGATIVE Final    Comment: (NOTE) The Xpert SA Assay (FDA approved for NASAL specimens in patients 22 years of  age and older), is one component of a comprehensive surveillance program. It is not intended to diagnose infection nor to guide or monitor treatment. Performed at [facility], [place]  [address] [place], [place] [address]      Terri Piedra, [facility]  10/18/2019  10:19 AM

## 2019-10-19 ENCOUNTER — Telehealth: Payer: Self-pay | Admitting: *Deleted

## 2019-10-19 DIAGNOSIS — E876 Hypokalemia: Secondary | ICD-10-CM | POA: Diagnosis not present

## 2019-10-19 DIAGNOSIS — L02612 Cutaneous abscess of left foot: Secondary | ICD-10-CM | POA: Diagnosis not present

## 2019-10-19 DIAGNOSIS — D500 Iron deficiency anemia secondary to blood loss (chronic): Secondary | ICD-10-CM

## 2019-10-19 DIAGNOSIS — M71072 Abscess of bursa, left ankle and foot: Secondary | ICD-10-CM | POA: Diagnosis not present

## 2019-10-19 LAB — CBC WITH DIFFERENTIAL/PLATELET
Abs Immature Granulocytes: 0.09 10*3/uL — ABNORMAL HIGH (ref 0.00–0.07)
Basophils Absolute: 0 10*3/uL (ref 0.0–0.1)
Basophils Relative: 1 %
Eosinophils Absolute: 0.2 10*3/uL (ref 0.0–0.5)
Eosinophils Relative: 3 %
HCT: 30.6 % — ABNORMAL LOW (ref 36.0–46.0)
Hemoglobin: 9.6 g/dL — ABNORMAL LOW (ref 12.0–15.0)
Immature Granulocytes: 1 %
Lymphocytes Relative: 26 %
Lymphs Abs: 2.2 10*3/uL (ref 0.7–4.0)
MCH: 29.2 pg (ref 26.0–34.0)
MCHC: 31.4 g/dL (ref 30.0–36.0)
MCV: 93 fL (ref 80.0–100.0)
Monocytes Absolute: 0.6 10*3/uL (ref 0.1–1.0)
Monocytes Relative: 7 %
Neutro Abs: 5.3 10*3/uL (ref 1.7–7.7)
Neutrophils Relative %: 62 %
Platelets: 336 10*3/uL (ref 150–400)
RBC: 3.29 MIL/uL — ABNORMAL LOW (ref 3.87–5.11)
RDW: 12.1 % (ref 11.5–15.5)
WBC: 8.5 10*3/uL (ref 4.0–10.5)
nRBC: 0 % (ref 0.0–0.2)

## 2019-10-19 LAB — AEROBIC/ANAEROBIC CULTURE W GRAM STAIN (SURGICAL/DEEP WOUND): Gram Stain: NONE SEEN

## 2019-10-19 LAB — PHOSPHORUS: Phosphorus: 3.3 mg/dL (ref 2.5–4.6)

## 2019-10-19 LAB — COMPREHENSIVE METABOLIC PANEL
ALT: 23 U/L (ref 0–44)
AST: 28 U/L (ref 15–41)
Albumin: 2.1 g/dL — ABNORMAL LOW (ref 3.5–5.0)
Alkaline Phosphatase: 52 U/L (ref 38–126)
Anion gap: 6 (ref 5–15)
BUN: 10 mg/dL (ref 8–23)
CO2: 25 mmol/L (ref 22–32)
Calcium: 8.5 mg/dL — ABNORMAL LOW (ref 8.9–10.3)
Chloride: 108 mmol/L (ref 98–111)
Creatinine, Ser: 0.73 mg/dL (ref 0.44–1.00)
GFR calc Af Amer: >60 mL/min (ref >60)
GFR calc non Af Amer: >60 mL/min (ref >60)
Glucose, Bld: 121 mg/dL — ABNORMAL HIGH (ref 70–99)
Potassium: 3.5 mmol/L (ref 3.5–5.1)
Sodium: 139 mmol/L (ref 135–145)
Total Bilirubin: 0.4 mg/dL (ref 0.3–1.2)
Total Protein: 5.5 g/dL — ABNORMAL LOW (ref 6.5–8.1)

## 2019-10-19 LAB — MAGNESIUM: Magnesium: 1.8 mg/dL (ref 1.7–2.4)

## 2019-10-19 MED ORDER — ASCORBIC ACID 500 MG PO TABS
500.0000 mg | ORAL_TABLET | Freq: Every day | ORAL | Status: DC
  Administered 2019-10-19 – 2019-11-09 (×22): 500 mg via ORAL
  Filled 2019-10-19 (×22): qty 1

## 2019-10-19 MED ORDER — MAGNESIUM SULFATE 2 GM/50ML IV SOLN
2.0000 g | Freq: Once | INTRAVENOUS | Status: AC
  Administered 2019-10-19: 2 g via INTRAVENOUS
  Filled 2019-10-19: qty 50

## 2019-10-19 MED ORDER — POTASSIUM CHLORIDE CRYS ER 20 MEQ PO TBCR
40.0000 meq | EXTENDED_RELEASE_TABLET | Freq: Two times a day (BID) | ORAL | Status: AC
  Administered 2019-10-19 (×2): 40 meq via ORAL
  Filled 2019-10-19 (×2): qty 2

## 2019-10-19 NOTE — Plan of Care (Signed)

## 2019-10-19 NOTE — Telephone Encounter (Signed)
[facility] - Alexis Ball called for wound measurements.

## 2019-10-19 NOTE — Telephone Encounter (Signed)
I called [facility] Alexis Ball and she states Dr. March Rummage updated the op note and she has all she needs.

## 2019-10-19 NOTE — TOC Progression Note (Addendum)
Transition of Care [facility]) - Progression Note    Patient Details  Name: Alexis Ball MRN: [id number] Date of Birth: 1955-12-27  [facility] of [facility] CM/SW Contact  Sharin Mons, RN Phone Number: 10/19/2019, 10:47 AM  Clinical Narrative:    [facility] declined acceptance 2/2 limited staff , pt need ( wound vac, LT IVABX).  Referral made with [facility], acceptance pending ...  10/17/2019 [facility] agency unable to accept. [place], [place] all unable to provide [facility] services  Referral made with [facility]... acceptance pending   Expected Discharge Plan: [place] Services Barriers to Discharge: Other (comment) (unable to land home health provider)  Expected Discharge Plan and Services Expected Discharge Plan: [place]                         DME Arranged:  (IV ABX THERAPY) DME Agency: Other - Comment [facility]) Date DME Agency Contacted: 10/15/19 Time DME Agency Contacted: [phone] Representative spoke with at DME Agency: [place]: PT, RN [place] Agency: [facility] [place] Date [place]: 10/19/19 Time [place]: 59 Representative spoke with at [place]: [place] ([place]) Interventions    Readmission Risk Interventions No flowsheet data found.

## 2019-10-19 NOTE — Progress Notes (Signed)
Physical Therapy Treatment Patient Details Name: Alexis Ball MRN: [id number] DOB: 09-Apr-1956 Today's Date: 10/19/2019    History of Present Illness Pt is a 64 yo female presenting s/p surgical drainage of L foot infection 7/4 with bone biopsy due to concern for possible osteomyelitis after bunionectomy with silicone implant on 7/06. Further examination reports osteomyelitis with infection of hardware.  Pt s/p I&D with hard ware removal/silicon implant removal and now ex fix and antibiotic spacer placed.  Surgeon also placed wound vac after procedure.   PMH includes: HTN, and stroke.    PT Comments    Pt supine in bed on arrival this session.  Pt required max cues from PTA and family to get OOB and progress mobility.  Pt anxious and nervous about pain but did quite well once she was up and moving.  Pt continues to benefit from HHPT at d/c.     Follow Up Recommendations  Home health PT;Supervision/Assistance - 24 hour     Equipment Recommendations  None recommended by PT    Recommendations for Other Services       Precautions / Restrictions Precautions Precautions: Fall Required Braces or Orthoses: Other Brace Other Brace: CAM boot LLE- difficult to tighten over ex fix. Restrictions Weight Bearing Restrictions: Yes LLE Weight Bearing: Partial weight bearing LLE Partial Weight Bearing Percentage or Pounds: WB thru heel in CAM walker.    Mobility  Bed Mobility Overal bed mobility: Needs Assistance Bed Mobility: Supine to Sit     Supine to sit: Min assist     General bed mobility comments: Min assistance to support RLE to edge of bed against gravity.  Transfers Overall transfer level: Needs assistance Equipment used: Rolling walker (2 wheeled) Transfers: Sit to/from Stand Sit to Stand: Min guard         General transfer comment: Min guard for safety. Stood from [employer]. Cues for hand placement.  Ambulation/Gait Ambulation/Gait assistance: Min guard Gait Distance  (Feet): 60 Feet Assistive device: Rolling walker (2 wheeled) Gait Pattern/deviations: Step-to pattern;Decreased stride length;Trunk flexed Gait velocity: decreased   General Gait Details: Pt required cues for weight bearing and safety with RW.   Stairs             Wheelchair Mobility    Modified Rankin (Stroke Patients Only)       Balance Overall balance assessment: Needs assistance Sitting-balance support: Feet supported;No upper extremity supported Sitting balance-Leahy Scale: Good Sitting balance - Comments: total A to donn CAM boot     Standing balance-Leahy Scale: Poor                              Cognition Arousal/Alertness: Awake/alert Behavior During Therapy: WFL for tasks assessed/performed Overall Cognitive Status: Within Functional Limits for tasks assessed                                 General Comments: pt with good recal of WB status, good adherence with mobility      Exercises      General Comments        Pertinent Vitals/Pain Pain Assessment: Faces Pain Score: 8  Faces Pain Scale: Hurts a little bit Pain Location: surgical site Pain Descriptors / Indicators: Sore Pain Intervention(s): Monitored during session;Repositioned    Home Living  Prior Function            PT Goals (current goals can now be found in the care plan section) Acute Rehab PT Goals Patient Stated Goal: return home Potential to Achieve Goals: Good Progress towards PT goals: Progressing toward goals    Frequency    Min 3X/week      PT Plan Frequency needs to be updated;Current plan remains appropriate    Co-evaluation              AM-PAC PT "6 Clicks" Mobility   Outcome Measure  Help needed turning from your back to your side while in a flat bed without using bedrails?: None Help needed moving from lying on your back to sitting on the side of a flat bed without using bedrails?: None Help  needed moving to and from a bed to a chair (including a wheelchair)?: A Little Help needed standing up from a chair using your arms (e.g., wheelchair or bedside chair)?: A Little Help needed to walk in hospital room?: A Little Help needed climbing 3-5 steps with a railing? : A Little 6 Click Score: 20    End of Session Equipment Utilized During Treatment: Gait belt Activity Tolerance: Patient tolerated treatment well Patient left: in chair;with call bell/phone within reach Nurse Communication: Mobility status PT Visit Diagnosis: Other abnormalities of gait and mobility (R26.89);Muscle weakness (generalized) (M62.81);Pain Pain - Right/Left: Left Pain - part of body: Ankle and joints of foot     Time: 1309-1350 PT Time Calculation (min) (ACUTE ONLY): 41 min  Charges:  $Gait Training: 8-22 mins $Therapeutic Activity: 23-37 mins                     Erasmo Leventhal , PTA Acute Rehabilitation Services Pager [phone] Office [phone]     Jhace Fennell Eli Hose 10/19/2019, 2:31 PM

## 2019-10-19 NOTE — Progress Notes (Signed)
PROGRESS NOTE    Alexis Ball  FHL:[id number] DOB: Sep 05, 1955 DOA: 10/13/2019 PCP: Lucianne Lei, MD     Brief Narrative:  64 y.o. BF PMHx HTN, remote CVA, and recentbunionectomy with silicone implant of her left foot on June 25.  Patient started to have increasing pain and drainage from thesurgicalwoundaftershe tripped and fell down on the foot last week. She went to podiatrist office 3 days ago, x-ray showed no significant displacement and of the implant and MTP joints, but there was a local infection going on, podiatrist ordered clindamycin plus Keflex regimen which she has been taking. But today, she noticed that the wound is "busted open"with maggotscoming out.     Subjective: 7/9 A/O x4, negative S OB, negative CP. States ambulated short distance today in a boot with PT. LEFT foot pain tolerable. States understands she will need to go to SNF though upset insurance will not pay for care at home.   Assessment & Plan: Covid vaccination;   Active Problems:   Abscess of bursa of left foot   Foot abscess, left   Wound infection  LEFT forefoot abscess/cellulitis/osteomyelitis -7/4 s/p I&D by Dr. March Rummage -7/7 s/p LEFT foot ORIF see results below -ID recommends 6 weeks of IV cefepime after amputation. -OPATorders and home health vs SNF -ID will follow up with in office. -7/8 DC cefepime---> Entapenem  HTN -Patient BP currently controlled off medication.   -Continue to monitor closely  Hypokalemia* -K-Dur 40 mEq x 2  Hypomagnesmia -Magnesium IV 2 g  History of CVA -No acute issue, continue aspirin  Chronic iron deficiency anemia -Ferrous sulfate 325 mg daily + vitamin C 500 mg daily  -outpatient GI follow-up  Obese Body mass index is 32.06 kg/m.   DVT prophylaxis:  Code Status: Full Family Communication: 7/8 daughters present at bedside for discussion of plan of care Status is: Inpatient    Dispo: The patient is from: Home               Anticipated d/c is to: 7/9 after long discussion with patient understands that going home without any sort of home health care is a nonstarter, and that she needs SNF. Patient has agreed to go to SNF              Anticipated d/c date is: 7/14              Patient currently unstable in surgery      Consultants:  Podiatry  ID   Procedures/Significant Events:  7/4 s/p I&D LEFT foot with bone biopsy by Dr. March Rummage 7/7Left Foot; Removal of Silicone Implant; Insertion of Antibiotic Spacer; Application of External Fixator; Possible Application of Wound VAC (Left)    I have personally reviewed and interpreted all radiology studies and my findings are as above.  VENTILATOR SETTINGS:    Cultures 7/4 LEFT foot positive Enterobacter Cloacae 7/4 LEFT proximal phalanx positive Enterobacter Cloacae, FINEGOLDIA MAGNA 7/4 she is well second interspace positive Enterobacter Cloacae 7/4 MRSA by PCR negative 7/4 staph aureus negative 7/4 SARS coronavirus negative     Antimicrobials: Anti-infectives (From admission, onward)   Start     Ordered Stop   10/18/19 1400  ertapenem (INVANZ) 1,000 mg in sodium chloride 0.9 % 100 mL IVPB     Discontinue     10/18/19 0909     10/17/19 1739  vancomycin (VANCOCIN) powder  Status:  Discontinued        10/17/19 1740 10/17/19 1820   10/16/19 1400  ceFEPIme (MAXIPIME) 2 g in sodium chloride 0.9 % 100 mL IVPB  Status:  Discontinued        10/16/19 1233 10/18/19 0909   10/15/19 2100  vancomycin (VANCOCIN) IVPB 1000 mg/200 mL premix  Status:  Discontinued        10/15/19 1155 10/16/19 1233   10/15/19 0800  vancomycin (VANCOREADY) IVPB 750 mg/150 mL  Status:  Discontinued        10/14/19 1806 10/15/19 1155   10/14/19 1830  piperacillin-tazobactam (ZOSYN) IVPB 3.375 g  Status:  Discontinued        10/14/19 1806 10/16/19 1234   10/14/19 1800  vancomycin (VANCOREADY) IVPB 1500 mg/300 mL        10/14/19 1745 10/14/19 2051   10/14/19 1440  vancomycin (VANCOCIN)  powder  Status:  Discontinued        10/14/19 1531 10/14/19 1534   10/14/19 1400  ceFAZolin (ANCEF) IVPB 1 g/50 mL premix  Status:  Discontinued        10/14/19 1135 10/14/19 1741   10/14/19 1000  doxycycline (VIBRA-TABS) tablet 100 mg  Status:  Discontinued        10/14/19 0840 10/14/19 1745   10/14/19 0630  piperacillin-tazobactam (ZOSYN) IVPB 3.375 g        10/14/19 0623 10/14/19 0736       Devices    LINES / TUBES:  Right arm PICC line>>>    Continuous Infusions:  ertapenem 1,000 mg (10/18/19 1524)     Objective: Vitals:   10/18/19 1704 10/18/19 2017 10/19/19 0022 10/19/19 0420  BP: 138/84 (!) 162/105 (!) 150/89 (!) 153/88  Pulse: 89 72 65 70  Resp: 18 16 16 16   Temp: 98.5 F (36.9 C) 98.6 F (37 C) 97.6 F (36.4 C) 98.3 F (36.8 C)  TempSrc: Oral Oral Oral Oral  SpO2: 100% 99% 98% 100%  Weight:      Height:        Intake/Output Summary (Last 24 hours) at 10/19/2019 0746 Last data filed at 10/18/2019 1600 Gross per 24 hour  Intake 71.4 ml  Output --  Net 71.4 ml   Filed Weights   10/13/19 2329 10/17/19 1452  Weight: 82.1 kg 82.1 kg    Examination:  General: A/O x4, No acute respiratory distress, extremely uncomfortable from pain LEFT foot Eyes: negative scleral hemorrhage, negative anisocoria, negative icterus ENT: Negative Runny nose, negative gingival bleeding, Neck:  Negative scars, masses, torticollis, lymphadenopathy, JVD Lungs: Clear to auscultation bilaterally without wheezes or crackles Cardiovascular: Regular rate and rhythm without murmur gallop or rub normal S1 and S2 Abdomen: negative abdominal pain, nondistended, positive soft, bowel sounds, no rebound, no ascites, no appreciable mass Extremities: LEFT foot ORIF and Ace wrap with wound VAC in place. Minimal serosanguineous fluid being suctioned by wound VAC. Skin: Negative rashes, lesions, ulcers Psychiatric:  Negative depression, negative anxiety, negative fatigue, negative mania   Central nervous system:  Cranial nerves II through XII intact, tongue/uvula midline, all extremities muscle strength 5/5, sensation intact throughout, negative dysarthria, negative expressive aphasia, negative receptive aphasia.   Data Reviewed: Care during the described time interval was provided by me .  I have reviewed this patient's available data, including medical history, events of note, physical examination, and all test results as part of my evaluation.  CBC: Recent Labs  Lab 10/14/19 0437 10/15/19 0356 10/16/19 0424 10/18/19 1128 10/19/19 0440  WBC 11.6* 11.0* 8.5 10.6* 8.5  NEUTROABS 8.4*  --   --  7.3 5.3  HGB 11.9* 10.9* 10.1* 10.1* 9.6*  HCT 36.5 34.3* 31.9* 31.5* 30.6*  MCV 92.2 93.0 93.3 92.1 93.0  PLT 439* 425* 376 363 616   Basic Metabolic Panel: Recent Labs  Lab 10/14/19 0437 10/15/19 0356 10/16/19 0424 10/18/19 1128 10/19/19 0440  NA 137 138 138 139 139  K 4.0 3.9 3.3* 3.8 3.5  CL 100 103 104 108 108  CO2 28 26 24 24 25   GLUCOSE 102* 120* 116* 109* 121*  BUN 16 15 11 9 10   CREATININE 0.80 0.83 0.93 0.79 0.73  CALCIUM 9.4 8.7* 8.6* 8.7* 8.5*  MG  --   --   --  1.7 1.8  PHOS  --   --   --  2.4* 3.3   GFR: Estimated Creatinine Clearance: 74 mL/min (by C-G formula based on SCr of 0.73 mg/dL). Liver Function Tests: Recent Labs  Lab 10/18/19 1128 10/19/19 0440  AST 24 28  ALT 21 23  ALKPHOS 59 52  BILITOT 0.4 0.4  PROT 5.9* 5.5*  ALBUMIN 2.4* 2.1*   No results for input(s): LIPASE, AMYLASE in the last 168 hours. No results for input(s): AMMONIA in the last 168 hours. Coagulation Profile: No results for input(s): INR, PROTIME in the last 168 hours. Cardiac Enzymes: No results for input(s): CKTOTAL, CKMB, CKMBINDEX, TROPONINI in the last 168 hours. BNP (last 3 results) No results for input(s): PROBNP in the last 8760 hours. HbA1C: No results for input(s): HGBA1C in the last 72 hours. CBG: No results for input(s): GLUCAP in the last 168  hours. Lipid Profile: No results for input(s): CHOL, HDL, LDLCALC, TRIG, CHOLHDL, LDLDIRECT in the last 72 hours. Thyroid Function Tests: No results for input(s): TSH, T4TOTAL, FREET4, T3FREE, THYROIDAB in the last 72 hours. Anemia Panel: No results for input(s): VITAMINB12, FOLATE, FERRITIN, TIBC, IRON, RETICCTPCT in the last 72 hours. Sepsis Labs: No results for input(s): PROCALCITON, LATICACIDVEN in the last 168 hours.  Recent Results (from the past 240 hour(s))  SARS Coronavirus 2 by RT PCR (hospital order, performed in [facility] hospital lab) Nasopharyngeal Nasopharyngeal Swab     Status: None   Collection Time: 10/14/19  4:46 AM   Specimen: Nasopharyngeal Swab  Result Value Ref Range Status   SARS Coronavirus 2 NEGATIVE NEGATIVE Final    Comment: (NOTE) SARS-CoV-2 target nucleic acids are NOT DETECTED.  The SARS-CoV-2 RNA is generally detectable in upper and lower respiratory specimens during the acute phase of infection. The lowest concentration of SARS-CoV-2 viral copies this assay can detect is 250 copies / mL. A negative result does not preclude SARS-CoV-2 infection and should not be used as the sole basis for treatment or other patient management decisions.  A negative result may occur with improper specimen collection / handling, submission of specimen other than nasopharyngeal swab, presence of viral mutation(s) within the areas targeted by this assay, and inadequate number of viral copies (<250 copies / mL). A negative result must be combined with clinical observations, patient history, and epidemiological information.  Fact Sheet for Patients:   StrictlyIdeas.no  Fact Sheet for Healthcare Providers: BankingDealers.co.za  This test is not yet approved or  cleared by the [place] FDA and has been authorized for detection and/or diagnosis of SARS-CoV-2 by FDA under an Emergency Use Authorization (EUA).  This EUA  will remain in effect (meaning this test can be used) for the duration of the COVID-19 declaration under Section 564(b)(1) of the Act, 21 U.S.C. section 360bbb-3(b)(1), unless the authorization is terminated  or revoked sooner.  Performed at [facility], [place] [address], [place], [place] [address]   Aerobic/Anaerobic Culture (surgical/deep wound)     Status: None (Preliminary result)   Collection Time: 10/14/19  2:37 PM   Specimen: Soft Tissue, Other; Body Fluid  Result Value Ref Range Status   Specimen Description TISSUE SWAB  Final   Special Requests 2ND LEFTFOOT  Final   Gram Stain   Final    NO WBC SEEN NO ORGANISMS SEEN Performed at [facility], [address], [place], [place] [address]    Culture   Final    FEW ENTEROBACTER CLOACAE NO ANAEROBES ISOLATED; CULTURE IN PROGRESS FOR 5 DAYS    Report Status PENDING  Incomplete   Organism ID, Bacteria ENTEROBACTER CLOACAE  Final      Susceptibility   Enterobacter cloacae - MIC*    CEFAZOLIN >=64 RESISTANT Resistant     CEFEPIME <=0.12 SENSITIVE Sensitive     CEFTAZIDIME <=1 SENSITIVE Sensitive     CIPROFLOXACIN <=0.25 SENSITIVE Sensitive     GENTAMICIN <=1 SENSITIVE Sensitive     IMIPENEM 0.5 SENSITIVE Sensitive     TRIMETH/SULFA <=20 SENSITIVE Sensitive     PIP/TAZO <=4 SENSITIVE Sensitive     * FEW ENTEROBACTER CLOACAE  Aerobic/Anaerobic Culture (surgical/deep wound)     Status: None   Collection Time: 10/14/19  2:42 PM   Specimen: Soft Tissue, Other; Body Fluid  Result Value Ref Range Status   Specimen Description BONE  Final   Special Requests PROXIMAL PHALANX LEFT  Final   Gram Stain   Final    RARE WBC PRESENT, PREDOMINANTLY PMN RARE GRAM POSITIVE COCCI RARE GRAM VARIABLE ROD Performed at [facility], [place] [address], [place], [place] [address]    Culture FEW ENTEROBACTER CLOACAE FEW Meredith Pel   Final   Report Status 10/17/2019 FINAL  Final   Organism ID, Bacteria ENTEROBACTER  CLOACAE  Final      Susceptibility   Enterobacter cloacae - MIC*    CEFAZOLIN >=64 RESISTANT Resistant     CEFEPIME <=0.12 SENSITIVE Sensitive     CEFTAZIDIME <=1 SENSITIVE Sensitive     CIPROFLOXACIN <=0.25 SENSITIVE Sensitive     GENTAMICIN <=1 SENSITIVE Sensitive     IMIPENEM 0.5 SENSITIVE Sensitive     TRIMETH/SULFA <=20 SENSITIVE Sensitive     PIP/TAZO <=4 SENSITIVE Sensitive     * FEW ENTEROBACTER CLOACAE  Aerobic/Anaerobic Culture (surgical/deep wound)     Status: None (Preliminary result)   Collection Time: 10/14/19  2:43 PM   Specimen: Other Source; Tissue  Result Value Ref Range Status   Specimen Description TISSUE SWAB  Final   Special Requests 2ND INTERSPACE  Final   Gram Stain   Final    RARE WBC PRESENT, PREDOMINANTLY PMN NO ORGANISMS SEEN Performed at [facility], [place] [address] [place], [place] [address]    Culture   Final    FEW ENTEROBACTER CLOACAE NO ANAEROBES ISOLATED; CULTURE IN PROGRESS FOR 5 DAYS    Report Status PENDING  Incomplete   Organism ID, Bacteria ENTEROBACTER CLOACAE  Final      Susceptibility   Enterobacter cloacae - MIC*    CEFAZOLIN >=64 RESISTANT Resistant     CEFEPIME <=0.12 SENSITIVE Sensitive     CEFTAZIDIME <=1 SENSITIVE Sensitive     CIPROFLOXACIN <=0.25 SENSITIVE Sensitive     GENTAMICIN <=1 SENSITIVE Sensitive     IMIPENEM 0.5 SENSITIVE Sensitive  TRIMETH/SULFA <=20 SENSITIVE Sensitive     PIP/TAZO <=4 SENSITIVE Sensitive     * FEW ENTEROBACTER CLOACAE  Surgical pcr screen     Status: None   Collection Time: 10/16/19  6:42 PM   Specimen: Nasal Mucosa; Nasal Swab  Result Value Ref Range Status   MRSA, PCR NEGATIVE NEGATIVE Final   Staphylococcus aureus NEGATIVE NEGATIVE Final    Comment: (NOTE) The Xpert SA Assay (FDA approved for NASAL specimens in patients 22 years of age and older), is one component of a comprehensive surveillance program. It is not intended to diagnose infection nor to guide or monitor  treatment. Performed at [facility], [place] [address], [place], [place] [address]          Radiology Studies: DG Foot Complete Left  Result Date: 10/17/2019 CLINICAL DATA:  Foot surgery removal of implant EXAM: DG C-ARM 1-60 MIN; LEFT FOOT - COMPLETE 3+ VIEW CONTRAST:  None FLUOROSCOPY TIME:  Fluoroscopy Time:  52 seconds Number of Acquired Spot Images: 2 COMPARISON:  10/14/2019 FINDINGS: Two low resolution intraoperative spot views of the left foot. Removal of first MTP implant with presence of rounded radiopaque material in the region. Placement of external fixation devices over the first digit and tarsal bones. IMPRESSION: Intraoperative fluoroscopic assistance provided during left foot surgery Electronically Signed   By: Donavan Foil M.D.   On: 10/17/2019 19:45   DG C-Arm 1-60 Min  Result Date: 10/17/2019 CLINICAL DATA:  Foot surgery removal of implant EXAM: DG C-ARM 1-60 MIN; LEFT FOOT - COMPLETE 3+ VIEW CONTRAST:  None FLUOROSCOPY TIME:  Fluoroscopy Time:  52 seconds Number of Acquired Spot Images: 2 COMPARISON:  10/14/2019 FINDINGS: Two low resolution intraoperative spot views of the left foot. Removal of first MTP implant with presence of rounded radiopaque material in the region. Placement of external fixation devices over the first digit and tarsal bones. IMPRESSION: Intraoperative fluoroscopic assistance provided during left foot surgery Electronically Signed   By: Donavan Foil M.D.   On: 10/17/2019 19:45   [place] EKG SITE RITE  Result Date: 10/17/2019 If Site Rite image not attached, placement could not be confirmed due to current cardiac rhythm.       Scheduled Meds:  aspirin EC  81 mg Oral Daily   brimonidine  1 drop Both Eyes BID   Chlorhexidine Gluconate Cloth  6 each Topical Daily   dorzolamide-timolol  1 drop Both Eyes BID   enoxaparin (LOVENOX) injection  40 mg Subcutaneous Q24H   ferrous sulfate  325 mg Oral Q breakfast   gabapentin  300 mg Oral q morning  - 10a   And   gabapentin  600 mg Oral QHS   latanoprost  1 drop Both Eyes QHS   multivitamin with minerals  1 tablet Oral Daily   omega-3 acid ethyl esters  1 g Oral Daily   sodium chloride flush  10-40 mL Intracatheter Q12H   Continuous Infusions:  ertapenem 1,000 mg (10/18/19 1524)     LOS: 5 days    Time spent:40 min    Malky Rudzinski, Geraldo Docker, MD [facility] Pager [phone]  If 7PM-7AM, please contact night-coverage [email or web address] Password [facility] 10/19/2019, 7:46 AM

## 2019-10-19 NOTE — Consult Note (Addendum)
Center Nurse Consult Note: Reason for Consult: Consult requested to change Vac dressing to left foot.  Pt had surgery and is followed by podiatry for plan of care.  External fixator and 2 full thickness post-op wounds to left foot.  Measurement: Medicated for pain prior to the procedure and tolerated with mod amt discomfort.  Anterior foot with red moist wounds; 5X2X.3cm and 3X2X.3cm, separated by narrow bridge of skin.  visible tendon near great toe. Applied a barrier ring to attempt to maintain a seal aound the edges, then Mepitel contact layer and 2 pieces black foam and covered with drape and attached to 11mm cont suction.  Small amt pink drainage, skin surrounding wound edges is grey and macerated. Sealed edges and around toes with pink tape and applied ace wrap.  Heel floated to reduce pressure.  Home Vac machine and supplies in the room and charger plugged in for use after discharge.  Pt will need home health assistance after discharge for dressing changes 3 times a week. Julien Girt MSN, RN, [place], [place], [place]

## 2019-10-19 NOTE — Progress Notes (Signed)
,  Subjective:  Patient ID: Alexis Ball, female    DOB: 05-07-1955,  MRN: [id number]  Patient seen bedside. Underwent VAC change today and tolerated well.  Objective:   Vitals:   10/19/19 1544 10/19/19 2008  BP: (!) 156/78 (!) 180/65  Pulse: 68 70  Resp: 17 16  Temp: 98.1 F (36.7 C) 97.7 F (36.5 C)  SpO2: 99% 100%   General AA&O x3. Normal mood and affect.  Vascular  foot warm and well-perfused  Neurologic Epicritic sensation grossly intact.  Dermatologic Dressing clean dry and intact. VAC on and funcitoning at with minimal drainage in cannister.  Orthopedic: Positive motor to the digits   Results for orders placed or performed during the hospital encounter of 10/13/19  SARS Coronavirus 2 by RT PCR (hospital order, performed in [facility] hospital lab) Nasopharyngeal Nasopharyngeal Swab     Status: None   Collection Time: 10/14/19  4:46 AM   Specimen: Nasopharyngeal Swab  Result Value Ref Range Status   SARS Coronavirus 2 NEGATIVE NEGATIVE Final    Comment: (NOTE) SARS-CoV-2 target nucleic acids are NOT DETECTED.  The SARS-CoV-2 RNA is generally detectable in upper and lower respiratory specimens during the acute phase of infection. The lowest concentration of SARS-CoV-2 viral copies this assay can detect is 250 copies / mL. A negative result does not preclude SARS-CoV-2 infection and should not be used as the sole basis for treatment or other patient management decisions.  A negative result may occur with improper specimen collection / handling, submission of specimen other than nasopharyngeal swab, presence of viral mutation(s) within the areas targeted by this assay, and inadequate number of viral copies (<250 copies / mL). A negative result must be combined with clinical observations, patient history, and epidemiological information.  Fact Sheet for Patients:   BoilerBrush.com.cy  Fact Sheet for Healthcare  Providers: https://pope.com/  This test is not yet approved or  cleared by the [place] FDA and has been authorized for detection and/or diagnosis of SARS-CoV-2 by FDA under an Emergency Use Authorization (EUA).  This EUA will remain in effect (meaning this test can be used) for the duration of the COVID-19 declaration under Section 564(b)(1) of the Act, 21 U.S.C. section 360bbb-3(b)(1), unless the authorization is terminated or revoked sooner.  Performed at [facility], [address] [place], [place] [address]   Aerobic/Anaerobic Culture (surgical/deep wound)     Status: None   Collection Time: 10/14/19  2:37 PM   Specimen: Soft Tissue, Other; Body Fluid  Result Value Ref Range Status   Specimen Description TISSUE SWAB  Final   Special Requests 2ND LEFTFOOT  Final   Gram Stain NO WBC SEEN NO ORGANISMS SEEN   Final   Culture   Final    FEW ENTEROBACTER CLOACAE NO ANAEROBES ISOLATED Performed at [facility], [address], [place], [place] [address]    Report Status 10/19/2019 FINAL  Final   Organism ID, Bacteria ENTEROBACTER CLOACAE  Final      Susceptibility   Enterobacter cloacae - MIC*    CEFAZOLIN >=64 RESISTANT Resistant     CEFEPIME <=0.12 SENSITIVE Sensitive     CEFTAZIDIME <=1 SENSITIVE Sensitive     CIPROFLOXACIN <=0.25 SENSITIVE Sensitive     GENTAMICIN <=1 SENSITIVE Sensitive     IMIPENEM 0.5 SENSITIVE Sensitive     TRIMETH/SULFA <=20 SENSITIVE Sensitive     PIP/TAZO <=4 SENSITIVE Sensitive     * FEW ENTEROBACTER CLOACAE  Aerobic/Anaerobic  Culture (surgical/deep wound)     Status: None   Collection Time: 10/14/19  2:42 PM   Specimen: Soft Tissue, Other; Body Fluid  Result Value Ref Range Status   Specimen Description BONE  Final   Special Requests PROXIMAL PHALANX LEFT  Final   Gram Stain   Final    RARE WBC PRESENT, PREDOMINANTLY PMN RARE GRAM POSITIVE COCCI RARE GRAM VARIABLE ROD Performed at [facility], [place] [address] [place], [place] [address]    Culture FEW ENTEROBACTER CLOACAE FEW Meredith Pel   Final   Report Status 10/17/2019 FINAL  Final   Organism ID, Bacteria ENTEROBACTER CLOACAE  Final      Susceptibility   Enterobacter cloacae - MIC*    CEFAZOLIN >=64 RESISTANT Resistant     CEFEPIME <=0.12 SENSITIVE Sensitive     CEFTAZIDIME <=1 SENSITIVE Sensitive     CIPROFLOXACIN <=0.25 SENSITIVE Sensitive     GENTAMICIN <=1 SENSITIVE Sensitive     IMIPENEM 0.5 SENSITIVE Sensitive     TRIMETH/SULFA <=20 SENSITIVE Sensitive     PIP/TAZO <=4 SENSITIVE Sensitive     * FEW ENTEROBACTER CLOACAE  Aerobic/Anaerobic Culture (surgical/deep wound)     Status: None   Collection Time: 10/14/19  2:43 PM   Specimen: Other Source; Tissue  Result Value Ref Range Status   Specimen Description TISSUE SWAB  Final   Special Requests 2ND INTERSPACE  Final   Gram Stain   Final    RARE WBC PRESENT, PREDOMINANTLY PMN NO ORGANISMS SEEN    Culture   Final    FEW ENTEROBACTER CLOACAE NO ANAEROBES ISOLATED Performed at [facility], [address], [place], [place] [address]    Report Status 10/19/2019 FINAL  Final   Organism ID, Bacteria ENTEROBACTER CLOACAE  Final      Susceptibility   Enterobacter cloacae - MIC*    CEFAZOLIN >=64 RESISTANT Resistant     CEFEPIME <=0.12 SENSITIVE Sensitive     CEFTAZIDIME <=1 SENSITIVE Sensitive     CIPROFLOXACIN <=0.25 SENSITIVE Sensitive     GENTAMICIN <=1 SENSITIVE Sensitive     IMIPENEM 0.5 SENSITIVE Sensitive     TRIMETH/SULFA <=20 SENSITIVE Sensitive     PIP/TAZO <=4 SENSITIVE Sensitive     * FEW ENTEROBACTER CLOACAE  Surgical pcr screen     Status: None   Collection Time: 10/16/19  6:42 PM   Specimen: Nasal Mucosa; Nasal Swab  Result Value Ref Range Status   MRSA, PCR NEGATIVE NEGATIVE Final   Staphylococcus aureus NEGATIVE NEGATIVE Final    Comment: (NOTE) The Xpert SA Assay (FDA approved for NASAL specimens in patients 80 years  of age and older), is one component of a comprehensive surveillance program. It is not intended to diagnose infection nor to guide or monitor treatment. Performed at [facility], [place] [address], [place], [place] [address]     Assessment & Plan:  Patient was evaluated and treated and all questions answered.  L Foot Osteomyelitis s/p ROH, Application of External Fixator, Insertion of Abx Spacer, Debridement and Irrigation of Wounds -Abx per ID. -PICC line in place -Wound VAC at D/c -Wound care: Continue wVAC. Change MWF. -Heel WBAT in CAM boot  Ok for d/c from podiatry standpoint once home health care / SNF needs met.  Evelina Bucy, DPM  Accessible via secure chat for questions or concerns.

## 2019-10-20 DIAGNOSIS — R52 Pain, unspecified: Secondary | ICD-10-CM

## 2019-10-20 DIAGNOSIS — L02612 Cutaneous abscess of left foot: Secondary | ICD-10-CM | POA: Diagnosis not present

## 2019-10-20 DIAGNOSIS — M71072 Abscess of bursa, left ankle and foot: Secondary | ICD-10-CM | POA: Diagnosis not present

## 2019-10-20 DIAGNOSIS — E876 Hypokalemia: Secondary | ICD-10-CM | POA: Diagnosis not present

## 2019-10-20 LAB — COMPREHENSIVE METABOLIC PANEL
ALT: 23 U/L (ref 0–44)
AST: 27 U/L (ref 15–41)
Albumin: 2.3 g/dL — ABNORMAL LOW (ref 3.5–5.0)
Alkaline Phosphatase: 62 U/L (ref 38–126)
Anion gap: 6 (ref 5–15)
BUN: 9 mg/dL (ref 8–23)
CO2: 25 mmol/L (ref 22–32)
Calcium: 8.8 mg/dL — ABNORMAL LOW (ref 8.9–10.3)
Chloride: 107 mmol/L (ref 98–111)
Creatinine, Ser: 0.74 mg/dL (ref 0.44–1.00)
GFR calc Af Amer: >60 mL/min (ref >60)
GFR calc non Af Amer: >60 mL/min (ref >60)
Glucose, Bld: 107 mg/dL — ABNORMAL HIGH (ref 70–99)
Potassium: 4.4 mmol/L (ref 3.5–5.1)
Sodium: 138 mmol/L (ref 135–145)
Total Bilirubin: 0.4 mg/dL (ref 0.3–1.2)
Total Protein: 5.7 g/dL — ABNORMAL LOW (ref 6.5–8.1)

## 2019-10-20 LAB — CBC WITH DIFFERENTIAL/PLATELET
Abs Immature Granulocytes: 0.08 10*3/uL — ABNORMAL HIGH (ref 0.00–0.07)
Basophils Absolute: 0 10*3/uL (ref 0.0–0.1)
Basophils Relative: 0 %
Eosinophils Absolute: 0.2 10*3/uL (ref 0.0–0.5)
Eosinophils Relative: 2 %
HCT: 30 % — ABNORMAL LOW (ref 36.0–46.0)
Hemoglobin: 9.6 g/dL — ABNORMAL LOW (ref 12.0–15.0)
Immature Granulocytes: 1 %
Lymphocytes Relative: 24 %
Lymphs Abs: 2.1 10*3/uL (ref 0.7–4.0)
MCH: 29.5 pg (ref 26.0–34.0)
MCHC: 32 g/dL (ref 30.0–36.0)
MCV: 92.3 fL (ref 80.0–100.0)
Monocytes Absolute: 0.6 10*3/uL (ref 0.1–1.0)
Monocytes Relative: 7 %
Neutro Abs: 5.9 10*3/uL (ref 1.7–7.7)
Neutrophils Relative %: 66 %
Platelets: 336 10*3/uL (ref 150–400)
RBC: 3.25 MIL/uL — ABNORMAL LOW (ref 3.87–5.11)
RDW: 12.3 % (ref 11.5–15.5)
WBC: 8.9 10*3/uL (ref 4.0–10.5)
nRBC: 0 % (ref 0.0–0.2)

## 2019-10-20 LAB — PHOSPHORUS: Phosphorus: 3.1 mg/dL (ref 2.5–4.6)

## 2019-10-20 LAB — MAGNESIUM: Magnesium: 1.9 mg/dL (ref 1.7–2.4)

## 2019-10-20 MED ORDER — LISINOPRIL 5 MG PO TABS
2.5000 mg | ORAL_TABLET | Freq: Every day | ORAL | Status: DC
  Administered 2019-10-20 – 2019-11-09 (×21): 2.5 mg via ORAL
  Filled 2019-10-20 (×21): qty 1

## 2019-10-20 MED ORDER — OXYCODONE HCL ER 10 MG PO T12A
10.0000 mg | EXTENDED_RELEASE_TABLET | Freq: Two times a day (BID) | ORAL | Status: DC
  Administered 2019-10-20 – 2019-11-09 (×42): 10 mg via ORAL
  Filled 2019-10-20 (×42): qty 1

## 2019-10-20 NOTE — Progress Notes (Signed)
PROGRESS NOTE    Alexis Ball  TOI:[id number] DOB: 1955/10/16 DOA: 10/13/2019 PCP: Lucianne Lei, MD     Brief Narrative:  64 y.o. BF PMHx HTN, remote CVA, and recentbunionectomy with silicone implant of her left foot on June 25.  Patient started to have increasing pain and drainage from thesurgicalwoundaftershe tripped and fell down on the foot last week. She went to podiatrist office 3 days ago, x-ray showed no significant displacement and of the implant and MTP joints, but there was a local infection going on, podiatrist ordered clindamycin plus Keflex regimen which she has been taking. But today, she noticed that the wound is "busted open"with maggotscoming out.     Subjective: 7/10 A/O x4, negative S OB, negative CP, negative abdominal pain.  Patient experiencing breakthrough pain on her LEFT foot.    Assessment & Plan: Covid vaccination;   Active Problems:   Abscess of bursa of left foot   Foot abscess, left   Wound infection  LEFT forefoot abscess/cellulitis/osteomyelitis -7/4 s/p I&D by Dr. March Rummage -7/7 s/p LEFT foot ORIF see results below -ID recommends 6 weeks of IV cefepime after amputation. -OPATorders and home health vs SNF -ID will follow up with in office. -7/8 DC cefepime---> Entapenem  Essential HTN -7/10 lisinopril 2.5 mg daily  Hypokalemia* -K-Dur 40 mEq x 2  Hypomagnesmia -Magnesium IV 2 g  History of CVA -No acute issue, continue aspirin  Chronic iron deficiency anemia -Ferrous sulfate 325 mg daily + vitamin C 500 mg daily  -outpatient GI follow-up  Obese Body mass index is 32.06 kg/m.  Pain control -Oxycodone 10 mg BID -Vicodin 5-325 mg 1-2 tab QID PRN breakthrough pain   DVT prophylaxis:  Code Status: Full Family Communication: 7/10 daughters present at bedside for discussion of plan of care Status is: Inpatient    Dispo: The patient is from: Home              Anticipated d/c is to: 7/9 after long discussion  with patient understands that going home without any sort of home health care is a nonstarter, and that she needs SNF. Patient has agreed to go to SNF              Anticipated d/c date is: 7/14              Patient currently unstable in surgery      Consultants:  Podiatry  ID   Procedures/Significant Events:  7/4 s/p I&D LEFT foot with bone biopsy by Dr. March Rummage 7/7Left Foot; Removal of Silicone Implant; Insertion of Antibiotic Spacer; Application of External Fixator; Possible Application of Wound VAC (Left)    I have personally reviewed and interpreted all radiology studies and my findings are as above.  VENTILATOR SETTINGS:    Cultures 7/4 LEFT foot positive Enterobacter Cloacae 7/4 LEFT proximal phalanx positive Enterobacter Cloacae, FINEGOLDIA MAGNA 7/4 she is well second interspace positive Enterobacter Cloacae 7/4 MRSA by PCR negative 7/4 staph aureus negative 7/4 SARS coronavirus negative     Antimicrobials: Anti-infectives (From admission, onward)   Start     Ordered Stop   10/18/19 1400  ertapenem (INVANZ) 1,000 mg in sodium chloride 0.9 % 100 mL IVPB     Discontinue     10/18/19 0909     10/17/19 1739  vancomycin (VANCOCIN) powder  Status:  Discontinued        10/17/19 1740 10/17/19 1820   10/16/19 1400  ceFEPIme (MAXIPIME) 2 g in sodium chloride 0.9 % 100  mL IVPB  Status:  Discontinued        10/16/19 1233 10/18/19 0909   10/15/19 2100  vancomycin (VANCOCIN) IVPB 1000 mg/200 mL premix  Status:  Discontinued        10/15/19 1155 10/16/19 1233   10/15/19 0800  vancomycin (VANCOREADY) IVPB 750 mg/150 mL  Status:  Discontinued        10/14/19 1806 10/15/19 1155   10/14/19 1830  piperacillin-tazobactam (ZOSYN) IVPB 3.375 g  Status:  Discontinued        10/14/19 1806 10/16/19 1234   10/14/19 1800  vancomycin (VANCOREADY) IVPB 1500 mg/300 mL        10/14/19 1745 10/14/19 2051   10/14/19 1440  vancomycin (VANCOCIN) powder  Status:  Discontinued        10/14/19  1531 10/14/19 1534   10/14/19 1400  ceFAZolin (ANCEF) IVPB 1 g/50 mL premix  Status:  Discontinued        10/14/19 1135 10/14/19 1741   10/14/19 1000  doxycycline (VIBRA-TABS) tablet 100 mg  Status:  Discontinued        10/14/19 0840 10/14/19 1745   10/14/19 0630  piperacillin-tazobactam (ZOSYN) IVPB 3.375 g        10/14/19 0623 10/14/19 0736       Devices    LINES / TUBES:  Right arm PICC line>>>    Continuous Infusions: . ertapenem 1,000 mg (10/19/19 1336)     Objective: Vitals:   10/19/19 1544 10/19/19 2008 10/20/19 0326 10/20/19 0925  BP: (!) 156/78 (!) 180/65 135/78 (!) 152/81  Pulse: 68 70 70 69  Resp: 17 16 15 17   Temp: 98.1 F (36.7 C) 97.7 F (36.5 C) 98 F (36.7 C) 98.5 F (36.9 C)  TempSrc: Oral Oral Oral Oral  SpO2: 99% 100% 100% 100%  Weight:      Height:        Intake/Output Summary (Last 24 hours) at 10/20/2019 0955 Last data filed at 10/20/2019 0700 Gross per 24 hour  Intake 240 ml  Output 900 ml  Net -660 ml   Filed Weights   10/13/19 2329 10/17/19 1452  Weight: 82.1 kg 82.1 kg   Physical Exam:  General: A/O x4, No acute respiratory distress Eyes: negative scleral hemorrhage, negative anisocoria, negative icterus ENT: Negative Runny nose, negative gingival bleeding, Neck:  Negative scars, masses, torticollis, lymphadenopathy, JVD Lungs: Clear to auscultation bilaterally without wheezes or crackles Cardiovascular: Regular rate and rhythm without murmur gallop or rub normal S1 and S2 Abdomen: negative abdominal pain, nondistended, positive soft, bowel sounds, no rebound, no ascites, no appreciable mass Extremities: LEFT foot ORIF and Ace wrap with wound VAC in place. Minimal serosanguineous fluid being suctioned by wound VAC. Skin: Negative rashes, lesions, ulcers Psychiatric:  Negative depression, negative anxiety, negative fatigue, negative mania  Central nervous system:  Cranial nerves II through XII intact, tongue/uvula midline, all  extremities muscle strength 5/5, sensation intact throughout,  negative dysarthria, negative expressive aphasia, negative receptive aphasia.    Data Reviewed: Care during the described time interval was provided by me .  I have reviewed this patient's available data, including medical history, events of note, physical examination, and all test results as part of my evaluation.  CBC: Recent Labs  Lab 10/14/19 0437 10/14/19 0437 10/15/19 0356 10/16/19 0424 10/18/19 1128 10/19/19 0440 10/20/19 0412  WBC 11.6*   < > 11.0* 8.5 10.6* 8.5 8.9  NEUTROABS 8.4*  --   --   --  7.3 5.3 5.9  HGB 11.9*   < > 10.9* 10.1* 10.1* 9.6* 9.6*  HCT 36.5   < > 34.3* 31.9* 31.5* 30.6* 30.0*  MCV 92.2   < > 93.0 93.3 92.1 93.0 92.3  PLT 439*   < > 425* 376 363 336 336   < > = values in this interval not displayed.   Basic Metabolic Panel: Recent Labs  Lab 10/15/19 0356 10/16/19 0424 10/18/19 1128 10/19/19 0440 10/20/19 0412  NA 138 138 139 139 138  K 3.9 3.3* 3.8 3.5 4.4  CL 103 104 108 108 107  CO2 26 24 24 25 25   GLUCOSE 120* 116* 109* 121* 107*  BUN 15 11 9 10 9   CREATININE 0.83 0.93 0.79 0.73 0.74  CALCIUM 8.7* 8.6* 8.7* 8.5* 8.8*  MG  --   --  1.7 1.8 1.9  PHOS  --   --  2.4* 3.3 3.1   GFR: Estimated Creatinine Clearance: 74 mL/min (by C-G formula based on SCr of 0.74 mg/dL). Liver Function Tests: Recent Labs  Lab 10/18/19 1128 10/19/19 0440 10/20/19 0412  AST 24 28 27   ALT 21 23 23   ALKPHOS 59 52 62  BILITOT 0.4 0.4 0.4  PROT 5.9* 5.5* 5.7*  ALBUMIN 2.4* 2.1* 2.3*   No results for input(s): LIPASE, AMYLASE in the last 168 hours. No results for input(s): AMMONIA in the last 168 hours. Coagulation Profile: No results for input(s): INR, PROTIME in the last 168 hours. Cardiac Enzymes: No results for input(s): CKTOTAL, CKMB, CKMBINDEX, TROPONINI in the last 168 hours. BNP (last 3 results) No results for input(s): PROBNP in the last 8760 hours. HbA1C: No results for  input(s): HGBA1C in the last 72 hours. CBG: No results for input(s): GLUCAP in the last 168 hours. Lipid Profile: No results for input(s): CHOL, HDL, LDLCALC, TRIG, CHOLHDL, LDLDIRECT in the last 72 hours. Thyroid Function Tests: No results for input(s): TSH, T4TOTAL, FREET4, T3FREE, THYROIDAB in the last 72 hours. Anemia Panel: No results for input(s): VITAMINB12, FOLATE, FERRITIN, TIBC, IRON, RETICCTPCT in the last 72 hours. Sepsis Labs: No results for input(s): PROCALCITON, LATICACIDVEN in the last 168 hours.  Recent Results (from the past 240 hour(s))  SARS Coronavirus 2 by RT PCR (hospital order, performed in [facility] hospital lab) Nasopharyngeal Nasopharyngeal Swab     Status: None   Collection Time: 10/14/19  4:46 AM   Specimen: Nasopharyngeal Swab  Result Value Ref Range Status   SARS Coronavirus 2 NEGATIVE NEGATIVE Final    Comment: (NOTE) SARS-CoV-2 target nucleic acids are NOT DETECTED.  The SARS-CoV-2 RNA is generally detectable in upper and lower respiratory specimens during the acute phase of infection. The lowest concentration of SARS-CoV-2 viral copies this assay can detect is 250 copies / mL. A negative result does not preclude SARS-CoV-2 infection and should not be used as the sole basis for treatment or other patient management decisions.  A negative result may occur with improper specimen collection / handling, submission of specimen other than nasopharyngeal swab, presence of viral mutation(s) within the areas targeted by this assay, and inadequate number of viral copies (<250 copies / mL). A negative result must be combined with clinical observations, patient history, and epidemiological information.  Fact Sheet for Patients:   StrictlyIdeas.no  Fact Sheet for Healthcare Providers: BankingDealers.co.za  This test is not yet approved or  cleared by the [place] FDA and has been authorized for detection  and/or diagnosis of SARS-CoV-2 by FDA under an Emergency Use Authorization (EUA).  This EUA will remain in effect (meaning this test can be used) for the duration of the COVID-19 declaration under Section 564(b)(1) of the Act, 21 U.S.C. section 360bbb-3(b)(1), unless the authorization is terminated or revoked sooner.  Performed at [facility], [place] [address], [place], [place] [address]   Aerobic/Anaerobic Culture (surgical/deep wound)     Status: None   Collection Time: 10/14/19  2:37 PM   Specimen: Soft Tissue, Other; Body Fluid  Result Value Ref Range Status   Specimen Description TISSUE SWAB  Final   Special Requests 2ND LEFTFOOT  Final   Gram Stain NO WBC SEEN NO ORGANISMS SEEN   Final   Culture   Final    FEW ENTEROBACTER CLOACAE NO ANAEROBES ISOLATED Performed at [facility], [address] [place], [place] [address]    Report Status 10/19/2019 FINAL  Final   Organism ID, Bacteria ENTEROBACTER CLOACAE  Final      Susceptibility   Enterobacter cloacae - MIC*    CEFAZOLIN >=64 RESISTANT Resistant     CEFEPIME <=0.12 SENSITIVE Sensitive     CEFTAZIDIME <=1 SENSITIVE Sensitive     CIPROFLOXACIN <=0.25 SENSITIVE Sensitive     GENTAMICIN <=1 SENSITIVE Sensitive     IMIPENEM 0.5 SENSITIVE Sensitive     TRIMETH/SULFA <=20 SENSITIVE Sensitive     PIP/TAZO <=4 SENSITIVE Sensitive     * FEW ENTEROBACTER CLOACAE  Aerobic/Anaerobic Culture (surgical/deep wound)     Status: None   Collection Time: 10/14/19  2:42 PM   Specimen: Soft Tissue, Other; Body Fluid  Result Value Ref Range Status   Specimen Description BONE  Final   Special Requests PROXIMAL PHALANX LEFT  Final   Gram Stain   Final    RARE WBC PRESENT, PREDOMINANTLY PMN RARE GRAM POSITIVE COCCI RARE GRAM VARIABLE ROD Performed at [facility], [place] [address] [place], [place] [address]    Culture FEW ENTEROBACTER CLOACAE FEW Meredith Pel   Final   Report Status 10/17/2019 FINAL  Final    Organism ID, Bacteria ENTEROBACTER CLOACAE  Final      Susceptibility   Enterobacter cloacae - MIC*    CEFAZOLIN >=64 RESISTANT Resistant     CEFEPIME <=0.12 SENSITIVE Sensitive     CEFTAZIDIME <=1 SENSITIVE Sensitive     CIPROFLOXACIN <=0.25 SENSITIVE Sensitive     GENTAMICIN <=1 SENSITIVE Sensitive     IMIPENEM 0.5 SENSITIVE Sensitive     TRIMETH/SULFA <=20 SENSITIVE Sensitive     PIP/TAZO <=4 SENSITIVE Sensitive     * FEW ENTEROBACTER CLOACAE  Aerobic/Anaerobic Culture (surgical/deep wound)     Status: None   Collection Time: 10/14/19  2:43 PM   Specimen: Other Source; Tissue  Result Value Ref Range Status   Specimen Description TISSUE SWAB  Final   Special Requests 2ND INTERSPACE  Final   Gram Stain   Final    RARE WBC PRESENT, PREDOMINANTLY PMN NO ORGANISMS SEEN    Culture   Final    FEW ENTEROBACTER CLOACAE NO ANAEROBES ISOLATED Performed at [facility], [address], [place],  [address]    Report Status 10/19/2019 FINAL  Final   Organism ID, Bacteria ENTEROBACTER CLOACAE  Final      Susceptibility   Enterobacter cloacae - MIC*    CEFAZOLIN >=64 RESISTANT Resistant     CEFEPIME <=0.12 SENSITIVE Sensitive     CEFTAZIDIME <=1 SENSITIVE Sensitive     CIPROFLOXACIN <=0.25 SENSITIVE Sensitive  GENTAMICIN <=1 SENSITIVE Sensitive     IMIPENEM 0.5 SENSITIVE Sensitive     TRIMETH/SULFA <=20 SENSITIVE Sensitive     PIP/TAZO <=4 SENSITIVE Sensitive     * FEW ENTEROBACTER CLOACAE  Surgical pcr screen     Status: None   Collection Time: 10/16/19  6:42 PM   Specimen: Nasal Mucosa; Nasal Swab  Result Value Ref Range Status   MRSA, PCR NEGATIVE NEGATIVE Final   Staphylococcus aureus NEGATIVE NEGATIVE Final    Comment: (NOTE) The Xpert SA Assay (FDA approved for NASAL specimens in patients 74 years of age and older), is one component of a comprehensive surveillance program. It is not intended to diagnose infection nor to guide or monitor treatment. Performed  at [facility], [place] [address], [place], [place] [address]          Radiology Studies: No results found.      Scheduled Meds: . vitamin C  500 mg Oral Daily  . aspirin EC  81 mg Oral Daily  . brimonidine  1 drop Both Eyes BID  . Chlorhexidine Gluconate Cloth  6 each Topical Daily  . dorzolamide-timolol  1 drop Both Eyes BID  . enoxaparin (LOVENOX) injection  40 mg Subcutaneous Q24H  . ferrous sulfate  325 mg Oral Q breakfast  . gabapentin  300 mg Oral q morning - 10a   And  . gabapentin  600 mg Oral QHS  . latanoprost  1 drop Both Eyes QHS  . multivitamin with minerals  1 tablet Oral Daily  . omega-3 acid ethyl esters  1 g Oral Daily  . sodium chloride flush  10-40 mL Intracatheter Q12H   Continuous Infusions: . ertapenem 1,000 mg (10/19/19 1336)     LOS: 6 days    Time spent:40 min    Slayter Moorhouse, Geraldo Docker, MD [facility] Pager [phone]  If 7PM-7AM, please contact night-coverage [email or web address] Password [facility] 10/20/2019, 9:55 AM

## 2019-10-20 NOTE — Progress Notes (Signed)
Physical Therapy Treatment Patient Details Name: Alexis Ball MRN: [id number] DOB: Jul 08, 1955 Today's Date: 10/20/2019    History of Present Illness Pt is a 64 yo female presenting s/p surgical drainage of L foot infection 7/4 with bone biopsy due to concern for possible osteomyelitis after bunionectomy with silicone implant on 5/78. Further examination reports osteomyelitis with infection of hardware.  Pt s/p I&D with hard ware removal/silicon implant removal and now ex fix and antibiotic spacer placed.  Surgeon also placed wound vac after procedure.   PMH includes: HTN, and stroke.    PT Comments    Pt in bed upon arrival of PT, agreeable to session with focus on progression of functional ambulation endurance and independence with transfers. The pt was able to complete a bout of hallway ambulation with minG and use of RW, but remains limited by endurance and increasing pain in her LLE with mobility and requires x3 standing rest breaks prior to completion of the activity. The pt was also able to demo good stability with transfers and performed with minG for safety. The pt will continue to benefit from skilled PT to further progress functional strength for transfers and ambulation endurance, as well as to complete stair training prior to d/c. The pt is expecting d/c to her daughters house which has 3 different single steps to enter, and will therefore benefit from education on safe curb navigation.    Follow Up Recommendations  Home health PT;Supervision/Assistance - 24 hour     Equipment Recommendations  None recommended by PT    Recommendations for Other Services       Precautions / Restrictions Precautions Precautions: Fall Required Braces or Orthoses: Other Brace Other Brace: CAM boot LLE- difficult to tighten over ex fix. Restrictions Weight Bearing Restrictions: Yes LLE Weight Bearing: Partial weight bearing LLE Partial Weight Bearing Percentage or Pounds: heel only with cam  boot    Mobility  Bed Mobility Overal bed mobility: Needs Assistance Bed Mobility: Supine to Sit     Supine to sit: Min assist     General bed mobility comments: pt asking for minA to RLE to get to EOB and to lower slowly at EOB.  Transfers Overall transfer level: Needs assistance Equipment used: Rolling walker (2 wheeled) Transfers: Sit to/from [facility] Sit to Stand: Min guard Stand pivot transfers: Min guard       General transfer comment: Min guard for safety. Stood from [employer], BSC x1. Cues for hand placement.  Ambulation/Gait Ambulation/Gait assistance: Min guard Gait Distance (Feet): 60 Feet Assistive device: Rolling walker (2 wheeled) Gait Pattern/deviations: Step-to pattern;Decreased stride length;Trunk flexed Gait velocity: decreased Gait velocity interpretation: <1.31 ft/sec, indicative of household ambulator General Gait Details: Pt required cues for weight bearing and safety with RW. Pt with improved upright posture today but still mild trunk flexion, requires x3 standing rest breaks due to reports of pain          Balance Overall balance assessment: Needs assistance Sitting-balance support: Feet supported;No upper extremity supported Sitting balance-Leahy Scale: Good Sitting balance - Comments: abe to complete pericare on BSC without assist or LOB   Standing balance support: During functional activity Standing balance-Leahy Scale: Poor Standing balance comment: Requires UE support in standing, able to maintain static stand with single UE support                            Cognition Arousal/Alertness: Awake/alert Behavior During Therapy: [facility] for  tasks assessed/performed Overall Cognitive Status: Within Functional Limits for tasks assessed                                 General Comments: pt with good recal of WB status, good adherence with mobility, agreeable to all education             Pertinent  Vitals/Pain Pain Assessment: Faces Faces Pain Scale: Hurts little more Pain Location: surgical site Pain Descriptors / Indicators: Sore Pain Intervention(s): Limited activity within patient's tolerance;Monitored during session;Repositioned           PT Goals (current goals can now be found in the care plan section) Acute Rehab PT Goals Patient Stated Goal: return home PT Goal Formulation: With patient Time For Goal Achievement: 10/29/19 Potential to Achieve Goals: Good Progress towards PT goals: Progressing toward goals    Frequency    Min 3X/week      PT Plan Current plan remains appropriate       AM-PAC PT "6 Clicks" Mobility   Outcome Measure  Help needed turning from your back to your side while in a flat bed without using bedrails?: None Help needed moving from lying on your back to sitting on the side of a flat bed without using bedrails?: None Help needed moving to and from a bed to a chair (including a wheelchair)?: A Little Help needed standing up from a chair using your arms (e.g., wheelchair or bedside chair)?: A Little Help needed to walk in hospital room?: A Little Help needed climbing 3-5 steps with a railing? : A Little 6 Click Score: 20    End of Session Equipment Utilized During Treatment: Gait belt;Other (comment) (cam boot) Activity Tolerance: Patient tolerated treatment well Patient left: in chair;with call bell/phone within reach;with family/visitor present Nurse Communication: Mobility status PT Visit Diagnosis: Other abnormalities of gait and mobility (R26.89);Muscle weakness (generalized) (M62.81);Pain Pain - Right/Left: Left Pain - part of body: Ankle and joints of foot     Time: 7124-5809 PT Time Calculation (min) (ACUTE ONLY): 38 min  Charges:  $Gait Training: 23-37 mins $Therapeutic Activity: 8-22 mins                     Karma Ganja, PT, DPT   Acute Rehabilitation Department Pager #: [phone]   Otho Bellows 10/20/2019,  3:08 PM

## 2019-10-21 DIAGNOSIS — M71072 Abscess of bursa, left ankle and foot: Secondary | ICD-10-CM | POA: Diagnosis not present

## 2019-10-21 DIAGNOSIS — E876 Hypokalemia: Secondary | ICD-10-CM | POA: Diagnosis not present

## 2019-10-21 DIAGNOSIS — L02612 Cutaneous abscess of left foot: Secondary | ICD-10-CM | POA: Diagnosis not present

## 2019-10-21 LAB — PHOSPHORUS: Phosphorus: 3.8 mg/dL (ref 2.5–4.6)

## 2019-10-21 LAB — COMPREHENSIVE METABOLIC PANEL
ALT: 19 U/L (ref 0–44)
AST: 21 U/L (ref 15–41)
Albumin: 2.3 g/dL — ABNORMAL LOW (ref 3.5–5.0)
Alkaline Phosphatase: 58 U/L (ref 38–126)
Anion gap: 6 (ref 5–15)
BUN: 11 mg/dL (ref 8–23)
CO2: 27 mmol/L (ref 22–32)
Calcium: 8.8 mg/dL — ABNORMAL LOW (ref 8.9–10.3)
Chloride: 104 mmol/L (ref 98–111)
Creatinine, Ser: 0.72 mg/dL (ref 0.44–1.00)
GFR calc Af Amer: >60 mL/min (ref >60)
GFR calc non Af Amer: >60 mL/min (ref >60)
Glucose, Bld: 105 mg/dL — ABNORMAL HIGH (ref 70–99)
Potassium: 4.2 mmol/L (ref 3.5–5.1)
Sodium: 137 mmol/L (ref 135–145)
Total Bilirubin: 0.2 mg/dL — ABNORMAL LOW (ref 0.3–1.2)
Total Protein: 5.8 g/dL — ABNORMAL LOW (ref 6.5–8.1)

## 2019-10-21 LAB — CBC WITH DIFFERENTIAL/PLATELET
Abs Immature Granulocytes: 0.09 10*3/uL — ABNORMAL HIGH (ref 0.00–0.07)
Basophils Absolute: 0.1 10*3/uL (ref 0.0–0.1)
Basophils Relative: 1 %
Eosinophils Absolute: 0.2 10*3/uL (ref 0.0–0.5)
Eosinophils Relative: 3 %
HCT: 30 % — ABNORMAL LOW (ref 36.0–46.0)
Hemoglobin: 9.6 g/dL — ABNORMAL LOW (ref 12.0–15.0)
Immature Granulocytes: 1 %
Lymphocytes Relative: 23 %
Lymphs Abs: 2 10*3/uL (ref 0.7–4.0)
MCH: 29.4 pg (ref 26.0–34.0)
MCHC: 32 g/dL (ref 30.0–36.0)
MCV: 92 fL (ref 80.0–100.0)
Monocytes Absolute: 0.6 10*3/uL (ref 0.1–1.0)
Monocytes Relative: 7 %
Neutro Abs: 5.8 10*3/uL (ref 1.7–7.7)
Neutrophils Relative %: 65 %
Platelets: 321 10*3/uL (ref 150–400)
RBC: 3.26 MIL/uL — ABNORMAL LOW (ref 3.87–5.11)
RDW: 12.5 % (ref 11.5–15.5)
WBC: 8.7 10*3/uL (ref 4.0–10.5)
nRBC: 0 % (ref 0.0–0.2)

## 2019-10-21 LAB — MAGNESIUM: Magnesium: 2 mg/dL (ref 1.7–2.4)

## 2019-10-21 MED ORDER — METHOCARBAMOL 500 MG PO TABS
500.0000 mg | ORAL_TABLET | Freq: Every day | ORAL | Status: DC
  Administered 2019-10-21 – 2019-11-09 (×20): 500 mg via ORAL
  Filled 2019-10-21 (×20): qty 1

## 2019-10-21 MED ORDER — METHOCARBAMOL 1000 MG/10ML IJ SOLN
500.0000 mg | Freq: Four times a day (QID) | INTRAVENOUS | Status: DC | PRN
  Filled 2019-10-21: qty 5

## 2019-10-21 NOTE — Progress Notes (Signed)
Dr. Posey Pronto called and gave a verbal order to remove wound vac and apply wet to dry dsg. Patient pre medicated and tolerated wound care.

## 2019-10-21 NOTE — Progress Notes (Signed)
PROGRESS NOTE    Alexis Ball  BHA:[id number] DOB: 1955/06/22 DOA: 10/13/2019 PCP: Lucianne Lei, MD     Brief Narrative:  64 y.o. BF PMHx HTN, remote CVA, and recentbunionectomy with silicone implant of her left foot on June 25.  Patient started to have increasing pain and drainage from thesurgicalwoundaftershe tripped and fell down on the foot last week. She went to podiatrist office 3 days ago, x-ray showed no significant displacement and of the implant and MTP joints, but there was a local infection going on, podiatrist ordered clindamycin plus Keflex regimen which she has been taking. But today, she noticed that the wound is "busted open"with maggotscoming out.     Subjective: 7/11 afebrile overnight A/O x4, negative S OB, negative CP, negative abdominal pain.  Still having some breakthrough pain on her LEFT foot.  Wound VAC is frequently malfunctioning.   Assessment & Plan: Covid vaccination;   Active Problems:   Abscess of bursa of left foot   Foot abscess, left   Wound infection  LEFT forefoot abscess/cellulitis/osteomyelitis -7/4 s/p I&D by Dr. March Rummage -7/7 s/p LEFT foot ORIF see results below -ID recommends 6 weeks of IV cefepime after amputation. -OPATorders and home health vs SNF -ID will follow up with in office. -7/8 DC cefepime---> Entapenem -7/11 spoke with Dr. Hardie Pulley over secure chat concerning malfunction of wound VAC, he stated he would check it out.  Will await recommendations  Essential HTN -7/10 lisinopril 2.5 mg daily  Hypokalemia* -Resolved  Hypomagnesmia -Resolved  History of CVA -No acute issue, continue aspirin  Chronic iron deficiency anemia -Ferrous sulfate 325 mg daily + vitamin C 500 mg daily  -outpatient GI follow-up  Obese Body mass index is 32.06 kg/m.  Pain control -Oxycodone 10 mg BID -Vicodin 5-325 mg 1-2 tab QID PRN breakthrough pain   DVT prophylaxis:  Code Status: Full Family Communication:  7/11 daughter present at bedside for discussion of plan of care Status is: Inpatient    Dispo: The patient is from: Home              Anticipated d/c is to: 7/9 after long discussion with patient understands that going home without any sort of home health care is a nonstarter, and that she needs SNF. Patient has agreed to go to SNF              Anticipated d/c date is: 7/14              Patient currently unstable in surgery      Consultants:  Podiatry Dr. Hardie Pulley ID   Procedures/Significant Events:  7/4 s/p I&D LEFT foot with bone biopsy by Dr. Hardie Pulley [facility] 7/7Left Foot; Removal of Silicone Implant; Insertion of Antibiotic Spacer; Application of External Fixator; Possible Application of Wound VAC (Left)    I have personally reviewed and interpreted all radiology studies and my findings are as above.  VENTILATOR SETTINGS:    Cultures 7/4 LEFT foot positive Enterobacter Cloacae 7/4 LEFT proximal phalanx positive Enterobacter Cloacae, FINEGOLDIA MAGNA 7/4 she is well second interspace positive Enterobacter Cloacae 7/4 MRSA by PCR negative 7/4 staph aureus negative 7/4 SARS coronavirus negative     Antimicrobials: Anti-infectives (From admission, onward)   Start     Ordered Stop   10/18/19 1400  ertapenem (INVANZ) 1,000 mg in sodium chloride 0.9 % 100 mL IVPB     Discontinue     10/18/19 0909     10/17/19 1739  vancomycin (VANCOCIN) powder  Status:  Discontinued        10/17/19 1740 10/17/19 1820   10/16/19 1400  ceFEPIme (MAXIPIME) 2 g in sodium chloride 0.9 % 100 mL IVPB  Status:  Discontinued        10/16/19 1233 10/18/19 0909   10/15/19 2100  vancomycin (VANCOCIN) IVPB 1000 mg/200 mL premix  Status:  Discontinued        10/15/19 1155 10/16/19 1233   10/15/19 0800  vancomycin (VANCOREADY) IVPB 750 mg/150 mL  Status:  Discontinued        10/14/19 1806 10/15/19 1155   10/14/19 1830  piperacillin-tazobactam (ZOSYN) IVPB 3.375 g   Status:  Discontinued        10/14/19 1806 10/16/19 1234   10/14/19 1800  vancomycin (VANCOREADY) IVPB 1500 mg/300 mL        10/14/19 1745 10/14/19 2051   10/14/19 1440  vancomycin (VANCOCIN) powder  Status:  Discontinued        10/14/19 1531 10/14/19 1534   10/14/19 1400  ceFAZolin (ANCEF) IVPB 1 g/50 mL premix  Status:  Discontinued        10/14/19 1135 10/14/19 1741   10/14/19 1000  doxycycline (VIBRA-TABS) tablet 100 mg  Status:  Discontinued        10/14/19 0840 10/14/19 1745   10/14/19 0630  piperacillin-tazobactam (ZOSYN) IVPB 3.375 g        10/14/19 0623 10/14/19 0736       Devices    LINES / TUBES:  Right arm PICC line>>>    Continuous Infusions: . ertapenem 1,000 mg (10/20/19 1402)     Objective: Vitals:   10/20/19 1703 10/20/19 1955 10/21/19 0254 10/21/19 0754  BP: (!) 151/84 (!) 145/88 (!) 134/59 138/75  Pulse: 71 77 79 81  Resp: 17 15 16    Temp: 98.4 F (36.9 C) 98.6 F (37 C) 98.1 F (36.7 C) 98.5 F (36.9 C)  TempSrc: Oral Oral Oral Oral  SpO2: 100% 99% 100% 99%  Weight:      Height:       No intake or output data in the 24 hours ending 10/21/19 1256 Filed Weights   10/13/19 2329 10/17/19 1452  Weight: 82.1 kg 82.1 kg   Physical Exam:  General: A/O x4, No acute respiratory distress Eyes: negative scleral hemorrhage, negative anisocoria, negative icterus ENT: Negative Runny nose, negative gingival bleeding, Neck:  Negative scars, masses, torticollis, lymphadenopathy, JVD Lungs: Clear to auscultation bilaterally without wheezes or crackles Cardiovascular: Regular rate and rhythm without murmur gallop or rub normal S1 and S2 Abdomen: negative abdominal pain, nondistended, positive soft, bowel sounds, no rebound, no ascites, no appreciable mass Extremities: LEFT foot ORIF and Ace wrap with wound VAC in place. Minimal serosanguineous fluid being suctioned by wound VAC.  Wound VAC continues to malfunction Skin: Negative rashes, lesions,  ulcers Psychiatric:  Negative depression, negative anxiety, negative fatigue, negative mania  Central nervous system:  Cranial nerves II through XII intact, tongue/uvula midline, all extremities muscle strength 5/5, sensation intact throughout,  negative dysarthria, negative expressive aphasia, negative receptive aphasia.    Data Reviewed: Care during the described time interval was provided by me .  I have reviewed this patient's available data, including medical history, events of note, physical examination, and all test results as part of my evaluation.  CBC: Recent Labs  Lab 10/16/19 0424 10/18/19 1128 10/19/19 0440 10/20/19 0412 10/21/19 0356  WBC 8.5 10.6* 8.5 8.9 8.7  NEUTROABS  --  7.3 5.3  5.9 5.8  HGB 10.1* 10.1* 9.6* 9.6* 9.6*  HCT 31.9* 31.5* 30.6* 30.0* 30.0*  MCV 93.3 92.1 93.0 92.3 92.0  PLT 376 363 336 336 419   Basic Metabolic Panel: Recent Labs  Lab 10/16/19 0424 10/18/19 1128 10/19/19 0440 10/20/19 0412 10/21/19 0356  NA 138 139 139 138 137  K 3.3* 3.8 3.5 4.4 4.2  CL 104 108 108 107 104  CO2 24 24 25 25 27   GLUCOSE 116* 109* 121* 107* 105*  BUN 11 9 10 9 11   CREATININE 0.93 0.79 0.73 0.74 0.72  CALCIUM 8.6* 8.7* 8.5* 8.8* 8.8*  MG  --  1.7 1.8 1.9 2.0  PHOS  --  2.4* 3.3 3.1 3.8   GFR: Estimated Creatinine Clearance: 74 mL/min (by C-G formula based on SCr of 0.72 mg/dL). Liver Function Tests: Recent Labs  Lab 10/18/19 1128 10/19/19 0440 10/20/19 0412 10/21/19 0356  AST 24 28 27 21   ALT 21 23 23 19   ALKPHOS 59 52 62 58  BILITOT 0.4 0.4 0.4 0.2*  PROT 5.9* 5.5* 5.7* 5.8*  ALBUMIN 2.4* 2.1* 2.3* 2.3*   No results for input(s): LIPASE, AMYLASE in the last 168 hours. No results for input(s): AMMONIA in the last 168 hours. Coagulation Profile: No results for input(s): INR, PROTIME in the last 168 hours. Cardiac Enzymes: No results for input(s): CKTOTAL, CKMB, CKMBINDEX, TROPONINI in the last 168 hours. BNP (last 3 results) No results for  input(s): PROBNP in the last 8760 hours. HbA1C: No results for input(s): HGBA1C in the last 72 hours. CBG: No results for input(s): GLUCAP in the last 168 hours. Lipid Profile: No results for input(s): CHOL, HDL, LDLCALC, TRIG, CHOLHDL, LDLDIRECT in the last 72 hours. Thyroid Function Tests: No results for input(s): TSH, T4TOTAL, FREET4, T3FREE, THYROIDAB in the last 72 hours. Anemia Panel: No results for input(s): VITAMINB12, FOLATE, FERRITIN, TIBC, IRON, RETICCTPCT in the last 72 hours. Sepsis Labs: No results for input(s): PROCALCITON, LATICACIDVEN in the last 168 hours.  Recent Results (from the past 240 hour(s))  SARS Coronavirus 2 by RT PCR (hospital order, performed in [facility] hospital lab) Nasopharyngeal Nasopharyngeal Swab     Status: None   Collection Time: 10/14/19  4:46 AM   Specimen: Nasopharyngeal Swab  Result Value Ref Range Status   SARS Coronavirus 2 NEGATIVE NEGATIVE Final    Comment: (NOTE) SARS-CoV-2 target nucleic acids are NOT DETECTED.  The SARS-CoV-2 RNA is generally detectable in upper and lower respiratory specimens during the acute phase of infection. The lowest concentration of SARS-CoV-2 viral copies this assay can detect is 250 copies / mL. A negative result does not preclude SARS-CoV-2 infection and should not be used as the sole basis for treatment or other patient management decisions.  A negative result may occur with improper specimen collection / handling, submission of specimen other than nasopharyngeal swab, presence of viral mutation(s) within the areas targeted by this assay, and inadequate number of viral copies (<250 copies / mL). A negative result must be combined with clinical observations, patient history, and epidemiological information.  Fact Sheet for Patients:   StrictlyIdeas.no  Fact Sheet for Healthcare Providers: BankingDealers.co.za  This test is not yet approved or  cleared  by the [place] FDA and has been authorized for detection and/or diagnosis of SARS-CoV-2 by FDA under an Emergency Use Authorization (EUA).  This EUA will remain in effect (meaning this test can be used) for the duration of the COVID-19 declaration under Section 564(b)(1) of  the Act, 21 U.S.C. section 360bbb-3(b)(1), unless the authorization is terminated or revoked sooner.  Performed at [facility], [place] [address], [place], [place] [address]   Aerobic/Anaerobic Culture (surgical/deep wound)     Status: None   Collection Time: 10/14/19  2:37 PM   Specimen: Soft Tissue, Other; Body Fluid  Result Value Ref Range Status   Specimen Description TISSUE SWAB  Final   Special Requests 2ND LEFTFOOT  Final   Gram Stain NO WBC SEEN NO ORGANISMS SEEN   Final   Culture   Final    FEW ENTEROBACTER CLOACAE NO ANAEROBES ISOLATED Performed at [facility], [address] [place], [place] [address]    Report Status 10/19/2019 FINAL  Final   Organism ID, Bacteria ENTEROBACTER CLOACAE  Final      Susceptibility   Enterobacter cloacae - MIC*    CEFAZOLIN >=64 RESISTANT Resistant     CEFEPIME <=0.12 SENSITIVE Sensitive     CEFTAZIDIME <=1 SENSITIVE Sensitive     CIPROFLOXACIN <=0.25 SENSITIVE Sensitive     GENTAMICIN <=1 SENSITIVE Sensitive     IMIPENEM 0.5 SENSITIVE Sensitive     TRIMETH/SULFA <=20 SENSITIVE Sensitive     PIP/TAZO <=4 SENSITIVE Sensitive     * FEW ENTEROBACTER CLOACAE  Aerobic/Anaerobic Culture (surgical/deep wound)     Status: None   Collection Time: 10/14/19  2:42 PM   Specimen: Soft Tissue, Other; Body Fluid  Result Value Ref Range Status   Specimen Description BONE  Final   Special Requests PROXIMAL PHALANX LEFT  Final   Gram Stain   Final    RARE WBC PRESENT, PREDOMINANTLY PMN RARE GRAM POSITIVE COCCI RARE GRAM VARIABLE ROD Performed at [facility], [place] [address] [place], [place] [address]    Culture FEW ENTEROBACTER CLOACAE FEW  Meredith Pel   Final   Report Status 10/17/2019 FINAL  Final   Organism ID, Bacteria ENTEROBACTER CLOACAE  Final      Susceptibility   Enterobacter cloacae - MIC*    CEFAZOLIN >=64 RESISTANT Resistant     CEFEPIME <=0.12 SENSITIVE Sensitive     CEFTAZIDIME <=1 SENSITIVE Sensitive     CIPROFLOXACIN <=0.25 SENSITIVE Sensitive     GENTAMICIN <=1 SENSITIVE Sensitive     IMIPENEM 0.5 SENSITIVE Sensitive     TRIMETH/SULFA <=20 SENSITIVE Sensitive     PIP/TAZO <=4 SENSITIVE Sensitive     * FEW ENTEROBACTER CLOACAE  Aerobic/Anaerobic Culture (surgical/deep wound)     Status: None   Collection Time: 10/14/19  2:43 PM   Specimen: Other Source; Tissue  Result Value Ref Range Status   Specimen Description TISSUE SWAB  Final   Special Requests 2ND INTERSPACE  Final   Gram Stain   Final    RARE WBC PRESENT, PREDOMINANTLY PMN NO ORGANISMS SEEN    Culture   Final    FEW ENTEROBACTER CLOACAE NO ANAEROBES ISOLATED Performed at [facility], [address] [place], [place] [address]    Report Status 10/19/2019 FINAL  Final   Organism ID, Bacteria ENTEROBACTER CLOACAE  Final      Susceptibility   Enterobacter cloacae - MIC*    CEFAZOLIN >=64 RESISTANT Resistant     CEFEPIME <=0.12 SENSITIVE Sensitive     CEFTAZIDIME <=1 SENSITIVE Sensitive     CIPROFLOXACIN <=0.25 SENSITIVE Sensitive     GENTAMICIN <=1 SENSITIVE Sensitive     IMIPENEM 0.5 SENSITIVE Sensitive     TRIMETH/SULFA <=20 SENSITIVE Sensitive  PIP/TAZO <=4 SENSITIVE Sensitive     * FEW ENTEROBACTER CLOACAE  Surgical pcr screen     Status: None   Collection Time: 10/16/19  6:42 PM   Specimen: Nasal Mucosa; Nasal Swab  Result Value Ref Range Status   MRSA, PCR NEGATIVE NEGATIVE Final   Staphylococcus aureus NEGATIVE NEGATIVE Final    Comment: (NOTE) The Xpert SA Assay (FDA approved for NASAL specimens in patients 55 years of age and older), is one component of a comprehensive surveillance program. It is not  intended to diagnose infection nor to guide or monitor treatment. Performed at [facility], [place] [address], [place], [place] [address]          Radiology Studies: No results found.      Scheduled Meds: . vitamin C  500 mg Oral Daily  . aspirin EC  81 mg Oral Daily  . brimonidine  1 drop Both Eyes BID  . Chlorhexidine Gluconate Cloth  6 each Topical Daily  . dorzolamide-timolol  1 drop Both Eyes BID  . enoxaparin (LOVENOX) injection  40 mg Subcutaneous Q24H  . ferrous sulfate  325 mg Oral Q breakfast  . gabapentin  300 mg Oral q morning - 10a   And  . gabapentin  600 mg Oral QHS  . latanoprost  1 drop Both Eyes QHS  . lisinopril  2.5 mg Oral Daily  . multivitamin with minerals  1 tablet Oral Daily  . omega-3 acid ethyl esters  1 g Oral Daily  . oxyCODONE  10 mg Oral Q12H  . sodium chloride flush  10-40 mL Intracatheter Q12H   Continuous Infusions: . ertapenem 1,000 mg (10/20/19 1402)     LOS: 7 days    Time spent:40 min    Kaulder Zahner, Geraldo Docker, MD [facility] Pager [phone]  If 7PM-7AM, please contact night-coverage [email or web address] Password [facility] 10/21/2019, 12:56 PM

## 2019-10-21 NOTE — Plan of Care (Signed)
  Problem: Education: Goal: Knowledge of General Education information will improve Description: Including pain rating scale, medication(s)/side effects and non-pharmacologic comfort measures Outcome: Progressing   Problem: Safety: Goal: Ability to remain free from injury will improve Outcome: Progressing   Problem: Skin Integrity: Goal: Risk for impaired skin integrity will decrease Outcome: Progressing   

## 2019-10-21 NOTE — Plan of Care (Signed)
  Problem: Education: Goal: Knowledge of General Education information will improve Description: Including pain rating scale, medication(s)/side effects and non-pharmacologic comfort measures 10/21/2019 0019 by Damita Dunnings, RN Outcome: Progressing 10/21/2019 0019 by Damita Dunnings, RN Outcome: Progressing   Problem: Education: Goal: Knowledge of General Education information will improve Description: Including pain rating scale, medication(s)/side effects and non-pharmacologic comfort measures 10/21/2019 0019 by Damita Dunnings, RN Outcome: Progressing 10/21/2019 0019 by Damita Dunnings, RN Outcome: Progressing   Problem: Education: Goal: Knowledge of General Education information will improve Description: Including pain rating scale, medication(s)/side effects and non-pharmacologic comfort measures 10/21/2019 0019 by Damita Dunnings, RN Outcome: Progressing 10/21/2019 0019 by Damita Dunnings, RN Outcome: Progressing   Problem: Education: Goal: Knowledge of General Education information will improve Description: Including pain rating scale, medication(s)/side effects and non-pharmacologic comfort measures 10/21/2019 0019 by Damita Dunnings, RN Outcome: Progressing 10/21/2019 0019 by Damita Dunnings, RN Outcome: Progressing   Problem: Education: Goal: Knowledge of General Education information will improve Description: Including pain rating scale, medication(s)/side effects and non-pharmacologic comfort measures 10/21/2019 0019 by Damita Dunnings, RN Outcome: Progressing 10/21/2019 0019 by Damita Dunnings, RN Outcome: Progressing   Problem: Education: Goal: Knowledge of General Education information will improve Description: Including pain rating scale, medication(s)/side effects and non-pharmacologic comfort measures 10/21/2019 0019 by Damita Dunnings, RN Outcome:  Progressing 10/21/2019 0019 by Damita Dunnings, RN Outcome: Progressing

## 2019-10-21 NOTE — Plan of Care (Signed)
°  Problem: Education: Goal: Knowledge of General Education information will improve Description: Including pain rating scale, medication(s)/side effects and non-pharmacologic comfort measures 10/21/2019 2318 by Damita Dunnings, RN Outcome: Progressing 10/21/2019 2318 by Damita Dunnings, RN Outcome: Progressing   Problem: Education: Goal: Knowledge of General Education information will improve Description: Including pain rating scale, medication(s)/side effects and non-pharmacologic comfort measures 10/21/2019 2318 by Damita Dunnings, RN Outcome: Progressing 10/21/2019 2318 by Damita Dunnings, RN Outcome: Progressing   Problem: Education: Goal: Knowledge of General Education information will improve Description: Including pain rating scale, medication(s)/side effects and non-pharmacologic comfort measures 10/21/2019 2318 by Damita Dunnings, RN Outcome: Progressing 10/21/2019 2318 by Damita Dunnings, RN Outcome: Progressing   Problem: Education: Goal: Knowledge of General Education information will improve Description: Including pain rating scale, medication(s)/side effects and non-pharmacologic comfort measures 10/21/2019 2318 by Damita Dunnings, RN Outcome: Progressing 10/21/2019 2318 by Damita Dunnings, RN Outcome: Progressing

## 2019-10-22 ENCOUNTER — Telehealth: Payer: Self-pay | Admitting: *Deleted

## 2019-10-22 ENCOUNTER — Encounter (HOSPITAL_COMMUNITY): Payer: Self-pay | Admitting: Internal Medicine

## 2019-10-22 DIAGNOSIS — L02612 Cutaneous abscess of left foot: Secondary | ICD-10-CM | POA: Diagnosis not present

## 2019-10-22 DIAGNOSIS — M71072 Abscess of bursa, left ankle and foot: Secondary | ICD-10-CM | POA: Diagnosis not present

## 2019-10-22 DIAGNOSIS — E876 Hypokalemia: Secondary | ICD-10-CM | POA: Diagnosis not present

## 2019-10-22 LAB — CBC WITH DIFFERENTIAL/PLATELET
Abs Immature Granulocytes: 0.06 10*3/uL (ref 0.00–0.07)
Basophils Absolute: 0 10*3/uL (ref 0.0–0.1)
Basophils Relative: 0 %
Eosinophils Absolute: 0.2 10*3/uL (ref 0.0–0.5)
Eosinophils Relative: 3 %
HCT: 30.3 % — ABNORMAL LOW (ref 36.0–46.0)
Hemoglobin: 9.6 g/dL — ABNORMAL LOW (ref 12.0–15.0)
Immature Granulocytes: 1 %
Lymphocytes Relative: 24 %
Lymphs Abs: 1.9 10*3/uL (ref 0.7–4.0)
MCH: 29 pg (ref 26.0–34.0)
MCHC: 31.7 g/dL (ref 30.0–36.0)
MCV: 91.5 fL (ref 80.0–100.0)
Monocytes Absolute: 0.7 10*3/uL (ref 0.1–1.0)
Monocytes Relative: 8 %
Neutro Abs: 5 10*3/uL (ref 1.7–7.7)
Neutrophils Relative %: 64 %
Platelets: 324 10*3/uL (ref 150–400)
RBC: 3.31 MIL/uL — ABNORMAL LOW (ref 3.87–5.11)
RDW: 12.6 % (ref 11.5–15.5)
WBC: 7.8 10*3/uL (ref 4.0–10.5)
nRBC: 0 % (ref 0.0–0.2)

## 2019-10-22 LAB — COMPREHENSIVE METABOLIC PANEL
ALT: 30 U/L (ref 0–44)
AST: 38 U/L (ref 15–41)
Albumin: 2.4 g/dL — ABNORMAL LOW (ref 3.5–5.0)
Alkaline Phosphatase: 64 U/L (ref 38–126)
Anion gap: 8 (ref 5–15)
BUN: 10 mg/dL (ref 8–23)
CO2: 26 mmol/L (ref 22–32)
Calcium: 9 mg/dL (ref 8.9–10.3)
Chloride: 104 mmol/L (ref 98–111)
Creatinine, Ser: 0.74 mg/dL (ref 0.44–1.00)
GFR calc Af Amer: >60 mL/min (ref >60)
GFR calc non Af Amer: >60 mL/min (ref >60)
Glucose, Bld: 105 mg/dL — ABNORMAL HIGH (ref 70–99)
Potassium: 4 mmol/L (ref 3.5–5.1)
Sodium: 138 mmol/L (ref 135–145)
Total Bilirubin: 0.5 mg/dL (ref 0.3–1.2)
Total Protein: 5.9 g/dL — ABNORMAL LOW (ref 6.5–8.1)

## 2019-10-22 LAB — MAGNESIUM: Magnesium: 2 mg/dL (ref 1.7–2.4)

## 2019-10-22 LAB — PHOSPHORUS: Phosphorus: 4 mg/dL (ref 2.5–4.6)

## 2019-10-22 NOTE — Progress Notes (Signed)
,  Subjective:  Patient ID: Alexis Ball, female    DOB: 1955-04-26,  MRN: [id number]  Patient seen bedside. VAC seal lost over the weekend. Pain controlled Objective:   Vitals:   10/21/19 1939 10/22/19 0331  BP: 129/75 (!) 145/82  Pulse: 74 83  Resp: 18 16  Temp: 98.4 F (36.9 C) 98.6 F (37 C)  SpO2: 95% 96%   General AA&O x3. Normal mood and affect.  Vascular  foot warm and well-perfused  Neurologic Epicritic sensation grossly intact.  Dermatologic Dressing clean dry and intact.  Orthopedic: Positive motor to the digits   Results for orders placed or performed during the hospital encounter of 10/13/19  SARS Coronavirus 2 by RT PCR (hospital order, performed in [facility] hospital lab) Nasopharyngeal Nasopharyngeal Swab     Status: None   Collection Time: 10/14/19  4:46 AM   Specimen: Nasopharyngeal Swab  Result Value Ref Range Status   SARS Coronavirus 2 NEGATIVE NEGATIVE Final    Comment: (NOTE) SARS-CoV-2 target nucleic acids are NOT DETECTED.  The SARS-CoV-2 RNA is generally detectable in upper and lower respiratory specimens during the acute phase of infection. The lowest concentration of SARS-CoV-2 viral copies this assay can detect is 250 copies / mL. A negative result does not preclude SARS-CoV-2 infection and should not be used as the sole basis for treatment or other patient management decisions.  A negative result may occur with improper specimen collection / handling, submission of specimen other than nasopharyngeal swab, presence of viral mutation(s) within the areas targeted by this assay, and inadequate number of viral copies (<250 copies / mL). A negative result must be combined with clinical observations, patient history, and epidemiological information.  Fact Sheet for Patients:   StrictlyIdeas.no  Fact Sheet for Healthcare Providers: BankingDealers.co.za  This test is not yet approved or  cleared  by the [place] FDA and has been authorized for detection and/or diagnosis of SARS-CoV-2 by FDA under an Emergency Use Authorization (EUA).  This EUA will remain in effect (meaning this test can be used) for the duration of the COVID-19 declaration under Section 564(b)(1) of the Act, 21 U.S.C. section 360bbb-3(b)(1), unless the authorization is terminated or revoked sooner.  Performed at [facility], [place] [address], [place], [place] [address]   Aerobic/Anaerobic Culture (surgical/deep wound)     Status: None   Collection Time: 10/14/19  2:37 PM   Specimen: Soft Tissue, Other; Body Fluid  Result Value Ref Range Status   Specimen Description TISSUE SWAB  Final   Special Requests 2ND LEFTFOOT  Final   Gram Stain NO WBC SEEN NO ORGANISMS SEEN   Final   Culture   Final    FEW ENTEROBACTER CLOACAE NO ANAEROBES ISOLATED Performed at [facility], [address], [place], [place] [address]    Report Status 10/19/2019 FINAL  Final   Organism ID, Bacteria ENTEROBACTER CLOACAE  Final      Susceptibility   Enterobacter cloacae - MIC*    CEFAZOLIN >=64 RESISTANT Resistant     CEFEPIME <=0.12 SENSITIVE Sensitive     CEFTAZIDIME <=1 SENSITIVE Sensitive     CIPROFLOXACIN <=0.25 SENSITIVE Sensitive     GENTAMICIN <=1 SENSITIVE Sensitive     IMIPENEM 0.5 SENSITIVE Sensitive     TRIMETH/SULFA <=20 SENSITIVE Sensitive     PIP/TAZO <=4 SENSITIVE Sensitive     * FEW ENTEROBACTER CLOACAE  Aerobic/Anaerobic Culture (surgical/deep wound)     Status: None   Collection  Time: 10/14/19  2:42 PM   Specimen: Soft Tissue, Other; Body Fluid  Result Value Ref Range Status   Specimen Description BONE  Final   Special Requests PROXIMAL PHALANX LEFT  Final   Gram Stain   Final    RARE WBC PRESENT, PREDOMINANTLY PMN RARE GRAM POSITIVE COCCI RARE GRAM VARIABLE ROD Performed at [facility], [place] [address] [place], [place] [address]    Culture FEW ENTEROBACTER CLOACAE FEW  Meredith Pel   Final   Report Status 10/17/2019 FINAL  Final   Organism ID, Bacteria ENTEROBACTER CLOACAE  Final      Susceptibility   Enterobacter cloacae - MIC*    CEFAZOLIN >=64 RESISTANT Resistant     CEFEPIME <=0.12 SENSITIVE Sensitive     CEFTAZIDIME <=1 SENSITIVE Sensitive     CIPROFLOXACIN <=0.25 SENSITIVE Sensitive     GENTAMICIN <=1 SENSITIVE Sensitive     IMIPENEM 0.5 SENSITIVE Sensitive     TRIMETH/SULFA <=20 SENSITIVE Sensitive     PIP/TAZO <=4 SENSITIVE Sensitive     * FEW ENTEROBACTER CLOACAE  Aerobic/Anaerobic Culture (surgical/deep wound)     Status: None   Collection Time: 10/14/19  2:43 PM   Specimen: Other Source; Tissue  Result Value Ref Range Status   Specimen Description TISSUE SWAB  Final   Special Requests 2ND INTERSPACE  Final   Gram Stain   Final    RARE WBC PRESENT, PREDOMINANTLY PMN NO ORGANISMS SEEN    Culture   Final    FEW ENTEROBACTER CLOACAE NO ANAEROBES ISOLATED Performed at [facility], [address], [place], [place] [address]    Report Status 10/19/2019 FINAL  Final   Organism ID, Bacteria ENTEROBACTER CLOACAE  Final      Susceptibility   Enterobacter cloacae - MIC*    CEFAZOLIN >=64 RESISTANT Resistant     CEFEPIME <=0.12 SENSITIVE Sensitive     CEFTAZIDIME <=1 SENSITIVE Sensitive     CIPROFLOXACIN <=0.25 SENSITIVE Sensitive     GENTAMICIN <=1 SENSITIVE Sensitive     IMIPENEM 0.5 SENSITIVE Sensitive     TRIMETH/SULFA <=20 SENSITIVE Sensitive     PIP/TAZO <=4 SENSITIVE Sensitive     * FEW ENTEROBACTER CLOACAE  Surgical pcr screen     Status: None   Collection Time: 10/16/19  6:42 PM   Specimen: Nasal Mucosa; Nasal Swab  Result Value Ref Range Status   MRSA, PCR NEGATIVE NEGATIVE Final   Staphylococcus aureus NEGATIVE NEGATIVE Final    Comment: (NOTE) The Xpert SA Assay (FDA approved for NASAL specimens in patients 71 years of age and older), is one component of a comprehensive surveillance program. It is not  intended to diagnose infection nor to guide or monitor treatment. Performed at [facility], [place] [address], [place], [place] [address]     Assessment & Plan:  Patient was evaluated and treated and all questions answered.  L Foot Osteomyelitis s/p ROH, Application of External Fixator, Insertion of Abx Spacer, Debridement and Irrigation of Wounds -Abx per ID. -PICC line in place -Wound VAC at D/c -Wound care: Continue wVAC. Change MWF. Due for change today. -Heel WBAT in CAM boot  Ok for d/c from podiatry standpoint once home health care / SNF needs met.  Evelina Bucy, DPM  Accessible via secure chat for questions or concerns.

## 2019-10-22 NOTE — Consult Note (Addendum)
[place] Nurse Consult Note: Vac was removed sometime during the weekend related to nurse unable to maintain a seal.  Removed moist gauze and reapplied dressing.  External fixator and 2 full thickness post-op wounds to left foot.  Measurement: Medicated for pain prior to the procedure and tolerated with mod amt discomfort.  Anterior foot with red moist wounds; separated by narrow bridge of skin; visible tendon near great toe.  Applied a barrier ring to attempt to maintain a seal aound the edges, then Mepitel contact layer and 2 pieces black foam and covered with drape and attached to 59mm cont suction.  Small amt pink drainage. Applied Ace wrap. Heel floated to reduce pressure.  Home Vac machine and supplies in the room for use after discharge.  Pt will need home health assistance after discharge for dressing changes 3 times a week. Julien Girt MSN, RN, [place], [place], [place]

## 2019-10-22 NOTE — TOC Progression Note (Addendum)
Transition of Care [facility]) - Progression Note    Patient Details  Name: Alexis Ball MRN: [id number] Date of Birth: 08-13-1955  Transition of Care [facility]) CM/SW Contact  Sharin Mons, RN Phone Number: 10/22/2019, 2:24 PM  Clinical Narrative:    Pt with multi agencies unable to provide [facility] services. Pt now agreeable to SNF placement. Pt requiring LT ABX therapy (from 7/7 with end date of 8/18) and wound vac dressing changes 3 times a week. Patient reports preference for  [facility] NCM discussed insurance authorization process and provided Medicare SNF ratings list. Patient expressed being hopeful for rehab and to feel better soon. No further questions reported at this time. NCM to continue to follow and assist with discharge planning needs. Pt states received COVID vaccine, only 1st dose/ Moderna, 09/29/2019.   10/17/2019 @1445  NCM learned from [facility] not in network, unable to accept. Pt made aware. Pt faxed out to SNF for bed offer .....   Expected Discharge Plan: Skilled Nursing Facility Barriers to Discharge: Insurance Authorization, No SNF bed  Expected Discharge Plan and Services Expected Discharge Plan: [place] Choice: [place]                   DME Arranged:  (IV ABX THERAPY) DME Agency: Other - Comment Roel Cluck) Date DME Agency Contacted: 10/15/19 Time DME Agency Contacted: [phone] Representative spoke with at DME Agency: [place]: PT, RN [place] Agency: [facility] [place] Date [place]: 10/19/19 Time [place]: 23 Representative spoke with at [place]: [place] ([place]) Interventions    Readmission Risk Interventions No flowsheet data found.

## 2019-10-22 NOTE — Plan of Care (Signed)

## 2019-10-22 NOTE — Telephone Encounter (Signed)
Community Message - Fredia Sorrow. Just keeping connected on sweet Ms. Mcfayden. Our [facility] pharmacy team will be supporting her for home IV ABX at [place]. The Case Manager is still working on final arrangement for [facility] and the [facility].

## 2019-10-22 NOTE — NC FL2 (Signed)
[place] LEVEL OF CARE SCREENING TOOL     IDENTIFICATION  Patient Name: Alexis Ball Birthdate: 06/21/1955 Sex: female Admission Date (Current Location): 10/13/2019  [facility] and [place] Number:  Herbalist and Address:  The [place], [place] [address] [place], [place] [address]      Provider Number: [id number]  Attending Physician Name and Address:  Allie Bossier, MD  Relative Name and Phone Number:       Current Level of Care: SNF Recommended Level of Care: [place] Prior Approval Number:    Date Approved/Denied:   PASRR Number: [id number]  Discharge Plan: SNF    Current Diagnoses: Patient Active Problem List   Diagnosis Date Noted  . Abscess of bursa of left foot 10/14/2019  . Foot abscess, left 10/14/2019  . Wound infection   . Unilateral primary osteoarthritis, left knee 04/27/2019  . Chondromalacia patellae, left knee 07/02/2016  . Arthritis of left knee 02/25/2016  . Vaginitis and vulvovaginitis, unspecified 08/08/2013  . Leiomyoma of uterus, unspecified 08/08/2013  . Atypical chest pain 03/13/2013  . Abnormal ECG 03/13/2013  . Obesity, unspecified 03/13/2013  . Hypertension     Orientation RESPIRATION BLADDER Height & Weight     Self, Time, Situation, Place  Normal Continent Weight: 82.1 kg Height:  5\' 3"  (160 cm)  BEHAVIORAL SYMPTOMS/MOOD NEUROLOGICAL BOWEL NUTRITION STATUS      Continent Diet (refer to d/c summary)  AMBULATORY STATUS COMMUNICATION OF NEEDS Skin   Extensive Assist Verbally Surgical wounds, Wound Vac (s/p I&D of left foot wound,Removal of Hardware, Application of Multiplanar External Fixator, Application wound VAC)                       Personal Care Assistance Level of Assistance  Bathing, Feeding, Dressing Bathing Assistance: Maximum assistance Feeding assistance: Independent Dressing Assistance: Maximum assistance     Functional Limitations Info  Sight,  Speech, Hearing Sight Info: Adequate Hearing Info: Adequate Speech Info: Adequate    SPECIAL CARE FACTORS FREQUENCY  PT (By licensed PT), OT (By licensed OT)     PT Frequency: 5x/week, evaulate and treat OT Frequency: 5x/week, evaulate and treat            Contractures Contractures Info: Not present    Additional Factors Info  Code Status, Allergies Code Status Info: Full Code Allergies Info: No Known Allergies           Current Medications (10/22/2019):  This is the current hospital active medication list Current Facility-Administered Medications  Medication Dose Route Frequency Provider Last Rate Last Admin  . ascorbic acid (VITAMIN C) tablet 500 mg  500 mg Oral Daily Allie Bossier, MD   500 mg at 10/22/19 0801  . aspirin EC tablet 81 mg  81 mg Oral Daily Evelina Bucy, DPM   81 mg at 10/22/19 0800  . brimonidine (ALPHAGAN) 0.15 % ophthalmic solution 1 drop  1 drop Both Eyes BID Evelina Bucy, DPM   1 drop at 10/22/19 0806  . Chlorhexidine Gluconate Cloth 2 % PADS 6 each  6 each Topical Daily Comer, Okey Regal, MD   6 each at 10/22/19 1105  . dorzolamide-timolol (COSOPT) 22.3-6.8 MG/ML ophthalmic solution 1 drop  1 drop Both Eyes BID Evelina Bucy, DPM   1 drop at 10/22/19 0806  . enoxaparin (LOVENOX) injection 40 mg  40 mg Subcutaneous Q24H Evelina Bucy, DPM   40 mg at  10/22/19 0805  . ertapenem (INVANZ) 1,000 mg in sodium chloride 0.9 % 100 mL IVPB  1 g Intravenous Q24H Thayer Headings, MD 200 mL/hr at 10/22/19 1352 1,000 mg at 10/22/19 1352  . ferrous sulfate tablet 325 mg  325 mg Oral Q breakfast Evelina Bucy, DPM   325 mg at 10/22/19 0800  . gabapentin (NEURONTIN) capsule 300 mg  300 mg Oral q morning - 10a Evelina Bucy, DPM   300 mg at 10/22/19 0801   And  . gabapentin (NEURONTIN) capsule 600 mg  600 mg Oral QHS Evelina Bucy, DPM   600 mg at 10/21/19 2050  . HYDROcodone-acetaminophen (NORCO/VICODIN) 5-325 MG per tablet 1-2 tablet  1-2 tablet  Oral Q6H PRN Evelina Bucy, DPM   1 tablet at 10/22/19 0867  . ibuprofen (ADVIL) tablet 600-800 mg  600-800 mg Oral Q6H PRN Evelina Bucy, DPM   800 mg at 10/22/19 1121  . latanoprost (XALATAN) 0.005 % ophthalmic solution 1 drop  1 drop Both Eyes QHS Evelina Bucy, DPM   1 drop at 10/21/19 2053  . lidocaine (XYLOCAINE) 5 % ointment 1 application  1 application Topical BID PRN Evelina Bucy, DPM      . lisinopril (ZESTRIL) tablet 2.5 mg  2.5 mg Oral Daily Allie Bossier, MD   2.5 mg at 10/22/19 0801  . methocarbamol (ROBAXIN) tablet 500 mg  500 mg Oral q1600 Allie Bossier, MD   500 mg at 10/21/19 1613  . multivitamin with minerals tablet 1 tablet  1 tablet Oral Daily Evelina Bucy, DPM   1 tablet at 10/22/19 0800  . omega-3 acid ethyl esters (LOVAZA) capsule 1 g  1 g Oral Daily Evelina Bucy, DPM   1 g at 10/22/19 0803  . ondansetron (ZOFRAN) tablet 4 mg  4 mg Oral Q8H PRN Evelina Bucy, DPM   4 mg at 10/15/19 2211  . oxyCODONE (OXYCONTIN) 12 hr tablet 10 mg  10 mg Oral Q12H Allie Bossier, MD   10 mg at 10/22/19 1105  . polyethylene glycol (MIRALAX / GLYCOLAX) packet 17 g  17 g Oral Daily PRN Evelina Bucy, DPM      . sodium chloride flush (NS) 0.9 % injection 10-40 mL  10-40 mL Intracatheter Q12H Thayer Headings, MD   10 mL at 10/22/19 0804  . sodium chloride flush (NS) 0.9 % injection 10-40 mL  10-40 mL Intracatheter PRN Comer, Okey Regal, MD         Discharge Medications: Please see discharge summary for a list of discharge medications.  Relevant Imaging Results:  Relevant Lab Results:   Additional Information SS# [id number], wound VAC  Sharin Mons, RN

## 2019-10-22 NOTE — Progress Notes (Signed)
PROGRESS NOTE    Alexis Ball  UMP:[id number] DOB: 07/30/1955 DOA: 10/13/2019 PCP: Lucianne Lei, MD     Brief Narrative:  64 y.o. BF PMHx HTN, remote CVA, and recentbunionectomy with silicone implant of her left foot on June 25.  Patient started to have increasing pain and drainage from thesurgicalwoundaftershe tripped and fell down on the foot last week. She went to podiatrist office 3 days ago, x-ray showed no significant displacement and of the implant and MTP joints, but there was a local infection going on, podiatrist ordered clindamycin plus Keflex regimen which she has been taking. But today, she noticed that the wound is "busted open"with maggotscoming out.     Subjective: 7/12 afebrile last 24 hours A/O x4, negative SOB, negative CP, negative abdominal pain   Assessment & Plan: Covid vaccination;   Active Problems:   Abscess of bursa of left foot   Foot abscess, left   Wound infection  LEFT forefoot abscess/cellulitis/osteomyelitis -7/4 s/p I&D by Dr. March Rummage -7/7 s/p LEFT foot ORIF see results below -ID recommends 6 weeks of IV cefepime after amputation. -OPATorders and home health vs SNF -ID will follow up with in office. -7/8 DC cefepime---> Entapenem -7/11 spoke with Dr. Hardie Pulley over secure chat concerning malfunction of wound VAC, he stated he would check it out.  Will await recommendations  Essential HTN -7/10 lisinopril 2.5 mg daily  Hypokalemia* -Resolved  Hypomagnesmia -Resolved  History of CVA -No acute issue, continue aspirin  Chronic iron deficiency anemia -Ferrous sulfate 325 mg daily + vitamin C 500 mg daily  -outpatient GI follow-up  Obese Body mass index is 32.06 kg/m.  Pain control -Oxycodone 10 mg BID -Vicodin 5-325 mg 1-2 tab QID PRN breakthrough pain   DVT prophylaxis:  Code Status: Full Family Communication: 7/11 daughter present at bedside for discussion of plan of care Status is:  Inpatient    Dispo: The patient is from: Home              Anticipated d/c is to: 7/9 after long discussion with patient understands that going home without any sort of home health care is a nonstarter, and that she needs SNF. Patient has agreed to go to SNF              Anticipated d/c date is: 7/14              Patient currently unstable in surgery      Consultants:  Podiatry Dr. Hardie Pulley ID   Procedures/Significant Events:  7/4 s/p I&D LEFT foot with bone biopsy by Dr. Hardie Pulley [facility] 7/7 Left Foot; Removal of Silicone Implant; Insertion of Antibiotic Spacer; Application of External Fixator; Possible Application of Wound VAC (Left)    I have personally reviewed and interpreted all radiology studies and my findings are as above.  VENTILATOR SETTINGS:    Cultures 7/4 LEFT foot positive Enterobacter Cloacae 7/4 LEFT proximal phalanx positive Enterobacter Cloacae, FINEGOLDIA MAGNA 7/4 she is well second interspace positive Enterobacter Cloacae 7/4 MRSA by PCR negative 7/4 staph aureus negative 7/4 SARS coronavirus negative     Antimicrobials: Anti-infectives (From admission, onward)   Start     Ordered Stop   10/18/19 1400  ertapenem (INVANZ) 1,000 mg in sodium chloride 0.9 % 100 mL IVPB     Discontinue     10/18/19 0909     10/17/19 1739  vancomycin (VANCOCIN) powder  Status:  Discontinued  10/17/19 1740 10/17/19 1820   10/16/19 1400  ceFEPIme (MAXIPIME) 2 g in sodium chloride 0.9 % 100 mL IVPB  Status:  Discontinued        10/16/19 1233 10/18/19 0909   10/15/19 2100  vancomycin (VANCOCIN) IVPB 1000 mg/200 mL premix  Status:  Discontinued        10/15/19 1155 10/16/19 1233   10/15/19 0800  vancomycin (VANCOREADY) IVPB 750 mg/150 mL  Status:  Discontinued        10/14/19 1806 10/15/19 1155   10/14/19 1830  piperacillin-tazobactam (ZOSYN) IVPB 3.375 g  Status:  Discontinued        10/14/19 1806 10/16/19 1234   10/14/19 1800   vancomycin (VANCOREADY) IVPB 1500 mg/300 mL        10/14/19 1745 10/14/19 2051   10/14/19 1440  vancomycin (VANCOCIN) powder  Status:  Discontinued        10/14/19 1531 10/14/19 1534   10/14/19 1400  ceFAZolin (ANCEF) IVPB 1 g/50 mL premix  Status:  Discontinued        10/14/19 1135 10/14/19 1741   10/14/19 1000  doxycycline (VIBRA-TABS) tablet 100 mg  Status:  Discontinued        10/14/19 0840 10/14/19 1745   10/14/19 0630  piperacillin-tazobactam (ZOSYN) IVPB 3.375 g        10/14/19 0623 10/14/19 0736       Devices    LINES / TUBES:  Right arm PICC line>>>    Continuous Infusions: . ertapenem 1,000 mg (10/21/19 1347)     Objective: Vitals:   10/21/19 0254 10/21/19 0754 10/21/19 1939 10/22/19 0331  BP: (!) 134/59 138/75 129/75 (!) 145/82  Pulse: 79 81 74 83  Resp: 16  18 16   Temp: 98.1 F (36.7 C) 98.5 F (36.9 C) 98.4 F (36.9 C) 98.6 F (37 C)  TempSrc: Oral Oral Oral Oral  SpO2: 100% 99% 95% 96%  Weight:      Height:        Intake/Output Summary (Last 24 hours) at 10/22/2019 0813 Last data filed at 10/21/2019 1435 Gross per 24 hour  Intake 440 ml  Output --  Net 440 ml   Filed Weights   10/13/19 2329 10/17/19 1452  Weight: 82.1 kg 82.1 kg   Physical Exam:  General: A/O x4, No acute respiratory distress Eyes: negative scleral hemorrhage, negative anisocoria, negative icterus ENT: Negative Runny nose, negative gingival bleeding, Neck:  Negative scars, masses, torticollis, lymphadenopathy, JVD Lungs: Clear to auscultation bilaterally without wheezes or crackles Cardiovascular: Regular rate and rhythm without murmur gallop or rub normal S1 and S2 Abdomen: negative abdominal pain, nondistended, positive soft, bowel sounds, no rebound, no ascites, no appreciable mass Extremities: LEFT foot ORIF and Ace wrap with wound VAC in place. Minimal serosanguineous fluid being suctioned by wound VAC.  Wound VAC continues to malfunction Skin: Negative rashes,  lesions, ulcers Psychiatric:  Negative depression, negative anxiety, negative fatigue, negative mania  Central nervous system:  Cranial nerves II through XII intact, tongue/uvula midline, all extremities muscle strength 5/5, sensation intact throughout,  negative dysarthria, negative expressive aphasia, negative receptive aphasia.    Data Reviewed: Care during the described time interval was provided by me .  I have reviewed this patient's available data, including medical history, events of note, physical examination, and all test results as part of my evaluation.  CBC: Recent Labs  Lab 10/18/19 1128 10/19/19 0440 10/20/19 0412 10/21/19 0356 10/22/19 0325  WBC 10.6* 8.5 8.9 8.7 7.8  NEUTROABS  7.3 5.3 5.9 5.8 5.0  HGB 10.1* 9.6* 9.6* 9.6* 9.6*  HCT 31.5* 30.6* 30.0* 30.0* 30.3*  MCV 92.1 93.0 92.3 92.0 91.5  PLT 363 336 336 321 767   Basic Metabolic Panel: Recent Labs  Lab 10/18/19 1128 10/19/19 0440 10/20/19 0412 10/21/19 0356 10/22/19 0325  NA 139 139 138 137 138  K 3.8 3.5 4.4 4.2 4.0  CL 108 108 107 104 104  CO2 24 25 25 27 26   GLUCOSE 109* 121* 107* 105* 105*  BUN 9 10 9 11 10   CREATININE 0.79 0.73 0.74 0.72 0.74  CALCIUM 8.7* 8.5* 8.8* 8.8* 9.0  MG 1.7 1.8 1.9 2.0 2.0  PHOS 2.4* 3.3 3.1 3.8 4.0   GFR: Estimated Creatinine Clearance: 74 mL/min (by C-G formula based on SCr of 0.74 mg/dL). Liver Function Tests: Recent Labs  Lab 10/18/19 1128 10/19/19 0440 10/20/19 0412 10/21/19 0356 10/22/19 0325  AST 24 28 27 21  38  ALT 21 23 23 19 30   ALKPHOS 59 52 62 58 64  BILITOT 0.4 0.4 0.4 0.2* 0.5  PROT 5.9* 5.5* 5.7* 5.8* 5.9*  ALBUMIN 2.4* 2.1* 2.3* 2.3* 2.4*   No results for input(s): LIPASE, AMYLASE in the last 168 hours. No results for input(s): AMMONIA in the last 168 hours. Coagulation Profile: No results for input(s): INR, PROTIME in the last 168 hours. Cardiac Enzymes: No results for input(s): CKTOTAL, CKMB, CKMBINDEX, TROPONINI in the last 168  hours. BNP (last 3 results) No results for input(s): PROBNP in the last 8760 hours. HbA1C: No results for input(s): HGBA1C in the last 72 hours. CBG: No results for input(s): GLUCAP in the last 168 hours. Lipid Profile: No results for input(s): CHOL, HDL, LDLCALC, TRIG, CHOLHDL, LDLDIRECT in the last 72 hours. Thyroid Function Tests: No results for input(s): TSH, T4TOTAL, FREET4, T3FREE, THYROIDAB in the last 72 hours. Anemia Panel: No results for input(s): VITAMINB12, FOLATE, FERRITIN, TIBC, IRON, RETICCTPCT in the last 72 hours. Sepsis Labs: No results for input(s): PROCALCITON, LATICACIDVEN in the last 168 hours.  Recent Results (from the past 240 hour(s))  SARS Coronavirus 2 by RT PCR (hospital order, performed in [facility] hospital lab) Nasopharyngeal Nasopharyngeal Swab     Status: None   Collection Time: 10/14/19  4:46 AM   Specimen: Nasopharyngeal Swab  Result Value Ref Range Status   SARS Coronavirus 2 NEGATIVE NEGATIVE Final    Comment: (NOTE) SARS-CoV-2 target nucleic acids are NOT DETECTED.  The SARS-CoV-2 RNA is generally detectable in upper and lower respiratory specimens during the acute phase of infection. The lowest concentration of SARS-CoV-2 viral copies this assay can detect is 250 copies / mL. A negative result does not preclude SARS-CoV-2 infection and should not be used as the sole basis for treatment or other patient management decisions.  A negative result may occur with improper specimen collection / handling, submission of specimen other than nasopharyngeal swab, presence of viral mutation(s) within the areas targeted by this assay, and inadequate number of viral copies (<250 copies / mL). A negative result must be combined with clinical observations, patient history, and epidemiological information.  Fact Sheet for Patients:   StrictlyIdeas.no  Fact Sheet for Healthcare  Providers: BankingDealers.co.za  This test is not yet approved or  cleared by the [place] FDA and has been authorized for detection and/or diagnosis of SARS-CoV-2 by FDA under an Emergency Use Authorization (EUA).  This EUA will remain in effect (meaning this test can be used) for the duration of  the COVID-19 declaration under Section 564(b)(1) of the Act, 21 U.S.C. section 360bbb-3(b)(1), unless the authorization is terminated or revoked sooner.  Performed at [facility], [place] [address], [place], [place] [address]   Aerobic/Anaerobic Culture (surgical/deep wound)     Status: None   Collection Time: 10/14/19  2:37 PM   Specimen: Soft Tissue, Other; Body Fluid  Result Value Ref Range Status   Specimen Description TISSUE SWAB  Final   Special Requests 2ND LEFTFOOT  Final   Gram Stain NO WBC SEEN NO ORGANISMS SEEN   Final   Culture   Final    FEW ENTEROBACTER CLOACAE NO ANAEROBES ISOLATED Performed at [facility], [address] [place], [place] [address]    Report Status 10/19/2019 FINAL  Final   Organism ID, Bacteria ENTEROBACTER CLOACAE  Final      Susceptibility   Enterobacter cloacae - MIC*    CEFAZOLIN >=64 RESISTANT Resistant     CEFEPIME <=0.12 SENSITIVE Sensitive     CEFTAZIDIME <=1 SENSITIVE Sensitive     CIPROFLOXACIN <=0.25 SENSITIVE Sensitive     GENTAMICIN <=1 SENSITIVE Sensitive     IMIPENEM 0.5 SENSITIVE Sensitive     TRIMETH/SULFA <=20 SENSITIVE Sensitive     PIP/TAZO <=4 SENSITIVE Sensitive     * FEW ENTEROBACTER CLOACAE  Aerobic/Anaerobic Culture (surgical/deep wound)     Status: None   Collection Time: 10/14/19  2:42 PM   Specimen: Soft Tissue, Other; Body Fluid  Result Value Ref Range Status   Specimen Description BONE  Final   Special Requests PROXIMAL PHALANX LEFT  Final   Gram Stain   Final    RARE WBC PRESENT, PREDOMINANTLY PMN RARE GRAM POSITIVE COCCI RARE GRAM VARIABLE ROD Performed at [facility], [place] [address] [place], [place] [address]    Culture FEW ENTEROBACTER CLOACAE FEW Meredith Pel   Final   Report Status 10/17/2019 FINAL  Final   Organism ID, Bacteria ENTEROBACTER CLOACAE  Final      Susceptibility   Enterobacter cloacae - MIC*    CEFAZOLIN >=64 RESISTANT Resistant     CEFEPIME <=0.12 SENSITIVE Sensitive     CEFTAZIDIME <=1 SENSITIVE Sensitive     CIPROFLOXACIN <=0.25 SENSITIVE Sensitive     GENTAMICIN <=1 SENSITIVE Sensitive     IMIPENEM 0.5 SENSITIVE Sensitive     TRIMETH/SULFA <=20 SENSITIVE Sensitive     PIP/TAZO <=4 SENSITIVE Sensitive     * FEW ENTEROBACTER CLOACAE  Aerobic/Anaerobic Culture (surgical/deep wound)     Status: None   Collection Time: 10/14/19  2:43 PM   Specimen: Other Source; Tissue  Result Value Ref Range Status   Specimen Description TISSUE SWAB  Final   Special Requests 2ND INTERSPACE  Final   Gram Stain   Final    RARE WBC PRESENT, PREDOMINANTLY PMN NO ORGANISMS SEEN    Culture   Final    FEW ENTEROBACTER CLOACAE NO ANAEROBES ISOLATED Performed at [facility], [address], [place], [place] [address]    Report Status 10/19/2019 FINAL  Final   Organism ID, Bacteria ENTEROBACTER CLOACAE  Final      Susceptibility   Enterobacter cloacae - MIC*    CEFAZOLIN >=64 RESISTANT Resistant     CEFEPIME <=0.12 SENSITIVE Sensitive     CEFTAZIDIME <=1 SENSITIVE Sensitive     CIPROFLOXACIN <=0.25 SENSITIVE Sensitive     GENTAMICIN <=1 SENSITIVE Sensitive     IMIPENEM 0.5 SENSITIVE Sensitive  TRIMETH/SULFA <=20 SENSITIVE Sensitive     PIP/TAZO <=4 SENSITIVE Sensitive     * FEW ENTEROBACTER CLOACAE  Surgical pcr screen     Status: None   Collection Time: 10/16/19  6:42 PM   Specimen: Nasal Mucosa; Nasal Swab  Result Value Ref Range Status   MRSA, PCR NEGATIVE NEGATIVE Final   Staphylococcus aureus NEGATIVE NEGATIVE Final    Comment: (NOTE) The Xpert SA Assay (FDA approved for NASAL specimens in patients 72 years  of age and older), is one component of a comprehensive surveillance program. It is not intended to diagnose infection nor to guide or monitor treatment. Performed at [facility], [place] [address] [place], [place] [address]          Radiology Studies: No results found.      Scheduled Meds: . vitamin C  500 mg Oral Daily  . aspirin EC  81 mg Oral Daily  . brimonidine  1 drop Both Eyes BID  . Chlorhexidine Gluconate Cloth  6 each Topical Daily  . dorzolamide-timolol  1 drop Both Eyes BID  . enoxaparin (LOVENOX) injection  40 mg Subcutaneous Q24H  . ferrous sulfate  325 mg Oral Q breakfast  . gabapentin  300 mg Oral q morning - 10a   And  . gabapentin  600 mg Oral QHS  . latanoprost  1 drop Both Eyes QHS  . lisinopril  2.5 mg Oral Daily  . methocarbamol  500 mg Oral q1600  . multivitamin with minerals  1 tablet Oral Daily  . omega-3 acid ethyl esters  1 g Oral Daily  . oxyCODONE  10 mg Oral Q12H  . sodium chloride flush  10-40 mL Intracatheter Q12H   Continuous Infusions: . ertapenem 1,000 mg (10/21/19 1347)     LOS: 8 days    Time spent:40 min    Delyle Weider, Geraldo Docker, MD [facility] Pager [phone]  If 7PM-7AM, please contact night-coverage [email or web address] Password [facility] 10/22/2019, 8:13 AM

## 2019-10-23 LAB — CBC WITH DIFFERENTIAL/PLATELET
Abs Immature Granulocytes: 0.06 10*3/uL (ref 0.00–0.07)
Basophils Absolute: 0.1 10*3/uL (ref 0.0–0.1)
Basophils Relative: 1 %
Eosinophils Absolute: 0.2 10*3/uL (ref 0.0–0.5)
Eosinophils Relative: 3 %
HCT: 30.5 % — ABNORMAL LOW (ref 36.0–46.0)
Hemoglobin: 9.6 g/dL — ABNORMAL LOW (ref 12.0–15.0)
Immature Granulocytes: 1 %
Lymphocytes Relative: 20 %
Lymphs Abs: 1.7 10*3/uL (ref 0.7–4.0)
MCH: 29.2 pg (ref 26.0–34.0)
MCHC: 31.5 g/dL (ref 30.0–36.0)
MCV: 92.7 fL (ref 80.0–100.0)
Monocytes Absolute: 0.8 10*3/uL (ref 0.1–1.0)
Monocytes Relative: 9 %
Neutro Abs: 5.8 10*3/uL (ref 1.7–7.7)
Neutrophils Relative %: 66 %
Platelets: 298 10*3/uL (ref 150–400)
RBC: 3.29 MIL/uL — ABNORMAL LOW (ref 3.87–5.11)
RDW: 12.5 % (ref 11.5–15.5)
WBC: 8.6 10*3/uL (ref 4.0–10.5)
nRBC: 0 % (ref 0.0–0.2)

## 2019-10-23 LAB — COMPREHENSIVE METABOLIC PANEL
ALT: 46 U/L — ABNORMAL HIGH (ref 0–44)
AST: 48 U/L — ABNORMAL HIGH (ref 15–41)
Albumin: 2.4 g/dL — ABNORMAL LOW (ref 3.5–5.0)
Alkaline Phosphatase: 65 U/L (ref 38–126)
Anion gap: 9 (ref 5–15)
BUN: 10 mg/dL (ref 8–23)
CO2: 23 mmol/L (ref 22–32)
Calcium: 9 mg/dL (ref 8.9–10.3)
Chloride: 105 mmol/L (ref 98–111)
Creatinine, Ser: 0.73 mg/dL (ref 0.44–1.00)
GFR calc Af Amer: >60 mL/min (ref >60)
GFR calc non Af Amer: >60 mL/min (ref >60)
Glucose, Bld: 99 mg/dL (ref 70–99)
Potassium: 3.9 mmol/L (ref 3.5–5.1)
Sodium: 137 mmol/L (ref 135–145)
Total Bilirubin: 0.4 mg/dL (ref 0.3–1.2)
Total Protein: 5.8 g/dL — ABNORMAL LOW (ref 6.5–8.1)

## 2019-10-23 LAB — MAGNESIUM: Magnesium: 1.8 mg/dL (ref 1.7–2.4)

## 2019-10-23 LAB — PHOSPHORUS: Phosphorus: 3.8 mg/dL (ref 2.5–4.6)

## 2019-10-23 NOTE — Progress Notes (Signed)
PROGRESS NOTE    Alexis Ball  YIR:[id number] DOB: [birth year] DOA: 10/13/2019 PCP: Lucianne Lei, MD     Brief Narrative:  64 y.o. BF PMHx HTN, remote CVA, and recentbunionectomy with silicone implant of her left foot on June 25.  Patient started to have increasing pain and drainage from thesurgicalwoundaftershe tripped and fell down on the foot last week. She went to podiatrist office 3 days ago, x-ray showed no significant displacement and of the implant and MTP joints, but there was a local infection going on, podiatrist ordered clindamycin plus Keflex regimen which she has been taking. But today, she noticed that the wound is "busted open"with maggotscoming out.     Subjective: 7/13 afebrile overnight, A/O x4, negative S OB, negative CP, negative abdominal pain.    Assessment & Plan: Covid vaccination;   Active Problems:   Abscess of bursa of left foot   Foot abscess, left   Wound infection  LEFT forefoot abscess/cellulitis/osteomyelitis -7/4 s/p I&D by Dr. March Rummage -7/7 s/p LEFT foot ORIF see results below -ID recommends 6 weeks of IV cefepime after amputation. -OPATorders and home health vs SNF -ID will follow up with in office. -7/8 DC cefepime---> Entapenem -7/11 spoke with Dr. Hardie Pulley over secure chat concerning malfunction of wound VAC, he stated he would check it out.  Addendum; changed out wound VAC  Essential HTN -7/10 lisinopril 2.5 mg daily  Hypokalemia* -Resolved  Hypomagnesmia -Resolved  History of CVA -No acute issue, continue aspirin  Chronic iron deficiency anemia -Ferrous sulfate 325 mg daily + vitamin C 500 mg daily  -outpatient GI follow-up  Obese Body mass index is 32.06 kg/m.  Pain control -Oxycodone 10 mg BID -Vicodin 5-325 mg 1-2 tab QID PRN breakthrough pain   DVT prophylaxis:  Code Status: Full Family Communication: 7/11 daughter present at bedside for discussion of plan of care Status is:  Inpatient    Dispo: The patient is from: Home              Anticipated d/c is to: 7/9 after long discussion with patient understands that going home without any sort of home health care is a nonstarter, and that she needs SNF. Patient has agreed to go to SNF              Anticipated d/c date is: 7/14              Patient currently unstable in surgery      Consultants:  Podiatry Dr. Hardie Pulley ID   Procedures/Significant Events:  7/4 s/p I&D LEFT foot with bone biopsy by Dr. Hardie Pulley [facility] 7/7 Left Foot; Removal of Silicone Implant; Insertion of Antibiotic Spacer; Application of External Fixator; Possible Application of Wound VAC (Left)    I have personally reviewed and interpreted all radiology studies and my findings are as above.  VENTILATOR SETTINGS:    Cultures 7/4 LEFT foot positive Enterobacter Cloacae 7/4 LEFT proximal phalanx positive Enterobacter Cloacae, FINEGOLDIA MAGNA 7/4 she is well second interspace positive Enterobacter Cloacae 7/4 MRSA by PCR negative 7/4 staph aureus negative 7/4 SARS coronavirus negative     Antimicrobials: Anti-infectives (From admission, onward)   Start     Ordered Stop   10/18/19 1400  ertapenem (INVANZ) 1,000 mg in sodium chloride 0.9 % 100 mL IVPB     Discontinue     10/18/19 0909     10/17/19 1739  vancomycin (VANCOCIN) powder  Status:  Discontinued  10/17/19 1740 10/17/19 1820   10/16/19 1400  ceFEPIme (MAXIPIME) 2 g in sodium chloride 0.9 % 100 mL IVPB  Status:  Discontinued        10/16/19 1233 10/18/19 0909   10/15/19 2100  vancomycin (VANCOCIN) IVPB 1000 mg/200 mL premix  Status:  Discontinued        10/15/19 1155 10/16/19 1233   10/15/19 0800  vancomycin (VANCOREADY) IVPB 750 mg/150 mL  Status:  Discontinued        10/14/19 1806 10/15/19 1155   10/14/19 1830  piperacillin-tazobactam (ZOSYN) IVPB 3.375 g  Status:  Discontinued        10/14/19 1806 10/16/19 1234   10/14/19 1800   vancomycin (VANCOREADY) IVPB 1500 mg/300 mL        10/14/19 1745 10/14/19 2051   10/14/19 1440  vancomycin (VANCOCIN) powder  Status:  Discontinued        10/14/19 1531 10/14/19 1534   10/14/19 1400  ceFAZolin (ANCEF) IVPB 1 g/50 mL premix  Status:  Discontinued        10/14/19 1135 10/14/19 1741   10/14/19 1000  doxycycline (VIBRA-TABS) tablet 100 mg  Status:  Discontinued        10/14/19 0840 10/14/19 1745   10/14/19 0630  piperacillin-tazobactam (ZOSYN) IVPB 3.375 g        10/14/19 0623 10/14/19 0736       Devices    LINES / TUBES:  Right arm PICC line>>>    Continuous Infusions: . ertapenem Stopped (10/23/19 1511)     Objective: Vitals:   10/22/19 1957 10/23/19 0300 10/23/19 0753 10/23/19 1452  BP: 115/78 130/71 (!) 141/76 133/78  Pulse: 80 82 76 87  Resp: 16 18 16 17   Temp: 98.5 F (36.9 C) 98.7 F (37.1 C) 98.4 F (36.9 C) 98.3 F (36.8 C)  TempSrc: Oral Oral Oral Oral  SpO2: 97% 97% 95% 98%  Weight:      Height:        Intake/Output Summary (Last 24 hours) at 10/23/2019 1818 Last data filed at 10/23/2019 1603 Gross per 24 hour  Intake 1018.6 ml  Output 0 ml  Net 1018.6 ml   Filed Weights   10/13/19 2329 10/17/19 1452  Weight: 82.1 kg 82.1 kg   Physical Exam:  General: A/O x4, No acute respiratory distress Eyes: negative scleral hemorrhage, negative anisocoria, negative icterus ENT: Negative Runny nose, negative gingival bleeding, Neck:  Negative scars, masses, torticollis, lymphadenopathy, JVD Lungs: Clear to auscultation bilaterally without wheezes or crackles Cardiovascular: Regular rate and rhythm without murmur gallop or rub normal S1 and S2 Abdomen: negative abdominal pain, nondistended, positive soft, bowel sounds, no rebound, no ascites, no appreciable mass Extremities: LEFT foot ORIF and Ace wrap with wound VAC in place. Minimal serosanguineous fluid being suctioned by wound VAC.  Wound VAC continues to malfunction Skin: Negative rashes,  lesions, ulcers Psychiatric:  Negative depression, negative anxiety, negative fatigue, negative mania  Central nervous system:  Cranial nerves II through XII intact, tongue/uvula midline, all extremities muscle strength 5/5, sensation intact throughout,  negative dysarthria, negative expressive aphasia, negative receptive aphasia.    Data Reviewed: Care during the described time interval was provided by me .  I have reviewed this patient's available data, including medical history, events of note, physical examination, and all test results as part of my evaluation.  CBC: Recent Labs  Lab 10/19/19 0440 10/20/19 0412 10/21/19 0356 10/22/19 0325 10/23/19 0426  WBC 8.5 8.9 8.7 7.8 8.6  NEUTROABS 5.3  5.9 5.8 5.0 5.8  HGB 9.6* 9.6* 9.6* 9.6* 9.6*  HCT 30.6* 30.0* 30.0* 30.3* 30.5*  MCV 93.0 92.3 92.0 91.5 92.7  PLT 336 336 321 324 700   Basic Metabolic Panel: Recent Labs  Lab 10/19/19 0440 10/20/19 0412 10/21/19 0356 10/22/19 0325 10/23/19 0426  NA 139 138 137 138 137  K 3.5 4.4 4.2 4.0 3.9  CL 108 107 104 104 105  CO2 25 25 27 26 23   GLUCOSE 121* 107* 105* 105* 99  BUN 10 9 11 10 10   CREATININE 0.73 0.74 0.72 0.74 0.73  CALCIUM 8.5* 8.8* 8.8* 9.0 9.0  MG 1.8 1.9 2.0 2.0 1.8  PHOS 3.3 3.1 3.8 4.0 3.8   GFR: Estimated Creatinine Clearance: 74 mL/min (by C-G formula based on SCr of 0.73 mg/dL). Liver Function Tests: Recent Labs  Lab 10/19/19 0440 10/20/19 0412 10/21/19 0356 10/22/19 0325 10/23/19 0426  AST 28 27 21  38 48*  ALT 23 23 19 30  46*  ALKPHOS 52 62 58 64 65  BILITOT 0.4 0.4 0.2* 0.5 0.4  PROT 5.5* 5.7* 5.8* 5.9* 5.8*  ALBUMIN 2.1* 2.3* 2.3* 2.4* 2.4*   No results for input(s): LIPASE, AMYLASE in the last 168 hours. No results for input(s): AMMONIA in the last 168 hours. Coagulation Profile: No results for input(s): INR, PROTIME in the last 168 hours. Cardiac Enzymes: No results for input(s): CKTOTAL, CKMB, CKMBINDEX, TROPONINI in the last 168  hours. BNP (last 3 results) No results for input(s): PROBNP in the last 8760 hours. HbA1C: No results for input(s): HGBA1C in the last 72 hours. CBG: No results for input(s): GLUCAP in the last 168 hours. Lipid Profile: No results for input(s): CHOL, HDL, LDLCALC, TRIG, CHOLHDL, LDLDIRECT in the last 72 hours. Thyroid Function Tests: No results for input(s): TSH, T4TOTAL, FREET4, T3FREE, THYROIDAB in the last 72 hours. Anemia Panel: No results for input(s): VITAMINB12, FOLATE, FERRITIN, TIBC, IRON, RETICCTPCT in the last 72 hours. Sepsis Labs: No results for input(s): PROCALCITON, LATICACIDVEN in the last 168 hours.  Recent Results (from the past 240 hour(s))  SARS Coronavirus 2 by RT PCR (hospital order, performed in [facility] hospital lab) Nasopharyngeal Nasopharyngeal Swab     Status: None   Collection Time: 10/14/19  4:46 AM   Specimen: Nasopharyngeal Swab  Result Value Ref Range Status   SARS Coronavirus 2 NEGATIVE NEGATIVE Final    Comment: (NOTE) SARS-CoV-2 target nucleic acids are NOT DETECTED.  The SARS-CoV-2 RNA is generally detectable in upper and lower respiratory specimens during the acute phase of infection. The lowest concentration of SARS-CoV-2 viral copies this assay can detect is 250 copies / mL. A negative result does not preclude SARS-CoV-2 infection and should not be used as the sole basis for treatment or other patient management decisions.  A negative result may occur with improper specimen collection / handling, submission of specimen other than nasopharyngeal swab, presence of viral mutation(s) within the areas targeted by this assay, and inadequate number of viral copies (<250 copies / mL). A negative result must be combined with clinical observations, patient history, and epidemiological information.  Fact Sheet for Patients:   StrictlyIdeas.no  Fact Sheet for Healthcare  Providers: BankingDealers.co.za  This test is not yet approved or  cleared by the [place] FDA and has been authorized for detection and/or diagnosis of SARS-CoV-2 by FDA under an Emergency Use Authorization (EUA).  This EUA will remain in effect (meaning this test can be used) for the duration of the  COVID-19 declaration under Section 564(b)(1) of the Act, 21 U.S.C. section 360bbb-3(b)(1), unless the authorization is terminated or revoked sooner.  Performed at [facility], [place] [address] [place], [place] [address]   Aerobic/Anaerobic Culture (surgical/deep wound)     Status: None   Collection Time: 10/14/19  2:37 PM   Specimen: Soft Tissue, Other; Body Fluid  Result Value Ref Range Status   Specimen Description TISSUE SWAB  Final   Special Requests 2ND LEFTFOOT  Final   Gram Stain NO WBC SEEN NO ORGANISMS SEEN   Final   Culture   Final    FEW ENTEROBACTER CLOACAE NO ANAEROBES ISOLATED Performed at [facility], [address], [place], [place] [address]    Report Status 10/19/2019 FINAL  Final   Organism ID, Bacteria ENTEROBACTER CLOACAE  Final      Susceptibility   Enterobacter cloacae - MIC*    CEFAZOLIN >=64 RESISTANT Resistant     CEFEPIME <=0.12 SENSITIVE Sensitive     CEFTAZIDIME <=1 SENSITIVE Sensitive     CIPROFLOXACIN <=0.25 SENSITIVE Sensitive     GENTAMICIN <=1 SENSITIVE Sensitive     IMIPENEM 0.5 SENSITIVE Sensitive     TRIMETH/SULFA <=20 SENSITIVE Sensitive     PIP/TAZO <=4 SENSITIVE Sensitive     * FEW ENTEROBACTER CLOACAE  Aerobic/Anaerobic Culture (surgical/deep wound)     Status: None   Collection Time: 10/14/19  2:42 PM   Specimen: Soft Tissue, Other; Body Fluid  Result Value Ref Range Status   Specimen Description BONE  Final   Special Requests PROXIMAL PHALANX LEFT  Final   Gram Stain   Final    RARE WBC PRESENT, PREDOMINANTLY PMN RARE GRAM POSITIVE COCCI RARE GRAM VARIABLE ROD Performed at [facility], [place] [address], [place], [place] [address]    Culture FEW ENTEROBACTER CLOACAE FEW Meredith Pel   Final   Report Status 10/17/2019 FINAL  Final   Organism ID, Bacteria ENTEROBACTER CLOACAE  Final      Susceptibility   Enterobacter cloacae - MIC*    CEFAZOLIN >=64 RESISTANT Resistant     CEFEPIME <=0.12 SENSITIVE Sensitive     CEFTAZIDIME <=1 SENSITIVE Sensitive     CIPROFLOXACIN <=0.25 SENSITIVE Sensitive     GENTAMICIN <=1 SENSITIVE Sensitive     IMIPENEM 0.5 SENSITIVE Sensitive     TRIMETH/SULFA <=20 SENSITIVE Sensitive     PIP/TAZO <=4 SENSITIVE Sensitive     * FEW ENTEROBACTER CLOACAE  Aerobic/Anaerobic Culture (surgical/deep wound)     Status: None   Collection Time: 10/14/19  2:43 PM   Specimen: Other Source; Tissue  Result Value Ref Range Status   Specimen Description TISSUE SWAB  Final   Special Requests 2ND INTERSPACE  Final   Gram Stain   Final    RARE WBC PRESENT, PREDOMINANTLY PMN NO ORGANISMS SEEN    Culture   Final    FEW ENTEROBACTER CLOACAE NO ANAEROBES ISOLATED Performed at [facility], [address], [place], [place] [address]    Report Status 10/19/2019 FINAL  Final   Organism ID, Bacteria ENTEROBACTER CLOACAE  Final      Susceptibility   Enterobacter cloacae - MIC*    CEFAZOLIN >=64 RESISTANT Resistant     CEFEPIME <=0.12 SENSITIVE Sensitive     CEFTAZIDIME <=1 SENSITIVE Sensitive     CIPROFLOXACIN <=0.25 SENSITIVE Sensitive     GENTAMICIN <=1 SENSITIVE Sensitive     IMIPENEM 0.5 SENSITIVE Sensitive  TRIMETH/SULFA <=20 SENSITIVE Sensitive     PIP/TAZO <=4 SENSITIVE Sensitive     * FEW ENTEROBACTER CLOACAE  Surgical pcr screen     Status: None   Collection Time: 10/16/19  6:42 PM   Specimen: Nasal Mucosa; Nasal Swab  Result Value Ref Range Status   MRSA, PCR NEGATIVE NEGATIVE Final   Staphylococcus aureus NEGATIVE NEGATIVE Final    Comment: (NOTE) The Xpert SA Assay (FDA approved for NASAL specimens in patients 46 years  of age and older), is one component of a comprehensive surveillance program. It is not intended to diagnose infection nor to guide or monitor treatment. Performed at [facility], [place] [address], [place], [place] [address]          Radiology Studies: No results found.      Scheduled Meds: . vitamin C  500 mg Oral Daily  . aspirin EC  81 mg Oral Daily  . brimonidine  1 drop Both Eyes BID  . Chlorhexidine Gluconate Cloth  6 each Topical Daily  . dorzolamide-timolol  1 drop Both Eyes BID  . enoxaparin (LOVENOX) injection  40 mg Subcutaneous Q24H  . ferrous sulfate  325 mg Oral Q breakfast  . gabapentin  300 mg Oral q morning - 10a   And  . gabapentin  600 mg Oral QHS  . latanoprost  1 drop Both Eyes QHS  . lisinopril  2.5 mg Oral Daily  . methocarbamol  500 mg Oral q1600  . multivitamin with minerals  1 tablet Oral Daily  . omega-3 acid ethyl esters  1 g Oral Daily  . oxyCODONE  10 mg Oral Q12H  . sodium chloride flush  10-40 mL Intracatheter Q12H   Continuous Infusions: . ertapenem Stopped (10/23/19 1511)     LOS: 9 days    Time spent:40 min    Rylann Munford, Geraldo Docker, MD [facility] Pager [phone]  If 7PM-7AM, please contact night-coverage [email or web address] Password [email or web address] 10/23/2019, 6:18 PM

## 2019-10-23 NOTE — Progress Notes (Signed)
PT Cancellation Note  Patient Details Name: Alexis Ball MRN: [id number] DOB: Feb 22, 1956   Cancelled Treatment:    Reason Eval/Treat Not Completed: (P) Other (comment) (Pt supine in bed reports her daughter is bringing her food and she needs her "energy" before she can participate in PT.  Will f/u per POC for curb negotiation.)   [name] 10/23/2019, 10:26 AM Erasmo Leventhal , PTA Acute Rehabilitation Services Pager [phone] Office [phone]

## 2019-10-23 NOTE — Plan of Care (Signed)

## 2019-10-23 NOTE — Progress Notes (Signed)
Physical Therapy Treatment Patient Details Name: Alexis Ball MRN: [id number] DOB: 1955-11-24 Today's Date: 10/23/2019    History of Present Illness Pt is a 64 yo female presenting s/p surgical drainage of L foot infection 7/4 with bone biopsy due to concern for possible osteomyelitis after bunionectomy with silicone implant on 9/93. Further examination reports osteomyelitis with infection of hardware.  Pt s/p I&D with hard ware removal/silicon implant removal and now ex fix and antibiotic spacer placed.  Surgeon also placed wound vac after procedure.   PMH includes: HTN, and stroke.    PT Comments    Pt supine in bed on arrival this session.  Pt motivated to move uopn PTAs return.  Focused on gt training and progression to curb negotiation.  Pt following commands well and able to maintain weight bearing.  Continue to recommend HHPT as she is progressing well.     Follow Up Recommendations  Home health PT;Supervision/Assistance - 24 hour     Equipment Recommendations  None recommended by PT    Recommendations for Other Services       Precautions / Restrictions Precautions Precautions: Fall Required Braces or Orthoses: Other Brace Other Brace: CAM boot LLE- difficult to tighten over ex fix. Restrictions Weight Bearing Restrictions: Yes LLE Weight Bearing: Weight bearing as tolerated LLE Partial Weight Bearing Percentage or Pounds: WBAT in heel with CAM walker donned    Mobility  Bed Mobility Overal bed mobility: Needs Assistance Bed Mobility: Supine to Sit     Supine to sit: Supervision     General bed mobility comments: Increased time and effort but able to move to edge of bed against gravity to lower LLE to floor.  Transfers Overall transfer level: Needs assistance Equipment used: Rolling walker (2 wheeled) Transfers: Sit to/from Stand Sit to Stand: Min guard         General transfer comment: Min guard for safety.  Pt required cues for hand placement.  Poor  eccentric load to seated surface.  Ambulation/Gait Ambulation/Gait assistance: Min guard Gait Distance (Feet): 65 Feet Assistive device: Rolling walker (2 wheeled) Gait Pattern/deviations: Step-to pattern;Decreased stride length;Trunk flexed     General Gait Details: Pt required cues for weight bearing and safety with RW. Pt with improved upright posture today but still mild trunk flexion no rest breaks needed.   Stairs Stairs: Yes Stairs assistance: Min assist Stair Management: Backwards;With walker Number of Stairs: 1 General stair comments: Cues for sequencing and foot placement.  Cues for RW sequencing as well.   Wheelchair Mobility    Modified Rankin (Stroke Patients Only)       Balance Overall balance assessment: Needs assistance Sitting-balance support: Feet supported;No upper extremity supported Sitting balance-Leahy Scale: Good Sitting balance - Comments: abe to complete pericare on BSC without assist or LOB     Standing balance-Leahy Scale: Poor Standing balance comment: Requires UE support in standing, able to maintain static stand with single UE support                            Cognition Arousal/Alertness: Awake/alert Behavior During Therapy: WFL for tasks assessed/performed                                   General Comments: pt with good recal of WB status, good adherence with mobility, agreeable to all education      Exercises  General Comments        Pertinent Vitals/Pain Pain Assessment: 0-10 Pain Score: 6  Pain Location: surgical site Pain Descriptors / Indicators: Sore Pain Intervention(s): Monitored during session;Repositioned    Home Living                      Prior Function            PT Goals (current goals can now be found in the care plan section) Acute Rehab PT Goals Patient Stated Goal: return home Potential to Achieve Goals: Good Progress towards PT goals: Progressing toward  goals    Frequency    Min 3X/week      PT Plan Current plan remains appropriate    Co-evaluation              AM-PAC PT "6 Clicks" Mobility   Outcome Measure  Help needed turning from your back to your side while in a flat bed without using bedrails?: None Help needed moving from lying on your back to sitting on the side of a flat bed without using bedrails?: None Help needed moving to and from a bed to a chair (including a wheelchair)?: A Little Help needed standing up from a chair using your arms (e.g., wheelchair or bedside chair)?: A Little Help needed to walk in hospital room?: A Little Help needed climbing 3-5 steps with a railing? : A Little 6 Click Score: 20    End of Session Equipment Utilized During Treatment: Gait belt;Other (comment) Activity Tolerance: Patient tolerated treatment well Patient left: in chair;with call bell/phone within reach;with family/visitor present Nurse Communication: Mobility status PT Visit Diagnosis: Other abnormalities of gait and mobility (R26.89);Muscle weakness (generalized) (M62.81);Pain Pain - Right/Left: Left Pain - part of body: Ankle and joints of foot     Time: 4599-7741 PT Time Calculation (min) (ACUTE ONLY): 32 min  Charges:  $Gait Training: 8-22 mins $Therapeutic Activity: 8-22 mins                     Alexis Ball , PTA Acute Rehabilitation Services Pager [phone] Office [phone]     Alexis Ball 10/23/2019, 12:56 PM

## 2019-10-23 NOTE — TOC Progression Note (Addendum)
Transition of Care [facility]) - Progression Note    Patient Details  Name: TALAYIA HJORT MRN: [id number] Date of Birth: 25-Aug-1955  Transition of Care [facility]) CM/SW Contact  Sharin Mons, RN Phone Number: 10/23/2019, 12:36 PM  Clinical Narrative:    NCM noted SNF bed acceptances. Shared with pt and daughter Ronny Bacon. [facility] selected. NCM called and texted admission liaison, awaiting response.  TOC team will continue to monitor....  Expected Discharge Plan: Skilled Nursing Facility Barriers to Discharge: Ship broker  Expected Discharge Plan and Services Expected Discharge Plan: [place]     Post Acute Care Choice: [place]       Social Determinants of Health (SDOH) Interventions    Readmission Risk Interventions No flowsheet data found.

## 2019-10-24 DIAGNOSIS — L089 Local infection of the skin and subcutaneous tissue, unspecified: Secondary | ICD-10-CM | POA: Diagnosis not present

## 2019-10-24 DIAGNOSIS — L02612 Cutaneous abscess of left foot: Secondary | ICD-10-CM | POA: Diagnosis not present

## 2019-10-24 DIAGNOSIS — T148XXA Other injury of unspecified body region, initial encounter: Secondary | ICD-10-CM | POA: Diagnosis not present

## 2019-10-24 LAB — PHOSPHORUS: Phosphorus: 4.3 mg/dL (ref 2.5–4.6)

## 2019-10-24 LAB — COMPREHENSIVE METABOLIC PANEL
ALT: 45 U/L — ABNORMAL HIGH (ref 0–44)
AST: 39 U/L (ref 15–41)
Albumin: 2.3 g/dL — ABNORMAL LOW (ref 3.5–5.0)
Alkaline Phosphatase: 63 U/L (ref 38–126)
Anion gap: 8 (ref 5–15)
BUN: 12 mg/dL (ref 8–23)
CO2: 26 mmol/L (ref 22–32)
Calcium: 8.9 mg/dL (ref 8.9–10.3)
Chloride: 104 mmol/L (ref 98–111)
Creatinine, Ser: 0.69 mg/dL (ref 0.44–1.00)
GFR calc Af Amer: >60 mL/min (ref >60)
GFR calc non Af Amer: >60 mL/min (ref >60)
Glucose, Bld: 100 mg/dL — ABNORMAL HIGH (ref 70–99)
Potassium: 4.2 mmol/L (ref 3.5–5.1)
Sodium: 138 mmol/L (ref 135–145)
Total Bilirubin: 0.4 mg/dL (ref 0.3–1.2)
Total Protein: 6 g/dL — ABNORMAL LOW (ref 6.5–8.1)

## 2019-10-24 LAB — CBC WITH DIFFERENTIAL/PLATELET
Abs Immature Granulocytes: 0.05 10*3/uL (ref 0.00–0.07)
Basophils Absolute: 0.1 10*3/uL (ref 0.0–0.1)
Basophils Relative: 1 %
Eosinophils Absolute: 0.2 10*3/uL (ref 0.0–0.5)
Eosinophils Relative: 3 %
HCT: 30 % — ABNORMAL LOW (ref 36.0–46.0)
Hemoglobin: 9.5 g/dL — ABNORMAL LOW (ref 12.0–15.0)
Immature Granulocytes: 1 %
Lymphocytes Relative: 23 %
Lymphs Abs: 1.6 10*3/uL (ref 0.7–4.0)
MCH: 30 pg (ref 26.0–34.0)
MCHC: 31.7 g/dL (ref 30.0–36.0)
MCV: 94.6 fL (ref 80.0–100.0)
Monocytes Absolute: 0.8 10*3/uL (ref 0.1–1.0)
Monocytes Relative: 11 %
Neutro Abs: 4.2 10*3/uL (ref 1.7–7.7)
Neutrophils Relative %: 61 %
Platelets: 310 10*3/uL (ref 150–400)
RBC: 3.17 MIL/uL — ABNORMAL LOW (ref 3.87–5.11)
RDW: 13 % (ref 11.5–15.5)
WBC: 6.8 10*3/uL (ref 4.0–10.5)
nRBC: 0 % (ref 0.0–0.2)

## 2019-10-24 LAB — MAGNESIUM: Magnesium: 1.8 mg/dL (ref 1.7–2.4)

## 2019-10-24 NOTE — Consult Note (Signed)
WOC Nurse Consult Note: Reason for Consult: Vac dressing change to left foot.  Pt had surgery and is followed by podiatry for plan of care.   External fixator and 2 full thickness post-op wounds to left foot.  Measurement: Medicated for pain prior to the procedure and tolerated with mod amt discomfort.  Anterior foot with red moist wounds; 5X2X.2cm and 3X2X.2cm, separated by narrow bridge of skin, visible tendon near great toe. Applied a barrier ring to attempt to maintain a seal aound the edges, then Mepitel contact layer and 2 pieces black foam and covered with drape and attached to 45mm cont suction.  Small amt pink drainage,  applied ace wrap.  Heel floated to reduce pressure.  Home Vac machine and supplies in the room for use after discharge.  Pt will need home health assistance after discharge for dressing changes 3 times a week. Julien Girt MSN, RN, [place], [place], [place]

## 2019-10-24 NOTE — Progress Notes (Signed)
PROGRESS NOTE    Alexis Ball  NLG:[id number] DOB: June 22, 1955 DOA: 10/13/2019 PCP: Lucianne Lei, MD    Brief Narrative:  64 year old with history of hypertension, remote CVA, recent bunionectomy with silicone implant of her left foot on June 25, presented to the hospital with worsening pain and failed outpatient antibiotic therapy.  Her wound was open with maggots coming out as per reports.  Admitted to the hospital and underwent surgical debridement and external fixation.  On antibiotics, waiting to go to a skilled nursing facility.   Assessment & Plan:   Active Problems:   Abscess of bursa of left foot   Foot abscess, left   Wound infection  Left forefoot abscess, cellulitis and osteomyelitis: 7/4, incision drainage by Dr. March Rummage 7/7, debridement and external fixation. Removal of Silicone Implant; Insertion of Antibiotic Spacer; Application of External Fixator; Possible Application of Wound VAC (Left)  Wound culture positive for Enterobacter cloacae and Finegoldia Magna, initially treated with multiple antibiotics, now remains on ertapenem until 8/18 through her PICC line as per ID recommendation. Clinically improving.  Wound VAC in place.  To go to a skilled nursing facility for skilled care.  Hypertension: Blood pressure is stable.  Hypokalemia/hypomagnesemia: Improved.  Left foot pain: Pain controlled on opiates and nonopiates.  DVT prophylaxis: enoxaparin (LOVENOX) injection 40 mg Start: 10/15/19 0800   Code Status: Full code Family Communication: None today Disposition Plan: Status is: Inpatient  Remains inpatient appropriate because:Unsafe d/c plan and IV treatments appropriate due to intensity of illness or inability to take PO   Dispo: The patient is from: Home              Anticipated d/c is to: SNF              Anticipated d/c date is: 1 day              Patient currently is medically stable to d/c.  Only to transfer to skilled level of care.          Consultants:   Infectious disease  Podiatry surgery  Procedures:   See above  Antimicrobials:  Anti-infectives (From admission, onward)   Start     Dose/Rate Route Frequency Ordered Stop   10/18/19 1400  ertapenem (INVANZ) 1,000 mg in sodium chloride 0.9 % 100 mL IVPB     Discontinue     1 g 200 mL/hr over 30 Minutes Intravenous Every 24 hours 10/18/19 0909     10/17/19 1739  vancomycin (VANCOCIN) powder  Status:  Discontinued          As needed 10/17/19 1740 10/17/19 1820   10/16/19 1400  ceFEPIme (MAXIPIME) 2 g in sodium chloride 0.9 % 100 mL IVPB  Status:  Discontinued        2 g 200 mL/hr over 30 Minutes Intravenous Every 8 hours 10/16/19 1233 10/18/19 0909   10/15/19 2100  vancomycin (VANCOCIN) IVPB 1000 mg/200 mL premix  Status:  Discontinued        1,000 mg 200 mL/hr over 60 Minutes Intravenous Every 12 hours 10/15/19 1155 10/16/19 1233   10/15/19 0800  vancomycin (VANCOREADY) IVPB 750 mg/150 mL  Status:  Discontinued        750 mg 150 mL/hr over 60 Minutes Intravenous Every 12 hours 10/14/19 1806 10/15/19 1155   10/14/19 1830  piperacillin-tazobactam (ZOSYN) IVPB 3.375 g  Status:  Discontinued        3.375 g 12.5 mL/hr over 240 Minutes Intravenous Every 8 hours 10/14/19  1806 10/16/19 1234   10/14/19 1800  vancomycin (VANCOREADY) IVPB 1500 mg/300 mL        1,500 mg 150 mL/hr over 120 Minutes Intravenous  Once 10/14/19 1745 10/14/19 2051   10/14/19 1440  vancomycin (VANCOCIN) powder  Status:  Discontinued          As needed 10/14/19 1531 10/14/19 1534   10/14/19 1400  ceFAZolin (ANCEF) IVPB 1 g/50 mL premix  Status:  Discontinued        1 g 100 mL/hr over 30 Minutes Intravenous Every 8 hours 10/14/19 1135 10/14/19 1741   10/14/19 1000  doxycycline (VIBRA-TABS) tablet 100 mg  Status:  Discontinued        100 mg Oral Every 12 hours 10/14/19 0840 10/14/19 1745   10/14/19 0630  piperacillin-tazobactam (ZOSYN) IVPB 3.375 g        3.375 g 100 mL/hr over 30 Minutes  Intravenous  Once 10/14/19 0102 10/14/19 0736         Subjective: Patient seen and examined.  No overnight events.  She just had left foot dressing changed and was very happy.  Pictures on her cell phone.  Afebrile.  Looking forward to go to rehab.  Objective: Vitals:   10/23/19 0753 10/23/19 1452 10/24/19 0611 10/24/19 0731  BP: (!) 141/76 133/78 108/60 128/69  Pulse: 76 87 70 70  Resp: 16 17 16 16   Temp: 98.4 F (36.9 C) 98.3 F (36.8 C) 98.3 F (36.8 C) 98.3 F (36.8 C)  TempSrc: Oral Oral Oral Oral  SpO2: 95% 98% 98% 97%  Weight:      Height:        Intake/Output Summary (Last 24 hours) at 10/24/2019 1100 Last data filed at 10/24/2019 0600 Gross per 24 hour  Intake 888.6 ml  Output 301 ml  Net 587.6 ml   Filed Weights   10/13/19 2329 10/17/19 1452  Weight: 82.1 kg 82.1 kg    Examination:  General exam: Appears calm and comfortable , on room air. Respiratory system: Clear to auscultation. Respiratory effort normal. Cardiovascular system: S1 & S2 heard,  Gastrointestinal system: soft and non tender. Extremities:  Left forefoot with dressing applied, recently changed wound VAC dressing, not removed by me. External fixators on the forefoot.    Data Reviewed: I have personally reviewed following labs and imaging studies  CBC: Recent Labs  Lab 10/20/19 0412 10/21/19 0356 10/22/19 0325 10/23/19 0426 10/24/19 0440  WBC 8.9 8.7 7.8 8.6 6.8  NEUTROABS 5.9 5.8 5.0 5.8 4.2  HGB 9.6* 9.6* 9.6* 9.6* 9.5*  HCT 30.0* 30.0* 30.3* 30.5* 30.0*  MCV 92.3 92.0 91.5 92.7 94.6  PLT 336 321 324 298 725   Basic Metabolic Panel: Recent Labs  Lab 10/20/19 0412 10/21/19 0356 10/22/19 0325 10/23/19 0426 10/24/19 0440  NA 138 137 138 137 138  K 4.4 4.2 4.0 3.9 4.2  CL 107 104 104 105 104  CO2 25 27 26 23 26   GLUCOSE 107* 105* 105* 99 100*  BUN 9 11 10 10 12   CREATININE 0.74 0.72 0.74 0.73 0.69  CALCIUM 8.8* 8.8* 9.0 9.0 8.9  MG 1.9 2.0 2.0 1.8 1.8  PHOS 3.1 3.8  4.0 3.8 4.3   GFR: Estimated Creatinine Clearance: 74 mL/min (by C-G formula based on SCr of 0.69 mg/dL). Liver Function Tests: Recent Labs  Lab 10/20/19 0412 10/21/19 0356 10/22/19 0325 10/23/19 0426 10/24/19 0440  AST 27 21 38 48* 39  ALT 23 19 30  46* 45*  ALKPHOS 62  58 64 65 63  BILITOT 0.4 0.2* 0.5 0.4 0.4  PROT 5.7* 5.8* 5.9* 5.8* 6.0*  ALBUMIN 2.3* 2.3* 2.4* 2.4* 2.3*   No results for input(s): LIPASE, AMYLASE in the last 168 hours. No results for input(s): AMMONIA in the last 168 hours. Coagulation Profile: No results for input(s): INR, PROTIME in the last 168 hours. Cardiac Enzymes: No results for input(s): CKTOTAL, CKMB, CKMBINDEX, TROPONINI in the last 168 hours. BNP (last 3 results) No results for input(s): PROBNP in the last 8760 hours. HbA1C: No results for input(s): HGBA1C in the last 72 hours. CBG: No results for input(s): GLUCAP in the last 168 hours. Lipid Profile: No results for input(s): CHOL, HDL, LDLCALC, TRIG, CHOLHDL, LDLDIRECT in the last 72 hours. Thyroid Function Tests: No results for input(s): TSH, T4TOTAL, FREET4, T3FREE, THYROIDAB in the last 72 hours. Anemia Panel: No results for input(s): VITAMINB12, FOLATE, FERRITIN, TIBC, IRON, RETICCTPCT in the last 72 hours. Sepsis Labs: No results for input(s): PROCALCITON, LATICACIDVEN in the last 168 hours.  Recent Results (from the past 240 hour(s))  Aerobic/Anaerobic Culture (surgical/deep wound)     Status: None   Collection Time: 10/14/19  2:37 PM   Specimen: Soft Tissue, Other; Body Fluid  Result Value Ref Range Status   Specimen Description TISSUE SWAB  Final   Special Requests 2ND LEFTFOOT  Final   Gram Stain NO WBC SEEN NO ORGANISMS SEEN   Final   Culture   Final    FEW ENTEROBACTER CLOACAE NO ANAEROBES ISOLATED Performed at [facility], [address] [place], [place] [address]    Report Status 10/19/2019 FINAL  Final   Organism ID, Bacteria ENTEROBACTER CLOACAE  Final       Susceptibility   Enterobacter cloacae - MIC*    CEFAZOLIN >=64 RESISTANT Resistant     CEFEPIME <=0.12 SENSITIVE Sensitive     CEFTAZIDIME <=1 SENSITIVE Sensitive     CIPROFLOXACIN <=0.25 SENSITIVE Sensitive     GENTAMICIN <=1 SENSITIVE Sensitive     IMIPENEM 0.5 SENSITIVE Sensitive     TRIMETH/SULFA <=20 SENSITIVE Sensitive     PIP/TAZO <=4 SENSITIVE Sensitive     * FEW ENTEROBACTER CLOACAE  Aerobic/Anaerobic Culture (surgical/deep wound)     Status: None   Collection Time: 10/14/19  2:42 PM   Specimen: Soft Tissue, Other; Body Fluid  Result Value Ref Range Status   Specimen Description BONE  Final   Special Requests PROXIMAL PHALANX LEFT  Final   Gram Stain   Final    RARE WBC PRESENT, PREDOMINANTLY PMN RARE GRAM POSITIVE COCCI RARE GRAM VARIABLE ROD Performed at [facility], [place] [address] [place], [place] [address]    Culture FEW ENTEROBACTER CLOACAE FEW Meredith Pel   Final   Report Status 10/17/2019 FINAL  Final   Organism ID, Bacteria ENTEROBACTER CLOACAE  Final      Susceptibility   Enterobacter cloacae - MIC*    CEFAZOLIN >=64 RESISTANT Resistant     CEFEPIME <=0.12 SENSITIVE Sensitive     CEFTAZIDIME <=1 SENSITIVE Sensitive     CIPROFLOXACIN <=0.25 SENSITIVE Sensitive     GENTAMICIN <=1 SENSITIVE Sensitive     IMIPENEM 0.5 SENSITIVE Sensitive     TRIMETH/SULFA <=20 SENSITIVE Sensitive     PIP/TAZO <=4 SENSITIVE Sensitive     * FEW ENTEROBACTER CLOACAE  Aerobic/Anaerobic Culture (surgical/deep wound)     Status: None   Collection Time: 10/14/19  2:43 PM   Specimen: Other Source; Tissue  Result Value Ref Range Status   Specimen Description TISSUE SWAB  Final   Special Requests 2ND INTERSPACE  Final   Gram Stain   Final    RARE WBC PRESENT, PREDOMINANTLY PMN NO ORGANISMS SEEN    Culture   Final    FEW ENTEROBACTER CLOACAE NO ANAEROBES ISOLATED Performed at [facility], [address], [place], [place] [address]    Report Status  10/19/2019 FINAL  Final   Organism ID, Bacteria ENTEROBACTER CLOACAE  Final      Susceptibility   Enterobacter cloacae - MIC*    CEFAZOLIN >=64 RESISTANT Resistant     CEFEPIME <=0.12 SENSITIVE Sensitive     CEFTAZIDIME <=1 SENSITIVE Sensitive     CIPROFLOXACIN <=0.25 SENSITIVE Sensitive     GENTAMICIN <=1 SENSITIVE Sensitive     IMIPENEM 0.5 SENSITIVE Sensitive     TRIMETH/SULFA <=20 SENSITIVE Sensitive     PIP/TAZO <=4 SENSITIVE Sensitive     * FEW ENTEROBACTER CLOACAE  Surgical pcr screen     Status: None   Collection Time: 10/16/19  6:42 PM   Specimen: Nasal Mucosa; Nasal Swab  Result Value Ref Range Status   MRSA, PCR NEGATIVE NEGATIVE Final   Staphylococcus aureus NEGATIVE NEGATIVE Final    Comment: (NOTE) The Xpert SA Assay (FDA approved for NASAL specimens in patients 45 years of age and older), is one component of a comprehensive surveillance program. It is not intended to diagnose infection nor to guide or monitor treatment. Performed at [facility], [place] [address] [place], [place] [address]          Radiology Studies: No results found.      Scheduled Meds: . vitamin C  500 mg Oral Daily  . aspirin EC  81 mg Oral Daily  . brimonidine  1 drop Both Eyes BID  . Chlorhexidine Gluconate Cloth  6 each Topical Daily  . dorzolamide-timolol  1 drop Both Eyes BID  . enoxaparin (LOVENOX) injection  40 mg Subcutaneous Q24H  . ferrous sulfate  325 mg Oral Q breakfast  . gabapentin  300 mg Oral q morning - 10a   And  . gabapentin  600 mg Oral QHS  . latanoprost  1 drop Both Eyes QHS  . lisinopril  2.5 mg Oral Daily  . methocarbamol  500 mg Oral q1600  . multivitamin with minerals  1 tablet Oral Daily  . omega-3 acid ethyl esters  1 g Oral Daily  . oxyCODONE  10 mg Oral Q12H  . sodium chloride flush  10-40 mL Intracatheter Q12H   Continuous Infusions: . ertapenem Stopped (10/23/19 1511)     LOS: 10 days    Time spent: 30 minutes    Barb Merino, MD [facility] Pager [phone]

## 2019-10-24 NOTE — Plan of Care (Signed)
  Problem: Education: Goal: Knowledge of General Education information will improve Description: Including pain rating scale, medication(s)/side effects and non-pharmacologic comfort measures Outcome: Adequate for Discharge   Problem: Clinical Measurements: Goal: Ability to maintain clinical measurements within normal limits will improve Outcome: Adequate for Discharge   

## 2019-10-24 NOTE — TOC Progression Note (Signed)
Transition of Care [facility]) - Progression Note    Patient Details  Name: LYRIK DOCKSTADER MRN: [id number] Date of Birth: 08/30/1955  Transition [facility]) CM/SW Contact  Sharin Mons, RN Phone Number: [phone] 10/24/2019, 9:28 AM  Clinical Narrative:     [facility] accepted and selected for SNF placement. Insurance authorization pending ... TOC team will continue to monitor.  Expected Discharge Plan: [place] ([place] [facility]) Barriers to Discharge: Insurance Authorization  Expected Discharge Plan and Services Expected Discharge Plan: [place] ([place] [facility])     Post Acute Care Choice: [place]                   DME Arranged:  (IV ABX THERAPY) DME Agency: Other - Comment Roel Cluck) Date DME Agency Contacted: 10/15/19 Time DME Agency Contacted: [phone] Representative spoke with at DME Agency: [place]: PT, RN [place] Agency: [facility] [place] Date [place]: 10/19/19 Time [place]: 9 Representative spoke with at [place]: [place] ([place]) Interventions    Readmission Risk Interventions No flowsheet data found.

## 2019-10-25 DIAGNOSIS — L089 Local infection of the skin and subcutaneous tissue, unspecified: Secondary | ICD-10-CM | POA: Diagnosis not present

## 2019-10-25 DIAGNOSIS — T148XXA Other injury of unspecified body region, initial encounter: Secondary | ICD-10-CM | POA: Diagnosis not present

## 2019-10-25 DIAGNOSIS — L02612 Cutaneous abscess of left foot: Secondary | ICD-10-CM | POA: Diagnosis not present

## 2019-10-25 LAB — COMPREHENSIVE METABOLIC PANEL
ALT: 42 U/L (ref 0–44)
AST: 35 U/L (ref 15–41)
Albumin: 2.5 g/dL — ABNORMAL LOW (ref 3.5–5.0)
Alkaline Phosphatase: 63 U/L (ref 38–126)
Anion gap: 7 (ref 5–15)
BUN: 10 mg/dL (ref 8–23)
CO2: 26 mmol/L (ref 22–32)
Calcium: 8.9 mg/dL (ref 8.9–10.3)
Chloride: 104 mmol/L (ref 98–111)
Creatinine, Ser: 0.71 mg/dL (ref 0.44–1.00)
GFR calc Af Amer: >60 mL/min (ref >60)
GFR calc non Af Amer: >60 mL/min (ref >60)
Glucose, Bld: 101 mg/dL — ABNORMAL HIGH (ref 70–99)
Potassium: 4 mmol/L (ref 3.5–5.1)
Sodium: 137 mmol/L (ref 135–145)
Total Bilirubin: 0.3 mg/dL (ref 0.3–1.2)
Total Protein: 5.7 g/dL — ABNORMAL LOW (ref 6.5–8.1)

## 2019-10-25 LAB — CBC WITH DIFFERENTIAL/PLATELET
Abs Immature Granulocytes: 0.03 10*3/uL (ref 0.00–0.07)
Basophils Absolute: 0.1 10*3/uL (ref 0.0–0.1)
Basophils Relative: 1 %
Eosinophils Absolute: 0.2 10*3/uL (ref 0.0–0.5)
Eosinophils Relative: 3 %
HCT: 30 % — ABNORMAL LOW (ref 36.0–46.0)
Hemoglobin: 9.6 g/dL — ABNORMAL LOW (ref 12.0–15.0)
Immature Granulocytes: 0 %
Lymphocytes Relative: 22 %
Lymphs Abs: 1.5 10*3/uL (ref 0.7–4.0)
MCH: 29.9 pg (ref 26.0–34.0)
MCHC: 32 g/dL (ref 30.0–36.0)
MCV: 93.5 fL (ref 80.0–100.0)
Monocytes Absolute: 0.9 10*3/uL (ref 0.1–1.0)
Monocytes Relative: 13 %
Neutro Abs: 4.1 10*3/uL (ref 1.7–7.7)
Neutrophils Relative %: 61 %
Platelets: 311 10*3/uL (ref 150–400)
RBC: 3.21 MIL/uL — ABNORMAL LOW (ref 3.87–5.11)
RDW: 12.8 % (ref 11.5–15.5)
WBC: 6.7 10*3/uL (ref 4.0–10.5)
nRBC: 0 % (ref 0.0–0.2)

## 2019-10-25 LAB — PHOSPHORUS: Phosphorus: 3.6 mg/dL (ref 2.5–4.6)

## 2019-10-25 LAB — MAGNESIUM: Magnesium: 1.8 mg/dL (ref 1.7–2.4)

## 2019-10-25 NOTE — Progress Notes (Signed)
Physical Therapy Treatment Patient Details Name: Alexis Ball MRN: [id number] DOB: October 12, 1955 Today's Date: 10/25/2019    History of Present Illness Pt is a 64 yo female presenting s/p surgical drainage of L foot infection 7/4 with bone biopsy due to concern for possible osteomyelitis after bunionectomy with silicone implant on 5/17. Further examination reports osteomyelitis with infection of hardware.  Pt s/p I&D with hard ware removal/silicon implant removal and now ex fix and antibiotic spacer placed.  Surgeon also placed wound vac after procedure.   PMH includes: HTN, and stroke.    PT Comments    Pt session focused on transfers from multiple height surfaces as well as increasing ambulation distance with RW. Pt donned shoe on RLE which helped level her hips with ambulation and protect R foot. Worked on ambulating with erect posture. Pt stood from bed, chair, recliner, and toilet with min-guard A/ supervision. PT will continue to follow.   Follow Up Recommendations  Home health PT;Supervision/Assistance - 24 hour, SNF if unable to receive home services     Equipment Recommendations  None recommended by PT    Recommendations for Other Services       Precautions / Restrictions Precautions Precautions: Fall Required Braces or Orthoses: Other Brace Other Brace: CAM boot LLE Restrictions Weight Bearing Restrictions: Yes LLE Weight Bearing: Weight bearing as tolerated LLE Partial Weight Bearing Percentage or Pounds: through heel with CAM walker    Mobility  Bed Mobility Overal bed mobility: Needs Assistance Bed Mobility: Supine to Sit     Supine to sit: Min assist     General bed mobility comments: pt very fearful of lowering LLE to ground and reports family dropped it several times at home. Min A given to LLE but pt largely able to control it herself  Transfers Overall transfer level: Needs assistance Equipment used: Rolling walker (2 wheeled) Transfers: Sit to/from  Stand Sit to Stand: Min guard         General transfer comment: min-guard A from bed and toilet  Ambulation/Gait Ambulation/Gait assistance: Min guard Gait Distance (Feet): 80 Feet Assistive device: Rolling walker (2 wheeled) Gait Pattern/deviations: Step-to pattern;Decreased stride length;Trunk flexed Gait velocity: decreased Gait velocity interpretation: <1.31 ft/sec, indicative of household ambulator General Gait Details: donned shoe on R foot which pt reported helped her to feel more even. Cues for erect posture which improved pt's discomfort and gait pattern.    Stairs             Wheelchair Mobility    Modified Rankin (Stroke Patients Only)       Balance Overall balance assessment: Needs assistance Sitting-balance support: Feet supported;No upper extremity supported Sitting balance-Leahy Scale: Good     Standing balance support: During functional activity Standing balance-Leahy Scale: Poor Standing balance comment: keeps elbows against sink when washing hands in standing                            Cognition Arousal/Alertness: Awake/alert Behavior During Therapy: WFL for tasks assessed/performed Overall Cognitive Status: Within Functional Limits for tasks assessed                                        Exercises General Exercises - Lower Extremity Ankle Circles/Pumps: AROM;Both;10 reps;Seated    General Comments General comments (skin integrity, edema, etc.): had pt work on moving toes once seated  with LLE elevated      Pertinent Vitals/Pain Pain Assessment: 0-10 Faces Pain Scale: Hurts whole lot Pain Location: surgical site Pain Descriptors / Indicators: Sore;Discomfort Pain Intervention(s): Limited activity within patient's tolerance;Monitored during session    Home Living                      Prior Function            PT Goals (current goals can now be found in the care plan section) Acute Rehab PT  Goals Patient Stated Goal: return home PT Goal Formulation: With patient Time For Goal Achievement: 10/29/19 Potential to Achieve Goals: Good Progress towards PT goals: Progressing toward goals    Frequency    Min 3X/week      PT Plan Current plan remains appropriate    Co-evaluation              AM-PAC PT "6 Clicks" Mobility   Outcome Measure  Help needed turning from your back to your side while in a flat bed without using bedrails?: None Help needed moving from lying on your back to sitting on the side of a flat bed without using bedrails?: None Help needed moving to and from a bed to a chair (including a wheelchair)?: A Little Help needed standing up from a chair using your arms (e.g., wheelchair or bedside chair)?: A Little Help needed to walk in hospital room?: A Little Help needed climbing 3-5 steps with a railing? : A Little 6 Click Score: 20    End of Session Equipment Utilized During Treatment: Gait belt;Other (comment) (wound vac) Activity Tolerance: Patient tolerated treatment well Patient left: in chair;with call bell/phone within reach Nurse Communication: Mobility status PT Visit Diagnosis: Other abnormalities of gait and mobility (R26.89);Muscle weakness (generalized) (M62.81);Pain Pain - Right/Left: Left Pain - part of body: Ankle and joints of foot     Time: 6712-4580 PT Time Calculation (min) (ACUTE ONLY): 41 min  Charges:  $Gait Training: 23-37 mins $Therapeutic Activity: 8-22 mins                     Leighton Roach, [place]  Pager [phone] Office [place] 10/25/2019, 3:05 PM

## 2019-10-25 NOTE — Plan of Care (Signed)

## 2019-10-25 NOTE — Plan of Care (Signed)

## 2019-10-25 NOTE — TOC Progression Note (Signed)
Transition of Care [facility]) - Progression Note    Patient Details  Name: Alexis Ball MRN: [id number] Date of Birth: Aug 27, 1955  Transition of [facility]) CM/SW Contact  Sharin Mons, RN Phone Number: 10/25/2019, 1:19 PM  Clinical Narrative:    Insurance authorization pending for SNF/ [employer]. TOC team will continue monitor and follow ....   Expected Discharge Plan: [place] ([place]) Barriers to Discharge: Insurance Authorization  Expected Discharge Plan and Services Expected Discharge Plan: [place] ([place])     Post Acute Care Choice: [place]                   DME Arranged:  (IV ABX THERAPY) DME Agency: Other - Comment Roel Cluck) Date DME Agency Contacted: 10/15/19 Time DME Agency Contacted: [phone] Representative spoke with at DME Agency: [place]: PT, RN [place] Agency: [facility] [place] Date [place]: 10/19/19 Time [place]: 31 Representative spoke with at [place]: [place] ([place]) Interventions    Readmission Risk Interventions No flowsheet data found.

## 2019-10-25 NOTE — Progress Notes (Signed)
PROGRESS NOTE    Alexis Ball  FUX:[id number] DOB: 12-Apr-1956 DOA: 10/13/2019 PCP: Lucianne Lei, MD    Brief Narrative:  64 year old with history of hypertension, remote CVA, recent bunionectomy with silicone implant of her left foot on June 25, presented to the hospital with worsening pain and failed outpatient antibiotic therapy.  Her wound was open with maggots coming out as per reports.  Admitted to the hospital and underwent surgical debridement and external fixation.  On antibiotics, waiting to go to a skilled nursing facility.   Assessment & Plan:   Active Problems:   Abscess of bursa of left foot   Foot abscess, left   Wound infection  Left forefoot abscess, cellulitis and osteomyelitis: 7/4, incision drainage by Dr. March Rummage 7/7, debridement and external fixation. Removal of Silicone Implant; Insertion of Antibiotic Spacer; Application of External Fixator; Possible Application of Wound VAC (Left)  Wound culture positive for Enterobacter cloacae and Finegoldia Magna, initially treated with multiple antibiotics, now remains on ertapenem until 8/18 through her PICC line as per ID recommendation. Clinically improving.  Wound VAC in place.  skilled nursing facility for skilled care.  Hypertension: Blood pressure is stable.  Hypokalemia/hypomagnesemia: Improved.  Left foot pain: Pain controlled on opiates and nonopiates.  DVT prophylaxis: enoxaparin (LOVENOX) injection 40 mg Start: 10/15/19 0800   Code Status: Full code Family Communication: Patient's daughter at the bedside. Disposition Plan: Status is: Inpatient  Remains inpatient appropriate because:Unsafe d/c plan and IV treatments appropriate due to intensity of illness or inability to take PO   Dispo: The patient is from: Home              Anticipated d/c is to: SNF              Anticipated d/c date is: 1 day              Patient currently is medically stable to d/c.  Only to transfer to skilled level of  care.         Consultants:   Infectious disease  Podiatry surgery  Procedures:   See above  Antimicrobials:  Anti-infectives (From admission, onward)   Start     Dose/Rate Route Frequency Ordered Stop   10/18/19 1400  ertapenem (INVANZ) 1,000 mg in sodium chloride 0.9 % 100 mL IVPB     Discontinue     1 g 200 mL/hr over 30 Minutes Intravenous Every 24 hours 10/18/19 0909     10/17/19 1739  vancomycin (VANCOCIN) powder  Status:  Discontinued          As needed 10/17/19 1740 10/17/19 1820   10/16/19 1400  ceFEPIme (MAXIPIME) 2 g in sodium chloride 0.9 % 100 mL IVPB  Status:  Discontinued        2 g 200 mL/hr over 30 Minutes Intravenous Every 8 hours 10/16/19 1233 10/18/19 0909   10/15/19 2100  vancomycin (VANCOCIN) IVPB 1000 mg/200 mL premix  Status:  Discontinued        1,000 mg 200 mL/hr over 60 Minutes Intravenous Every 12 hours 10/15/19 1155 10/16/19 1233   10/15/19 0800  vancomycin (VANCOREADY) IVPB 750 mg/150 mL  Status:  Discontinued        750 mg 150 mL/hr over 60 Minutes Intravenous Every 12 hours 10/14/19 1806 10/15/19 1155   10/14/19 1830  piperacillin-tazobactam (ZOSYN) IVPB 3.375 g  Status:  Discontinued        3.375 g 12.5 mL/hr over 240 Minutes Intravenous Every 8 hours 10/14/19 1806  10/16/19 1234   10/14/19 1800  vancomycin (VANCOREADY) IVPB 1500 mg/300 mL        1,500 mg 150 mL/hr over 120 Minutes Intravenous  Once 10/14/19 1745 10/14/19 2051   10/14/19 1440  vancomycin (VANCOCIN) powder  Status:  Discontinued          As needed 10/14/19 1531 10/14/19 1534   10/14/19 1400  ceFAZolin (ANCEF) IVPB 1 g/50 mL premix  Status:  Discontinued        1 g 100 mL/hr over 30 Minutes Intravenous Every 8 hours 10/14/19 1135 10/14/19 1741   10/14/19 1000  doxycycline (VIBRA-TABS) tablet 100 mg  Status:  Discontinued        100 mg Oral Every 12 hours 10/14/19 0840 10/14/19 1745   10/14/19 0630  piperacillin-tazobactam (ZOSYN) IVPB 3.375 g        3.375 g 100 mL/hr over  30 Minutes Intravenous  Once 10/14/19 0300 10/14/19 0736         Subjective: Patient seen and examined.  No overnight events.  Daughter is at bedside.  Objective: Vitals:   10/24/19 1358 10/24/19 1921 10/25/19 0257 10/25/19 0958  BP: 106/73 128/61 123/69 126/77  Pulse: 66 73 74 70  Resp: 17 18 16 16   Temp: 98.2 F (36.8 C) 98.1 F (36.7 C) 98.2 F (36.8 C) 98.1 F (36.7 C)  TempSrc: Oral Oral Oral Oral  SpO2: 99% 93% 96% 97%  Weight:      Height:        Intake/Output Summary (Last 24 hours) at 10/25/2019 1414 Last data filed at 10/24/2019 2140 Gross per 24 hour  Intake --  Output 0 ml  Net 0 ml   Filed Weights   10/13/19 2329 10/17/19 1452  Weight: 82.1 kg 82.1 kg    Examination:  General exam: Appears calm and comfortable , on room air. Respiratory system: Clear to auscultation. Respiratory effort normal. Cardiovascular system: S1 & S2 heard,  Gastrointestinal system: soft and non tender. Extremities:  Left forefoot with dressing applied, recently changed wound VAC dressing, not removed by me. External fixators on the forefoot.    Data Reviewed: I have personally reviewed following labs and imaging studies  CBC: Recent Labs  Lab 10/21/19 0356 10/22/19 0325 10/23/19 0426 10/24/19 0440 10/25/19 0357  WBC 8.7 7.8 8.6 6.8 6.7  NEUTROABS 5.8 5.0 5.8 4.2 4.1  HGB 9.6* 9.6* 9.6* 9.5* 9.6*  HCT 30.0* 30.3* 30.5* 30.0* 30.0*  MCV 92.0 91.5 92.7 94.6 93.5  PLT 321 324 298 310 923   Basic Metabolic Panel: Recent Labs  Lab 10/21/19 0356 10/22/19 0325 10/23/19 0426 10/24/19 0440 10/25/19 0357  NA 137 138 137 138 137  K 4.2 4.0 3.9 4.2 4.0  CL 104 104 105 104 104  CO2 27 26 23 26 26   GLUCOSE 105* 105* 99 100* 101*  BUN 11 10 10 12 10   CREATININE 0.72 0.74 0.73 0.69 0.71  CALCIUM 8.8* 9.0 9.0 8.9 8.9  MG 2.0 2.0 1.8 1.8 1.8  PHOS 3.8 4.0 3.8 4.3 3.6   GFR: Estimated Creatinine Clearance: 74 mL/min (by C-G formula based on SCr of 0.71  mg/dL). Liver Function Tests: Recent Labs  Lab 10/21/19 0356 10/22/19 0325 10/23/19 0426 10/24/19 0440 10/25/19 0357  AST 21 38 48* 39 35  ALT 19 30 46* 45* 42  ALKPHOS 58 64 65 63 63  BILITOT 0.2* 0.5 0.4 0.4 0.3  PROT 5.8* 5.9* 5.8* 6.0* 5.7*  ALBUMIN 2.3* 2.4* 2.4* 2.3* 2.5*  No results for input(s): LIPASE, AMYLASE in the last 168 hours. No results for input(s): AMMONIA in the last 168 hours. Coagulation Profile: No results for input(s): INR, PROTIME in the last 168 hours. Cardiac Enzymes: No results for input(s): CKTOTAL, CKMB, CKMBINDEX, TROPONINI in the last 168 hours. BNP (last 3 results) No results for input(s): PROBNP in the last 8760 hours. HbA1C: No results for input(s): HGBA1C in the last 72 hours. CBG: No results for input(s): GLUCAP in the last 168 hours. Lipid Profile: No results for input(s): CHOL, HDL, LDLCALC, TRIG, CHOLHDL, LDLDIRECT in the last 72 hours. Thyroid Function Tests: No results for input(s): TSH, T4TOTAL, FREET4, T3FREE, THYROIDAB in the last 72 hours. Anemia Panel: No results for input(s): VITAMINB12, FOLATE, FERRITIN, TIBC, IRON, RETICCTPCT in the last 72 hours. Sepsis Labs: No results for input(s): PROCALCITON, LATICACIDVEN in the last 168 hours.  Recent Results (from the past 240 hour(s))  Surgical pcr screen     Status: None   Collection Time: 10/16/19  6:42 PM   Specimen: Nasal Mucosa; Nasal Swab  Result Value Ref Range Status   MRSA, PCR NEGATIVE NEGATIVE Final   Staphylococcus aureus NEGATIVE NEGATIVE Final    Comment: (NOTE) The Xpert SA Assay (FDA approved for NASAL specimens in patients 44 years of age and older), is one component of a comprehensive surveillance program. It is not intended to diagnose infection nor to guide or monitor treatment. Performed at [facility], [place] [address], [place], [place] [address]          Radiology Studies: No results found.      Scheduled Meds: . vitamin C  500 mg  Oral Daily  . aspirin EC  81 mg Oral Daily  . brimonidine  1 drop Both Eyes BID  . Chlorhexidine Gluconate Cloth  6 each Topical Daily  . dorzolamide-timolol  1 drop Both Eyes BID  . enoxaparin (LOVENOX) injection  40 mg Subcutaneous Q24H  . ferrous sulfate  325 mg Oral Q breakfast  . gabapentin  300 mg Oral q morning - 10a   And  . gabapentin  600 mg Oral QHS  . latanoprost  1 drop Both Eyes QHS  . lisinopril  2.5 mg Oral Daily  . methocarbamol  500 mg Oral q1600  . multivitamin with minerals  1 tablet Oral Daily  . omega-3 acid ethyl esters  1 g Oral Daily  . oxyCODONE  10 mg Oral Q12H  . sodium chloride flush  10-40 mL Intracatheter Q12H   Continuous Infusions: . ertapenem 1,000 mg (10/25/19 1329)     LOS: 11 days    Time spent: 25 minutes    Barb Merino, MD [facility] Pager [phone]

## 2019-10-26 DIAGNOSIS — L02612 Cutaneous abscess of left foot: Secondary | ICD-10-CM | POA: Diagnosis not present

## 2019-10-26 DIAGNOSIS — T148XXA Other injury of unspecified body region, initial encounter: Secondary | ICD-10-CM | POA: Diagnosis not present

## 2019-10-26 DIAGNOSIS — L089 Local infection of the skin and subcutaneous tissue, unspecified: Secondary | ICD-10-CM | POA: Diagnosis not present

## 2019-10-26 NOTE — Plan of Care (Signed)
Problem: Education: Goal: Knowledge of General Education information will improve Description: Including pain rating scale, medication(s)/side effects and non-pharmacologic comfort measures Outcome: Progressing   Problem: Health Behavior/Discharge Planning: Goal: Ability to manage health-related needs will improve Outcome: Progressing   Problem: Elimination: Goal: Will not experience complications related to bowel motility Outcome: Completed/Met

## 2019-10-26 NOTE — Progress Notes (Signed)
Physical Therapy Treatment Patient Details Name: Alexis Ball MRN: [id number] DOB: 1955/08/17 Today's Date: 10/26/2019    History of Present Illness Pt is a 64 yo female presenting s/p surgical drainage of L foot infection 7/4 with bone biopsy due to concern for possible osteomyelitis after bunionectomy with silicone implant on 6/14. Further examination reports osteomyelitis with infection of hardware.  Pt s/p I&D with hard ware removal/silicon implant removal and now ex fix and antibiotic spacer placed.  Surgeon also placed wound vac after procedure.   PMH includes: HTN, and stroke.    PT Comments    Pt sleeping in bed, easy to rouse.  Pt required assistance to move into and out of bed due to fear of pain in L foot.  Pt performed gt and transfers with min guard assistance.  Continue to recommend HHPT at d/c.     Follow Up Recommendations  Home health PT;Supervision/Assistance - 24 hour     Equipment Recommendations  None recommended by PT    Recommendations for Other Services       Precautions / Restrictions Precautions Precautions: Fall Required Braces or Orthoses: Other Brace Other Brace: CAM boot LLE Restrictions Weight Bearing Restrictions: Yes LLE Weight Bearing: Weight bearing as tolerated LLE Partial Weight Bearing Percentage or Pounds: through heel    Mobility  Bed Mobility Overal bed mobility: Needs Assistance Bed Mobility: Supine to Sit     Supine to sit: Min assist Sit to supine: Min assist   General bed mobility comments: Pt required assistance to move LLE out to edge of bed and to return back to bed.  Transfers Overall transfer level: Needs assistance Equipment used: Rolling walker (2 wheeled) Transfers: Sit to/from Stand Sit to Stand: Min guard Stand pivot transfers: Min guard       General transfer comment: min-guard A from bed with cues for safety.  Ambulation/Gait Ambulation/Gait assistance: Min guard Gait Distance (Feet): 80  Feet Assistive device: Rolling walker (2 wheeled) Gait Pattern/deviations: Step-to pattern;Decreased stride length;Trunk flexed Gait velocity: decreased   General Gait Details: donned shoe on R foot which pt reported helped her to feel more even. Cues for erect posture which improved pt's discomfort and gait pattern.    Stairs             Wheelchair Mobility    Modified Rankin (Stroke Patients Only)       Balance Overall balance assessment: Needs assistance Sitting-balance support: Feet supported;No upper extremity supported Sitting balance-Leahy Scale: Good       Standing balance-Leahy Scale: Fair                              Cognition Arousal/Alertness: Awake/alert Behavior During Therapy: WFL for tasks assessed/performed Overall Cognitive Status: Within Functional Limits for tasks assessed                                 General Comments: pt with good recall of WB status, good adherence with mobility, agreeable to all education      Exercises      General Comments        Pertinent Vitals/Pain Pain Assessment: 0-10 Pain Score: 6  Pain Location: surgical site Pain Descriptors / Indicators: Sore;Discomfort Pain Intervention(s): Monitored during session;Repositioned    Home Living  Prior Function            PT Goals (current goals can now be found in the care plan section) Acute Rehab PT Goals Patient Stated Goal: return home Potential to Achieve Goals: Good Progress towards PT goals: Progressing toward goals    Frequency    Min 3X/week      PT Plan Current plan remains appropriate    Co-evaluation              AM-PAC PT "6 Clicks" Mobility   Outcome Measure  Help needed turning from your back to your side while in a flat bed without using bedrails?: None Help needed moving from lying on your back to sitting on the side of a flat bed without using bedrails?: None Help  needed moving to and from a bed to a chair (including a wheelchair)?: A Little Help needed standing up from a chair using your arms (e.g., wheelchair or bedside chair)?: A Little Help needed to walk in hospital room?: A Little Help needed climbing 3-5 steps with a railing? : A Little 6 Click Score: 20    End of Session Equipment Utilized During Treatment: Gait belt Activity Tolerance: Patient tolerated treatment well Patient left: in chair;with call bell/phone within reach Nurse Communication: Mobility status PT Visit Diagnosis: Other abnormalities of gait and mobility (R26.89);Muscle weakness (generalized) (M62.81);Pain Pain - Right/Left: Left Pain - part of body: Ankle and joints of foot     Time: [id number] PT Time Calculation (min) (ACUTE ONLY): 22 min  Charges:  $Gait Training: 8-22 mins                     Erasmo Leventhal , PTA Acute Rehabilitation Services Pager [phone] Office [phone]     [place] Dibella Eli Hose 10/26/2019, 4:48 PM

## 2019-10-26 NOTE — Progress Notes (Signed)
PROGRESS NOTE    Alexis Ball  KGU:[id number] DOB: Jun 22, 1955 DOA: 10/13/2019 PCP: Lucianne Lei, MD    Brief Narrative:  65 year old with history of hypertension, remote CVA, recent bunionectomy with silicone implant of her left foot on June 25, presented to the hospital with worsening pain and failed outpatient antibiotic therapy.  Her wound was open with maggots coming out as per reports.  Admitted to the hospital and underwent surgical debridement and external fixation.  On antibiotics, waiting to go to a skilled nursing facility.   Assessment & Plan:   Active Problems:   Abscess of bursa of left foot   Foot abscess, left   Wound infection  Left forefoot abscess, cellulitis and osteomyelitis: 7/4, incision drainage by Dr. March Rummage 7/7, debridement and external fixation. Removal of Silicone Implant; Insertion of Antibiotic Spacer; Application of External Fixator; Possible Application of Wound VAC (Left)  Wound culture positive for Enterobacter cloacae and Finegoldia Magna, initially treated with multiple antibiotics, now remains on ertapenem until 8/18 through her PICC line as per ID recommendation. Clinically improving.  Wound VAC in place.  skilled nursing facility for skilled care.  Hypertension: Blood pressure is stable.  Hypokalemia/hypomagnesemia: Improved.  Left foot pain: Pain controlled on opiates and nonopiates.  DVT prophylaxis: enoxaparin (LOVENOX) injection 40 mg Start: 10/15/19 0800   Code Status: Full code Family Communication: Patient's daughter at the bedside 7/15. Disposition Plan: Status is: Inpatient  Remains inpatient appropriate because:Unsafe d/c plan and IV treatments appropriate due to intensity of illness or inability to take PO  Patient is medically stable to transfer to a skilled level of care only.   Dispo: The patient is from: Home              Anticipated d/c is to: SNF              Anticipated d/c date is: When bed is available.               Patient currently is medically stable to d/c.  Only to transfer to skilled level of care.         Consultants:   Infectious disease  Podiatry surgery  Procedures:   See above  Antimicrobials:  Anti-infectives (From admission, onward)   Start     Dose/Rate Route Frequency Ordered Stop   10/18/19 1400  ertapenem (INVANZ) 1,000 mg in sodium chloride 0.9 % 100 mL IVPB     Discontinue     1 g 200 mL/hr over 30 Minutes Intravenous Every 24 hours 10/18/19 0909     10/17/19 1739  vancomycin (VANCOCIN) powder  Status:  Discontinued          As needed 10/17/19 1740 10/17/19 1820   10/16/19 1400  ceFEPIme (MAXIPIME) 2 g in sodium chloride 0.9 % 100 mL IVPB  Status:  Discontinued        2 g 200 mL/hr over 30 Minutes Intravenous Every 8 hours 10/16/19 1233 10/18/19 0909   10/15/19 2100  vancomycin (VANCOCIN) IVPB 1000 mg/200 mL premix  Status:  Discontinued        1,000 mg 200 mL/hr over 60 Minutes Intravenous Every 12 hours 10/15/19 1155 10/16/19 1233   10/15/19 0800  vancomycin (VANCOREADY) IVPB 750 mg/150 mL  Status:  Discontinued        750 mg 150 mL/hr over 60 Minutes Intravenous Every 12 hours 10/14/19 1806 10/15/19 1155   10/14/19 1830  piperacillin-tazobactam (ZOSYN) IVPB 3.375 g  Status:  Discontinued  3.375 g 12.5 mL/hr over 240 Minutes Intravenous Every 8 hours 10/14/19 1806 10/16/19 1234   10/14/19 1800  vancomycin (VANCOREADY) IVPB 1500 mg/300 mL        1,500 mg 150 mL/hr over 120 Minutes Intravenous  Once 10/14/19 1745 10/14/19 2051   10/14/19 1440  vancomycin (VANCOCIN) powder  Status:  Discontinued          As needed 10/14/19 1531 10/14/19 1534   10/14/19 1400  ceFAZolin (ANCEF) IVPB 1 g/50 mL premix  Status:  Discontinued        1 g 100 mL/hr over 30 Minutes Intravenous Every 8 hours 10/14/19 1135 10/14/19 1741   10/14/19 1000  doxycycline (VIBRA-TABS) tablet 100 mg  Status:  Discontinued        100 mg Oral Every 12 hours 10/14/19 0840 10/14/19 1745    10/14/19 0630  piperacillin-tazobactam (ZOSYN) IVPB 3.375 g        3.375 g 100 mL/hr over 30 Minutes Intravenous  Once 10/14/19 7654 10/14/19 0736         Subjective: Patient seen and examined.  No overnight events.  Had dressing change earlier today and was happy with the pictures.  Objective: Vitals:   10/25/19 1610 10/25/19 2042 10/26/19 0506 10/26/19 0832  BP: 138/89 (!) 143/77 121/82 128/61  Pulse: 68 68 67 67  Resp: 19 16 16 17   Temp: 98.4 F (36.9 C) 98.7 F (37.1 C) 98.5 F (36.9 C) 97.9 F (36.6 C)  TempSrc:  Oral Oral Oral  SpO2: 100% 95% 98% 95%  Weight:      Height:        Intake/Output Summary (Last 24 hours) at 10/26/2019 0959 Last data filed at 10/25/2019 2102 Gross per 24 hour  Intake 100 ml  Output 0 ml  Net 100 ml   Filed Weights   10/13/19 2329 10/17/19 1452  Weight: 82.1 kg 82.1 kg    Examination:  General exam: Appears calm and comfortable , on room air. Respiratory system: Clear to auscultation. Respiratory effort normal. Cardiovascular system: S1 & S2 heard,  Gastrointestinal system: soft and non tender. Extremities:  Left forefoot with dressing applied, just changed wound VAC dressing, not removed by me. External fixators on the forefoot.    Data Reviewed: I have personally reviewed following labs and imaging studies  CBC: Recent Labs  Lab 10/21/19 0356 10/22/19 0325 10/23/19 0426 10/24/19 0440 10/25/19 0357  WBC 8.7 7.8 8.6 6.8 6.7  NEUTROABS 5.8 5.0 5.8 4.2 4.1  HGB 9.6* 9.6* 9.6* 9.5* 9.6*  HCT 30.0* 30.3* 30.5* 30.0* 30.0*  MCV 92.0 91.5 92.7 94.6 93.5  PLT 321 324 298 310 650   Basic Metabolic Panel: Recent Labs  Lab 10/21/19 0356 10/22/19 0325 10/23/19 0426 10/24/19 0440 10/25/19 0357  NA 137 138 137 138 137  K 4.2 4.0 3.9 4.2 4.0  CL 104 104 105 104 104  CO2 27 26 23 26 26   GLUCOSE 105* 105* 99 100* 101*  BUN 11 10 10 12 10   CREATININE 0.72 0.74 0.73 0.69 0.71  CALCIUM 8.8* 9.0 9.0 8.9 8.9  MG 2.0 2.0  1.8 1.8 1.8  PHOS 3.8 4.0 3.8 4.3 3.6   GFR: Estimated Creatinine Clearance: 74 mL/min (by C-G formula based on SCr of 0.71 mg/dL). Liver Function Tests: Recent Labs  Lab 10/21/19 0356 10/22/19 0325 10/23/19 0426 10/24/19 0440 10/25/19 0357  AST 21 38 48* 39 35  ALT 19 30 46* 45* 42  ALKPHOS 58 64 65 63  63  BILITOT 0.2* 0.5 0.4 0.4 0.3  PROT 5.8* 5.9* 5.8* 6.0* 5.7*  ALBUMIN 2.3* 2.4* 2.4* 2.3* 2.5*   No results for input(s): LIPASE, AMYLASE in the last 168 hours. No results for input(s): AMMONIA in the last 168 hours. Coagulation Profile: No results for input(s): INR, PROTIME in the last 168 hours. Cardiac Enzymes: No results for input(s): CKTOTAL, CKMB, CKMBINDEX, TROPONINI in the last 168 hours. BNP (last 3 results) No results for input(s): PROBNP in the last 8760 hours. HbA1C: No results for input(s): HGBA1C in the last 72 hours. CBG: No results for input(s): GLUCAP in the last 168 hours. Lipid Profile: No results for input(s): CHOL, HDL, LDLCALC, TRIG, CHOLHDL, LDLDIRECT in the last 72 hours. Thyroid Function Tests: No results for input(s): TSH, T4TOTAL, FREET4, T3FREE, THYROIDAB in the last 72 hours. Anemia Panel: No results for input(s): VITAMINB12, FOLATE, FERRITIN, TIBC, IRON, RETICCTPCT in the last 72 hours. Sepsis Labs: No results for input(s): PROCALCITON, LATICACIDVEN in the last 168 hours.  Recent Results (from the past 240 hour(s))  Surgical pcr screen     Status: None   Collection Time: 10/16/19  6:42 PM   Specimen: Nasal Mucosa; Nasal Swab  Result Value Ref Range Status   MRSA, PCR NEGATIVE NEGATIVE Final   Staphylococcus aureus NEGATIVE NEGATIVE Final    Comment: (NOTE) The Xpert SA Assay (FDA approved for NASAL specimens in patients 77 years of age and older), is one component of a comprehensive surveillance program. It is not intended to diagnose infection nor to guide or monitor treatment. Performed at [facility], [place] [address]  [place], [place] [address]          Radiology Studies: No results found.      Scheduled Meds: . vitamin C  500 mg Oral Daily  . aspirin EC  81 mg Oral Daily  . brimonidine  1 drop Both Eyes BID  . Chlorhexidine Gluconate Cloth  6 each Topical Daily  . dorzolamide-timolol  1 drop Both Eyes BID  . enoxaparin (LOVENOX) injection  40 mg Subcutaneous Q24H  . ferrous sulfate  325 mg Oral Q breakfast  . gabapentin  300 mg Oral q morning - 10a   And  . gabapentin  600 mg Oral QHS  . latanoprost  1 drop Both Eyes QHS  . lisinopril  2.5 mg Oral Daily  . methocarbamol  500 mg Oral q1600  . multivitamin with minerals  1 tablet Oral Daily  . omega-3 acid ethyl esters  1 g Oral Daily  . oxyCODONE  10 mg Oral Q12H  . sodium chloride flush  10-40 mL Intracatheter Q12H   Continuous Infusions: . ertapenem 1,000 mg (10/25/19 1329)     LOS: 12 days    Time spent: 25 minutes    Barb Merino, MD [facility] Pager [phone]

## 2019-10-26 NOTE — TOC Progression Note (Signed)
Transition of Care [facility]) - Progression Note    Patient Details  Name: Alexis Ball MRN: [id number] Date of Birth: Apr 13, 1955  [facility] CM/SW Contact  Sharin Mons, RN Phone Number: 10/26/2019, 8:57 AM  Clinical Narrative:     Insurance authorization pending for SNF...Marland Kitchen pt with [employer]. Pt will need updated COVID closer to d/c.  TOC team will continue to monitor and follow...   Expected Discharge Plan: [place] ([place]) Barriers to Discharge: Insurance Authorization  Expected Discharge Plan and Services Expected Discharge Plan: [place] ([place])     Post Acute Care Choice: [place]                   DME Arranged:  (IV ABX THERAPY) DME Agency: Other - Comment Roel Cluck) Date DME Agency Contacted: 10/15/19 Time DME Agency Contacted: [phone] Representative spoke with at DME Agency: [place]: PT, RN [place] Agency: [facility] [place] Date [place]: 10/19/19 Time [place]: 56 Representative spoke with at [place]: [place] ([place]) Interventions    Readmission Risk Interventions No flowsheet data found.

## 2019-10-26 NOTE — Consult Note (Signed)
[facility] Nurse Consult Note: Reason for Consult:Vac dressing change to left foot. Pt had surgery and is followed by podiatry for plan of care.  External fixator and 2 full thickness post-op wounds to left foot.  Measurement:Medicated for pain prior to the procedure and tolerated with mod amt discomfort.Anterior foot with red moist wounds; red and moist, separated by narrow bridge of skin, visible tendon near great toe.Applied a barrier ring to attempt to maintain a seal aound the edges, then Mepitel contact layer and 2 pieces black foam and covered with drape and attached to 64mm cont suction. Small amt pink drainage, applied ace wrap. Heel floated to reduce pressure. Home Vac machine and supplies in the room for use after discharge.  Pt will need home health assistance after discharge for dressing changes 3 times a week. Julien Girt MSN, RN, [place], [place], [place]

## 2019-10-27 DIAGNOSIS — L089 Local infection of the skin and subcutaneous tissue, unspecified: Secondary | ICD-10-CM | POA: Diagnosis not present

## 2019-10-27 DIAGNOSIS — T148XXA Other injury of unspecified body region, initial encounter: Secondary | ICD-10-CM | POA: Diagnosis not present

## 2019-10-27 DIAGNOSIS — L02612 Cutaneous abscess of left foot: Secondary | ICD-10-CM | POA: Diagnosis not present

## 2019-10-27 NOTE — Progress Notes (Signed)
PROGRESS NOTE    Alexis Ball  UVO:[id number] DOB: 04/19/1955 DOA: 10/13/2019 PCP: Lucianne Lei, MD    Brief Narrative:  64 year old with history of hypertension, remote CVA, recent bunionectomy with silicone implant of her left foot on June 25, presented to the hospital with worsening pain and failed outpatient antibiotic therapy.  Her wound was open with maggots coming out as per reports.  Admitted to the hospital and underwent surgical debridement and external fixation.  On antibiotics, waiting to go to a skilled nursing facility.   Assessment & Plan:   Active Problems:   Abscess of bursa of left foot   Foot abscess, left   Wound infection  Left forefoot abscess, cellulitis and osteomyelitis: 7/4, incision drainage by Dr. March Rummage 7/7, debridement and external fixation. Removal of Silicone Implant; Insertion of Antibiotic Spacer; Application of External Fixator;  Application of Wound VAC (Left)  Wound culture positive for Enterobacter cloacae and Finegoldia Magna, initially treated with multiple antibiotics, now remains on ertapenem until 8/18 through her PICC line as per ID recommendation. Clinically improving.  Wound VAC in place.  skilled nursing facility for skilled care.  Hypertension: Blood pressure is stable.  Hypokalemia/hypomagnesemia: Improved.  Left foot pain: Pain controlled on opiates and nonopiates.  DVT prophylaxis: enoxaparin (LOVENOX) injection 40 mg Start: 10/15/19 0800   Code Status: Full code Family Communication: Patient's daughter at the bedside 7/15. Disposition Plan: Status is: Inpatient  Remains inpatient appropriate because:Unsafe d/c plan and IV treatments appropriate due to intensity of illness or inability to take PO  Patient is medically stable to transfer to a skilled level of care only.   Dispo: The patient is from: Home              Anticipated d/c is to: SNF              Anticipated d/c date is: When bed is available.               Patient currently is medically stable to d/c.  Only to transfer to skilled level of care.         Consultants:   Infectious disease  Podiatry surgery  Procedures:   See above  Antimicrobials:  Anti-infectives (From admission, onward)   Start     Dose/Rate Route Frequency Ordered Stop   10/18/19 1400  ertapenem (INVANZ) 1,000 mg in sodium chloride 0.9 % 100 mL IVPB     Discontinue     1 g 200 mL/hr over 30 Minutes Intravenous Every 24 hours 10/18/19 0909     10/17/19 1739  vancomycin (VANCOCIN) powder  Status:  Discontinued          As needed 10/17/19 1740 10/17/19 1820   10/16/19 1400  ceFEPIme (MAXIPIME) 2 g in sodium chloride 0.9 % 100 mL IVPB  Status:  Discontinued        2 g 200 mL/hr over 30 Minutes Intravenous Every 8 hours 10/16/19 1233 10/18/19 0909   10/15/19 2100  vancomycin (VANCOCIN) IVPB 1000 mg/200 mL premix  Status:  Discontinued        1,000 mg 200 mL/hr over 60 Minutes Intravenous Every 12 hours 10/15/19 1155 10/16/19 1233   10/15/19 0800  vancomycin (VANCOREADY) IVPB 750 mg/150 mL  Status:  Discontinued        750 mg 150 mL/hr over 60 Minutes Intravenous Every 12 hours 10/14/19 1806 10/15/19 1155   10/14/19 1830  piperacillin-tazobactam (ZOSYN) IVPB 3.375 g  Status:  Discontinued  3.375 g 12.5 mL/hr over 240 Minutes Intravenous Every 8 hours 10/14/19 1806 10/16/19 1234   10/14/19 1800  vancomycin (VANCOREADY) IVPB 1500 mg/300 mL        1,500 mg 150 mL/hr over 120 Minutes Intravenous  Once 10/14/19 1745 10/14/19 2051   10/14/19 1440  vancomycin (VANCOCIN) powder  Status:  Discontinued          As needed 10/14/19 1531 10/14/19 1534   10/14/19 1400  ceFAZolin (ANCEF) IVPB 1 g/50 mL premix  Status:  Discontinued        1 g 100 mL/hr over 30 Minutes Intravenous Every 8 hours 10/14/19 1135 10/14/19 1741   10/14/19 1000  doxycycline (VIBRA-TABS) tablet 100 mg  Status:  Discontinued        100 mg Oral Every 12 hours 10/14/19 0840 10/14/19 1745    10/14/19 0630  piperacillin-tazobactam (ZOSYN) IVPB 3.375 g        3.375 g 100 mL/hr over 30 Minutes Intravenous  Once 10/14/19 5956 10/14/19 0736         Subjective: Patient seen and examined.  No overnight events.  No SNF bed yet.  Objective: Vitals:   10/26/19 1707 10/26/19 2001 10/27/19 0344 10/27/19 0802  BP: 132/66 (!) 143/75 129/69 123/69  Pulse: 68 72 66 68  Resp: 18 16 15 16   Temp: 98.3 F (36.8 C) 97.7 F (36.5 C) 98.6 F (37 C) 98 F (36.7 C)  TempSrc:  Oral Oral Oral  SpO2: 98% 95% 99% 97%  Weight:      Height:        Intake/Output Summary (Last 24 hours) at 10/27/2019 1139 Last data filed at 10/27/2019 0853 Gross per 24 hour  Intake 240 ml  Output 4 ml  Net 236 ml   Filed Weights   10/13/19 2329 10/17/19 1452  Weight: 82.1 kg 82.1 kg    Examination:  General exam: Appears calm and comfortable , on room air. Respiratory system: Clear to auscultation. Respiratory effort normal. Cardiovascular system: S1 & S2 heard,  Gastrointestinal system: soft and non tender. Extremities:  Left forefoot with dressing applied, external fixators present.  Distal neurovascular status intact.  Dressing not removed by me.    Data Reviewed: I have personally reviewed following labs and imaging studies  CBC: Recent Labs  Lab 10/21/19 0356 10/22/19 0325 10/23/19 0426 10/24/19 0440 10/25/19 0357  WBC 8.7 7.8 8.6 6.8 6.7  NEUTROABS 5.8 5.0 5.8 4.2 4.1  HGB 9.6* 9.6* 9.6* 9.5* 9.6*  HCT 30.0* 30.3* 30.5* 30.0* 30.0*  MCV 92.0 91.5 92.7 94.6 93.5  PLT 321 324 298 310 387   Basic Metabolic Panel: Recent Labs  Lab 10/21/19 0356 10/22/19 0325 10/23/19 0426 10/24/19 0440 10/25/19 0357  NA 137 138 137 138 137  K 4.2 4.0 3.9 4.2 4.0  CL 104 104 105 104 104  CO2 27 26 23 26 26   GLUCOSE 105* 105* 99 100* 101*  BUN 11 10 10 12 10   CREATININE 0.72 0.74 0.73 0.69 0.71  CALCIUM 8.8* 9.0 9.0 8.9 8.9  MG 2.0 2.0 1.8 1.8 1.8  PHOS 3.8 4.0 3.8 4.3 3.6    GFR: Estimated Creatinine Clearance: 74 mL/min (by C-G formula based on SCr of 0.71 mg/dL). Liver Function Tests: Recent Labs  Lab 10/21/19 0356 10/22/19 0325 10/23/19 0426 10/24/19 0440 10/25/19 0357  AST 21 38 48* 39 35  ALT 19 30 46* 45* 42  ALKPHOS 58 64 65 63 63  BILITOT 0.2* 0.5 0.4 0.4  0.3  PROT 5.8* 5.9* 5.8* 6.0* 5.7*  ALBUMIN 2.3* 2.4* 2.4* 2.3* 2.5*   No results for input(s): LIPASE, AMYLASE in the last 168 hours. No results for input(s): AMMONIA in the last 168 hours. Coagulation Profile: No results for input(s): INR, PROTIME in the last 168 hours. Cardiac Enzymes: No results for input(s): CKTOTAL, CKMB, CKMBINDEX, TROPONINI in the last 168 hours. BNP (last 3 results) No results for input(s): PROBNP in the last 8760 hours. HbA1C: No results for input(s): HGBA1C in the last 72 hours. CBG: No results for input(s): GLUCAP in the last 168 hours. Lipid Profile: No results for input(s): CHOL, HDL, LDLCALC, TRIG, CHOLHDL, LDLDIRECT in the last 72 hours. Thyroid Function Tests: No results for input(s): TSH, T4TOTAL, FREET4, T3FREE, THYROIDAB in the last 72 hours. Anemia Panel: No results for input(s): VITAMINB12, FOLATE, FERRITIN, TIBC, IRON, RETICCTPCT in the last 72 hours. Sepsis Labs: No results for input(s): PROCALCITON, LATICACIDVEN in the last 168 hours.  No results found for this or any previous visit (from the past 240 hour(s)).       Radiology Studies: No results found.      Scheduled Meds: . vitamin C  500 mg Oral Daily  . aspirin EC  81 mg Oral Daily  . brimonidine  1 drop Both Eyes BID  . Chlorhexidine Gluconate Cloth  6 each Topical Daily  . dorzolamide-timolol  1 drop Both Eyes BID  . enoxaparin (LOVENOX) injection  40 mg Subcutaneous Q24H  . ferrous sulfate  325 mg Oral Q breakfast  . gabapentin  300 mg Oral q morning - 10a   And  . gabapentin  600 mg Oral QHS  . latanoprost  1 drop Both Eyes QHS  . lisinopril  2.5 mg Oral Daily   . methocarbamol  500 mg Oral q1600  . multivitamin with minerals  1 tablet Oral Daily  . omega-3 acid ethyl esters  1 g Oral Daily  . oxyCODONE  10 mg Oral Q12H  . sodium chloride flush  10-40 mL Intracatheter Q12H   Continuous Infusions: . ertapenem 1,000 mg (10/26/19 1523)     LOS: 13 days    Time spent: 25 minutes    Barb Merino, MD [facility] Pager [phone]

## 2019-10-27 NOTE — Plan of Care (Signed)
  Problem: Education: Goal: Knowledge of General Education information will improve Description: Including pain rating scale, medication(s)/side effects and non-pharmacologic comfort measures 10/27/2019 0803 by Rodell Perna, RN Outcome: Progressing 10/27/2019 0803 by Rodell Perna, RN Outcome: Progressing   Problem: Health Behavior/Discharge Planning: Goal: Ability to manage health-related needs will improve Outcome: Progressing

## 2019-10-28 DIAGNOSIS — M71072 Abscess of bursa, left ankle and foot: Secondary | ICD-10-CM | POA: Diagnosis not present

## 2019-10-28 NOTE — Progress Notes (Signed)
PROGRESS NOTE  Alexis Ball  DOB: [birth year]  PCP: Lucianne Lei, MD GGE:[id number]  DOA: 10/13/2019  LOS: 14 days   Chief Complaint  Patient presents with  . Wound Check   Brief narrative: 64 year old with history of hypertension, remote CVA, recent bunionectomy with silicone implant of her left foot on June 25. She presented to the ED on 7/3 with worsening pain and failed outpatient antibiotic therapy. As per report, her wound was open with maggots coming out.   Admitted to the hospital and underwent surgical debridement and external fixation.   She is currently on antibiotics, waiting to go to a skilled nursing facility.  Subjective: Patient was seen and examined this morning.  Middle-aged African-American female.  Not in distress.  No new symptoms.  Assessment/Plan: Left forefoot abscess, cellulitis and osteomyelitis: -7/4, incision drainage by Dr. March Rummage -7/7, debridement and external fixation. Removal of Silicone Implant; Insertion of Antibiotic Spacer; Application of External Fixator;  Application of Wound VAC (Left)  -Wound culture positive for Enterobacter cloacae and Finegoldia Magna, initially treated with multiple antibiotics, now remains on ertapenem until 8/18 through her PICC line as per ID recommendation. -Clinically improving.  Wound VAC in place.  skilled nursing facility for skilled care.  Hypertension: Blood pressure is stable.  Hypokalemia/hypomagnesemia: Improved.  Left foot pain: Pain controlled on opiates and nonopiates.  Mobility: PT/OT evaluation obtained. Code Status:   Code Status: Full Code  Nutritional status: Body mass index is 32.06 kg/m.     Diet Order            Diet regular Room service appropriate? Yes; Fluid consistency: Thin  Diet effective now                 DVT prophylaxis: enoxaparin (LOVENOX) injection 40 mg Start: 10/15/19 0800   Antimicrobials:  Ertapenem IV Fluid: None  Consultants: ID, podiatry Family  Communication:  None at bedside  Status is: Inpatient  Remains inpatient appropriate because:Unsafe d/c plan and IV treatments appropriate due to intensity of illness or inability to take PO   Dispo:  Patient From: Home  Planned Disposition: [facility]  Expected discharge date:   Medically stable for discharge: Yes   Infusions:  . ertapenem 1,000 mg (10/27/19 1406)    Scheduled Meds: . vitamin C  500 mg Oral Daily  . aspirin EC  81 mg Oral Daily  . brimonidine  1 drop Both Eyes BID  . Chlorhexidine Gluconate Cloth  6 each Topical Daily  . dorzolamide-timolol  1 drop Both Eyes BID  . enoxaparin (LOVENOX) injection  40 mg Subcutaneous Q24H  . ferrous sulfate  325 mg Oral Q breakfast  . gabapentin  300 mg Oral q morning - 10a   And  . gabapentin  600 mg Oral QHS  . latanoprost  1 drop Both Eyes QHS  . lisinopril  2.5 mg Oral Daily  . methocarbamol  500 mg Oral q1600  . multivitamin with minerals  1 tablet Oral Daily  . omega-3 acid ethyl esters  1 g Oral Daily  . oxyCODONE  10 mg Oral Q12H  . sodium chloride flush  10-40 mL Intracatheter Q12H    Antimicrobials: Anti-infectives (From admission, onward)   Start     Dose/Rate Route Frequency Ordered Stop   10/18/19 1400  ertapenem (INVANZ) 1,000 mg in sodium chloride 0.9 % 100 mL IVPB     Discontinue     1 g 200 mL/hr over 30 Minutes Intravenous Every 24 hours  10/18/19 0909     10/17/19 1739  vancomycin (VANCOCIN) powder  Status:  Discontinued          As needed 10/17/19 1740 10/17/19 1820   10/16/19 1400  ceFEPIme (MAXIPIME) 2 g in sodium chloride 0.9 % 100 mL IVPB  Status:  Discontinued        2 g 200 mL/hr over 30 Minutes Intravenous Every 8 hours 10/16/19 1233 10/18/19 0909   10/15/19 2100  vancomycin (VANCOCIN) IVPB 1000 mg/200 mL premix  Status:  Discontinued        1,000 mg 200 mL/hr over 60 Minutes Intravenous Every 12 hours 10/15/19 1155 10/16/19 1233   10/15/19 0800  vancomycin (VANCOREADY) IVPB  750 mg/150 mL  Status:  Discontinued        750 mg 150 mL/hr over 60 Minutes Intravenous Every 12 hours 10/14/19 1806 10/15/19 1155   10/14/19 1830  piperacillin-tazobactam (ZOSYN) IVPB 3.375 g  Status:  Discontinued        3.375 g 12.5 mL/hr over 240 Minutes Intravenous Every 8 hours 10/14/19 1806 10/16/19 1234   10/14/19 1800  vancomycin (VANCOREADY) IVPB 1500 mg/300 mL        1,500 mg 150 mL/hr over 120 Minutes Intravenous  Once 10/14/19 1745 10/14/19 2051   10/14/19 1440  vancomycin (VANCOCIN) powder  Status:  Discontinued          As needed 10/14/19 1531 10/14/19 1534   10/14/19 1400  ceFAZolin (ANCEF) IVPB 1 g/50 mL premix  Status:  Discontinued        1 g 100 mL/hr over 30 Minutes Intravenous Every 8 hours 10/14/19 1135 10/14/19 1741   10/14/19 1000  doxycycline (VIBRA-TABS) tablet 100 mg  Status:  Discontinued        100 mg Oral Every 12 hours 10/14/19 0840 10/14/19 1745   10/14/19 0630  piperacillin-tazobactam (ZOSYN) IVPB 3.375 g        3.375 g 100 mL/hr over 30 Minutes Intravenous  Once 10/14/19 0623 10/14/19 0736      PRN meds: HYDROcodone-acetaminophen, ibuprofen, lidocaine, ondansetron, polyethylene glycol, sodium chloride flush   Objective: Vitals:   10/28/19 0340 10/28/19 0826  BP: 132/75 (!) 153/60  Pulse: 68 69  Resp: 16 17  Temp: 98.9 F (37.2 C) 98.8 F (37.1 C)  SpO2: 97% 100%    Intake/Output Summary (Last 24 hours) at 10/28/2019 1436 Last data filed at 10/28/2019 0900 Gross per 24 hour  Intake 480 ml  Output --  Net 480 ml   Filed Weights   10/13/19 2329 10/17/19 1452  Weight: 82.1 kg 82.1 kg   Weight change:  Body mass index is 32.06 kg/m.   Physical Exam: General exam: Appears calm and comfortable.  Not in physical distress Skin: No rashes, lesions or ulcers. HEENT: Atraumatic, normocephalic, supple neck, no obvious bleeding Lungs: Clear to auscultation bilaterally CVS: Regular rate and rhythm, no murmur GI/Abd soft, nontender,  nondistended, bowel sound present CNS: Alert, awake, oriented x3 Psychiatry: Mood appropriate Extremities: No pedal edema, no calf tenderness.  Left foot has external fixator and dressing on.  Distal neurovascular status intact.  Data Review: I have personally reviewed the laboratory data and studies available.  Recent Labs  Lab 10/22/19 0325 10/23/19 0426 10/24/19 0440 10/25/19 0357  WBC 7.8 8.6 6.8 6.7  NEUTROABS 5.0 5.8 4.2 4.1  HGB 9.6* 9.6* 9.5* 9.6*  HCT 30.3* 30.5* 30.0* 30.0*  MCV 91.5 92.7 94.6 93.5  PLT 324 298 310 311   Recent  Labs  Lab 10/22/19 0325 10/23/19 0426 10/24/19 0440 10/25/19 0357  NA 138 137 138 137  K 4.0 3.9 4.2 4.0  CL 104 105 104 104  CO2 26 23 26 26   GLUCOSE 105* 99 100* 101*  BUN 10 10 12 10   CREATININE 0.74 0.73 0.69 0.71  CALCIUM 9.0 9.0 8.9 8.9  MG 2.0 1.8 1.8 1.8  PHOS 4.0 3.8 4.3 3.6    Signed, Terrilee Croak, MD [facility] Pager: [phone] (Secure Chat preferred). 10/28/2019

## 2019-10-29 DIAGNOSIS — M71072 Abscess of bursa, left ankle and foot: Secondary | ICD-10-CM | POA: Diagnosis not present

## 2019-10-29 NOTE — Progress Notes (Addendum)
Physical Therapy Treatment Patient Details Name: Alexis Ball MRN: [id number] DOB: 24-Jul-1955 Today's Date: 10/29/2019    History of Present Illness Pt is a 65 yo female presenting s/p surgical drainage of L foot infection 7/4 with bone biopsy due to concern for possible osteomyelitis after bunionectomy with silicone implant on 8/29. Further examination reports osteomyelitis with infection of hardware.  Pt s/p I&D with hard ware removal/silicon implant removal and now ex fix and antibiotic spacer placed.  Surgeon also placed wound vac after procedure.   PMH includes: HTN, and stroke.    PT Comments    Pt supine in bed on arrival.  She reports she has had a rough day with wound vac changes and is in more pain with weight bearing.  Focused on LE strengthening bilaterally.  Continue to recommend HHPT at d/c. Plan for OOB activity next session.    Follow Up Recommendations  Home health PT;Supervision/Assistance - 24 hour     Equipment Recommendations  None recommended by PT    Recommendations for Other Services       Precautions / Restrictions Precautions Precautions: Fall Required Braces or Orthoses: Other Brace Other Brace: CAM boot LLE Restrictions Weight Bearing Restrictions: Yes LLE Weight Bearing: Weight bearing as tolerated LLE Partial Weight Bearing Percentage or Pounds: through heel    Mobility  Bed Mobility               General bed mobility comments: pt refused OOB reporting increased discomfort from vac change.  Focused on supine LE exercises.  Transfers                    Ambulation/Gait                 Stairs             Wheelchair Mobility    Modified Rankin (Stroke Patients Only)       Balance                                            Cognition Arousal/Alertness: Awake/alert Behavior During Therapy: WFL for tasks assessed/performed Overall Cognitive Status: Within Functional Limits for tasks  assessed                                        Exercises General Exercises - Lower Extremity Ankle Circles/Pumps: AROM;Right;20 reps;Supine Quad Sets: AROM;Both;10 reps;Supine Heel Slides: AROM;Both;10 reps;Supine Hip ABduction/ADduction: AROM;Both;10 reps;Supine Straight Leg Raises: AROM;Both;10 reps;Supine;Limitations Straight Leg Raises Limitations: extensor lag noted on L side.    General Comments        Pertinent Vitals/Pain Pain Assessment: 0-10 Pain Score: 7  Pain Location: surgical site after multiple wound vac changes. Pain Descriptors / Indicators: Sore;Discomfort Pain Intervention(s): Limited activity within patient's tolerance    Home Living                      Prior Function            PT Goals (current goals can now be found in the care plan section) Acute Rehab PT Goals Patient Stated Goal: return home Potential to Achieve Goals: Good Progress towards PT goals: Progressing toward goals    Frequency    Min 3X/week  PT Plan Current plan remains appropriate    Co-evaluation              AM-PAC PT "6 Clicks" Mobility   Outcome Measure  Help needed turning from your back to your side while in a flat bed without using bedrails?: None Help needed moving from lying on your back to sitting on the side of a flat bed without using bedrails?: None Help needed moving to and from a bed to a chair (including a wheelchair)?: A Little Help needed standing up from a chair using your arms (e.g., wheelchair or bedside chair)?: A Little Help needed to walk in hospital room?: A Little Help needed climbing 3-5 steps with a railing? : A Little 6 Click Score: 20    End of Session Equipment Utilized During Treatment: Gait belt Activity Tolerance: Patient tolerated treatment well Patient left: in bed;with call bell/phone within reach Nurse Communication: Mobility status PT Visit Diagnosis: Other abnormalities of gait and  mobility (R26.89);Muscle weakness (generalized) (M62.81);Pain Pain - Right/Left: Left Pain - part of body: Ankle and joints of foot     Time: 1733-1749 PT Time Calculation (min) (ACUTE ONLY): 16 min  Charges:  $Therapeutic Exercise: 8-22 mins                     Alexis Ball , PTA Acute Rehabilitation Services Pager [phone] Office [phone]     Alexis Ball 10/29/2019, 6:09 PM

## 2019-10-29 NOTE — Progress Notes (Signed)
PROGRESS NOTE  Alexis Ball  DOB: Apr 30, 1955  PCP: Lucianne Lei, MD SAY:[id number]  DOA: 10/13/2019  LOS: 15 days   Chief Complaint  Patient presents with  . Wound Check   Brief narrative: 64 year old with history of hypertension, remote CVA, recent bunionectomy with silicone implant of her left foot on June 25. She presented to the ED on 7/3 with worsening pain and failed outpatient antibiotic therapy. As per report, her wound was open with maggots coming out.   Admitted to the hospital and underwent surgical debridement and external fixation.   She is currently on antibiotics, waiting to go to a skilled nursing facility.  Subjective: Patient was seen and examined this morning.  Middle-aged African-American female.  Not in distress.  No new symptoms. At wound dressing done this morning. Waiting for placement.    Assessment/Plan: Left forefoot abscess, cellulitis and osteomyelitis: -7/4, incision drainage by Dr. March Rummage -7/7, debridement and external fixation. Removal of Silicone Implant; Insertion of Antibiotic Spacer; Application of External Fixator;  Application of Wound VAC (Left)  -Wound culture positive for Enterobacter cloacae and Finegoldia Magna, initially treated with multiple antibiotics, now remains on ertapenem until 8/18 through her PICC line as per ID recommendation. -Clinically improving.  Wound VAC in place.  Underwent wound dressing this morning.  Skilled nursing facility for skilled care.  Hypertension: Blood pressure is stable.  Hypokalemia/hypomagnesemia: Improved.  Obtain labs tomorrow.  Left foot pain: Pain controlled on opiates and nonopiates.  Mobility: PT/OT evaluation obtained.  SNF recommended Code Status:   Code Status: Full Code  Nutritional status: Body mass index is 32.06 kg/m.     Diet Order            Diet regular Room service appropriate? Yes; Fluid consistency: Thin  Diet effective now                 DVT  prophylaxis: enoxaparin (LOVENOX) injection 40 mg Start: 10/15/19 0800   Antimicrobials:  Ertapenem IV planned till 8/18 Fluid: None  Consultants: ID, podiatry Family Communication:  None at bedside  Status is: Inpatient  Remains inpatient appropriate because:Unsafe d/c plan and IV treatments appropriate due to intensity of illness or inability to take PO   Dispo:  Patient From: Home  Planned Disposition: [place]  Expected discharge date:   Medically stable for discharge: Yes   Infusions:  . ertapenem 1,000 mg (10/28/19 1440)    Scheduled Meds: . vitamin C  500 mg Oral Daily  . aspirin EC  81 mg Oral Daily  . brimonidine  1 drop Both Eyes BID  . Chlorhexidine Gluconate Cloth  6 each Topical Daily  . dorzolamide-timolol  1 drop Both Eyes BID  . enoxaparin (LOVENOX) injection  40 mg Subcutaneous Q24H  . ferrous sulfate  325 mg Oral Q breakfast  . gabapentin  300 mg Oral q morning - 10a   And  . gabapentin  600 mg Oral QHS  . latanoprost  1 drop Both Eyes QHS  . lisinopril  2.5 mg Oral Daily  . methocarbamol  500 mg Oral q1600  . multivitamin with minerals  1 tablet Oral Daily  . omega-3 acid ethyl esters  1 g Oral Daily  . oxyCODONE  10 mg Oral Q12H  . sodium chloride flush  10-40 mL Intracatheter Q12H    Antimicrobials: Anti-infectives (From admission, onward)   Start     Dose/Rate Route Frequency Ordered Stop   10/18/19 1400  ertapenem (INVANZ) 1,000 mg in  sodium chloride 0.9 % 100 mL IVPB     Discontinue     1 g 200 mL/hr over 30 Minutes Intravenous Every 24 hours 10/18/19 0909     10/17/19 1739  vancomycin (VANCOCIN) powder  Status:  Discontinued          As needed 10/17/19 1740 10/17/19 1820   10/16/19 1400  ceFEPIme (MAXIPIME) 2 g in sodium chloride 0.9 % 100 mL IVPB  Status:  Discontinued        2 g 200 mL/hr over 30 Minutes Intravenous Every 8 hours 10/16/19 1233 10/18/19 0909   10/15/19 2100  vancomycin (VANCOCIN) IVPB 1000 mg/200 mL  premix  Status:  Discontinued        1,000 mg 200 mL/hr over 60 Minutes Intravenous Every 12 hours 10/15/19 1155 10/16/19 1233   10/15/19 0800  vancomycin (VANCOREADY) IVPB 750 mg/150 mL  Status:  Discontinued        750 mg 150 mL/hr over 60 Minutes Intravenous Every 12 hours 10/14/19 1806 10/15/19 1155   10/14/19 1830  piperacillin-tazobactam (ZOSYN) IVPB 3.375 g  Status:  Discontinued        3.375 g 12.5 mL/hr over 240 Minutes Intravenous Every 8 hours 10/14/19 1806 10/16/19 1234   10/14/19 1800  vancomycin (VANCOREADY) IVPB 1500 mg/300 mL        1,500 mg 150 mL/hr over 120 Minutes Intravenous  Once 10/14/19 1745 10/14/19 2051   10/14/19 1440  vancomycin (VANCOCIN) powder  Status:  Discontinued          As needed 10/14/19 1531 10/14/19 1534   10/14/19 1400  ceFAZolin (ANCEF) IVPB 1 g/50 mL premix  Status:  Discontinued        1 g 100 mL/hr over 30 Minutes Intravenous Every 8 hours 10/14/19 1135 10/14/19 1741   10/14/19 1000  doxycycline (VIBRA-TABS) tablet 100 mg  Status:  Discontinued        100 mg Oral Every 12 hours 10/14/19 0840 10/14/19 1745   10/14/19 0630  piperacillin-tazobactam (ZOSYN) IVPB 3.375 g        3.375 g 100 mL/hr over 30 Minutes Intravenous  Once 10/14/19 0623 10/14/19 0736      PRN meds: HYDROcodone-acetaminophen, ibuprofen, lidocaine, ondansetron, polyethylene glycol, sodium chloride flush   Objective: Vitals:   10/28/19 2000 10/29/19 0529  BP: 137/78 (!) 141/72  Pulse: (!) 56 70  Resp: 17 15  Temp: 98.6 F (37 C) 98.4 F (36.9 C)  SpO2: 97% 97%    Intake/Output Summary (Last 24 hours) at 10/29/2019 1118 Last data filed at 10/28/2019 1800 Gross per 24 hour  Intake 555 ml  Output --  Net 555 ml   Filed Weights   10/13/19 2329 10/17/19 1452  Weight: 82.1 kg 82.1 kg   Weight change:  Body mass index is 32.06 kg/m.   Physical Exam: General exam: Appears calm and comfortable.  Not in physical distress Skin: No rashes, lesions or ulcers. HEENT:  Atraumatic, normocephalic, supple neck, no obvious bleeding Lungs: Clear to auscultation bilaterally CVS: Regular rate and rhythm, no murmur GI/Abd soft, nontender, nondistended, bowel sound present CNS: Alert, awake, oriented x3 Psychiatry: Mood appropriate Extremities: No pedal edema, no calf tenderness.  Left foot has external fixator and dressing in place.  Distal neurovascular status intact.  Data Review: I have personally reviewed the laboratory data and studies available.  Recent Labs  Lab 10/23/19 0426 10/24/19 0440 10/25/19 0357  WBC 8.6 6.8 6.7  NEUTROABS 5.8 4.2 4.1  HGB  9.6* 9.5* 9.6*  HCT 30.5* 30.0* 30.0*  MCV 92.7 94.6 93.5  PLT 298 310 311   Recent Labs  Lab 10/23/19 0426 10/24/19 0440 10/25/19 0357  NA 137 138 137  K 3.9 4.2 4.0  CL 105 104 104  CO2 23 26 26   GLUCOSE 99 100* 101*  BUN 10 12 10   CREATININE 0.73 0.69 0.71  CALCIUM 9.0 8.9 8.9  MG 1.8 1.8 1.8  PHOS 3.8 4.3 3.6    Signed, Terrilee Croak, MD [facility] Pager: [phone] (Secure Chat preferred). 10/29/2019

## 2019-10-29 NOTE — Consult Note (Signed)
[place] Nurse wound follow up NPWT VAC dressing change to right dorsal foot.  Debridement of wound infection with external fixator in place.  Wound type:surgical, infectious Measurement: 2 open areas.  Right foot including great toe, dorsal aspect:  5 cm x 1 cm x 0.3 cm with visible yellow, moist tendon.  Right dorsal foot, beneath second and third metatarsal 4 cm x 1 cm x 0.2 cm  Wound bed: ruddy red with visible tendon Drainage (amount, consistency, odor) Minimal serosanguinous. No purulence or larvae noted.  Periwound:External fixators present Dressing procedure/placement/frequency: Cleansed with NS.  Mepitel silicone layer to wound bed.  2 pieces black foam.  Barrier ring to periwound.  Seal achieved after waterproof tape secured at toe junction to promote seal. Change mon/W/F WOC team will follow.  Domenic Moras MSN, RN, FNP-BC CWON Wound, Ostomy, Continence Nurse Pager [phone]

## 2019-10-30 DIAGNOSIS — L02612 Cutaneous abscess of left foot: Secondary | ICD-10-CM | POA: Diagnosis not present

## 2019-10-30 DIAGNOSIS — M71072 Abscess of bursa, left ankle and foot: Secondary | ICD-10-CM | POA: Diagnosis not present

## 2019-10-30 LAB — CBC WITH DIFFERENTIAL/PLATELET
Abs Immature Granulocytes: 0.03 10*3/uL (ref 0.00–0.07)
Basophils Absolute: 0.1 10*3/uL (ref 0.0–0.1)
Basophils Relative: 1 %
Eosinophils Absolute: 0.2 10*3/uL (ref 0.0–0.5)
Eosinophils Relative: 4 %
HCT: 30.3 % — ABNORMAL LOW (ref 36.0–46.0)
Hemoglobin: 9.6 g/dL — ABNORMAL LOW (ref 12.0–15.0)
Immature Granulocytes: 1 %
Lymphocytes Relative: 29 %
Lymphs Abs: 1.8 10*3/uL (ref 0.7–4.0)
MCH: 29.4 pg (ref 26.0–34.0)
MCHC: 31.7 g/dL (ref 30.0–36.0)
MCV: 92.9 fL (ref 80.0–100.0)
Monocytes Absolute: 0.6 10*3/uL (ref 0.1–1.0)
Monocytes Relative: 10 %
Neutro Abs: 3.3 10*3/uL (ref 1.7–7.7)
Neutrophils Relative %: 55 %
Platelets: 309 10*3/uL (ref 150–400)
RBC: 3.26 MIL/uL — ABNORMAL LOW (ref 3.87–5.11)
RDW: 13.2 % (ref 11.5–15.5)
WBC: 6.1 10*3/uL (ref 4.0–10.5)
nRBC: 0 % (ref 0.0–0.2)

## 2019-10-30 LAB — BASIC METABOLIC PANEL
Anion gap: 8 (ref 5–15)
BUN: 11 mg/dL (ref 8–23)
CO2: 23 mmol/L (ref 22–32)
Calcium: 8.8 mg/dL — ABNORMAL LOW (ref 8.9–10.3)
Chloride: 108 mmol/L (ref 98–111)
Creatinine, Ser: 0.69 mg/dL (ref 0.44–1.00)
GFR calc Af Amer: >60 mL/min (ref >60)
GFR calc non Af Amer: >60 mL/min (ref >60)
Glucose, Bld: 100 mg/dL — ABNORMAL HIGH (ref 70–99)
Potassium: 4 mmol/L (ref 3.5–5.1)
Sodium: 139 mmol/L (ref 135–145)

## 2019-10-30 LAB — MAGNESIUM: Magnesium: 1.7 mg/dL (ref 1.7–2.4)

## 2019-10-30 LAB — PHOSPHORUS: Phosphorus: 3.6 mg/dL (ref 2.5–4.6)

## 2019-10-30 MED ORDER — MAGNESIUM OXIDE 400 (241.3 MG) MG PO TABS
400.0000 mg | ORAL_TABLET | Freq: Two times a day (BID) | ORAL | Status: AC
  Administered 2019-10-30 (×2): 400 mg via ORAL
  Filled 2019-10-30 (×2): qty 1

## 2019-10-30 NOTE — Progress Notes (Signed)
PROGRESS NOTE  Alexis Ball  DOB: 1955/04/15  PCP: Lucianne Lei, MD YKD:[id number]  DOA: 10/13/2019  LOS: 16 days   Chief Complaint  Patient presents with   Wound Check   Brief narrative: 64 year old with history of hypertension, remote CVA, recent bunionectomy with silicone implant of her left foot on June 25. She presented to the ED on 7/3 with worsening pain and failed outpatient antibiotic therapy. As per report, her wound was open with maggots coming out.   Admitted to the hospital and underwent surgical debridement and external fixation.   She is currently on antibiotics, waiting to go to a skilled nursing facility.  Subjective: Patient was seen and examined this morning.  Not in distress.  No new symptoms.  Labs this morning stable.  Assessment/Plan: Left forefoot abscess, cellulitis and osteomyelitis: -7/4, incision drainage by Dr. March Rummage -7/7, debridement and external fixation. Removal of Silicone Implant; Insertion of Antibiotic Spacer; Application of External Fixator;  Application of Wound VAC (Left)  -Wound culture positive for Enterobacter cloacae and Finegoldia Magna, initially treated with multiple antibiotics, now remains on ertapenem until 8/18 through her PICC line as per ID recommendation. -Clinically improving.  Wound VAC in place.  Underwent wound dressing this morning.  Skilled nursing facility for skilled care.  Hypertension: Blood pressure is stable.  Hypokalemia/hypomagnesemia: Improved.  Labs this morning with potassium of 4, magnesium slightly low at 1.7.  I will order for 400mg  of oral magnesium x2.  Left foot pain: Pain controlled on opiates and nonopiates.  Mobility: PT/OT evaluation obtained. SNF recommended Code Status:   Code Status: Full Code  Nutritional status: Body mass index is 32.06 kg/m.     Diet Order            Diet regular Room service appropriate? Yes; Fluid consistency: Thin  Diet effective now                 DVT  prophylaxis: enoxaparin (LOVENOX) injection 40 mg Start: 10/15/19 0800   Antimicrobials:  Ertapenem IV planned till 8/18 Fluid: None  Consultants: ID, podiatry. Family Communication:  None at bedside  Status is: Inpatient  Remains inpatient appropriate because:Unsafe d/c plan and IV treatments appropriate due to intensity of illness or inability to take PO  Dispo:  Patient From: Home  Planned Disposition: [place]  Expected discharge date:   Medically stable for discharge: Yes  Infusions:   ertapenem 1,000 mg (10/29/19 1419)    Scheduled Meds:  vitamin C  500 mg Oral Daily   aspirin EC  81 mg Oral Daily   brimonidine  1 drop Both Eyes BID   Chlorhexidine Gluconate Cloth  6 each Topical Daily   dorzolamide-timolol  1 drop Both Eyes BID   enoxaparin (LOVENOX) injection  40 mg Subcutaneous Q24H   ferrous sulfate  325 mg Oral Q breakfast   gabapentin  300 mg Oral q morning - 10a   And   gabapentin  600 mg Oral QHS   latanoprost  1 drop Both Eyes QHS   lisinopril  2.5 mg Oral Daily   magnesium oxide  400 mg Oral BID   methocarbamol  500 mg Oral q1600   multivitamin with minerals  1 tablet Oral Daily   omega-3 acid ethyl esters  1 g Oral Daily   oxyCODONE  10 mg Oral Q12H   sodium chloride flush  10-40 mL Intracatheter Q12H    Antimicrobials: Anti-infectives (From admission, onward)   Start  Dose/Rate Route Frequency Ordered Stop   10/18/19 1400  ertapenem (INVANZ) 1,000 mg in sodium chloride 0.9 % 100 mL IVPB     Discontinue     1 g 200 mL/hr over 30 Minutes Intravenous Every 24 hours 10/18/19 0909     10/17/19 1739  vancomycin (VANCOCIN) powder  Status:  Discontinued          As needed 10/17/19 1740 10/17/19 1820   10/16/19 1400  ceFEPIme (MAXIPIME) 2 g in sodium chloride 0.9 % 100 mL IVPB  Status:  Discontinued        2 g 200 mL/hr over 30 Minutes Intravenous Every 8 hours 10/16/19 1233 10/18/19 0909   10/15/19 2100  vancomycin  (VANCOCIN) IVPB 1000 mg/200 mL premix  Status:  Discontinued        1,000 mg 200 mL/hr over 60 Minutes Intravenous Every 12 hours 10/15/19 1155 10/16/19 1233   10/15/19 0800  vancomycin (VANCOREADY) IVPB 750 mg/150 mL  Status:  Discontinued        750 mg 150 mL/hr over 60 Minutes Intravenous Every 12 hours 10/14/19 1806 10/15/19 1155   10/14/19 1830  piperacillin-tazobactam (ZOSYN) IVPB 3.375 g  Status:  Discontinued        3.375 g 12.5 mL/hr over 240 Minutes Intravenous Every 8 hours 10/14/19 1806 10/16/19 1234   10/14/19 1800  vancomycin (VANCOREADY) IVPB 1500 mg/300 mL        1,500 mg 150 mL/hr over 120 Minutes Intravenous  Once 10/14/19 1745 10/14/19 2051   10/14/19 1440  vancomycin (VANCOCIN) powder  Status:  Discontinued          As needed 10/14/19 1531 10/14/19 1534   10/14/19 1400  ceFAZolin (ANCEF) IVPB 1 g/50 mL premix  Status:  Discontinued        1 g 100 mL/hr over 30 Minutes Intravenous Every 8 hours 10/14/19 1135 10/14/19 1741   10/14/19 1000  doxycycline (VIBRA-TABS) tablet 100 mg  Status:  Discontinued        100 mg Oral Every 12 hours 10/14/19 0840 10/14/19 1745   10/14/19 0630  piperacillin-tazobactam (ZOSYN) IVPB 3.375 g        3.375 g 100 mL/hr over 30 Minutes Intravenous  Once 10/14/19 0623 10/14/19 0736      PRN meds: HYDROcodone-acetaminophen, ibuprofen, lidocaine, ondansetron, polyethylene glycol, sodium chloride flush   Objective: Vitals:   10/30/19 0449 10/30/19 0749  BP: 139/63 129/69  Pulse: (!) 58 66  Resp: 17 18  Temp: 98.4 F (36.9 C) 98.1 F (36.7 C)  SpO2: 100% 100%   No intake or output data in the 24 hours ending 10/30/19 1050 Filed Weights   10/13/19 2329 10/17/19 1452  Weight: 82.1 kg 82.1 kg   Weight change:  Body mass index is 32.06 kg/m.   Physical Exam: General exam: Appears calm and comfortable.  Not in physical distress Skin: No rashes, lesions or ulcers. HEENT: Atraumatic, normocephalic, supple neck, no obvious  bleeding Lungs: Clear to auscultation bilaterally CVS: Regular rate and rhythm, no murmur GI/Abd soft, nontender, nondistended, bowel sound present CNS: Alert, awake, oriented x3 Psychiatry: Mood appropriate Extremities: No pedal edema, no calf tenderness.  Left foot has external fixator and dressing in place.  Distal neurovascular status intact.  Data Review: I have personally reviewed the laboratory data and studies available.  Recent Labs  Lab 10/24/19 0440 10/25/19 0357 10/30/19 0406  WBC 6.8 6.7 6.1  NEUTROABS 4.2 4.1 3.3  HGB 9.5* 9.6* 9.6*  HCT 30.0*  30.0* 30.3*  MCV 94.6 93.5 92.9  PLT 310 311 309   Recent Labs  Lab 10/24/19 0440 10/25/19 0357 10/30/19 0406  NA 138 137 139  K 4.2 4.0 4.0  CL 104 104 108  CO2 26 26 23   GLUCOSE 100* 101* 100*  BUN 12 10 11   CREATININE 0.69 0.71 0.69  CALCIUM 8.9 8.9 8.8*  MG 1.8 1.8 1.7  PHOS 4.3 3.6 3.6    Signed, Terrilee Croak, MD [facility] Pager: [phone] (Secure Chat preferred). 10/30/2019

## 2019-10-30 NOTE — Plan of Care (Signed)
  Problem: Health Behavior/Discharge Planning: Goal: Ability to manage health-related needs will improve Outcome: Progressing   Problem: Clinical Measurements: Goal: Ability to maintain clinical measurements within normal limits will improve Outcome: Progressing   Problem: Activity: Goal: Risk for activity intolerance will decrease Outcome: Progressing   Problem: Nutrition: Goal: Adequate nutrition will be maintained Outcome: Progressing   Problem: Coping: Goal: Level of anxiety will decrease Outcome: Progressing   Problem: Elimination: Goal: Will not experience complications related to urinary retention Outcome: Progressing   Problem: Pain Managment: Goal: General experience of comfort will improve Outcome: Progressing   Problem: Safety: Goal: Ability to remain free from injury will improve Outcome: Progressing   Problem: Skin Integrity: Goal: Risk for impaired skin integrity will decrease Outcome: Progressing

## 2019-10-31 ENCOUNTER — Inpatient Hospital Stay (HOSPITAL_COMMUNITY): Payer: Medicare HMO

## 2019-10-31 DIAGNOSIS — R609 Edema, unspecified: Secondary | ICD-10-CM | POA: Diagnosis not present

## 2019-10-31 DIAGNOSIS — M71072 Abscess of bursa, left ankle and foot: Secondary | ICD-10-CM | POA: Diagnosis not present

## 2019-10-31 NOTE — Progress Notes (Signed)
PROGRESS NOTE  Alexis Ball  DOB: 1955/09/28  PCP: Lucianne Lei, MD NLZ:[id number]  DOA: 10/13/2019  LOS: 23 days   Chief Complaint  Patient presents with   Wound Check   Brief narrative: 64 year old with history of hypertension, remote CVA, recent bunionectomy with silicone implant of her left foot on June 25. She presented to the ED on 7/3 with worsening pain and failed outpatient antibiotic therapy. As per report, her wound was open with maggots coming out.   Admitted to the hospital and underwent surgical debridement and external fixation.   She is currently on antibiotics, waiting to go to a skilled nursing facility.  Subjective: Patient was seen and examined this morning.  Not in distress. Patient is complaining of swelling and pain of her right shoulder at the site of PICC line.  Assessment/Plan: Left forefoot abscess, cellulitis and osteomyelitis: -7/4, incision and drainage by Dr. March Rummage -7/7, debridement and external fixation. Removal of Silicone Implant; Insertion of Antibiotic Spacer; Application of External Fixator;  Application of Wound VAC (Left)  -Wound culture positive for Enterobacter cloacae and Finegoldia Magna, initially treated with multiple antibiotics, now remains on ertapenem until 8/18 through her PICC line as per ID recommendation. -Clinically improving.  Wound VAC in place.  Underwent wound dressing this morning.  Skilled nursing facility for skilled care.  Right arm swelling -At the site of PICC line.  Rule out DVT.  Ultrasound duplex pending.  Hypertension: Blood pressure is stable.  Hypokalemia/hypomagnesemia: Improved.  Last blood work from 7/20.  Replacement given.  Left foot pain: Pain controlled on opiates and nonopiates.  Mobility: PT/OT evaluation obtained. SNF recommended Code Status:   Code Status: Full Code  Nutritional status: Body mass index is 32.06 kg/m.     Diet Order            Diet regular Room service appropriate?  Yes; Fluid consistency: Thin  Diet effective now                 DVT prophylaxis: enoxaparin (LOVENOX) injection 40 mg Start: 10/15/19 0800   Antimicrobials:  Ertapenem IV planned till 8/18 Fluid: None  Consultants: ID, podiatry. Family Communication:  None at bedside  Status is: Inpatient  Remains inpatient appropriate because:Unsafe d/c plan and IV treatments appropriate due to intensity of illness or inability to take PO  Dispo:  Patient From: Home  Planned Disposition: [place]  Expected discharge date: 7/22  Medically stable for discharge:  None today.  Ultrasound duplexof upper extremity pending  Infusions:   ertapenem Stopped (10/30/19 1402)    Scheduled Meds:  vitamin C  500 mg Oral Daily   aspirin EC  81 mg Oral Daily   brimonidine  1 drop Both Eyes BID   Chlorhexidine Gluconate Cloth  6 each Topical Daily   dorzolamide-timolol  1 drop Both Eyes BID   enoxaparin (LOVENOX) injection  40 mg Subcutaneous Q24H   ferrous sulfate  325 mg Oral Q breakfast   gabapentin  300 mg Oral q morning - 10a   And   gabapentin  600 mg Oral QHS   latanoprost  1 drop Both Eyes QHS   lisinopril  2.5 mg Oral Daily   methocarbamol  500 mg Oral q1600   multivitamin with minerals  1 tablet Oral Daily   omega-3 acid ethyl esters  1 g Oral Daily   oxyCODONE  10 mg Oral Q12H   sodium chloride flush  10-40 mL Intracatheter Q12H    Antimicrobials:  Anti-infectives (From admission, onward)   Start     Dose/Rate Route Frequency Ordered Stop   10/18/19 1400  ertapenem (INVANZ) 1,000 mg in sodium chloride 0.9 % 100 mL IVPB     Discontinue     1 g 200 mL/hr over 30 Minutes Intravenous Every 24 hours 10/18/19 0909     10/17/19 1739  vancomycin (VANCOCIN) powder  Status:  Discontinued          As needed 10/17/19 1740 10/17/19 1820   10/16/19 1400  ceFEPIme (MAXIPIME) 2 g in sodium chloride 0.9 % 100 mL IVPB  Status:  Discontinued        2 g 200 mL/hr  over 30 Minutes Intravenous Every 8 hours 10/16/19 1233 10/18/19 0909   10/15/19 2100  vancomycin (VANCOCIN) IVPB 1000 mg/200 mL premix  Status:  Discontinued        1,000 mg 200 mL/hr over 60 Minutes Intravenous Every 12 hours 10/15/19 1155 10/16/19 1233   10/15/19 0800  vancomycin (VANCOREADY) IVPB 750 mg/150 mL  Status:  Discontinued        750 mg 150 mL/hr over 60 Minutes Intravenous Every 12 hours 10/14/19 1806 10/15/19 1155   10/14/19 1830  piperacillin-tazobactam (ZOSYN) IVPB 3.375 g  Status:  Discontinued        3.375 g 12.5 mL/hr over 240 Minutes Intravenous Every 8 hours 10/14/19 1806 10/16/19 1234   10/14/19 1800  vancomycin (VANCOREADY) IVPB 1500 mg/300 mL        1,500 mg 150 mL/hr over 120 Minutes Intravenous  Once 10/14/19 1745 10/14/19 2051   10/14/19 1440  vancomycin (VANCOCIN) powder  Status:  Discontinued          As needed 10/14/19 1531 10/14/19 1534   10/14/19 1400  ceFAZolin (ANCEF) IVPB 1 g/50 mL premix  Status:  Discontinued        1 g 100 mL/hr over 30 Minutes Intravenous Every 8 hours 10/14/19 1135 10/14/19 1741   10/14/19 1000  doxycycline (VIBRA-TABS) tablet 100 mg  Status:  Discontinued        100 mg Oral Every 12 hours 10/14/19 0840 10/14/19 1745   10/14/19 0630  piperacillin-tazobactam (ZOSYN) IVPB 3.375 g        3.375 g 100 mL/hr over 30 Minutes Intravenous  Once 10/14/19 0623 10/14/19 0736      PRN meds: HYDROcodone-acetaminophen, ibuprofen, lidocaine, ondansetron, polyethylene glycol, sodium chloride flush   Objective: Vitals:   10/31/19 0406 10/31/19 0801  BP: 128/77 122/66  Pulse: 62 71  Resp: 16 17  Temp: 98.2 F (36.8 C) 98.2 F (36.8 C)  SpO2: 100% 98%    Intake/Output Summary (Last 24 hours) at 10/31/2019 1337 Last data filed at 10/31/2019 1200 Gross per 24 hour  Intake 460 ml  Output 0 ml  Net 460 ml   Filed Weights   10/13/19 2329 10/17/19 1452  Weight: 82.1 kg 82.1 kg   Weight change:  Body mass index is 32.06 kg/m.    Physical Exam: General exam: Appears calm and comfortable.  Not in physical distress Skin: No rashes, lesions or ulcers. HEENT: Atraumatic, normocephalic, supple neck, no obvious bleeding Lungs: Clear to auscultation bilaterally CVS: Regular rate and rhythm, no murmur GI/Abd soft, nontender, nondistended, bowel sound present CNS: Alert, awake, oriented x3 Psychiatry: Mood appropriate Extremities: No pedal edema, no calf tenderness.  Left foot has external fixator and dressing in place.  Distal neurovascular status intact.  Right arm with proximal swelling and pain.  Data Review:  I have personally reviewed the laboratory data and studies available.  Recent Labs  Lab 10/25/19 0357 10/30/19 0406  WBC 6.7 6.1  NEUTROABS 4.1 3.3  HGB 9.6* 9.6*  HCT 30.0* 30.3*  MCV 93.5 92.9  PLT 311 309   Recent Labs  Lab 10/25/19 0357 10/30/19 0406  NA 137 139  K 4.0 4.0  CL 104 108  CO2 26 23  GLUCOSE 101* 100*  BUN 10 11  CREATININE 0.71 0.69  CALCIUM 8.9 8.8*  MG 1.8 1.7  PHOS 3.6 3.6    Signed, Terrilee Croak, MD [facility] Pager: [phone] (Secure Chat preferred). 10/31/2019

## 2019-10-31 NOTE — Progress Notes (Signed)
PT Cancellation Note  Patient Details Name: Alexis Ball MRN: [id number] DOB: December 08, 1955   Cancelled Treatment:    Reason Eval/Treat Not Completed: Other (comment) Pt declining therapeutic exercises or out of bed mobility due to pain and fatigue. States she has been working on bed level exercises and getting out of bed to bedside commode. Agreeable to participate tomorrow.    Wyona Almas, PT, DPT Acute Rehabilitation Services Pager [phone] Office [phone]    Deno Etienne 10/31/2019, 3:05 PM

## 2019-10-31 NOTE — Plan of Care (Signed)
  Problem: Health Behavior/Discharge Planning: Goal: Ability to manage health-related needs will improve Outcome: Progressing   Problem: Clinical Measurements: Goal: Ability to maintain clinical measurements within normal limits will improve Outcome: Progressing   Problem: Nutrition: Goal: Adequate nutrition will be maintained Outcome: Progressing   Problem: Coping: Goal: Level of anxiety will decrease Outcome: Progressing   Problem: Elimination: Goal: Will not experience complications related to urinary retention Outcome: Progressing   Problem: Pain Managment: Goal: General experience of comfort will improve Outcome: Progressing   Problem: Safety: Goal: Ability to remain free from injury will improve Outcome: Progressing   Problem: Skin Integrity: Goal: Risk for impaired skin integrity will decrease Outcome: Progressing

## 2019-10-31 NOTE — Consult Note (Signed)
[facility] Nurse wound follow up Wound type: surgical, infectious Measurement:see 7/19 note.  Unchagned.  Visible tendon remains in great toe wound.  Protected with silicone contact layer.  Is moist.  Intact skin between wounds protected with drape.  Wound FFK:VQOHC red, tendon Drainage (amount, consistency, odor) minimal serosanguinous  No odor.  Periwound:intact  Seal is challenging, barrier ring is used to promote seal.  Dressing procedure/placement/frequency:Change mon/Wed/Fri.  Requires Mepilex and barrier ring.  [place] team will follow.  Domenic Moras MSN, RN, FNP-BC CWON Wound, Ostomy, Continence Nurse Pager [phone]

## 2019-10-31 NOTE — Progress Notes (Signed)
Upper extremity venous has been completed.   Preliminary results in CV Proc.   [name] 10/31/2019 11:55 AM

## 2019-10-31 NOTE — Progress Notes (Signed)
Confirmed with vascular that Pt is on the list for doppler study.

## 2019-10-31 NOTE — Progress Notes (Signed)
Patient has a Single lumen picc in the upper R arm, placed July 8;  RN has asked IV Therapy to assess due to her arm being sore, tender to touch;  Right upper arm is warmer, and bigger in size compared to her Left upper arm;  picc has a good blood return and flushes easily;  Suggest a Doppler study on the RUA.   Thank you.   Raynelle Fanning RN IV Team

## 2019-11-01 ENCOUNTER — Encounter: Payer: Medicare HMO | Admitting: Podiatry

## 2019-11-01 DIAGNOSIS — M71072 Abscess of bursa, left ankle and foot: Secondary | ICD-10-CM | POA: Diagnosis not present

## 2019-11-01 MED ORDER — DICLOFENAC SODIUM 1 % EX GEL
2.0000 g | Freq: Four times a day (QID) | CUTANEOUS | Status: DC
  Administered 2019-11-01 – 2019-11-09 (×32): 2 g via TOPICAL
  Filled 2019-11-01: qty 100

## 2019-11-01 NOTE — Progress Notes (Signed)
PROGRESS NOTE  Alexis Ball  DOB: 1955/10/19  PCP: Lucianne Lei, MD TWS:[id number]  DOA: 10/13/2019  LOS: 18 days   Chief Complaint  Patient presents with  . Wound Check   Brief narrative: 64 year old with history of hypertension, remote CVA, recent bunionectomy with silicone implant of her left foot on June 25. She presented to the ED on 7/3 with worsening pain and failed outpatient antibiotic therapy. As per report, her wound was open with maggots coming out.   Admitted to the hospital and underwent surgical debridement and external fixation.   She is currently on antibiotics, waiting to go to a skilled nursing facility.  Subjective: Patient was seen and examined this morning.  Lying down on bed.  Not in distress.  Complains of mild pain in the right shoulder. Patient is complaining of swelling and pain of her right shoulder at the site of PICC line.  Assessment/Plan: Left forefoot abscess, cellulitis and osteomyelitis: -7/4, incision and drainage by Dr. March Rummage -7/7, debridement and external fixation. Removal of Silicone Implant; Insertion of Antibiotic Spacer; Application of External Fixator;  Application of Wound VAC (Left)  -Wound culture positive for Enterobacter cloacae and Finegoldia Magna, initially treated with multiple antibiotics, now remains on ertapenem until 8/18 through her PICC line as per ID recommendation. -Clinically improving.  Wound VAC in place.  Underwent wound dressing this morning.  Skilled nursing facility for skilled care.  Right arm superficial vein thrombosis -7/21, ultrasound duplex of right upper extremity was obtained because of pain and swelling.  DVT ruled out.  Noted superficial vein thrombosis on cephalic vein.  Voltaren gel ordered for pain control.  Okay to continue to use PICC line.    Hypertension: Blood pressure is stable.  Hypokalemia/hypomagnesemia: Improved.  Last blood work from 7/20.  Replacement given.  Left foot pain: Pain  controlled on opiates and nonopiates.  Mobility: PT/OT evaluation obtained. SNF recommended Code Status:   Code Status: Full Code  Nutritional status: Body mass index is 32.06 kg/m.     Diet Order            Diet regular Room service appropriate? Yes; Fluid consistency: Thin  Diet effective now                 DVT prophylaxis: enoxaparin (LOVENOX) injection 40 mg Start: 10/15/19 0800   Antimicrobials:  Ertapenem IV planned till 8/18 Fluid: None  Consultants: ID, podiatry. Family Communication:  None at bedside  Status is: Inpatient  Remains inpatient appropriate because:Unsafe d/c plan and IV treatments appropriate due to intensity of illness or inability to take PO  Dispo:  Patient From: Home  Planned Disposition: [place]  Expected discharge date: Whenever bed is available.  Medically stable for discharge: Yes  Infusions:  . ertapenem Stopped (10/31/19 1514)    Scheduled Meds: . vitamin C  500 mg Oral Daily  . aspirin EC  81 mg Oral Daily  . brimonidine  1 drop Both Eyes BID  . Chlorhexidine Gluconate Cloth  6 each Topical Daily  . diclofenac Sodium  2 g Topical QID  . dorzolamide-timolol  1 drop Both Eyes BID  . enoxaparin (LOVENOX) injection  40 mg Subcutaneous Q24H  . ferrous sulfate  325 mg Oral Q breakfast  . gabapentin  300 mg Oral q morning - 10a   And  . gabapentin  600 mg Oral QHS  . latanoprost  1 drop Both Eyes QHS  . lisinopril  2.5 mg Oral Daily  .  methocarbamol  500 mg Oral q1600  . multivitamin with minerals  1 tablet Oral Daily  . omega-3 acid ethyl esters  1 g Oral Daily  . oxyCODONE  10 mg Oral Q12H  . sodium chloride flush  10-40 mL Intracatheter Q12H    Antimicrobials: Anti-infectives (From admission, onward)   Start     Dose/Rate Route Frequency Ordered Stop   10/18/19 1400  ertapenem (INVANZ) 1,000 mg in sodium chloride 0.9 % 100 mL IVPB     Discontinue     1 g 200 mL/hr over 30 Minutes Intravenous Every 24  hours 10/18/19 0909     10/17/19 1739  vancomycin (VANCOCIN) powder  Status:  Discontinued          As needed 10/17/19 1740 10/17/19 1820   10/16/19 1400  ceFEPIme (MAXIPIME) 2 g in sodium chloride 0.9 % 100 mL IVPB  Status:  Discontinued        2 g 200 mL/hr over 30 Minutes Intravenous Every 8 hours 10/16/19 1233 10/18/19 0909   10/15/19 2100  vancomycin (VANCOCIN) IVPB 1000 mg/200 mL premix  Status:  Discontinued        1,000 mg 200 mL/hr over 60 Minutes Intravenous Every 12 hours 10/15/19 1155 10/16/19 1233   10/15/19 0800  vancomycin (VANCOREADY) IVPB 750 mg/150 mL  Status:  Discontinued        750 mg 150 mL/hr over 60 Minutes Intravenous Every 12 hours 10/14/19 1806 10/15/19 1155   10/14/19 1830  piperacillin-tazobactam (ZOSYN) IVPB 3.375 g  Status:  Discontinued        3.375 g 12.5 mL/hr over 240 Minutes Intravenous Every 8 hours 10/14/19 1806 10/16/19 1234   10/14/19 1800  vancomycin (VANCOREADY) IVPB 1500 mg/300 mL        1,500 mg 150 mL/hr over 120 Minutes Intravenous  Once 10/14/19 1745 10/14/19 2051   10/14/19 1440  vancomycin (VANCOCIN) powder  Status:  Discontinued          As needed 10/14/19 1531 10/14/19 1534   10/14/19 1400  ceFAZolin (ANCEF) IVPB 1 g/50 mL premix  Status:  Discontinued        1 g 100 mL/hr over 30 Minutes Intravenous Every 8 hours 10/14/19 1135 10/14/19 1741   10/14/19 1000  doxycycline (VIBRA-TABS) tablet 100 mg  Status:  Discontinued        100 mg Oral Every 12 hours 10/14/19 0840 10/14/19 1745   10/14/19 0630  piperacillin-tazobactam (ZOSYN) IVPB 3.375 g        3.375 g 100 mL/hr over 30 Minutes Intravenous  Once 10/14/19 0623 10/14/19 0736      PRN meds: HYDROcodone-acetaminophen, ibuprofen, lidocaine, ondansetron, polyethylene glycol, sodium chloride flush   Objective: Vitals:   11/01/19 0321 11/01/19 0815  BP: 123/75 123/81  Pulse: 65 66  Resp: 17 18  Temp: 97.8 F (36.6 C) 98.2 F (36.8 C)  SpO2: 97% 98%    Intake/Output Summary  (Last 24 hours) at 11/01/2019 1228 Last data filed at 10/31/2019 1759 Gross per 24 hour  Intake 340.09 ml  Output --  Net 340.09 ml   Filed Weights   10/13/19 2329 10/17/19 1452  Weight: 82.1 kg 82.1 kg   Weight change:  Body mass index is 32.06 kg/m.   Physical Exam: General exam: Appears calm and comfortable.  Not in physical distress Skin: No rashes, lesions or ulcers. HEENT: Atraumatic, normocephalic, supple neck, no obvious bleeding Lungs: Clear to auscultation bilaterally CVS: Regular rate and rhythm, no murmur  GI/Abd soft, nontender, nondistended, bowel sound present CNS: Alert, awake, oriented x3 Psychiatry: Mood appropriate Extremities: No pedal edema, no calf tenderness.  Left foot has external fixator and dressing in place.  Distal neurovascular status intact.  Right arm proximal swelling and pain remains.  PICC line in right arm.  Data Review: I have personally reviewed the laboratory data and studies available.  Recent Labs  Lab 10/30/19 0406  WBC 6.1  NEUTROABS 3.3  HGB 9.6*  HCT 30.3*  MCV 92.9  PLT 309   Recent Labs  Lab 10/30/19 0406  NA 139  K 4.0  CL 108  CO2 23  GLUCOSE 100*  BUN 11  CREATININE 0.69  CALCIUM 8.8*  MG 1.7  PHOS 3.6    Signed, Terrilee Croak, MD [facility] Pager: [phone] (Secure Chat preferred). 11/01/2019

## 2019-11-01 NOTE — Progress Notes (Signed)
Physical Therapy Treatment Patient Details Name: Alexis Ball MRN: [id number] DOB: [birth year] Today's Date: 11/01/2019    History of Present Illness Pt is a 64 yo female presenting s/p surgical drainage of L foot infection 7/4 with bone biopsy due to concern for possible osteomyelitis after bunionectomy with silicone implant on 5/80. Further examination reports osteomyelitis with infection of hardware.  Pt s/p I&D with hard ware removal/silicon implant removal and now ex fix and antibiotic spacer placed.  Surgeon also placed wound vac after procedure.   PMH includes: HTN, and stroke.    PT Comments    Patient progressing able to come close to goal for ambulation distance so updated along with other goals extended this session.  She reports will need nursing for wound management which is why she is agreeable to STSNF level care.  Can continue PT in that venue as well.  Encouraged bed exercises daily and to ask nursing to walk when therapy not able to see her.  Will continue skilled PT during acute stay as per plan.   Follow Up Recommendations  SNF     Equipment Recommendations  None recommended by PT    Recommendations for Other Services       Precautions / Restrictions Precautions Precautions: Fall Required Braces or Orthoses: Other Brace Other Brace: CAM boot LLE over ex fix Restrictions Weight Bearing Restrictions: Yes LLE Weight Bearing: Weight bearing as tolerated (heel weight bearing in boot)    Mobility  Bed Mobility         Supine to sit: HOB elevated;Supervision Sit to supine: Supervision   General bed mobility comments: assist to don/doff shoe and boot  Transfers Overall transfer level: Needs assistance Equipment used: Rolling walker (2 wheeled) Transfers: Sit to/from Stand Sit to Stand: Min guard         General transfer comment: assist for balance up from EOB and BSC, cues for safety backing and to sit on [facility]  Ambulation/Gait Ambulation/Gait  assistance: Min guard Gait Distance (Feet): 90 Feet Assistive device: Rolling walker (2 wheeled) Gait Pattern/deviations: Step-to pattern;Trunk flexed;Decreased stride length     General Gait Details: slow pace and with cues throughout to make sure heel weight only, wound vac alarming leak, but once settled in bed resolved   Stairs             Wheelchair Mobility    Modified Rankin (Stroke Patients Only)       Balance Overall balance assessment: Needs assistance Sitting-balance support: Feet supported Sitting balance-Leahy Scale: Good     Standing balance support: During functional activity;Single extremity supported;No upper extremity supported Standing balance-Leahy Scale: Poor Standing balance comment: standing for perineal hygiene with minguard for safety and intermittent UE support                            Cognition Arousal/Alertness: Awake/alert Behavior During Therapy: WFL for tasks assessed/performed Overall Cognitive Status: Within Functional Limits for tasks assessed                                        Exercises      General Comments General comments (skin integrity, edema, etc.): daughter arrived during session      Pertinent Vitals/Pain Pain Score: 4  Pain Location: foot Pain Descriptors / Indicators: Throbbing Pain Intervention(s): Monitored during session;Repositioned    Home Living  Prior Function            PT Goals (current goals can now be found in the care plan section) Acute Rehab PT Goals Patient Stated Goal: need to have nursing care for my wound PT Goal Formulation: With patient Time For Goal Achievement: 11/15/19 Potential to Achieve Goals: Good Progress towards PT goals: Progressing toward goals (goals updated/renewed this session)    Frequency    Min 3X/week      PT Plan Discharge plan needs to be updated    Co-evaluation              AM-PAC PT  "6 Clicks" Mobility   Outcome Measure  Help needed turning from your back to your side while in a flat bed without using bedrails?: None Help needed moving from lying on your back to sitting on the side of a flat bed without using bedrails?: None Help needed moving to and from a bed to a chair (including a wheelchair)?: A Little Help needed standing up from a chair using your arms (e.g., wheelchair or bedside chair)?: A Little Help needed to walk in hospital room?: A Little Help needed climbing 3-5 steps with a railing? : A Little 6 Click Score: 20    End of Session   Activity Tolerance: Patient tolerated treatment well Patient left: in bed;with call bell/phone within reach;with family/visitor present (in bed due to PICC Line to be adjusted today)   PT Visit Diagnosis: Other abnormalities of gait and mobility (R26.89);Muscle weakness (generalized) (M62.81);Pain Pain - Right/Left: Left Pain - part of body: Ankle and joints of foot     Time: 4742-5956 PT Time Calculation (min) (ACUTE ONLY): 23 min  Charges:  $Gait Training: 8-22 mins $Therapeutic Activity: 8-22 mins                     Magda Kiel, PT Acute Rehabilitation Services Pager:[phone] Office:[phone] 11/01/2019    Reginia Naas 11/01/2019, 10:47 AM

## 2019-11-02 DIAGNOSIS — I82611 Acute embolism and thrombosis of superficial veins of right upper extremity: Secondary | ICD-10-CM | POA: Diagnosis not present

## 2019-11-02 DIAGNOSIS — L02612 Cutaneous abscess of left foot: Secondary | ICD-10-CM | POA: Diagnosis not present

## 2019-11-02 NOTE — Progress Notes (Signed)
PROGRESS NOTE    Alexis Ball   ZDG:[id number]  DOB: [birth year]  DOA: 10/13/2019     19  PCP: Lucianne Lei, MD  CC: foot infection  Hospital Course: Alexis Ball is a 64 yo AAF with PMH hypertension, remote CVA, recent bunionectomy with silicone implant of her left foot on June 25. She presented to the ED on 7/3 with worsening pain and failed outpatient antibiotic therapy. As per report, her wound was open with maggots coming out.   Admitted to the hospital and underwent surgical debridement and external fixation.   She is currently on ertapenem until 11/28/19 per ID PICC in place and is awaiting dispo to SNF when available.    Interval History:  No events overnight. Resting in chair bedside this am in no distress and feeling okay. Dressing change done this am. Pain is controlled.   Old records reviewed in assessment of this patient  ROS: Constitutional: negative for chills and fevers, Respiratory: negative for cough and dyspnea on exertion, Cardiovascular: negative for chest pain and Gastrointestinal: negative for abdominal pain and constipation  Assessment & Plan: Foot abscess, left -7/4, incision and drainage by Dr. March Rummage -7/7, debridement and external fixation. Removal of Silicone Implant; Insertion of Antibiotic Spacer; Application of External Fixator; Application of Wound VAC (Left)  -Wound culture positive for Enterobacter cloacae and Finegoldia Magna, initially treated with multiple antibiotics, now remains on ertapenem until 8/18 through her PICC line as per ID recommendation. -Clinically improving. Wound VAC in place. Skilled nursing facility for skilled care. UPON DISCHARGE: Remove Vac machinhe and dressing and apply Xeroform gauze over foot wounds Q day then cover with gauze and ace wrap.  Abscess of bursa of left foot - see foot abscess  Wound infection - see foot abscess  Superficial venous thrombosis of arm, right - -7/21, ultrasound duplex of right upper  extremity was obtained because of pain and swelling.  DVT ruled out.  Noted superficial vein thrombosis on cephalic vein.  Voltaren gel ordered for pain control.  Okay to continue to use PICC line.    Hypertension - continue current regimen   Antimicrobials: Ertapenem until 11/28/19 per ID  DVT prophylaxis: Lovenox Code Status: Full Family Communication: none present Disposition Plan:  Status is: Inpatient  Remains inpatient appropriate because:Ongoing active pain requiring inpatient pain management, IV treatments appropriate due to intensity of illness or inability to take PO and Inpatient level of care appropriate due to severity of illness   Dispo:  Patient From: Home  Planned Disposition: [place]  Expected discharge date: 10/26/19  Medically stable for discharge: Yes        Objective: Blood pressure (!) 146/55, pulse 66, temperature 97.8 F (36.6 C), temperature source Oral, resp. rate 15, height 5\' 3"  (1.6 m), weight 82.1 kg, SpO2 96 %.  Examination: General appearance: alert, cooperative and no distress Head: Normocephalic, without obvious abnormality, atraumatic Eyes: EOMI Lungs: clear to auscultation bilaterally Heart: regular rate and rhythm and S1, S2 normal Abdomen: normal findings: bowel sounds normal and soft, non-tender Extremities: left foot in boot with fixation nails in place and [place] sealed in place Skin: mobility and turgor normal Neurologic: Grossly normal   Consultants:   Ortho surgery  Procedures:   7/4: I&D, bone bx 7/9: Debridement and irrigation of left foot wound, Removal of Hardware, Application of Multiplanar External Fixator, Insertion of Abx delivery Device, Application wound VAC  Data Reviewed: I have personally reviewed following labs and imaging studies No results found  for this or any previous visit (from the past 24 hour(s)).  No results found for this or any previous visit (from the past 240 hour(s)).    Radiology Studies: VAS [place] UPPER EXTREMITY VENOUS DUPLEX  Result Date: 10/31/2019 UPPER VENOUS STUDY  Indications: Swelling, and Pain Comparison Study: no priro Performing Technologist: [name] RVS  Examination Guidelines: A complete evaluation includes B-mode imaging, spectral Doppler, color Doppler, and power Doppler as needed of all accessible portions of each vessel. Bilateral testing is considered an integral part of a complete examination. Limited examinations for reoccurring indications may be performed as noted.  Right Findings: +----------+------------+---------+-----------+----------+-----------------+ RIGHT     CompressiblePhasicitySpontaneousProperties     Summary      +----------+------------+---------+-----------+----------+-----------------+ IJV           Full       Yes       Yes                                +----------+------------+---------+-----------+----------+-----------------+ Subclavian    Full       Yes       Yes                                +----------+------------+---------+-----------+----------+-----------------+ Axillary      Full       Yes       Yes                                +----------+------------+---------+-----------+----------+-----------------+ Brachial      Full       Yes       Yes                                +----------+------------+---------+-----------+----------+-----------------+ Radial        Full                                                    +----------+------------+---------+-----------+----------+-----------------+ Ulnar         Full                                                    +----------+------------+---------+-----------+----------+-----------------+ Cephalic      None                                  Age Indeterminate +----------+------------+---------+-----------+----------+-----------------+ Basilic       Full                                                     +----------+------------+---------+-----------+----------+-----------------+  Summary:  Right: No evidence of deep vein thrombosis in the upper extremity. Findings consistent with age indeterminate superficial vein thrombosis involving the right cephalic vein.  *See table(s) above for measurements  and observations.  Diagnosing physician: Ruta Hinds MD Electronically signed by Ruta Hinds MD on 10/31/2019 at 6:11:42 PM.    Final    VAS [place] UPPER EXTREMITY VENOUS DUPLEX  Final Result    DG C-Arm 1-60 Min  Final Result    DG Foot Complete Left  Final Result    [place] EKG SITE RITE  Final Result    [place] EKG SITE RITE  Final Result    MR FOOT LEFT WO CONTRAST  Final Result    DG Foot 2 Views Left  Final Result       Scheduled Meds: . vitamin C  500 mg Oral Daily  . aspirin EC  81 mg Oral Daily  . brimonidine  1 drop Both Eyes BID  . Chlorhexidine Gluconate Cloth  6 each Topical Daily  . diclofenac Sodium  2 g Topical QID  . dorzolamide-timolol  1 drop Both Eyes BID  . enoxaparin (LOVENOX) injection  40 mg Subcutaneous Q24H  . ferrous sulfate  325 mg Oral Q breakfast  . gabapentin  300 mg Oral q morning - 10a   And  . gabapentin  600 mg Oral QHS  . latanoprost  1 drop Both Eyes QHS  . lisinopril  2.5 mg Oral Daily  . methocarbamol  500 mg Oral q1600  . multivitamin with minerals  1 tablet Oral Daily  . omega-3 acid ethyl esters  1 g Oral Daily  . oxyCODONE  10 mg Oral Q12H  . sodium chloride flush  10-40 mL Intracatheter Q12H   PRN Meds: HYDROcodone-acetaminophen, ibuprofen, lidocaine, ondansetron, polyethylene glycol, sodium chloride flush Continuous Infusions: . ertapenem 1,000 mg (11/01/19 1307)      LOS: 19 days  Time spent: Greater than 50% of the 35 minute visit was spent in counseling/coordination of care for the patient as laid out in the A&P.   Dwyane Dee, MD [facility] 11/02/2019, 10:02 AM   Contact via secure chat.  To contact the attending  provider between 7A-7P or the covering provider during after hours 7P-7A, please log into the web site [email or web address] and access using universal [place] password for that web site. If you do not have the password, please call the hospital operator.

## 2019-11-02 NOTE — Assessment & Plan Note (Addendum)
- -  7/21, ultrasound duplex of right upper extremity was obtained because of pain and swelling.  DVT ruled out.  Noted superficial vein thrombosis on cephalic vein.  Voltaren gel ordered for pain control.  Okay to continue to use PICC line.

## 2019-11-02 NOTE — Assessment & Plan Note (Signed)
continue current regimen.

## 2019-11-02 NOTE — TOC Progression Note (Signed)
Transition of Care [facility]) - Progression Note    Patient Details  Name: ROIZA WIEDEL MRN: [id number] Date of Birth: July 22, 1955  [facility] CM/SW Contact  Sharin Mons, RN Phone Number: 11/02/2019, 7:35 AM  Clinical Narrative:    Late entry: 7/22 NCM made aware per [facility] admissions liaison [facility] has denied SNF authorization.  Liaison stated pt with medicaid and they are now seeking approval with her Medicaid. Question asked does pt still need wound vac? Per liaison it probably would make it easier to obtain approval. NCM to f/u with MD......       Expected Discharge Plan: Skilled Nursing Facility Barriers to Discharge: Insurance Authorization [facility] Medicare denied SNF, [facility] trying to get approval with Medicaid)  Expected Discharge Plan and Services Expected Discharge Plan: [place]     Post Acute Care Choice: [place]                   DME Arranged:  (IV ABX THERAPY) DME Agency: Other - Comment Roel Cluck) Date DME Agency Contacted: 10/15/19 Time DME Agency Contacted: [phone] Representative spoke with at DME Agency: [place]: PT, RN [place] Agency: [facility] [place] Date [place]: 10/19/19 Time [place]: 59 Representative spoke with at [place]: [place]akena ([place]) Interventions    Readmission Risk Interventions No flowsheet data found.

## 2019-11-02 NOTE — Hospital Course (Addendum)
Alexis Ball is a 64 yo AAF with PMH hypertension, remote CVA, recent bunionectomy with silicone implant of her left foot on June 25. She presented to the ED on 7/3 with worsening pain and failed outpatient antibiotic therapy. As per report, her wound was open with maggots coming out.   Admitted to the hospital and underwent surgical debridement and external fixation.   She is currently on ertapenem until 11/28/19 per ID PICC in place and is awaiting dispo to SNF when available.

## 2019-11-02 NOTE — Assessment & Plan Note (Signed)
-   see foot abscess

## 2019-11-02 NOTE — Discharge Instructions (Signed)
Discharge wound orders: Cleanse wound daily: apply silver collagen dressing to wound daily. Dress with 4x4, kerlix, and ACE bandage.

## 2019-11-02 NOTE — Assessment & Plan Note (Addendum)
-  7/4, incision and drainage by Dr. March Rummage -7/7, debridement and external fixation. Removal of Silicone Implant; Insertion of Antibiotic Spacer; Application of External Fixator; Application of Wound VAC (Left)  -Wound culture positive for Enterobacter cloacae and Finegoldia Magna, initially treated with multiple antibiotics, now remains on ertapenem until 8/18 through her PICC line as per ID recommendation. -Clinically improving. Wound VAC in place. Skilled nursing facility for skilled care. UPON DISCHARGE: Remove Vac machinhe and dressing and apply Xeroform gauze over foot wounds Q day then cover with gauze and ace wrap.

## 2019-11-02 NOTE — Progress Notes (Signed)
,    Subjective:  Patient ID: Alexis Ball, female    DOB: 04-17-1955,  MRN: [id number]  Patient seen bedside. Interval events since last seen include issues with PICC line, now replaced. Still pending placement. Wound has been looking good per patient. She denies complaints. Objective:   Vitals:   11/02/19 0500 11/02/19 0953  BP: (!) 144/73 (!) 146/55  Pulse: 59 66  Resp: 14 15  Temp: 98.9 F (37.2 C) 97.8 F (36.6 C)  SpO2: 99% 96%   General AA&O x3. Normal mood and affect.  Vascular  foot warm and well-perfused  Neurologic Epicritic sensation grossly intact.  Dermatologic  Dressing removed today, wound healing well, still with some exposed tendon at the 1st metatarsal area.  1st met wound measuring 5x1.5. Wound bed fully granular except for visible tendon. 2nd met wound measuring 2x1. Fully granular  Neither wounds with signs of infection  Orthopedic: Positive motor to the digits other than 1st toe Fixator intact,   Assessment & Plan:  Patient was evaluated and treated and all questions answered.  L Foot Osteomyelitis s/p ROH, Application of External Fixator, Insertion of Abx Spacer, Debridement and Irrigation of Wounds -Abx per ID. -PICC line in place -No longer needs wound VAC at d/c. -Wound care: Change Wound VAC today. Ok to d/c Monday or sooner in anticipation for d/c. -Heel WBAT in CAM boot -Collagen Ag to both wounds at D/c. Will update orders.  Ok for d/c from podiatry standpoint once home health care / SNF needs met.  Evelina Bucy, DPM  Accessible via secure chat for questions or concerns.

## 2019-11-02 NOTE — Consult Note (Addendum)
[place] Nurse wound follow up     [place] left foot. Pt had surgery and is followed by podiatry for plan of care.  External fixator and 2 full thickness post-op wounds to left foot.  Measurement:Pt tolerated without c/o pain.Anterior foot with red moist wounds; beefy red separated by narrow bridge of skin,visible tendon near great toe.Applied a barrier ring to attempt to maintain a seal aound the edges, then xeroform and 1 piece black foam and covered with drape and attached to 41mm cont suction. Small amt pink drainage, applied ace wrap. Heel floated to reduce pressure. Dressing procedure/placement/frequency: Podiatry team was in earlier to remove Vac and assess wound appearance.  They have provided the following orders: BEDSIDE NURSE: UPON DISCHARGE: Remove Vac machinhe and dressing and apply Xeroform gauze over foot wounds Q day then cover with gauze and ace wrap. Please re-consult if further assistance is needed.  Thank-you,  Julien Girt MSN, [place], [place], [place], [place]

## 2019-11-02 NOTE — Progress Notes (Signed)
Physical Therapy Treatment Patient Details Name: Alexis Ball MRN: [id number] DOB: [birth year] Today's Date: 11/02/2019    History of Present Illness Pt is a 64 yo female presenting s/p surgical drainage of L foot infection 7/4 with bone biopsy due to concern for possible osteomyelitis after bunionectomy with silicone implant on 8/18. Further examination reports osteomyelitis with infection of hardware.  Pt s/p I&D with hard ware removal/silicon implant removal and now ex fix and antibiotic spacer placed.  Surgeon also placed wound vac after procedure.   PMH includes: HTN, and stroke.   PT Comments    Pt progressing with mobility. Today's session focused on transfers and gait training with RW. Pt remains limited by pain, generalized weakness, decreased activity tolerance and impaired balance strategies/postural reactions; at high risk for falls. Continue to recommend SNF-level therapies to maximize functional mobility and independence, as well as for medical management, prior to return home. Pt motivated to participate.    Follow Up Recommendations  SNF     Equipment Recommendations  None recommended by PT    Recommendations for Other Services       Precautions / Restrictions Precautions Precautions: Fall Required Braces or Orthoses: Other Brace Other Brace: CAM boot LLE over ex fix Restrictions Weight Bearing Restrictions: Yes LLE Weight Bearing: Partial weight bearing LLE Partial Weight Bearing Percentage or Pounds: WB thur heel in cam walker boot    Mobility  Bed Mobility               General bed mobility comments: Received using BSC  Transfers Overall transfer level: Needs assistance Equipment used: Rolling walker (2 wheeled) Transfers: Sit to/from Stand Sit to Stand: Min guard         General transfer comment: Initially stood from [facility] to RW without CAM boot donned, good ability to maintain LLE NWB, close min guard for balance due to instability;  additional trial with cam boot 2x from recliner, min guard for balance  Ambulation/Gait Ambulation/Gait assistance: Min guard Gait Distance (Feet): 80 Feet (+40) Assistive device: Rolling walker (2 wheeled) Gait Pattern/deviations: Step-to pattern;Trunk flexed;Decreased stride length;Decreased weight shift to right;Antalgic Gait velocity: Decreased Gait velocity interpretation: <1.31 ft/sec, indicative of household ambulator General Gait Details: Slow, mildly unsteady gait with RW and min guard for balance; instability especially with turns. Cues to ensure WBAT thru R heel; 1x standing rest break secondary to pain and fatigue   Stairs             Wheelchair Mobility    Modified Rankin (Stroke Patients Only)       Balance Overall balance assessment: Needs assistance Sitting-balance support: Feet supported Sitting balance-Leahy Scale: Good     Standing balance support: During functional activity;Single extremity supported;No upper extremity supported Standing balance-Leahy Scale: Poor Standing balance comment: Reliant on UE support                            Cognition Arousal/Alertness: Awake/alert Behavior During Therapy: WFL for tasks assessed/performed Overall Cognitive Status: Within Functional Limits for tasks assessed                                        Exercises      General Comments        Pertinent Vitals/Pain Pain Assessment: 0-10 Pain Score: 6  Pain Location: L foot Pain Descriptors / Indicators: Throbbing  Pain Intervention(s): Monitored during session;Repositioned    Home Living                      Prior Function            PT Goals (current goals can now be found in the care plan section) Progress towards PT goals: Progressing toward goals    Frequency    Min 3X/week      PT Plan Current plan remains appropriate    Co-evaluation              AM-PAC PT "6 Clicks" Mobility    Outcome Measure  Help needed turning from your back to your side while in a flat bed without using bedrails?: None Help needed moving from lying on your back to sitting on the side of a flat bed without using bedrails?: None Help needed moving to and from a bed to a chair (including a wheelchair)?: A Little Help needed standing up from a chair using your arms (e.g., wheelchair or bedside chair)?: A Little Help needed to walk in hospital room?: A Little Help needed climbing 3-5 steps with a railing? : A Little 6 Click Score: 20    End of Session Equipment Utilized During Treatment: Gait belt Activity Tolerance: Patient tolerated treatment well Patient left: in chair;with call bell/phone within reach Nurse Communication: Mobility status PT Visit Diagnosis: Other abnormalities of gait and mobility (R26.89);Muscle weakness (generalized) (M62.81);Pain Pain - Right/Left: Left Pain - part of body: Ankle and joints of foot     Time: 0812-0832 PT Time Calculation (min) (ACUTE ONLY): 20 min  Charges:  $Gait Training: 8-22 mins                     [name], PT, DPT Acute Rehabilitation Services  Pager [phone] Office [place] 11/02/2019, 9:31 AM

## 2019-11-03 DIAGNOSIS — L02612 Cutaneous abscess of left foot: Secondary | ICD-10-CM | POA: Diagnosis not present

## 2019-11-03 LAB — CBC WITH DIFFERENTIAL/PLATELET
Abs Immature Granulocytes: 0.01 10*3/uL (ref 0.00–0.07)
Basophils Absolute: 0.1 10*3/uL (ref 0.0–0.1)
Basophils Relative: 1 %
Eosinophils Absolute: 0.3 10*3/uL (ref 0.0–0.5)
Eosinophils Relative: 5 %
HCT: 30.6 % — ABNORMAL LOW (ref 36.0–46.0)
Hemoglobin: 9.7 g/dL — ABNORMAL LOW (ref 12.0–15.0)
Immature Granulocytes: 0 %
Lymphocytes Relative: 31 %
Lymphs Abs: 1.6 10*3/uL (ref 0.7–4.0)
MCH: 29.6 pg (ref 26.0–34.0)
MCHC: 31.7 g/dL (ref 30.0–36.0)
MCV: 93.3 fL (ref 80.0–100.0)
Monocytes Absolute: 0.5 10*3/uL (ref 0.1–1.0)
Monocytes Relative: 11 %
Neutro Abs: 2.7 10*3/uL (ref 1.7–7.7)
Neutrophils Relative %: 52 %
Platelets: 277 10*3/uL (ref 150–400)
RBC: 3.28 MIL/uL — ABNORMAL LOW (ref 3.87–5.11)
RDW: 13.2 % (ref 11.5–15.5)
WBC: 5.1 10*3/uL (ref 4.0–10.5)
nRBC: 0 % (ref 0.0–0.2)

## 2019-11-03 LAB — BASIC METABOLIC PANEL
Anion gap: 6 (ref 5–15)
BUN: 15 mg/dL (ref 8–23)
CO2: 25 mmol/L (ref 22–32)
Calcium: 8.9 mg/dL (ref 8.9–10.3)
Chloride: 106 mmol/L (ref 98–111)
Creatinine, Ser: 0.78 mg/dL (ref 0.44–1.00)
GFR calc Af Amer: >60 mL/min (ref >60)
GFR calc non Af Amer: >60 mL/min (ref >60)
Glucose, Bld: 96 mg/dL (ref 70–99)
Potassium: 4 mmol/L (ref 3.5–5.1)
Sodium: 137 mmol/L (ref 135–145)

## 2019-11-03 LAB — MAGNESIUM: Magnesium: 1.8 mg/dL (ref 1.7–2.4)

## 2019-11-03 NOTE — Plan of Care (Signed)
  Problem: Pain Managment: Goal: General experience of comfort will improve Outcome: Progressing   Problem: Safety: Goal: Ability to remain free from injury will improve Outcome: Progressing   Problem: Skin Integrity: Goal: Risk for impaired skin integrity will decrease Outcome: Progressing   

## 2019-11-03 NOTE — Plan of Care (Signed)
  Problem: Clinical Measurements: Goal: Will remain free from infection Outcome: Progressing   Problem: Pain Managment: Goal: General experience of comfort will improve Outcome: Progressing   

## 2019-11-03 NOTE — Progress Notes (Signed)
PROGRESS NOTE    Alexis Ball   WFU:[id number]  DOB: [birth year]  DOA: 10/13/2019     20  PCP: Lucianne Lei, MD  CC: foot infection  Hospital Course: Alexis Ball is a 64 yo AAF with PMH hypertension, remote CVA, recent bunionectomy with silicone implant of her left foot on June 25. She presented to the ED on 7/3 with worsening pain and failed outpatient antibiotic therapy. As per report, her wound was open with maggots coming out.   Admitted to the hospital and underwent surgical debridement and external fixation.   She is currently on ertapenem until 11/28/19 per ID PICC in place and is awaiting dispo to SNF when available.    Interval History:  No events overnight.  Sitting up in chair bedside in no distress this morning.  Complaining of a little more foot pain then yesterday but otherwise has been working with physical therapy.  Old records reviewed in assessment of this patient  ROS: Constitutional: negative for chills and fevers, Respiratory: negative for cough and dyspnea on exertion, Cardiovascular: negative for chest pain and Gastrointestinal: negative for abdominal pain and constipation  Assessment & Plan: Foot abscess, left -7/4, incision and drainage by Dr. March Rummage -7/7, debridement and external fixation. Removal of Silicone Implant; Insertion of Antibiotic Spacer; Application of External Fixator; Application of Wound VAC (Left)  -Wound culture positive for Enterobacter cloacae and Finegoldia Magna, initially treated with multiple antibiotics, now remains on ertapenem until 8/18 through her PICC line as per ID recommendation. -Clinically improving. Wound VAC in place. Skilled nursing facility for skilled care. UPON DISCHARGE: Remove Vac machinhe and dressing and apply Xeroform gauze over foot wounds Q day then cover with gauze and ace wrap.  Abscess of bursa of left foot - see foot abscess  Wound infection - see foot abscess  Superficial venous thrombosis of arm,  right - -7/21, ultrasound duplex of right upper extremity was obtained because of pain and swelling.  DVT ruled out.  Noted superficial vein thrombosis on cephalic vein.  Voltaren gel ordered for pain control.  Okay to continue to use PICC line.    Hypertension - continue current regimen   Antimicrobials: Ertapenem until 11/28/19 per ID  DVT prophylaxis: Lovenox Code Status: Full Family Communication: none present Disposition Plan:  Status is: Inpatient  Remains inpatient appropriate because:Ongoing active pain requiring inpatient pain management, IV treatments appropriate due to intensity of illness or inability to take PO and Inpatient level of care appropriate due to severity of illness   Dispo:  Patient From: Home  Planned Disposition: [place]  Expected discharge date: 10/26/19  Medically stable for discharge: Yes   Objective: Blood pressure (!) 125/61, pulse 66, temperature 98 F (36.7 C), temperature source Oral, resp. rate 17, height 5\' 3"  (1.6 m), weight 82.1 kg, SpO2 94 %.  Examination: General appearance: alert, cooperative and no distress Head: Normocephalic, without obvious abnormality, atraumatic Eyes: EOMI Lungs: clear to auscultation bilaterally Heart: regular rate and rhythm and S1, S2 normal Abdomen: normal findings: bowel sounds normal and soft, non-tender Extremities: left foot in boot with fixation nails in place and [place] sealed in place Skin: mobility and turgor normal Neurologic: Grossly normal   Consultants:   Ortho surgery  Procedures:   7/4: I&D, bone bx 7/9: Debridement and irrigation of left foot wound, Removal of Hardware, Application of Multiplanar External Fixator, Insertion of Abx delivery Device, Application wound VAC  Data Reviewed: I have personally reviewed following labs and imaging studies  Results for orders placed or performed during the hospital encounter of 10/13/19 (from the past 24 hour(s))  Basic metabolic  panel     Status: None   Collection Time: 11/03/19  3:27 AM  Result Value Ref Range   Sodium 137 135 - 145 mmol/L   Potassium 4.0 3.5 - 5.1 mmol/L   Chloride 106 98 - 111 mmol/L   CO2 25 22 - 32 mmol/L   Glucose, Bld 96 70 - 99 mg/dL   BUN 15 8 - 23 mg/dL   Creatinine, Ser 0.78 0.44 - 1.00 mg/dL   Calcium 8.9 8.9 - 10.3 mg/dL   GFR calc non Af Amer >60 >60 mL/min   GFR calc Af Amer >60 >60 mL/min   Anion gap 6 5 - 15  CBC with Differential/Platelet     Status: Abnormal   Collection Time: 11/03/19  3:27 AM  Result Value Ref Range   WBC 5.1 4.0 - 10.5 K/uL   RBC 3.28 (L) 3.87 - 5.11 MIL/uL   Hemoglobin 9.7 (L) 12.0 - 15.0 g/dL   HCT 30.6 (L) 36 - 46 %   MCV 93.3 80.0 - 100.0 fL   MCH 29.6 26.0 - 34.0 pg   MCHC 31.7 30.0 - 36.0 g/dL   RDW 13.2 11.5 - 15.5 %   Platelets 277 150 - 400 K/uL   nRBC 0.0 0.0 - 0.2 %   Neutrophils Relative % 52 %   Neutro Abs 2.7 1.7 - 7.7 K/uL   Lymphocytes Relative 31 %   Lymphs Abs 1.6 0.7 - 4.0 K/uL   Monocytes Relative 11 %   Monocytes Absolute 0.5 0 - 1 K/uL   Eosinophils Relative 5 %   Eosinophils Absolute 0.3 0 - 0 K/uL   Basophils Relative 1 %   Basophils Absolute 0.1 0 - 0 K/uL   Immature Granulocytes 0 %   Abs Immature Granulocytes 0.01 0.00 - 0.07 K/uL  Magnesium     Status: None   Collection Time: 11/03/19  3:27 AM  Result Value Ref Range   Magnesium 1.8 1.7 - 2.4 mg/dL    No results found for this or any previous visit (from the past 240 hour(s)).   Radiology Studies: No results found. VAS [place] UPPER EXTREMITY VENOUS DUPLEX  Final Result    DG C-Arm 1-60 Min  Final Result    DG Foot Complete Left  Final Result    [place] EKG SITE RITE  Final Result    [place] EKG SITE RITE  Final Result    MR FOOT LEFT WO CONTRAST  Final Result    DG Foot 2 Views Left  Final Result       Scheduled Meds: . vitamin C  500 mg Oral Daily  . aspirin EC  81 mg Oral Daily  . brimonidine  1 drop Both Eyes BID  . Chlorhexidine Gluconate  Cloth  6 each Topical Daily  . diclofenac Sodium  2 g Topical QID  . dorzolamide-timolol  1 drop Both Eyes BID  . enoxaparin (LOVENOX) injection  40 mg Subcutaneous Q24H  . ferrous sulfate  325 mg Oral Q breakfast  . gabapentin  300 mg Oral q morning - 10a   And  . gabapentin  600 mg Oral QHS  . latanoprost  1 drop Both Eyes QHS  . lisinopril  2.5 mg Oral Daily  . methocarbamol  500 mg Oral q1600  . multivitamin with minerals  1 tablet Oral Daily  . omega-3 acid  ethyl esters  1 g Oral Daily  . oxyCODONE  10 mg Oral Q12H  . sodium chloride flush  10-40 mL Intracatheter Q12H   PRN Meds: HYDROcodone-acetaminophen, ibuprofen, lidocaine, ondansetron, polyethylene glycol, sodium chloride flush Continuous Infusions: . ertapenem 1,000 mg (11/03/19 1356)      LOS: 20 days  Time spent: Greater than 50% of the 35 minute visit was spent in counseling/coordination of care for the patient as laid out in the A&P.   Dwyane Dee, MD [facility] 11/03/2019, 5:26 PM   Contact via secure chat.  To contact the attending provider between 7A-7P or the covering provider during after hours 7P-7A, please log into the web site [email or web address] and access using universal [place] password for that web site. If you do not have the password, please call the hospital operator.

## 2019-11-03 NOTE — Plan of Care (Signed)

## 2019-11-04 DIAGNOSIS — L02612 Cutaneous abscess of left foot: Secondary | ICD-10-CM | POA: Diagnosis not present

## 2019-11-04 LAB — CBC WITH DIFFERENTIAL/PLATELET
Abs Immature Granulocytes: 0.02 10*3/uL (ref 0.00–0.07)
Basophils Absolute: 0.1 10*3/uL (ref 0.0–0.1)
Basophils Relative: 1 %
Eosinophils Absolute: 0.2 10*3/uL (ref 0.0–0.5)
Eosinophils Relative: 5 %
HCT: 31.2 % — ABNORMAL LOW (ref 36.0–46.0)
Hemoglobin: 10 g/dL — ABNORMAL LOW (ref 12.0–15.0)
Immature Granulocytes: 0 %
Lymphocytes Relative: 34 %
Lymphs Abs: 1.7 10*3/uL (ref 0.7–4.0)
MCH: 30.1 pg (ref 26.0–34.0)
MCHC: 32.1 g/dL (ref 30.0–36.0)
MCV: 94 fL (ref 80.0–100.0)
Monocytes Absolute: 0.5 10*3/uL (ref 0.1–1.0)
Monocytes Relative: 10 %
Neutro Abs: 2.6 10*3/uL (ref 1.7–7.7)
Neutrophils Relative %: 50 %
Platelets: 268 10*3/uL (ref 150–400)
RBC: 3.32 MIL/uL — ABNORMAL LOW (ref 3.87–5.11)
RDW: 13.4 % (ref 11.5–15.5)
WBC: 5.2 10*3/uL (ref 4.0–10.5)
nRBC: 0 % (ref 0.0–0.2)

## 2019-11-04 LAB — BASIC METABOLIC PANEL
Anion gap: 7 (ref 5–15)
BUN: 11 mg/dL (ref 8–23)
CO2: 25 mmol/L (ref 22–32)
Calcium: 9 mg/dL (ref 8.9–10.3)
Chloride: 106 mmol/L (ref 98–111)
Creatinine, Ser: 0.72 mg/dL (ref 0.44–1.00)
GFR calc Af Amer: >60 mL/min (ref >60)
GFR calc non Af Amer: >60 mL/min (ref >60)
Glucose, Bld: 91 mg/dL (ref 70–99)
Potassium: 3.8 mmol/L (ref 3.5–5.1)
Sodium: 138 mmol/L (ref 135–145)

## 2019-11-04 LAB — MAGNESIUM: Magnesium: 1.7 mg/dL (ref 1.7–2.4)

## 2019-11-04 MED ORDER — MAGNESIUM OXIDE 400 (241.3 MG) MG PO TABS
800.0000 mg | ORAL_TABLET | Freq: Once | ORAL | Status: AC
  Administered 2019-11-04: 800 mg via ORAL
  Filled 2019-11-04: qty 2

## 2019-11-04 NOTE — Plan of Care (Signed)

## 2019-11-04 NOTE — Progress Notes (Signed)
PROGRESS NOTE    Alexis Ball   NTI:[id number]  DOB: 02-Dec-1955  DOA: 10/13/2019     21  PCP: Alexis Lei, MD  CC: foot infection  Hospital Course: Alexis Ball is a 64 yo AAF with PMH hypertension, remote CVA, recent bunionectomy with silicone implant of her left foot on June 25. She presented to the ED on 7/3 with worsening pain and failed outpatient antibiotic therapy. As per report, her wound was open with maggots coming out.   Admitted to the hospital and underwent surgical debridement and external fixation.   She is currently on ertapenem until 11/28/19 per ID PICC in place and is awaiting dispo to SNF when available.    Interval History:  No events overnight. Resting in bed in no distress. Still complaining of some mild pain/spasms in her toe but otherwise is doing okay. Still awaiting discharge to rehab.  Old records reviewed in assessment of this patient  ROS: Constitutional: negative for chills and fevers, Respiratory: negative for cough and dyspnea on exertion, Cardiovascular: negative for chest pain and Gastrointestinal: negative for abdominal pain and constipation  Assessment & Plan: Foot abscess, left -7/4, incision and drainage by Dr. March Rummage -7/7, debridement and external fixation. Removal of Silicone Implant; Insertion of Antibiotic Spacer; Application of External Fixator; Application of Wound VAC (Left)  -Wound culture positive for Enterobacter cloacae and Finegoldia Magna, initially treated with multiple antibiotics, now remains on ertapenem until 8/18 through her PICC line as per ID recommendation. -Clinically improving. Wound VAC in place. Skilled nursing facility for skilled care. UPON DISCHARGE: Remove Vac machinhe and dressing and apply Xeroform gauze over foot wounds Q day then cover with gauze and ace wrap.  Abscess of bursa of left foot - see foot abscess  Wound infection - see foot abscess  Superficial venous thrombosis of arm, right - -7/21,  ultrasound duplex of right upper extremity was obtained because of pain and swelling.  DVT ruled out.  Noted superficial vein thrombosis on cephalic vein.  Voltaren gel ordered for pain control.  Okay to continue to use PICC line.    Hypertension - continue current regimen   Antimicrobials: Ertapenem until 11/28/19 per ID  DVT prophylaxis: Lovenox Code Status: Full Family Communication: none present Disposition Plan:  Status is: Inpatient  Remains inpatient appropriate because:Ongoing active pain requiring inpatient pain management, IV treatments appropriate due to intensity of illness or inability to take PO and Inpatient level of care appropriate due to severity of illness   Dispo:  Patient From: Home  Planned Disposition: [place]  Expected discharge date: 10/26/19  Medically stable for discharge: Yes   Objective: Blood pressure 127/68, pulse 67, temperature 98.8 F (37.1 C), temperature source Oral, resp. rate 17, height 5\' 3"  (1.6 m), weight 82.1 kg, SpO2 98 %.  Examination: General appearance: alert, cooperative and no distress Head: Normocephalic, without obvious abnormality, atraumatic Eyes: EOMI Lungs: clear to auscultation bilaterally Heart: regular rate and rhythm and S1, S2 normal Abdomen: normal findings: bowel sounds normal and soft, non-tender Extremities: left foot in boot with fixation nails in place and [place] sealed in place Skin: mobility and turgor normal Neurologic: Grossly normal   Consultants:   Ortho surgery  Procedures:   7/4: I&D, bone bx 7/9: Debridement and irrigation of left foot wound, Removal of Hardware, Application of Multiplanar External Fixator, Insertion of Abx delivery Device, Application wound VAC  Data Reviewed: I have personally reviewed following labs and imaging studies Results for orders placed or  performed during the hospital encounter of 10/13/19 (from the past 24 hour(s))  Basic metabolic panel     Status:  None   Collection Time: 11/04/19  3:49 AM  Result Value Ref Range   Sodium 138 135 - 145 mmol/L   Potassium 3.8 3.5 - 5.1 mmol/L   Chloride 106 98 - 111 mmol/L   CO2 25 22 - 32 mmol/L   Glucose, Bld 91 70 - 99 mg/dL   BUN 11 8 - 23 mg/dL   Creatinine, Ser 0.72 0.44 - 1.00 mg/dL   Calcium 9.0 8.9 - 10.3 mg/dL   GFR calc non Af Amer >60 >60 mL/min   GFR calc Af Amer >60 >60 mL/min   Anion gap 7 5 - 15  CBC with Differential/Platelet     Status: Abnormal   Collection Time: 11/04/19  3:49 AM  Result Value Ref Range   WBC 5.2 4.0 - 10.5 K/uL   RBC 3.32 (L) 3.87 - 5.11 MIL/uL   Hemoglobin 10.0 (L) 12.0 - 15.0 g/dL   HCT 31.2 (L) 36 - 46 %   MCV 94.0 80.0 - 100.0 fL   MCH 30.1 26.0 - 34.0 pg   MCHC 32.1 30.0 - 36.0 g/dL   RDW 13.4 11.5 - 15.5 %   Platelets 268 150 - 400 K/uL   nRBC 0.0 0.0 - 0.2 %   Neutrophils Relative % 50 %   Neutro Abs 2.6 1.7 - 7.7 K/uL   Lymphocytes Relative 34 %   Lymphs Abs 1.7 0.7 - 4.0 K/uL   Monocytes Relative 10 %   Monocytes Absolute 0.5 0 - 1 K/uL   Eosinophils Relative 5 %   Eosinophils Absolute 0.2 0 - 0 K/uL   Basophils Relative 1 %   Basophils Absolute 0.1 0 - 0 K/uL   Immature Granulocytes 0 %   Abs Immature Granulocytes 0.02 0.00 - 0.07 K/uL  Magnesium     Status: None   Collection Time: 11/04/19  3:49 AM  Result Value Ref Range   Magnesium 1.7 1.7 - 2.4 mg/dL    No results found for this or any previous visit (from the past 240 hour(s)).   Radiology Studies: No results found. VAS [place] UPPER EXTREMITY VENOUS DUPLEX  Final Result    DG C-Arm 1-60 Min  Final Result    DG Foot Complete Left  Final Result    [place] EKG SITE RITE  Final Result    [place] EKG SITE RITE  Final Result    MR FOOT LEFT WO CONTRAST  Final Result    DG Foot 2 Views Left  Final Result       Scheduled Meds: . vitamin C  500 mg Oral Daily  . aspirin EC  81 mg Oral Daily  . brimonidine  1 drop Both Eyes BID  . Chlorhexidine Gluconate Cloth  6 each Topical  Daily  . diclofenac Sodium  2 g Topical QID  . dorzolamide-timolol  1 drop Both Eyes BID  . enoxaparin (LOVENOX) injection  40 mg Subcutaneous Q24H  . ferrous sulfate  325 mg Oral Q breakfast  . gabapentin  300 mg Oral q morning - 10a   And  . gabapentin  600 mg Oral QHS  . latanoprost  1 drop Both Eyes QHS  . lisinopril  2.5 mg Oral Daily  . methocarbamol  500 mg Oral q1600  . multivitamin with minerals  1 tablet Oral Daily  . omega-3 acid ethyl esters  1 g  Oral Daily  . oxyCODONE  10 mg Oral Q12H  . sodium chloride flush  10-40 mL Intracatheter Q12H   PRN Meds: HYDROcodone-acetaminophen, ibuprofen, lidocaine, ondansetron, polyethylene glycol, sodium chloride flush Continuous Infusions: . ertapenem 1,000 mg (11/03/19 1356)      LOS: 21 days  Time spent: Greater than 50% of the 35 minute visit was spent in counseling/coordination of care for the patient as laid out in the A&P.   Dwyane Dee, MD [facility] 11/04/2019, 1:04 PM   Contact via secure chat.  To contact the attending provider between 7A-7P or the covering provider during after hours 7P-7A, please log into the web site [email or web address] and access using universal [place] password for that web site. If you do not have the password, please call the hospital operator.

## 2019-11-05 DIAGNOSIS — L02612 Cutaneous abscess of left foot: Secondary | ICD-10-CM | POA: Diagnosis not present

## 2019-11-05 LAB — CBC WITH DIFFERENTIAL/PLATELET
Abs Immature Granulocytes: 0.01 10*3/uL (ref 0.00–0.07)
Basophils Absolute: 0.1 10*3/uL (ref 0.0–0.1)
Basophils Relative: 1 %
Eosinophils Absolute: 0.3 10*3/uL (ref 0.0–0.5)
Eosinophils Relative: 5 %
HCT: 31.7 % — ABNORMAL LOW (ref 36.0–46.0)
Hemoglobin: 10 g/dL — ABNORMAL LOW (ref 12.0–15.0)
Immature Granulocytes: 0 %
Lymphocytes Relative: 33 %
Lymphs Abs: 1.6 10*3/uL (ref 0.7–4.0)
MCH: 29.4 pg (ref 26.0–34.0)
MCHC: 31.5 g/dL (ref 30.0–36.0)
MCV: 93.2 fL (ref 80.0–100.0)
Monocytes Absolute: 0.5 10*3/uL (ref 0.1–1.0)
Monocytes Relative: 11 %
Neutro Abs: 2.3 10*3/uL (ref 1.7–7.7)
Neutrophils Relative %: 50 %
Platelets: 257 10*3/uL (ref 150–400)
RBC: 3.4 MIL/uL — ABNORMAL LOW (ref 3.87–5.11)
RDW: 13.5 % (ref 11.5–15.5)
WBC: 4.7 10*3/uL (ref 4.0–10.5)
nRBC: 0 % (ref 0.0–0.2)

## 2019-11-05 LAB — MAGNESIUM: Magnesium: 1.7 mg/dL (ref 1.7–2.4)

## 2019-11-05 LAB — BASIC METABOLIC PANEL
Anion gap: 7 (ref 5–15)
BUN: 12 mg/dL (ref 8–23)
CO2: 24 mmol/L (ref 22–32)
Calcium: 9 mg/dL (ref 8.9–10.3)
Chloride: 106 mmol/L (ref 98–111)
Creatinine, Ser: 0.7 mg/dL (ref 0.44–1.00)
GFR calc Af Amer: >60 mL/min (ref >60)
GFR calc non Af Amer: >60 mL/min (ref >60)
Glucose, Bld: 102 mg/dL — ABNORMAL HIGH (ref 70–99)
Potassium: 4 mmol/L (ref 3.5–5.1)
Sodium: 137 mmol/L (ref 135–145)

## 2019-11-05 MED ORDER — SENNOSIDES-DOCUSATE SODIUM 8.6-50 MG PO TABS
1.0000 | ORAL_TABLET | Freq: Two times a day (BID) | ORAL | Status: DC
  Administered 2019-11-05 – 2019-11-09 (×10): 1 via ORAL
  Filled 2019-11-05 (×10): qty 1

## 2019-11-05 NOTE — Plan of Care (Signed)

## 2019-11-05 NOTE — Progress Notes (Signed)
PROGRESS NOTE    Alexis Ball   [id number]:[id number]  DOB: 1955/09/11  DOA: 10/13/2019     22  PCP: Lucianne Lei, MD  CC: foot infection  Hospital Course: [name] is a 64 yo AAF with PMH hypertension, remote CVA, recent bunionectomy with silicone implant of her left foot on June 25. She presented to the ED on 7/3 with worsening pain and failed outpatient antibiotic therapy. As per report, her wound was open with maggots coming out.   Admitted to the hospital and underwent surgical debridement and external fixation.   She is currently on ertapenem until 11/28/19 per ID PICC in place and is awaiting dispo to SNF when available.    Interval History:  No events overnight. Resting in bed in no distress. Dressing/[place] change this am; healing well. She has no concerns and is just awaiting discharge plans.   Old records reviewed in assessment of this patient  ROS: Constitutional: negative for chills and fevers, Respiratory: negative for cough and dyspnea on exertion, Cardiovascular: negative for chest pain and Gastrointestinal: negative for abdominal pain and constipation  Assessment & Plan: Foot abscess, left -7/4, incision and drainage by Dr. March Rummage -7/7, debridement and external fixation. Removal of Silicone Implant; Insertion of Antibiotic Spacer; Application of External Fixator; Application of Wound VAC (Left)  -Wound culture positive for Enterobacter cloacae and Finegoldia Magna, initially treated with multiple antibiotics, now remains on ertapenem until 8/18 through her PICC line as per ID recommendation. -Clinically improving. Wound VAC in place. Skilled nursing facility for skilled care. UPON DISCHARGE: Remove Vac machinhe and dressing and apply Xeroform gauze over foot wounds Q day then cover with gauze and ace wrap.  Abscess of bursa of left foot - see foot abscess  Wound infection - see foot abscess  Superficial venous thrombosis of arm, right - -7/21, ultrasound duplex  of right upper extremity was obtained because of pain and swelling.  DVT ruled out.  Noted superficial vein thrombosis on cephalic vein.  Voltaren gel ordered for pain control.  Okay to continue to use PICC line.    Hypertension - continue current regimen   Antimicrobials: Ertapenem until 11/28/19 per ID  DVT prophylaxis: Lovenox Code Status: Full Family Communication: none present Disposition Plan:  Status is: Inpatient  Remains inpatient appropriate because:Ongoing active pain requiring inpatient pain management, IV treatments appropriate due to intensity of illness or inability to take PO and Inpatient level of care appropriate due to severity of illness   Dispo:  Patient From: Home  Planned Disposition: [place]  Expected discharge date: 10/26/19  Medically stable for discharge: Yes   Objective: Blood pressure (!) 140/76, pulse 63, temperature 97.9 F (36.6 C), temperature source Oral, resp. rate 16, height 5\' 3"  (1.6 m), weight 82.1 kg, SpO2 100 %.  Examination: General appearance: alert, cooperative and no distress Head: Normocephalic, without obvious abnormality, atraumatic Eyes: EOMI Lungs: clear to auscultation bilaterally Heart: regular rate and rhythm and S1, S2 normal Abdomen: normal findings: bowel sounds normal and soft, non-tender Extremities: left foot in boot with fixation nails in place and Cloud sealed in place. See pic below from 7/26 dressing change Skin: mobility and turgor normal Neurologic: Grossly normal    Consultants:   Ortho surgery  Procedures:   7/4: I&D, bone bx 7/9: Debridement and irrigation of left foot wound, Removal of Hardware, Application of Multiplanar External Fixator, Insertion of Abx delivery Device, Application wound VAC  Data Reviewed: I have personally reviewed following labs and  imaging studies Results for orders placed or performed during the hospital encounter of 10/13/19 (from the past 24 hour(s))  Basic  metabolic panel     Status: Abnormal   Collection Time: 11/05/19  3:40 AM  Result Value Ref Range   Sodium 137 135 - 145 mmol/L   Potassium 4.0 3.5 - 5.1 mmol/L   Chloride 106 98 - 111 mmol/L   CO2 24 22 - 32 mmol/L   Glucose, Bld 102 (H) 70 - 99 mg/dL   BUN 12 8 - 23 mg/dL   Creatinine, Ser 0.70 0.44 - 1.00 mg/dL   Calcium 9.0 8.9 - 10.3 mg/dL   GFR calc non Af Amer >60 >60 mL/min   GFR calc Af Amer >60 >60 mL/min   Anion gap 7 5 - 15  CBC with Differential/Platelet     Status: Abnormal   Collection Time: 11/05/19  3:40 AM  Result Value Ref Range   WBC 4.7 4.0 - 10.5 K/uL   RBC 3.40 (L) 3.87 - 5.11 MIL/uL   Hemoglobin 10.0 (L) 12.0 - 15.0 g/dL   HCT 31.7 (L) 36 - 46 %   MCV 93.2 80.0 - 100.0 fL   MCH 29.4 26.0 - 34.0 pg   MCHC 31.5 30.0 - 36.0 g/dL   RDW 13.5 11.5 - 15.5 %   Platelets 257 150 - 400 K/uL   nRBC 0.0 0.0 - 0.2 %   Neutrophils Relative % 50 %   Neutro Abs 2.3 1.7 - 7.7 K/uL   Lymphocytes Relative 33 %   Lymphs Abs 1.6 0.7 - 4.0 K/uL   Monocytes Relative 11 %   Monocytes Absolute 0.5 0 - 1 K/uL   Eosinophils Relative 5 %   Eosinophils Absolute 0.3 0 - 0 K/uL   Basophils Relative 1 %   Basophils Absolute 0.1 0 - 0 K/uL   Immature Granulocytes 0 %   Abs Immature Granulocytes 0.01 0.00 - 0.07 K/uL  Magnesium     Status: None   Collection Time: 11/05/19  3:40 AM  Result Value Ref Range   Magnesium 1.7 1.7 - 2.4 mg/dL    No results found for this or any previous visit (from the past 240 hour(s)).   Radiology Studies: No results found. VAS [place] UPPER EXTREMITY VENOUS DUPLEX  Final Result    DG C-Arm 1-60 Min  Final Result    DG Foot Complete Left  Final Result    [place] EKG SITE RITE  Final Result    [place] EKG SITE RITE  Final Result    MR FOOT LEFT WO CONTRAST  Final Result    DG Foot 2 Views Left  Final Result       Scheduled Meds: . vitamin C  500 mg Oral Daily  . aspirin EC  81 mg Oral Daily  . brimonidine  1 drop Both Eyes BID  .  Chlorhexidine Gluconate Cloth  6 each Topical Daily  . diclofenac Sodium  2 g Topical QID  . dorzolamide-timolol  1 drop Both Eyes BID  . enoxaparin (LOVENOX) injection  40 mg Subcutaneous Q24H  . ferrous sulfate  325 mg Oral Q breakfast  . gabapentin  300 mg Oral q morning - 10a   And  . gabapentin  600 mg Oral QHS  . latanoprost  1 drop Both Eyes QHS  . lisinopril  2.5 mg Oral Daily  . methocarbamol  500 mg Oral q1600  . multivitamin with minerals  1 tablet Oral Daily  .  omega-3 acid ethyl esters  1 g Oral Daily  . oxyCODONE  10 mg Oral Q12H  . sodium chloride flush  10-40 mL Intracatheter Q12H   PRN Meds: HYDROcodone-acetaminophen, ibuprofen, lidocaine, ondansetron, polyethylene glycol, sodium chloride flush Continuous Infusions: . ertapenem 1,000 mg (11/04/19 1452)      LOS: 22 days  Time spent: Greater than 50% of the 35 minute visit was spent in counseling/coordination of care for the patient as laid out in the A&P.   Dwyane Dee, MD [facility] 11/05/2019, 9:50 AM   Contact via secure chat.  To contact the attending provider between 7A-7P or the covering provider during after hours 7P-7A, please log into the web site [email or web address] and access using universal [place] password for that web site. If you do not have the password, please call the hospital operator.

## 2019-11-05 NOTE — Plan of Care (Signed)
  Problem: Pain Managment: Goal: General experience of comfort will improve Outcome: Progressing   Problem: Safety: Goal: Ability to remain free from injury will improve Outcome: Progressing   Problem: Skin Integrity: Goal: Risk for impaired skin integrity will decrease Outcome: Progressing   

## 2019-11-05 NOTE — Consult Note (Signed)
Dressingchangeto left foot. Pt had surgery and is followed by podiatry for plan of care.  External fixator and 2 full thickness post-op wounds to left foot.  Measurement:Pt tolerated without c/o pain. Anterior foot with red moist wounds;beefy red separated by narrow bridge of skin,visible tendon near great toe.Applied a barrier ring to attempt to maintain a seal aound the edges, then Mepitel and 2 small pieces black foam and covered with drape and attached to 44mm cont suction. Small amt pink drainage, applied ace wrap. Heel floated to reduce pressure. Dressing procedure/placement/frequency: Podiatry has provided the following orders: Bedside nurse: remove wound Vac for d/c. Apply Aquacel over wounds, followed by 4x4, kerlix, ACE bandage. Moisten Aquacel with NS each time to assist with removal. Julien Girt MSN, RN, [place], [place], [place]

## 2019-11-05 NOTE — Progress Notes (Signed)
Physical Therapy Treatment Patient Details Name: Alexis Ball MRN: [id number] DOB: 17-Oct-1955 Today's Date: 11/05/2019    History of Present Illness Pt is a 64 yo female presenting s/p surgical drainage of L foot infection 7/4 with bone biopsy due to concern for possible osteomyelitis after bunionectomy with silicone implant on 4/62. Further examination reports osteomyelitis with infection of hardware.  Pt s/p I&D with hard ware removal/silicon implant removal and now ex fix and antibiotic spacer placed.  Surgeon also placed wound vac after procedure.   PMH includes: HTN, and stroke.    PT Comments    Pt tolerating mobility well despite pain in L foot. Questionable if patient is adherence to L foot WBAT through heel in CAM boot only. Pt eager to move. Pt will need ST-SNF upon d/c to achieve safe mod I level of function for safe transition home alone.    Follow Up Recommendations  SNF     Equipment Recommendations  None recommended by PT    Recommendations for Other Services       Precautions / Restrictions Precautions Precautions: Fall Required Braces or Orthoses: Other Brace Other Brace: CAM boot LLE over ex fix Restrictions Weight Bearing Restrictions: Yes LLE Weight Bearing: Partial weight bearing LLE Partial Weight Bearing Percentage or Pounds: WBing through L heel in CAM Boot    Mobility  Bed Mobility Overal bed mobility: Needs Assistance Bed Mobility: Supine to Sit     Supine to sit: HOB elevated;Supervision     General bed mobility comments: pt able to manage LEs and elevate trunk to EOB wtihout physical assist  Transfers Overall transfer level: Needs assistance Equipment used: Rolling walker (2 wheeled) Transfers: Sit to/from Stand Sit to Stand: Min guard         General transfer comment: verbal cues to adhere to L LE WBing through heel only, good technique with hand placement  Ambulation/Gait Ambulation/Gait assistance: Min guard Gait Distance  (Feet): 100 Feet Assistive device: Rolling walker (2 wheeled) Gait Pattern/deviations: Step-through pattern;Decreased stride length Gait velocity: Decreased Gait velocity interpretation: <1.31 ft/sec, indicative of household ambulator General Gait Details: unsure of pt's compliance with L LE WBIng through heal, pt states she is however looks as if patient was rolling up on toes of foot   Stairs             Wheelchair Mobility    Modified Rankin (Stroke Patients Only)       Balance Overall balance assessment: Needs assistance Sitting-balance support: No upper extremity supported;Feet supported Sitting balance-Leahy Scale: Good     Standing balance support: During functional activity;Single extremity supported;No upper extremity supported Standing balance-Leahy Scale: Poor Standing balance comment: Reliant on UE support                            Cognition Arousal/Alertness: Awake/alert Behavior During Therapy: WFL for tasks assessed/performed Overall Cognitive Status: Within Functional Limits for tasks assessed                                 General Comments: pt with good recall of WBing status however unsure of compliance      Exercises      General Comments General comments (skin integrity, edema, etc.): pt assisted to the bathroom, pt independent for hygiene      Pertinent Vitals/Pain Pain Assessment: 0-10 Pain Score: 6  Pain Location: L foot Pain  Descriptors / Indicators: Pins and needles Pain Intervention(s): Monitored during session    Home Living                      Prior Function            PT Goals (current goals can now be found in the care plan section) Progress towards PT goals: Progressing toward goals    Frequency    Min 3X/week      PT Plan Current plan remains appropriate    Co-evaluation              AM-PAC PT "6 Clicks" Mobility   Outcome Measure  Help needed turning from your  back to your side while in a flat bed without using bedrails?: None Help needed moving from lying on your back to sitting on the side of a flat bed without using bedrails?: None Help needed moving to and from a bed to a chair (including a wheelchair)?: A Little Help needed standing up from a chair using your arms (e.g., wheelchair or bedside chair)?: A Little Help needed to walk in hospital room?: A Little Help needed climbing 3-5 steps with a railing? : A Lot 6 Click Score: 19    End of Session Equipment Utilized During Treatment: Gait belt Activity Tolerance: Patient tolerated treatment well Patient left: in chair;with call bell/phone within reach Nurse Communication: Mobility status PT Visit Diagnosis: Other abnormalities of gait and mobility (R26.89);Muscle weakness (generalized) (M62.81);Pain Pain - Right/Left: Left Pain - part of body: Ankle and joints of foot     Time: 0981-1914 PT Time Calculation (min) (ACUTE ONLY): 35 min  Charges:  $Gait Training: 8-22 mins $Therapeutic Activity: 8-22 mins                     Alexis Ball, PT, DPT Acute Rehabilitation Services Pager #: [phone] Office #: [phone]    Alexis Ball 11/05/2019, 2:38 PM

## 2019-11-06 DIAGNOSIS — T148XXA Other injury of unspecified body region, initial encounter: Secondary | ICD-10-CM | POA: Diagnosis not present

## 2019-11-06 DIAGNOSIS — L089 Local infection of the skin and subcutaneous tissue, unspecified: Secondary | ICD-10-CM | POA: Diagnosis not present

## 2019-11-06 DIAGNOSIS — L02612 Cutaneous abscess of left foot: Secondary | ICD-10-CM | POA: Diagnosis not present

## 2019-11-06 LAB — CBC WITH DIFFERENTIAL/PLATELET
Abs Immature Granulocytes: 0.01 10*3/uL (ref 0.00–0.07)
Basophils Absolute: 0.1 10*3/uL (ref 0.0–0.1)
Basophils Relative: 1 %
Eosinophils Absolute: 0.3 10*3/uL (ref 0.0–0.5)
Eosinophils Relative: 5 %
HCT: 30.7 % — ABNORMAL LOW (ref 36.0–46.0)
Hemoglobin: 9.8 g/dL — ABNORMAL LOW (ref 12.0–15.0)
Immature Granulocytes: 0 %
Lymphocytes Relative: 27 %
Lymphs Abs: 1.6 10*3/uL (ref 0.7–4.0)
MCH: 29.5 pg (ref 26.0–34.0)
MCHC: 31.9 g/dL (ref 30.0–36.0)
MCV: 92.5 fL (ref 80.0–100.0)
Monocytes Absolute: 0.5 10*3/uL (ref 0.1–1.0)
Monocytes Relative: 8 %
Neutro Abs: 3.6 10*3/uL (ref 1.7–7.7)
Neutrophils Relative %: 59 %
Platelets: 239 10*3/uL (ref 150–400)
RBC: 3.32 MIL/uL — ABNORMAL LOW (ref 3.87–5.11)
RDW: 13.2 % (ref 11.5–15.5)
WBC: 6 10*3/uL (ref 4.0–10.5)
nRBC: 0 % (ref 0.0–0.2)

## 2019-11-06 LAB — BASIC METABOLIC PANEL
Anion gap: 6 (ref 5–15)
BUN: 12 mg/dL (ref 8–23)
CO2: 25 mmol/L (ref 22–32)
Calcium: 9 mg/dL (ref 8.9–10.3)
Chloride: 108 mmol/L (ref 98–111)
Creatinine, Ser: 0.92 mg/dL (ref 0.44–1.00)
GFR calc Af Amer: >60 mL/min (ref >60)
GFR calc non Af Amer: >60 mL/min (ref >60)
Glucose, Bld: 104 mg/dL — ABNORMAL HIGH (ref 70–99)
Potassium: 3.9 mmol/L (ref 3.5–5.1)
Sodium: 139 mmol/L (ref 135–145)

## 2019-11-06 LAB — MAGNESIUM: Magnesium: 1.6 mg/dL — ABNORMAL LOW (ref 1.7–2.4)

## 2019-11-06 MED ORDER — NYSTATIN 100000 UNIT/GM EX CREA
TOPICAL_CREAM | Freq: Two times a day (BID) | CUTANEOUS | Status: DC
  Administered 2019-11-07: 1 via TOPICAL
  Filled 2019-11-06: qty 15

## 2019-11-06 NOTE — Plan of Care (Signed)
  Problem: Education: Goal: Knowledge of General Education information will improve Description: Including pain rating scale, medication(s)/side effects and non-pharmacologic comfort measures Outcome: Adequate for Discharge   Problem: Health Behavior/Discharge Planning: Goal: Ability to manage health-related needs will improve Outcome: Adequate for Discharge   Problem: Clinical Measurements: Goal: Ability to maintain clinical measurements within normal limits will improve Outcome: Adequate for Discharge Goal: Will remain free from infection Outcome: Adequate for Discharge   

## 2019-11-06 NOTE — Progress Notes (Signed)
PROGRESS NOTE    Alexis Ball   [id number]  DOB: December 24, 1955  DOA: 10/13/2019     23  PCP: Alexis Lei, MD  CC: foot infection  Hospital Course: [name] is a 64 yo AAF with HTN, cerebrovascular disease status post remote CVA who was admitted 7/3 with worsening pain on left foot after recent bunionectomy with a silicone implant on June 23.  Hypertension, remote CVA, recent bunionectomy with silicone implant of her left foot on June 25. In the ED she was noted to have maggots coming out of the wound.  She had been on outpatient antibiotics but had failed therapy.  She was admitted for IV antibiotics and surgical debridement which was done on 10/14/2019 and again on 10/17/2019 when an external fixator was placed and the silicone implant was removed. Wound culture was positive for Enterobacter cloacae in [place] DM magna and patient was placed on ertapenem which is to continue till 11/28/19 via PICC per ID. PICC in place and is awaiting dispo to SNF when available.   Interval History:   Patient states that she is feeling well.  Feels that her leg is healing and she has much less pain.  Her only concern is some itching around her groin.  Notes that her daughter has been bringing in "triple antibiotic cream" but it is not helping.  We discussed that significant portion of population is allergic to polymyxin and triple antibiotic cream is not a great idea.  She states she likes the cream.  I asked if she would prefer nystatin cream versus powder and she said she would.  Otherwise patient is doing well.  No chest pain, shortness of breath.  Patient denies abdominal pain constipation.  No dysuria. Old records reviewed in assessment of this patient  Assessment & Plan: Abscess of bursa of left foot. Patient continues to improve on present management. Continue ertapenem for Enterobacter cloacae and Fingoldia magna from wound culture. Patient is to stay on ertapenem until 818 through PICC line  per ID recommendations. Continue wound care with wound VAC.  Previous management: -7/4, incision and drainage by Dr. March Rummage -7/7, debridement and external fixation. Removal of Silicone Implant; Insertion of Antibiotic Spacer; Application of External Fixator; Application of Wound VAC (Left)  -Wound culture positive for Enterobacter cloacae and Finegoldia Magna, initially treated with multiple antibiotics, now remains on ertapenem until 8/18 through her PICC line as per ID recommendation. -Clinically improving. Wound VAC in place. Skilled nursing facility for skilled care. UPON DISCHARGE: Remove Vac machinhe and dressing and apply Xeroform gauze over foot wounds Q day then cover with gauze and ace wrap.   Antimicrobials: Ertapenem until 11/28/19 per ID  DVT prophylaxis: Lovenox Code Status: Full Family Communication: none present Disposition Plan:  Status is: Inpatient  Remains inpatient appropriate because:Ongoing active pain requiring inpatient pain management, IV treatments appropriate due to intensity of illness or inability to take PO and Inpatient level of care appropriate due to severity of illness   Dispo:  Patient From: Home  Planned Disposition: [place]  Expected discharge date: 10/26/19  Medically stable for discharge: Yes   Objective: Blood pressure (!) 119/62, pulse 73, temperature 98.2 F (36.8 C), temperature source Oral, resp. rate 17, height 5\' 3"  (1.6 m), weight 82.1 kg, SpO2 99 %.  Examination: General appearance: alert, cooperative and no distress Head: Normocephalic, without obvious abnormality, atraumatic Eyes: EOMI Lungs: clear to auscultation bilaterally Heart: regular rate and rhythm and S1, S2 normal Abdomen: normal findings:  bowel sounds normal and soft, non-tender Extremities: Left foot is dressed with fixator visible.  Toes are warm.  See pic below from 7/26 dressing change Skin: mobility and turgor normal Neurologic: Grossly  normal    Consultants:   Ortho surgery  Procedures:   7/4: I&D, bone bx 7/9: Debridement and irrigation of left foot wound, Removal of Hardware, Application of Multiplanar External Fixator, Insertion of Abx delivery Device, Application wound VAC  Data Reviewed: I have personally reviewed following labs and imaging studies Results for orders placed or performed during the hospital encounter of 10/13/19 (from the past 24 hour(s))  Basic metabolic panel     Status: Abnormal   Collection Time: 11/06/19  4:04 AM  Result Value Ref Range   Sodium 139 135 - 145 mmol/L   Potassium 3.9 3.5 - 5.1 mmol/L   Chloride 108 98 - 111 mmol/L   CO2 25 22 - 32 mmol/L   Glucose, Bld 104 (H) 70 - 99 mg/dL   BUN 12 8 - 23 mg/dL   Creatinine, Ser 0.92 0.44 - 1.00 mg/dL   Calcium 9.0 8.9 - 10.3 mg/dL   GFR calc non Af Amer >60 >60 mL/min   GFR calc Af Amer >60 >60 mL/min   Anion gap 6 5 - 15  CBC with Differential/Platelet     Status: Abnormal   Collection Time: 11/06/19  4:04 AM  Result Value Ref Range   WBC 6.0 4.0 - 10.5 K/uL   RBC 3.32 (L) 3.87 - 5.11 MIL/uL   Hemoglobin 9.8 (L) 12.0 - 15.0 g/dL   HCT 30.7 (L) 36 - 46 %   MCV 92.5 80.0 - 100.0 fL   MCH 29.5 26.0 - 34.0 pg   MCHC 31.9 30.0 - 36.0 g/dL   RDW 13.2 11.5 - 15.5 %   Platelets 239 150 - 400 K/uL   nRBC 0.0 0.0 - 0.2 %   Neutrophils Relative % 59 %   Neutro Abs 3.6 1.7 - 7.7 K/uL   Lymphocytes Relative 27 %   Lymphs Abs 1.6 0.7 - 4.0 K/uL   Monocytes Relative 8 %   Monocytes Absolute 0.5 0 - 1 K/uL   Eosinophils Relative 5 %   Eosinophils Absolute 0.3 0 - 0 K/uL   Basophils Relative 1 %   Basophils Absolute 0.1 0 - 0 K/uL   Immature Granulocytes 0 %   Abs Immature Granulocytes 0.01 0.00 - 0.07 K/uL  Magnesium     Status: Abnormal   Collection Time: 11/06/19  4:04 AM  Result Value Ref Range   Magnesium 1.6 (L) 1.7 - 2.4 mg/dL    No results found for this or any previous visit (from the past 240 hour(s)).   Radiology  Studies: No results found. VAS [place] UPPER EXTREMITY VENOUS DUPLEX  Final Result    DG C-Arm 1-60 Min  Final Result    DG Foot Complete Left  Final Result    [place] EKG SITE RITE  Final Result    [place] EKG SITE RITE  Final Result    MR FOOT LEFT WO CONTRAST  Final Result    DG Foot 2 Views Left  Final Result       Scheduled Meds: . vitamin C  500 mg Oral Daily  . aspirin EC  81 mg Oral Daily  . brimonidine  1 drop Both Eyes BID  . Chlorhexidine Gluconate Cloth  6 each Topical Daily  . diclofenac Sodium  2 g Topical QID  .  dorzolamide-timolol  1 drop Both Eyes BID  . enoxaparin (LOVENOX) injection  40 mg Subcutaneous Q24H  . ferrous sulfate  325 mg Oral Q breakfast  . gabapentin  300 mg Oral q morning - 10a   And  . gabapentin  600 mg Oral QHS  . latanoprost  1 drop Both Eyes QHS  . lisinopril  2.5 mg Oral Daily  . methocarbamol  500 mg Oral q1600  . multivitamin with minerals  1 tablet Oral Daily  . omega-3 acid ethyl esters  1 g Oral Daily  . oxyCODONE  10 mg Oral Q12H  . senna-docusate  1 tablet Oral BID  . sodium chloride flush  10-40 mL Intracatheter Q12H   PRN Meds: HYDROcodone-acetaminophen, ibuprofen, lidocaine, ondansetron, polyethylene glycol, sodium chloride flush Continuous Infusions: . ertapenem Stopped (11/05/19 1422)      LOS: 23 days  Time spent: Greater than 50% of the 35 minute visit was spent in counseling/coordination of care for the patient as laid out in the A&P.   Vashti Hey, MD [facility] 11/06/2019, 11:19 AM   Contact via secure chat.  To contact the attending provider between 7A-7P or the covering provider during after hours 7P-7A, please log into the web site [email or web address] and access using universal [place] password for that web site. If you do not have the password, please call the hospital operator.

## 2019-11-06 NOTE — Plan of Care (Signed)
  Problem: Clinical Measurements: Goal: Ability to maintain clinical measurements within normal limits will improve Outcome: Progressing   Problem: Activity: Goal: Risk for activity intolerance will decrease Outcome: Progressing   Problem: Nutrition: Goal: Adequate nutrition will be maintained Outcome: Progressing   Problem: Elimination: Goal: Will not experience complications related to urinary retention Outcome: Progressing   Problem: Pain Managment: Goal: General experience of comfort will improve Outcome: Progressing   Problem: Safety: Goal: Ability to remain free from injury will improve Outcome: Progressing   Problem: Skin Integrity: Goal: Risk for impaired skin integrity will decrease Outcome: Progressing

## 2019-11-07 DIAGNOSIS — L02612 Cutaneous abscess of left foot: Secondary | ICD-10-CM | POA: Diagnosis not present

## 2019-11-07 LAB — BASIC METABOLIC PANEL
Anion gap: 7 (ref 5–15)
BUN: 10 mg/dL (ref 8–23)
CO2: 25 mmol/L (ref 22–32)
Calcium: 8.9 mg/dL (ref 8.9–10.3)
Chloride: 106 mmol/L (ref 98–111)
Creatinine, Ser: 0.65 mg/dL (ref 0.44–1.00)
GFR calc Af Amer: >60 mL/min (ref >60)
GFR calc non Af Amer: >60 mL/min (ref >60)
Glucose, Bld: 106 mg/dL — ABNORMAL HIGH (ref 70–99)
Potassium: 3.9 mmol/L (ref 3.5–5.1)
Sodium: 138 mmol/L (ref 135–145)

## 2019-11-07 LAB — CBC WITH DIFFERENTIAL/PLATELET
Abs Immature Granulocytes: 0.01 10*3/uL (ref 0.00–0.07)
Basophils Absolute: 0 10*3/uL (ref 0.0–0.1)
Basophils Relative: 1 %
Eosinophils Absolute: 0.3 10*3/uL (ref 0.0–0.5)
Eosinophils Relative: 6 %
HCT: 30.1 % — ABNORMAL LOW (ref 36.0–46.0)
Hemoglobin: 9.6 g/dL — ABNORMAL LOW (ref 12.0–15.0)
Immature Granulocytes: 0 %
Lymphocytes Relative: 32 %
Lymphs Abs: 1.5 10*3/uL (ref 0.7–4.0)
MCH: 29.4 pg (ref 26.0–34.0)
MCHC: 31.9 g/dL (ref 30.0–36.0)
MCV: 92.3 fL (ref 80.0–100.0)
Monocytes Absolute: 0.5 10*3/uL (ref 0.1–1.0)
Monocytes Relative: 11 %
Neutro Abs: 2.4 10*3/uL (ref 1.7–7.7)
Neutrophils Relative %: 50 %
Platelets: 245 10*3/uL (ref 150–400)
RBC: 3.26 MIL/uL — ABNORMAL LOW (ref 3.87–5.11)
RDW: 13.4 % (ref 11.5–15.5)
WBC: 4.8 10*3/uL (ref 4.0–10.5)
nRBC: 0 % (ref 0.0–0.2)

## 2019-11-07 LAB — MAGNESIUM: Magnesium: 1.8 mg/dL (ref 1.7–2.4)

## 2019-11-07 LAB — SARS CORONAVIRUS 2 BY RT PCR (HOSPITAL ORDER, PERFORMED IN ~~LOC~~ HOSPITAL LAB): SARS Coronavirus 2: NEGATIVE

## 2019-11-07 NOTE — Consult Note (Addendum)
Dressingchangeto left foot. Pt had surgery and is followed by podiatry for plan of care.  External fixator and 2 full thickness post-op wounds to left foot.  Outer foot 2X.8X.2cm, beefy red Inner foot 3.5X3X.2cm, beefy red Measurement:Pt tolerated without c/o pain. Anterior foot with red moist wounds;beefy redseparated by narrow bridge of skin,visible tendon near great toe.Applied a barrier ring to attempt to maintain a seal aound the edges, thenMepitel and 1 piece black foam and covered with drape and attached to 65mm cont suction. Small amt pink drainage, applied ace wrap. Heel floated to reduce pressure.  Dressing procedure/placement/frequency:Podiatry has provided the following orders: Bedside nurse: remove wound Vac for d/c. Apply Aquacel over wounds, followed by 4x4, kerlix, ACE bandage. Moisten Aquacel with NS each time to assist with removal.  Home Vac and supplies will need to be picked up by the company since it will not not be used after discharge, as originally anticipated. Julien Girt MSN, RN, [place], [place], [place]

## 2019-11-07 NOTE — Plan of Care (Signed)
  Problem: Health Behavior/Discharge Planning: Goal: Ability to manage health-related needs will improve Outcome: Progressing   Problem: Clinical Measurements: Goal: Ability to maintain clinical measurements within normal limits will improve Outcome: Progressing   Problem: Nutrition: Goal: Adequate nutrition will be maintained Outcome: Progressing   Problem: Coping: Goal: Level of anxiety will decrease Outcome: Progressing   Problem: Elimination: Goal: Will not experience complications related to urinary retention Outcome: Progressing   Problem: Pain Managment: Goal: General experience of comfort will improve Outcome: Progressing   Problem: Safety: Goal: Ability to remain free from injury will improve Outcome: Progressing   Problem: Skin Integrity: Goal: Risk for impaired skin integrity will decrease Outcome: Progressing

## 2019-11-07 NOTE — TOC Progression Note (Signed)
Transition of Care [facility]) - Progression Note    Patient Details  Name: Alexis Ball MRN: [id number] Date of Birth: [birth year]  Transition of [facility]) CM/SW [place], [place] Phone Number: 11/07/2019, 4:50 PM  Clinical Narrative:     [facility] stated they will be able to take pt using her medicaid. CSW confirmed with MD that pt will not discharge with wound vac.   [facility] inquired about antibiotics. CSW informed [facility] that pit will be receiving abx through [facility] through 8/18. [facility] did not return call or text with answer on if they can accept pt with abx.   MD has ordered a new covid test in anticipation of tomorrow d/c.  Expected Discharge Plan: Skilled Nursing Facility Barriers to Discharge: Insurance Authorization [facility] Medicare denied SNF, [facility] trying to get approval with Medicaid)  Expected Discharge Plan and Services Expected Discharge Plan: [place]     Post Acute Care Choice: [place]                   DME Arranged:  (IV ABX THERAPY) DME Agency: Other - Comment Roel Cluck) Date DME Agency Contacted: 10/15/19 Time DME Agency Contacted: [phone] Representative spoke with at DME Agency: [place]: PT, RN [place] Agency: Well [place] Date [place]: 10/19/19 Time [place]: 67 Representative spoke with at [place]: [place] Interventions    Readmission Risk Interventions No flowsheet data found.   Emeterio Reeve, Latanya Presser, [place] Social Worker [phone]

## 2019-11-07 NOTE — Progress Notes (Signed)
  Subjective:  Patient ID: Alexis Ball, female    DOB: 1955/12/19,  MRN: [id number]   Feeling well, has some pain in foot. She had her VAC removed today. She is awaiting discharge.  Per social work, she will be able to be discharged to [facility] tomorrow.  They would not take her with a wound VAC, however they hopefully will accept with antibiotics.  Eating her dinner on my exam Negative for chest pain and shortness of breath Fever: no Night sweats: no Constitutional signs: no Review of all other systems is negative Objective:   Vitals:   11/07/19 1548 11/07/19 1959  BP: (!) 140/58 (!) 147/68  Pulse: 65 57  Resp: 14 17  Temp: 98.4 F (36.9 C) 98.3 F (36.8 C)  SpO2: 96% 99%   General AA&O x3. Normal mood and affect.  Vascular  Brisk capillary refill to all digits. Pedal hair present.  Neurologic Epicritic sensation grossly intact.  Dermatologic  wound is dressed with gauze dressings and Ace wrap, elevated on pillow  Orthopedic:  External fixator is intact, surgical position is maintained    Assessment & Plan:  Patient was evaluated and treated and all questions answered.   -I discussed her case with her primary surgeon Dr. March Rummage -Hopeful for DC soon -External fixator to remain intact.  Continue nonweightbearing to right lower extremity -Continue dressing changes with Aquacel per orders -If she discharged tomorrow or over the weekend, she should follow-up with myself at our [place] office at [address].  Call [phone] for an appointment.  To see Dr. March Rummage the following week.   Criselda Peaches, DPM  Accessible via secure chat for questions or concerns.

## 2019-11-07 NOTE — Plan of Care (Signed)
°  Problem: Education: Goal: Knowledge of General Education information will improve Description: Including pain rating scale, medication(s)/side effects and non-pharmacologic comfort measures Outcome: Progressing   Problem: Clinical Measurements: Goal: Ability to maintain clinical measurements within normal limits will improve Outcome: Progressing Goal: Will remain free from infection Outcome: Progressing   Problem: Clinical Measurements: Goal: Will remain free from infection Outcome: Progressing   

## 2019-11-07 NOTE — Progress Notes (Signed)
Wound vac dressing removed per MD order. Pt tolerated well, pain 2/10.

## 2019-11-07 NOTE — Progress Notes (Signed)
PROGRESS NOTE    Alexis Ball  XLK:[id number] DOB: 1955-06-18 DOA: 10/13/2019 PCP: Lucianne Lei, MD   Brief Narrative:  Patient is a 64 year old female with a history of hypertension, CVA who was admitted on 7/3 with worsening pain on the left foot after recent bunionectomy with a silicone implant.  In the emergency department she was found to have maggots coming out of the wound.  She was treated with antibiotics as an outpatient but she failed therapy.  She was admitted for IV antibiotics.  She underwent surgical debridement on 10/14/2019 and again on 10/17/2019 with placement of external fixator, removal of silicone implant.  Wound culture was positive for Enterobacter cloacae.  Patient was placed on ertapenem which has been planned to be continued till 11/28/2019 by PICC line per ID.  She is waiting for placement in skilled nursing facility.  Assessment & Plan:   Principal Problem:   Foot abscess, left Active Problems:   Hypertension   Abscess of bursa of left foot   Wound infection   Superficial venous thrombosis of arm, right   Left foot infection/abscess:.  She was treated with antibiotics as an outpatient but she failed therapy.  She was admitted for IV antibiotics.  She underwent surgical debridement on 10/14/2019 and again on 10/17/2019 with placement of external fixator, removal of silicone implant.  Wound culture was positive for Enterobacter cloacae.  Patient was placed on ertapenem which has been planned to be continued till 11/28/2019 by PICC line per ID.   Currently on on wound VAC which will be removed. Recommended to apply Xeroform gauze over foot wounds every day then cover with gauze and Ace wrap.  Hypertension: Monitor blood pressure.  Continue current regimen.  History of CVA: Continue aspirin         DVT prophylaxis:lovenox Code Status: Full Family Communication: None Status is: Inpatient    Dispo:  Patient From: Home  Planned Disposition: [place]  Expected discharge date:As soon as bed is available  Medically stable for discharge: Yes     Consultants: Orthopedics  Procedures:As above  Antimicrobials:  Anti-infectives (From admission, onward)   Start     Dose/Rate Route Frequency Ordered Stop   10/18/19 1400  ertapenem (INVANZ) 1,000 mg in sodium chloride 0.9 % 100 mL IVPB     Discontinue     1 g 200 mL/hr over 30 Minutes Intravenous Every 24 hours 10/18/19 0909     10/17/19 1739  vancomycin (VANCOCIN) powder  Status:  Discontinued          As needed 10/17/19 1740 10/17/19 1820   10/16/19 1400  ceFEPIme (MAXIPIME) 2 g in sodium chloride 0.9 % 100 mL IVPB  Status:  Discontinued        2 g 200 mL/hr over 30 Minutes Intravenous Every 8 hours 10/16/19 1233 10/18/19 0909   10/15/19 2100  vancomycin (VANCOCIN) IVPB 1000 mg/200 mL premix  Status:  Discontinued        1,000 mg 200 mL/hr over 60 Minutes Intravenous Every 12 hours 10/15/19 1155 10/16/19 1233   10/15/19 0800  vancomycin (VANCOREADY) IVPB 750 mg/150 mL  Status:  Discontinued        750 mg 150 mL/hr over 60 Minutes Intravenous Every 12 hours 10/14/19 1806 10/15/19 1155   10/14/19 1830  piperacillin-tazobactam (ZOSYN) IVPB 3.375 g  Status:  Discontinued        3.375 g 12.5 mL/hr over 240 Minutes Intravenous Every 8 hours 10/14/19 1806 10/16/19 1234  10/14/19 1800  vancomycin (VANCOREADY) IVPB 1500 mg/300 mL        1,500 mg 150 mL/hr over 120 Minutes Intravenous  Once 10/14/19 1745 10/14/19 2051   10/14/19 1440  vancomycin (VANCOCIN) powder  Status:  Discontinued          As needed 10/14/19 1531 10/14/19 1534   10/14/19 1400  ceFAZolin (ANCEF) IVPB 1 g/50 mL premix  Status:  Discontinued        1 g 100 mL/hr over 30 Minutes Intravenous Every 8 hours 10/14/19 1135 10/14/19 1741   10/14/19 1000  doxycycline (VIBRA-TABS) tablet 100 mg  Status:  Discontinued        100 mg Oral Every 12 hours 10/14/19 0840 10/14/19 1745   10/14/19 0630  piperacillin-tazobactam  (ZOSYN) IVPB 3.375 g        3.375 g 100 mL/hr over 30 Minutes Intravenous  Once 10/14/19 7782 10/14/19 0736      Subjective:  Patient seen and examined at bedside. Comfortable. No acute issues Objective: Vitals:   11/06/19 1440 11/06/19 2002 11/07/19 0335 11/07/19 0739  BP: (!) 136/51 (!) 130/76 120/65 (!) 137/72  Pulse: 63 64 64 63  Resp: 16 19 19 16   Temp: 98.4 F (36.9 C) 98.3 F (36.8 C) 98.2 F (36.8 C) 98.6 F (37 C)  TempSrc: Oral Oral Oral Oral  SpO2: 96% 99% 100% 100%  Weight:      Height:        Intake/Output Summary (Last 24 hours) at 11/07/2019 4235 Last data filed at 11/06/2019 1900 Gross per 24 hour  Intake 830.06 ml  Output --  Net 830.06 ml   Filed Weights   10/13/19 2329 10/17/19 1452  Weight: 82.1 kg 82.1 kg    Examination:  General exam: Appears calm and comfortable ,Not in distress,average built HEENT:PERRL,Oral mucosa moist, Ear/Nose normal on gross exam Respiratory system: Bilateral equal air entry, normal vesicular breath sounds, no wheezes or crackles  Cardiovascular system: S1 & S2 heard, RRR. No JVD, murmurs, rubs, gallops or clicks. No pedal edema. Gastrointestinal system: Abdomen is nondistended, soft and nontender. No organomegaly or masses felt. Normal bowel sounds heard. Central nervous system: Alert and oriented. No focal neurological deficits. Extremities: No edema, no clubbing ,no cyanosis, left foot wrapped with dressing,wound vac,external fixator Skin: No rashes, lesions or ulcers,no icterus ,no pallor   Data Reviewed: I have personally reviewed following labs and imaging studies  CBC: Recent Labs  Lab 11/03/19 0327 11/04/19 0349 11/05/19 0340 11/06/19 0404 11/07/19 0424  WBC 5.1 5.2 4.7 6.0 4.8  NEUTROABS 2.7 2.6 2.3 3.6 2.4  HGB 9.7* 10.0* 10.0* 9.8* 9.6*  HCT 30.6* 31.2* 31.7* 30.7* 30.1*  MCV 93.3 94.0 93.2 92.5 92.3  PLT 277 268 257 239 361   Basic Metabolic Panel: Recent Labs  Lab 11/03/19 0327  11/04/19 0349 11/05/19 0340 11/06/19 0404 11/07/19 0424  NA 137 138 137 139 138  K 4.0 3.8 4.0 3.9 3.9  CL 106 106 106 108 106  CO2 25 25 24 25 25   GLUCOSE 96 91 102* 104* 106*  BUN 15 11 12 12 10   CREATININE 0.78 0.72 0.70 0.92 0.65  CALCIUM 8.9 9.0 9.0 9.0 8.9  MG 1.8 1.7 1.7 1.6* 1.8   GFR: Estimated Creatinine Clearance: 74 mL/min (by C-G formula based on SCr of 0.65 mg/dL). Liver Function Tests: No results for input(s): AST, ALT, ALKPHOS, BILITOT, PROT, ALBUMIN in the last 168 hours. No results for input(s): LIPASE, AMYLASE in the  last 168 hours. No results for input(s): AMMONIA in the last 168 hours. Coagulation Profile: No results for input(s): INR, PROTIME in the last 168 hours. Cardiac Enzymes: No results for input(s): CKTOTAL, CKMB, CKMBINDEX, TROPONINI in the last 168 hours. BNP (last 3 results) No results for input(s): PROBNP in the last 8760 hours. HbA1C: No results for input(s): HGBA1C in the last 72 hours. CBG: No results for input(s): GLUCAP in the last 168 hours. Lipid Profile: No results for input(s): CHOL, HDL, LDLCALC, TRIG, CHOLHDL, LDLDIRECT in the last 72 hours. Thyroid Function Tests: No results for input(s): TSH, T4TOTAL, FREET4, T3FREE, THYROIDAB in the last 72 hours. Anemia Panel: No results for input(s): VITAMINB12, FOLATE, FERRITIN, TIBC, IRON, RETICCTPCT in the last 72 hours. Sepsis Labs: No results for input(s): PROCALCITON, LATICACIDVEN in the last 168 hours.  No results found for this or any previous visit (from the past 240 hour(s)).       Radiology Studies: No results found.      Scheduled Meds: . vitamin C  500 mg Oral Daily  . aspirin EC  81 mg Oral Daily  . brimonidine  1 drop Both Eyes BID  . Chlorhexidine Gluconate Cloth  6 each Topical Daily  . diclofenac Sodium  2 g Topical QID  . dorzolamide-timolol  1 drop Both Eyes BID  . enoxaparin (LOVENOX) injection  40 mg Subcutaneous Q24H  . ferrous sulfate  325 mg Oral Q  breakfast  . gabapentin  300 mg Oral q morning - 10a   And  . gabapentin  600 mg Oral QHS  . latanoprost  1 drop Both Eyes QHS  . lisinopril  2.5 mg Oral Daily  . methocarbamol  500 mg Oral q1600  . multivitamin with minerals  1 tablet Oral Daily  . nystatin cream   Topical BID  . omega-3 acid ethyl esters  1 g Oral Daily  . oxyCODONE  10 mg Oral Q12H  . senna-docusate  1 tablet Oral BID  . sodium chloride flush  10-40 mL Intracatheter Q12H   Continuous Infusions: . ertapenem Stopped (11/06/19 1456)     LOS: 24 days    Time spent: 25 mins,More than 50% of that time was spent in counseling and/or coordination of care.      Shelly Coss, MD [facility] P7/28/2021, 8:33 AM

## 2019-11-07 NOTE — Progress Notes (Addendum)
Physical Therapy Treatment Patient Details Name: Alexis Ball MRN: [id number] DOB: 08-Jul-1955 Today's Date: 11/07/2019    History of Present Illness Pt is a 64 yo female presenting s/p surgical drainage of L foot infection 7/4 with bone biopsy due to concern for possible osteomyelitis after bunionectomy with silicone implant on 2/44. Further examination reports osteomyelitis with infection of hardware.  Pt s/p I&D with hard ware removal/silicon implant removal and now ex fix and antibiotic spacer placed.  Surgeon also placed wound vac after procedure.   PMH includes: HTN, and stroke.   PT Comments    Pt declined OOB mobility secondary to increased L foot pain. Mod independent with bed mobility, including sidelying and repositioning for supine-level LE and core therex, as well as pressure relief; tolerated therex well. Demonstrates generalized weakness and decreased activity tolerance. HEP handout provided for pt to perform throughout the day.   Follow Up Recommendations  SNF     Equipment Recommendations  None recommended by PT    Recommendations for Other Services       Precautions / Restrictions Precautions Precautions: Fall;Other (comment) Precaution Comments: L foot wound vac Required Braces or Orthoses: Other Brace Other Brace: L foot CAM boot over ex fix Restrictions Weight Bearing Restrictions: Yes LLE Weight Bearing: Partial weight bearing LLE Partial Weight Bearing Percentage or Pounds: Through heel only in wa    Mobility  Bed Mobility               General bed mobility comments: Declined OOB mobility; mod indep to roll R/L for therex and repositioning with use of bed rail; able to bridge on RLE to scoot up  Transfers                    Ambulation/Gait                 Stairs             Wheelchair Mobility    Modified Rankin (Stroke Patients Only)       Balance                                             Cognition Arousal/Alertness: Awake/alert Behavior During Therapy: WFL for tasks assessed/performed Overall Cognitive Status: Within Functional Limits for tasks assessed                                        Exercises General Exercises - Lower Extremity Hip ABduction/ADduction: AROM;Both;Sidelying Straight Leg Raises: AROM;Both;Supine Hip Flexion/Marching: AROM;Both;Supine Other Exercises Other Exercises: RLE single leg bridges, sit-ups Other Exercises: HEP Medbridge handout (Access Code [email or web address]) - supine SLR, knee to chest, single leg bridge (on RLE), sidelying abduction    General Comments        Pertinent Vitals/Pain Pain Assessment: Faces Faces Pain Scale: Hurts even more Pain Location: L foot Pain Descriptors / Indicators: Pins and needles;Pressure Pain Intervention(s): Monitored during session;Repositioned    Home Living                      Prior Function            PT Goals (current goals can now be found in the care plan section) Progress towards PT goals: Progressing toward goals  Frequency    Min 3X/week      PT Plan Current plan remains appropriate    Co-evaluation              AM-PAC PT "6 Clicks" Mobility   Outcome Measure  Help needed turning from your back to your side while in a flat bed without using bedrails?: None Help needed moving from lying on your back to sitting on the side of a flat bed without using bedrails?: None Help needed moving to and from a bed to a chair (including a wheelchair)?: A Little Help needed standing up from a chair using your arms (e.g., wheelchair or bedside chair)?: A Little Help needed to walk in hospital room?: A Little Help needed climbing 3-5 steps with a railing? : A Lot 6 Click Score: 19    End of Session Equipment Utilized During Treatment: Gait belt Activity Tolerance: Patient limited by pain Patient left: in bed;with call bell/phone within reach;with bed alarm  set Nurse Communication: Mobility status PT Visit Diagnosis: Other abnormalities of gait and mobility (R26.89);Muscle weakness (generalized) (M62.81);Pain Pain - Right/Left: Left Pain - part of body: Ankle and joints of foot     Time: 3887-1959 PT Time Calculation (min) (ACUTE ONLY): 22 min  Charges:  $Therapeutic Exercise: 8-22 mins                    [name], PT, DPT Acute Rehabilitation Services  Pager [phone] Office [place] 11/07/2019, 3:30 PM

## 2019-11-08 ENCOUNTER — Telehealth: Payer: Self-pay

## 2019-11-08 ENCOUNTER — Inpatient Hospital Stay: Payer: Medicare HMO | Admitting: Internal Medicine

## 2019-11-08 MED ORDER — ERTAPENEM IV (FOR PTA / DISCHARGE USE ONLY)
1.0000 g | INTRAVENOUS | 0 refills | Status: AC
Start: 2019-11-08 — End: 2019-12-19

## 2019-11-08 MED ORDER — OXYCODONE-ACETAMINOPHEN 10-325 MG PO TABS: 1.0000 | ORAL_TABLET | Freq: Three times a day (TID) | ORAL | 0 refills | Status: AC | PRN

## 2019-11-08 NOTE — Consult Note (Signed)
WOC follow-up: Podiatry removed Vac yesterday and assessed wound and has provided topical treatment orders for the bedside nurses to perform; refer to their consult note.  No further role for [place] nurses.  Please re-consult if further assistance is needed.  Thank-you,  Julien Girt MSN, Deputy, [place], [place], [place]

## 2019-11-08 NOTE — Telephone Encounter (Signed)
Patient was scheduled for HSFU today but is still inpatient. Per chart she is to be discharged to [facility] facility today. HSFU needs to be reschedule with [facility] before completion of antibiotics 11/28/2019. Consuelo Pandy

## 2019-11-08 NOTE — Plan of Care (Signed)
  Problem: Pain Managment: Goal: General experience of comfort will improve Outcome: Progressing   Problem: Safety: Goal: Ability to remain free from injury will improve Outcome: Progressing   Problem: Skin Integrity: Goal: Risk for impaired skin integrity will decrease Outcome: Progressing   

## 2019-11-08 NOTE — TOC Progression Note (Addendum)
Transition of Care [facility]) - Progression Note    Patient Details  Name: Alexis Ball MRN: [id number] Date of Birth: April 04, 1956  [facility] CM/SW Contact  Sharin Mons, RN Phone Number: 11/08/2019, 1:01 PM  Clinical Narrative:    NCM learned from [facility] admission liaison they are ready to receive pt today, however, pt and pt's daughter are now refusing bed offer, requesting acceptance to another SNF. NCM reached out to [facility]( only other noted bed acceptance) for SNF placement. Their admission liaison stated they are sending it up to regional for approval because it's Medicaid.  TOC team will continue to monitor and follow ...   Expected Discharge Plan: Skilled Nursing Facility Barriers to Discharge: Insurance Authorization [facility] Medicare denied SNF, [place] trying to get approval with Medicaid). Pt now refuses to go to [facility].  Expected Discharge Plan and Services Expected Discharge Plan: [place] Choice: [place]   Expected Discharge Date: 11/08/19                  Social Determinants of Health (SDOH) Interventions    Readmission Risk Interventions No flowsheet data found.

## 2019-11-08 NOTE — Plan of Care (Signed)

## 2019-11-08 NOTE — Discharge Summary (Addendum)
Physician Discharge Summary  Alexis Ball:[id number] DOB: 1955/12/26 DOA: 10/13/2019  PCP: Lucianne Lei, MD  Admit date: 10/13/2019 Discharge date: 11/09/19 Admitted From: Home Disposition:  SNF  Discharge Condition:Stable CODE STATUS:FULL Diet recommendation: Heart Healthy  Brief/Interim Summary:  Patient is a 64 year old female with a history of hypertension, CVA who was admitted on 7/3 with worsening pain on the left foot after recent bunionectomy with a silicone implant.  In the emergency department she was found to have maggots coming out of the wound.  She was treated with antibiotics as an outpatient but she failed therapy.  She was admitted for IV antibiotics.  She underwent surgical debridement on 10/14/2019 and again on 10/17/2019 with placement of external fixator, removal of silicone implant.  Wound culture was positive for Enterobacter cloacae.  Patient was placed on ertapenem which has been planned to be continued till 11/28/2019 by PICC line per ID.  Patient is medically stable for discharge to skilled nursing facility.  Following problems were addressed during her hospitalization:  Left foot infection/abscess:.  She was treated with antibiotics as an outpatient but she failed therapy.  She was admitted for IV antibiotics.  She underwent surgical debridement on 10/14/2019 and again on 10/17/2019 with placement of external fixator, removal of silicone implant.  Wound culture was positive for Enterobacter cloacae.  Patient was placed on ertapenem which has been planned to be continued till 11/28/2019 by PICC line per ID.  Wound vac removed Dressing instruction:Apply Aquacel over wounds, followed by 4x4, kerlix, ACE bandage. Moisten Aquacel with NS each time to assist with removal. She has been recommended to follow-up with Dr. March Rummage as an outpatient in a week.  She is also recommended to follow-up with ID as an outpatient.  Hypertension: Monitor blood pressure.  Continue current  regimen.  History of CVA: Continue aspirin      Discharge Diagnoses:  Principal Problem:   Foot abscess, left Active Problems:   Hypertension   Abscess of bursa of left foot   Wound infection   Superficial venous thrombosis of arm, right    Discharge Instructions  Discharge Instructions    [facility] pharmacist to adjust dose for Vancomycin, Aminoglycosides and other anti-infective therapies as requested by physician.   Complete by: As directed    [facility] to provide Cath Flo '2mg'$    Complete by: As directed    Administer for PICC line occlusion and as ordered by physician for other access device issues.   Anaphylaxis Kit: Provided to treat any anaphylactic reaction to the medication being provided to the patient if First Dose or when requested by physician   Complete by: As directed    Epinephrine '1mg'$ /ml vial / amp: Administer 0.'3mg'$  (0.24m) subcutaneously once for moderate to severe anaphylaxis, nurse to call physician and pharmacy when reaction occurs and call 911 if needed for immediate care   Diphenhydramine '50mg'$ /ml IV vial: Administer 25-'50mg'$  IV/IM PRN for first dose reaction, rash, itching, mild reaction, nurse to call physician and pharmacy when reaction occurs   Sodium Chloride 0.9% NS 5088mIV: Administer if needed for hypovolemic blood pressure drop or as ordered by physician after call to physician with anaphylactic reaction   Change dressing on IV access line weekly and PRN   Complete by: As directed    Diet - low sodium heart healthy   Complete by: As directed    Discharge instructions   Complete by: As directed    1)Please take prescribed medications as instructed.  2)Follow up with Dr. March Rummage, podiatry, as an outpatient 3)Follow up with infectious disease as an outpatient.  Name and number of the provider has been attached.   Discharge wound care:   Complete by: As directed    Apply Aquacel over wounds, followed by 4x4, kerlix, ACE  bandage. Moisten Aquacel with NS each time to assist with removal.   Flush IV access with Sodium Chloride 0.9% and Heparin 10 units/ml or 100 units/ml   Complete by: As directed    Home infusion instructions - [facility]   Complete by: As directed    Instructions: Flush IV access with Sodium Chloride 0.9% and Heparin 10units/ml or 100units/ml   Change dressing on IV access line: Weekly and PRN   Instructions Cath Flo '2mg'$ : Administer for PICC Line occlusion and as ordered by physician for other access device   [facility] pharmacist to adjust dose for: Vancomycin, Aminoglycosides and other anti-infective therapies as requested by physician   Increase activity slowly   Complete by: As directed    Method of administration may be changed at the discretion of home infusion pharmacist based upon assessment of the patient and/or caregiver's ability to self-administer the medication ordered   Complete by: As directed      Allergies as of 11/09/2019   No Known Allergies     Medication List    STOP taking these medications   cephALEXin 500 MG capsule Commonly known as: KEFLEX   clindamycin 150 MG capsule Commonly known as: CLEOCIN   hydrochlorothiazide 25 MG tablet Commonly known as: HYDRODIURIL     TAKE these medications   aspirin EC 81 MG tablet Take 81 mg by mouth daily.   bimatoprost 0.01 % Soln Commonly known as: LUMIGAN Place 1 drop into both eyes at bedtime.   brimonidine 0.15 % ophthalmic solution Commonly known as: ALPHAGAN Place 1 drop into both eyes 2 (two) times daily.   dorzolamide-timolol 22.3-6.8 MG/ML ophthalmic solution Commonly known as: COSOPT Place 1 drop into both eyes 2 (two) times daily.   ertapenem  IVPB Commonly known as: INVANZ Inject 1 g into the vein daily. Indication:  Osteomyelitis of left foot First Dose: Yes Last Day of Therapy:  11/28/19 Labs - Once weekly:  CBC/D and BMP, Labs - Every other week:  ESR and CRP Method  of administration: Mini-Bag Plus / Gravity Method of administration may be changed at the discretion of home infusion pharmacist based upon assessment of the patient and/or caregiver's ability to self-administer the medication ordered.   ferrous sulfate 325 (65 FE) MG tablet Take 325 mg by mouth daily with breakfast.   Fish Oil 1000 MG Caps Take 1,000 mg by mouth daily.   gabapentin 300 MG capsule Commonly known as: NEURONTIN Take 300-600 mg by mouth See admin instructions. Take one capsule ('300mg'$ ) in the morning and 2 capsules ('600mg'$ ) at bedtime.   ibuprofen 200 MG tablet Commonly known as: ADVIL Take 600-800 mg by mouth every 6 (six) hours as needed for headache (pain).   lidocaine 5 % ointment Commonly known as: XYLOCAINE Apply 1 application topically 2 (two) times daily as needed for mild pain.   meloxicam 7.5 MG tablet Commonly known as: Mobic Take 1 tablet (7.5 mg total) by mouth daily.   multivitamin capsule Take 1 capsule by mouth daily.   ondansetron 4 MG tablet Commonly known as: Zofran Take 1 tablet (4 mg total) by mouth every 8 (eight) hours as needed. What changed: reasons to take  this   oxyCODONE-acetaminophen 10-325 MG tablet Commonly known as: Percocet Take 1 tablet by mouth every 8 (eight) hours as needed for up to 7 days for pain.   polyethylene glycol 17 g packet Commonly known as: MiraLax Take 17 g by mouth daily as needed for moderate constipation or severe constipation.   potassium chloride SA 20 MEQ tablet Commonly known as: KLOR-CON Take 20 mEq by mouth daily.   REFRESH OP Place 1 drop into both eyes 3 (three) times daily as needed (dry eyes).   triamcinolone cream 0.1 % Commonly known as: KENALOG Apply 1 application topically 2 (two) times daily.            Discharge Care Instructions  (From admission, onward)         Start     Ordered   11/08/19 0000  Change dressing on IV access line weekly and PRN  (Home infusion instructions  - [facility] )        11/08/19 0859   11/08/19 0000  Discharge wound care:       Comments: Apply Aquacel over wounds, followed by 4x4, kerlix, ACE bandage. Moisten Aquacel with NS each time to assist with removal.   11/08/19 1139          Contact information for follow-up providers    Comer, Okey Regal, MD Follow up.   Specialty: Infectious Diseases Why: 11/08/19 at 9:45. Please call to reschedule if you are not able to make this appointment.  Contact information: [address] [place] [place] [address] [phone]        [facility] Follow up.   Specialty: Home Health Services Why: home health services arranged Contact information: [place] [address] [phone]        Evelina Bucy, DPM. Schedule an appointment as soon as possible for a visit in 1 week(s).   Specialty: Podiatry Contact information: [address] [place] [address] [phone]            Contact information for after-discharge care    Destination    [facility] .   Service: Skilled Nursing Contact information: [place] [phone]                 No Known Allergies  Consultations:  ID,podiatry   Procedures/Studies: MR FOOT LEFT WO CONTRAST  Result Date: 10/14/2019 CLINICAL DATA:  Patient status post bunionectomy and placement of a silicone implant at the first MTP joint 10/05/2019. Pain and drainage at the surgical wound. EXAM: MRI OF THE LEFT FOOT WITHOUT CONTRAST TECHNIQUE: Multiplanar, multisequence MR imaging of the left foot was performed. No intravenous contrast was administered. COMPARISON:  Plain films left foot earlier today. FINDINGS: Bones/Joint/Cartilage Implant is in place in the first MTP joint and results in some artifact on the exam. There is marrow edema throughout the first metatarsal and great toe including the distal phalanx where no hardware  is present. Edema is also seen in both the medial and lateral sesamoids. No joint effusion. Ligaments Visualization is limited about the first MTP joint but no obvious tear is seen. Muscles and Tendons No intramuscular fluid collection or mass. There is edema and plantar musculature deep to the first metatarsal. No intramuscular fluid collection. Soft tissues A skin wound on the dorsum of the foot at the level of the distal third metatarsal. And underlying heterogeneous fluid collection measures 1.6 cm transverse by  0.9 cm craniocaudal by approximately 3 cm long. Extensive subcutaneous edema is present diffusely. On the coronal T1 weighted images, there appears to be soft tissue gas over the dorsum of the foot near the level of the tarsometatarsal joints. IMPRESSION: Skin wound on the dorsum of the foot at the level of the distal third metatarsal with an underlying fluid collection likely representing an abscess. Status post implant placement at the first MTP joint. Marrow edema throughout the first metatarsal and proximal phalanx of the great toe could be postoperative but may be due to osteomyelitis given the soft tissue wound and abscess. There is also marrow edema in both the medial and lateral sesamoids in the distal phalanx of the great toe which may reflect postoperative change but is worrisome for osteomyelitis. Diffuse subcutaneous edema about the foot compatible with cellulitis. On coronal images, there appears to be soft tissue gas in the dorsum of the foot proximally. This is not seen on the prior plain films. Edema in the flexor hallucis muscle subjacent to the first metatarsal could be postoperative but may represent myositis. No intramuscular abscess. Electronically Signed   By: Inge Rise M.D.   On: 10/14/2019 12:20   DG Foot 2 Views Left  Result Date: 10/14/2019 CLINICAL DATA:  64 year old female with history of left foot pain. EXAM: LEFT FOOT - 2 VIEW COMPARISON:  Left foot radiograph  10/11/2019. FINDINGS: Postoperative changes of arthroplasty at the first MTP joint are again noted. No periprosthetic fracture. Extensive soft tissue swelling is noted overlying the forefoot, particularly dorsally. There is no evidence of fracture or dislocation. There is no evidence of arthropathy or other focal bone abnormality. Soft tissues are unremarkable. IMPRESSION: 1. Extensive soft tissue swelling overlying the forefoot in this patient status post first MTP joint arthroplasty. Electronically Signed   By: Vinnie Langton M.D.   On: 10/14/2019 05:22   DG Foot Complete Left  Result Date: 10/17/2019 CLINICAL DATA:  Foot surgery removal of implant EXAM: DG C-ARM 1-60 MIN; LEFT FOOT - COMPLETE 3+ VIEW CONTRAST:  None FLUOROSCOPY TIME:  Fluoroscopy Time:  52 seconds Number of Acquired Spot Images: 2 COMPARISON:  10/14/2019 FINDINGS: Two low resolution intraoperative spot views of the left foot. Removal of first MTP implant with presence of rounded radiopaque material in the region. Placement of external fixation devices over the first digit and tarsal bones. IMPRESSION: Intraoperative fluoroscopic assistance provided during left foot surgery Electronically Signed   By: Donavan Foil M.D.   On: 10/17/2019 19:45   DG Foot Complete Left  Result Date: 10/11/2019 Please see detailed radiograph report in office note.  DG C-Arm 1-60 Min  Result Date: 10/17/2019 CLINICAL DATA:  Foot surgery removal of implant EXAM: DG C-ARM 1-60 MIN; LEFT FOOT - COMPLETE 3+ VIEW CONTRAST:  None FLUOROSCOPY TIME:  Fluoroscopy Time:  52 seconds Number of Acquired Spot Images: 2 COMPARISON:  10/14/2019 FINDINGS: Two low resolution intraoperative spot views of the left foot. Removal of first MTP implant with presence of rounded radiopaque material in the region. Placement of external fixation devices over the first digit and tarsal bones. IMPRESSION: Intraoperative fluoroscopic assistance provided during left foot surgery  Electronically Signed   By: Donavan Foil M.D.   On: 10/17/2019 19:45   VAS [place] UPPER EXTREMITY VENOUS DUPLEX  Result Date: 10/31/2019 UPPER VENOUS STUDY  Indications: Swelling, and Pain Comparison Study: no priro Performing Technologist: [name] RVS  Examination Guidelines: A complete evaluation includes B-mode imaging, spectral Doppler, color Doppler, and  power Doppler as needed of all accessible portions of each vessel. Bilateral testing is considered an integral part of a complete examination. Limited examinations for reoccurring indications may be performed as noted.  Right Findings: +----------+------------+---------+-----------+----------+-----------------+ RIGHT     CompressiblePhasicitySpontaneousProperties     Summary      +----------+------------+---------+-----------+----------+-----------------+ IJV           Full       Yes       Yes                                +----------+------------+---------+-----------+----------+-----------------+ Subclavian    Full       Yes       Yes                                +----------+------------+---------+-----------+----------+-----------------+ Axillary      Full       Yes       Yes                                +----------+------------+---------+-----------+----------+-----------------+ Brachial      Full       Yes       Yes                                +----------+------------+---------+-----------+----------+-----------------+ Radial        Full                                                    +----------+------------+---------+-----------+----------+-----------------+ Ulnar         Full                                                    +----------+------------+---------+-----------+----------+-----------------+ Cephalic      None                                  Age Indeterminate +----------+------------+---------+-----------+----------+-----------------+ Basilic       Full                                                     +----------+------------+---------+-----------+----------+-----------------+  Summary:  Right: No evidence of deep vein thrombosis in the upper extremity. Findings consistent with age indeterminate superficial vein thrombosis involving the right cephalic vein.  *See table(s) above for measurements and observations.  Diagnosing physician: Ruta Hinds MD Electronically signed by Ruta Hinds MD on 10/31/2019 at 6:11:42 PM.    Final    [place] EKG SITE RITE  Result Date: 10/17/2019 If Site Rite image not attached, placement could not be confirmed due to current cardiac rhythm.  [place] EKG SITE RITE  Result Date: 10/16/2019 If Site Rite image not attached, placement could not be confirmed due to  current cardiac rhythm.     Subjective: Patient seen and examined at the bedside this morning.  Hemodynamically stable for discharge.  Discharge Exam: Vitals:   11/09/19 0929 11/09/19 1030  BP: (!) 138/81 (!) 138/81  Pulse: 65   Resp: 15   Temp: 98 F (36.7 C)   SpO2: 98%    Vitals:   11/08/19 2006 11/09/19 0503 11/09/19 0929 11/09/19 1030  BP: (!) 146/68 (!) 137/74 (!) 138/81 (!) 138/81  Pulse: 68 59 65   Resp: '16 17 15   '$ Temp: 98.3 F (36.8 C) 97.6 F (36.4 C) 98 F (36.7 C)   TempSrc: Oral Oral Oral   SpO2: 100% 100% 98%   Weight:      Height:        General: Pt is alert, awake, not in acute distress Cardiovascular: RRR, S1/S2 +, no rubs, no gallops Respiratory: CTA bilaterally, no wheezing, no rhonchi Abdominal: Soft, NT, ND, bowel sounds + Extremities: no edema, no cyanosis, external fixator and dressing on the left foot.    The results of significant diagnostics from this hospitalization (including imaging, microbiology, ancillary and laboratory) are listed below for reference.     Microbiology: Recent Results (from the past 240 hour(s))  SARS Coronavirus 2 by RT PCR (hospital order, performed in [facility] hospital lab) Nasopharyngeal  Nasopharyngeal Swab     Status: None   Collection Time: 11/07/19  2:50 PM   Specimen: Nasopharyngeal Swab  Result Value Ref Range Status   SARS Coronavirus 2 NEGATIVE NEGATIVE Final    Comment: (NOTE) SARS-CoV-2 target nucleic acids are NOT DETECTED.  The SARS-CoV-2 RNA is generally detectable in upper and lower respiratory specimens during the acute phase of infection. The lowest concentration of SARS-CoV-2 viral copies this assay can detect is 250 copies / mL. A negative result does not preclude SARS-CoV-2 infection and should not be used as the sole basis for treatment or other patient management decisions.  A negative result may occur with improper specimen collection / handling, submission of specimen other than nasopharyngeal swab, presence of viral mutation(s) within the areas targeted by this assay, and inadequate number of viral copies (<250 copies / mL). A negative result must be combined with clinical observations, patient history, and epidemiological information.  Fact Sheet for Patients:   StrictlyIdeas.no  Fact Sheet for Healthcare Providers: BankingDealers.co.za  This test is not yet approved or  cleared by the [place] FDA and has been authorized for detection and/or diagnosis of SARS-CoV-2 by FDA under an Emergency Use Authorization (EUA).  This EUA will remain in effect (meaning this test can be used) for the duration of the COVID-19 declaration under Section 564(b)(1) of the Act, 21 U.S.C. section 360bbb-3(b)(1), unless the authorization is terminated or revoked sooner.  Performed at [facility], [place] [address], [place], [place] [address]      Labs: BNP (last 3 results) No results for input(s): BNP in the last 8760 hours. Basic Metabolic Panel: Recent Labs  Lab 11/03/19 0327 11/04/19 0349 11/05/19 0340 11/06/19 0404 11/07/19 0424  NA 137 138 137 139 138  K 4.0 3.8 4.0 3.9 3.9  CL 106 106  106 108 106  CO2 '25 25 24 25 25  '$ GLUCOSE 96 91 102* 104* 106*  BUN '15 11 12 12 10  '$ CREATININE 0.78 0.72 0.70 0.92 0.65  CALCIUM 8.9 9.0 9.0 9.0 8.9  MG 1.8 1.7 1.7 1.6* 1.8   Liver Function Tests: No results for input(s): AST,  ALT, ALKPHOS, BILITOT, PROT, ALBUMIN in the last 168 hours. No results for input(s): LIPASE, AMYLASE in the last 168 hours. No results for input(s): AMMONIA in the last 168 hours. CBC: Recent Labs  Lab 11/03/19 0327 11/04/19 0349 11/05/19 0340 11/06/19 0404 11/07/19 0424  WBC 5.1 5.2 4.7 6.0 4.8  NEUTROABS 2.7 2.6 2.3 3.6 2.4  HGB 9.7* 10.0* 10.0* 9.8* 9.6*  HCT 30.6* 31.2* 31.7* 30.7* 30.1*  MCV 93.3 94.0 93.2 92.5 92.3  PLT 277 268 257 239 245   Cardiac Enzymes: No results for input(s): CKTOTAL, CKMB, CKMBINDEX, TROPONINI in the last 168 hours. BNP: Invalid input(s): POCBNP CBG: No results for input(s): GLUCAP in the last 168 hours. D-Dimer No results for input(s): DDIMER in the last 72 hours. Hgb A1c No results for input(s): HGBA1C in the last 72 hours. Lipid Profile No results for input(s): CHOL, HDL, LDLCALC, TRIG, CHOLHDL, LDLDIRECT in the last 72 hours. Thyroid function studies No results for input(s): TSH, T4TOTAL, T3FREE, THYROIDAB in the last 72 hours.  Invalid input(s): FREET3 Anemia work up No results for input(s): VITAMINB12, FOLATE, FERRITIN, TIBC, IRON, RETICCTPCT in the last 72 hours. Urinalysis    Component Value Date/Time   COLORURINE AMBER (A) 03/07/2014 2251   APPEARANCEUR CLOUDY (A) 03/07/2014 2251   LABSPEC 1.025 03/02/2019 1243   PHURINE 7.0 03/02/2019 1243   GLUCOSEU NEGATIVE 03/02/2019 1243   HGBUR NEGATIVE 03/02/2019 1243   BILIRUBINUR NEGATIVE 03/02/2019 1243   KETONESUR NEGATIVE 03/02/2019 1243   PROTEINUR NEGATIVE 03/02/2019 1243   UROBILINOGEN 2.0 (H) 03/02/2019 1243   NITRITE NEGATIVE 03/02/2019 1243   LEUKOCYTESUR NEGATIVE 03/02/2019 1243   Sepsis Labs Invalid input(s): PROCALCITONIN,  WBC,   LACTICIDVEN Microbiology Recent Results (from the past 240 hour(s))  SARS Coronavirus 2 by RT PCR (hospital order, performed in [place] hospital lab) Nasopharyngeal Nasopharyngeal Swab     Status: None   Collection Time: 11/07/19  2:50 PM   Specimen: Nasopharyngeal Swab  Result Value Ref Range Status   SARS Coronavirus 2 NEGATIVE NEGATIVE Final    Comment: (NOTE) SARS-CoV-2 target nucleic acids are NOT DETECTED.  The SARS-CoV-2 RNA is generally detectable in upper and lower respiratory specimens during the acute phase of infection. The lowest concentration of SARS-CoV-2 viral copies this assay can detect is 250 copies / mL. A negative result does not preclude SARS-CoV-2 infection and should not be used as the sole basis for treatment or other patient management decisions.  A negative result may occur with improper specimen collection / handling, submission of specimen other than nasopharyngeal swab, presence of viral mutation(s) within the areas targeted by this assay, and inadequate number of viral copies (<250 copies / mL). A negative result must be combined with clinical observations, patient history, and epidemiological information.  Fact Sheet for Patients:   StrictlyIdeas.no  Fact Sheet for Healthcare Providers: BankingDealers.co.za  This test is not yet approved or  cleared by the [place] FDA and has been authorized for detection and/or diagnosis of SARS-CoV-2 by FDA under an Emergency Use Authorization (EUA).  This EUA will remain in effect (meaning this test can be used) for the duration of the COVID-19 declaration under Section 564(b)(1) of the Act, 21 U.S.C. section 360bbb-3(b)(1), unless the authorization is terminated or revoked sooner.  Performed at [facility], [place] [address] [place], [place] [address]     Please note: You were cared for by a hospitalist during your hospital stay. Once you are  discharged, your primary  care physician will handle any further medical issues. Please note that NO REFILLS for any discharge medications will be authorized once you are discharged, as it is imperative that you return to your primary care physician (or establish a relationship with a primary care physician if you do not have one) for your post hospital discharge needs so that they can reassess your need for medications and monitor your lab values.    Time coordinating discharge: 40 minutes  SIGNED:   Shelly Coss, MD  [facility] 11/09/2019, 11:33 AM Pager [phone]  If 7PM-7AM, please contact night-coverage [email or web address] Password [email or web address]

## 2019-11-09 DIAGNOSIS — G459 Transient cerebral ischemic attack, unspecified: Secondary | ICD-10-CM | POA: Diagnosis not present

## 2019-11-09 DIAGNOSIS — L02612 Cutaneous abscess of left foot: Secondary | ICD-10-CM | POA: Diagnosis not present

## 2019-11-09 DIAGNOSIS — Z7401 Bed confinement status: Secondary | ICD-10-CM | POA: Diagnosis not present

## 2019-11-09 DIAGNOSIS — M2550 Pain in unspecified joint: Secondary | ICD-10-CM | POA: Diagnosis not present

## 2019-11-09 DIAGNOSIS — R531 Weakness: Secondary | ICD-10-CM | POA: Diagnosis not present

## 2019-11-09 DIAGNOSIS — R0902 Hypoxemia: Secondary | ICD-10-CM | POA: Diagnosis not present

## 2019-11-09 MED ORDER — HEPARIN SOD (PORK) LOCK FLUSH 100 UNIT/ML IV SOLN
250.0000 [IU] | INTRAVENOUS | Status: AC | PRN
  Administered 2019-11-09: 250 [IU]
  Filled 2019-11-09: qty 2.5

## 2019-11-09 NOTE — Progress Notes (Signed)
Patient seen and examined at the bedside this morning.  Hemodynamically stable.  Comfortable.  Medically stable for discharge to skilled nursing facility today.  Discharge orders and summary have already been placed.  No new changes for today in the management.

## 2019-11-09 NOTE — Plan of Care (Signed)

## 2019-11-09 NOTE — Care Management Important Message (Signed)
Important Message  Patient Details  Name: Alexis Ball MRN: [id number] Date of Birth: [birth year]   Medicare Important Message Given:  Yes     Orbie Pyo 11/09/2019, 2:39 PM

## 2019-11-09 NOTE — Progress Notes (Signed)
Patient discharged at this time. Night and  pain medicine administered. VS is stable. No complaints.  Patient transferred to stretcher and transported via PTar to a skilled nursing facility.  Report given to PTar.

## 2019-11-09 NOTE — Plan of Care (Signed)

## 2019-11-09 NOTE — Progress Notes (Signed)
Report given to [facility] (nurse) at [facility].  A discharge packet will be sent via PTAR.  IV team has been consulted for PICC line care prior to discharge.  PTAR is scheduled to pick up at 4 pm.  The patient is aware of discharge and transportation arrangements

## 2019-11-09 NOTE — TOC Transition Note (Addendum)
Transition of Care [facility]) - CM/SW Discharge Note   Patient Details  Name: Alexis Ball MRN: [id number] Date of Birth: 12/24/1955  Transition of Care [facility]) CM/SW Contact:  Sharin Mons, RN Phone [place] 11/09/2019, 1:09 PM   Clinical Narrative:    SNF bed offer extended from [facility] per admission liaison Mariann Laster and pt accepted. MD made aware. Pt will transition to SNF today.  Patient will DC to: [place] Anticipated DC date: 11/09/2019 Family notified: yes, [name] ( daughter) Transport by: Corey Harold   Per MD patient ready for DC to. RN, patient, patient's family, and facility notified of DC. Discharge Summary and FL2 sent to facility. RN to call report prior to discharge ([phone]) Rm # [phone]. DC packet on chart. Ambulance transport requested for patient.   RNCM will sign off for now as intervention is no longer needed. Please consult [place] again if new needs arise.      Barriers to Discharge: No Barriers Identified   Patient Goals and CMS Choice Patient states their goals for this hospitalization and ongoing recovery are:: to get better and go home CMS Medicare.gov Compare Post Acute Care list provided to:: Patient Choice offered to / list presented to : Patient  Discharge Placement                       Discharge Plan and Services     Post Acute Care Choice: [place]          DME Arranged:  (IV ABX THERAPY) DME Agency: Other - Comment Roel Cluck) Date DME Agency Contacted: 10/15/19 Time DME Agency Contacted: [phone] Representative spoke with at DME Agency: [place]: PT, RN [place] Agency: [facility] [place] Date [place]: 10/19/19 Time [place]: 47 Representative spoke with at [place]: [place] ([place]) Interventions     Readmission Risk Interventions No flowsheet data found.

## 2019-11-09 NOTE — Progress Notes (Signed)
The patient is awaiting for transportation to Skilled nursing facility

## 2019-11-09 NOTE — Plan of Care (Signed)
Patient discharged.

## 2019-11-12 DIAGNOSIS — I639 Cerebral infarction, unspecified: Secondary | ICD-10-CM | POA: Diagnosis not present

## 2019-11-12 DIAGNOSIS — H409 Unspecified glaucoma: Secondary | ICD-10-CM | POA: Diagnosis not present

## 2019-11-12 DIAGNOSIS — I10 Essential (primary) hypertension: Secondary | ICD-10-CM | POA: Diagnosis not present

## 2019-11-14 ENCOUNTER — Telehealth: Payer: Self-pay | Admitting: *Deleted

## 2019-11-14 DIAGNOSIS — M6281 Muscle weakness (generalized): Secondary | ICD-10-CM | POA: Diagnosis not present

## 2019-11-14 DIAGNOSIS — Z4889 Encounter for other specified surgical aftercare: Secondary | ICD-10-CM | POA: Diagnosis not present

## 2019-11-14 DIAGNOSIS — S91302A Unspecified open wound, left foot, initial encounter: Secondary | ICD-10-CM | POA: Diagnosis not present

## 2019-11-14 NOTE — Telephone Encounter (Signed)
Daughter called on Monday august 2nd, 2021( [phone]) and stated that the patient was released from the hospital and was moved to [facility] and was needing wound care orders and patient's appointment. Alexis Ball

## 2019-11-14 NOTE — Telephone Encounter (Signed)
[place] at [phone] and left a message for Judeen Hammans to call me back and left me know about the patient's care orders at [phone]. Lattie Haw

## 2019-11-15 ENCOUNTER — Encounter: Payer: Medicare HMO | Admitting: Podiatry

## 2019-11-15 DIAGNOSIS — I639 Cerebral infarction, unspecified: Secondary | ICD-10-CM | POA: Diagnosis not present

## 2019-11-15 DIAGNOSIS — I10 Essential (primary) hypertension: Secondary | ICD-10-CM | POA: Diagnosis not present

## 2019-11-15 DIAGNOSIS — T8149XA Infection following a procedure, other surgical site, initial encounter: Secondary | ICD-10-CM | POA: Diagnosis not present

## 2019-11-15 DIAGNOSIS — R5381 Other malaise: Secondary | ICD-10-CM | POA: Diagnosis not present

## 2019-11-19 DIAGNOSIS — I10 Essential (primary) hypertension: Secondary | ICD-10-CM | POA: Diagnosis not present

## 2019-11-19 DIAGNOSIS — I639 Cerebral infarction, unspecified: Secondary | ICD-10-CM | POA: Diagnosis not present

## 2019-11-19 DIAGNOSIS — T8149XA Infection following a procedure, other surgical site, initial encounter: Secondary | ICD-10-CM | POA: Diagnosis not present

## 2019-11-19 DIAGNOSIS — H409 Unspecified glaucoma: Secondary | ICD-10-CM | POA: Diagnosis not present

## 2019-11-20 ENCOUNTER — Ambulatory Visit (INDEPENDENT_AMBULATORY_CARE_PROVIDER_SITE_OTHER): Payer: Medicare HMO | Admitting: Podiatry

## 2019-11-20 ENCOUNTER — Other Ambulatory Visit: Payer: Self-pay

## 2019-11-20 DIAGNOSIS — T8131XD Disruption of external operation (surgical) wound, not elsewhere classified, subsequent encounter: Secondary | ICD-10-CM

## 2019-11-20 DIAGNOSIS — M86172 Other acute osteomyelitis, left ankle and foot: Secondary | ICD-10-CM

## 2019-11-20 MED ORDER — OXYCODONE HCL 10 MG PO TABS: 10.0000 mg | ORAL_TABLET | Freq: Three times a day (TID) | ORAL | 0 refills | Status: DC | PRN

## 2019-11-20 NOTE — Patient Instructions (Signed)
Pre-Operative Instructions  Congratulations, you have decided to take an important step towards improving your quality of life.  You can be assured that the doctors and staff at [facility] will be with you every step of the way.  Here are some important things you should know:  1. Plan to be at the surgery center/hospital at least 1 (one) hour prior to your scheduled time, unless otherwise directed by the surgical center/hospital staff.  You must have a responsible adult accompany you, remain during the surgery and drive you home.  Make sure you have directions to the surgical center/hospital to ensure you arrive on time. 2. If you are having surgery at [facility] or [facility], you will need a copy of your medical history and physical form from your family physician within one month prior to the date of surgery. We will give you a form for your primary physician to complete.  3. We make every effort to accommodate the date you request for surgery.  However, there are times where surgery dates or times have to be moved.  We will contact you as soon as possible if a change in schedule is required.   4. No aspirin/ibuprofen for one week before surgery.  If you are on aspirin, any non-steroidal anti-inflammatory medications (Mobic, Aleve, Ibuprofen) should not be taken seven (7) days prior to your surgery.  You make take Tylenol for pain prior to surgery.  5. Medications - If you are taking daily heart and blood pressure medications, seizure, reflux, allergy, asthma, anxiety, pain or diabetes medications, make sure you notify the surgery center/hospital before the day of surgery so they can tell you which medications you should take or avoid the day of surgery. 6. No food or drink after midnight the night before surgery unless directed otherwise by surgical center/hospital staff. 7. No alcoholic beverages 24-hours prior to surgery.  No smoking 24-hours prior or 24-hours after  surgery. 8. Wear loose pants or shorts. They should be loose enough to fit over bandages, boots, and casts. 9. Don't wear slip-on shoes. Sneakers are preferred. 10. Bring your boot with you to the surgery center/hospital.  Also bring crutches or a walker if your physician has prescribed it for you.  If you do not have this equipment, it will be provided for you after surgery. 11. If you have not been contacted by the surgery center/hospital by the day before your surgery, call to confirm the date and time of your surgery. 12. Leave-time from work may vary depending on the type of surgery you have.  Appropriate arrangements should be made prior to surgery with your employer. 13. Prescriptions will be provided immediately following surgery by your doctor.  Fill these as soon as possible after surgery and take the medication as directed. Pain medications will not be refilled on weekends and must be approved by the doctor. 14. Remove nail polish on the operative foot and avoid getting pedicures prior to surgery. 15. Wash the night before surgery.  The night before surgery wash the foot and leg well with water and the antibacterial soap provided. Be sure to pay special attention to beneath the toenails and in between the toes.  Wash for at least three (3) minutes. Rinse thoroughly with water and dry well with a towel.  Perform this wash unless told not to do so by your physician.  Enclosed: 1 Ice pack (please put in freezer the night before surgery)   1 Hibiclens skin cleaner     Pre-op instructions  If you have any questions regarding the instructions, please do not hesitate to call our office.  [place]: [address], [place], [place] [address] -- [phone]  [place]: [address] [place], [place] [address] -- [phone]  [place]: [address], [place], [place] [address] -- [phone]   Website: [email or web address] 

## 2019-11-20 NOTE — Progress Notes (Signed)
  Subjective:  Patient ID: Alexis Ball, female    DOB: 1955/09/12,  MRN: [id number]  Chief Complaint  Patient presents with  . Routine Post Op    POV#3 DOS 6.25.2021 KELLER BUNION IMPLANT LT, EXC GANGLION CYST LT. Pt states some drainage, nausea/dizziness. Denies fever/chills/vomiting.    64 y.o. female presents with the above complaint. History confirmed with patient. Is  concerned about the care she is receiving at her facility.  Would like to go home.  States that she is working well physical therapy.  Receiving her antibiotics.  Thinks the wound is improving. denies new issues  Objective:  Physical Exam: warm, good capillary refill, no trophic changes or ulcerative lesions, normal DP and PT pulses and normal sensory exam. Left Foot: Left first metatarsal incision healing well no longer with exposed tendon but with ~0.6 x 1 cm remaining wound with granular wound base.  No warmth no erythema no signs of acute infection.  Wound over the second metatarsal area fully healed.  Fixator intact without loosening  Assessment:   1. Acute osteomyelitis of left ankle or foot ([place])   2. Postoperative wound dehiscence, subsequent encounter    Plan:  Patient was evaluated and treated and all questions answered.  Osteomyelitis status post resection of the joint with antibiotic spacer -Remaining staples removed.  Wound thoroughly cleansed and mechanically debrided with gauze -Overall doing well.  Is due for completion of antibiotics within the next couple weeks. -Plan for antibiotic spacer removal possible exchange in 2 weeks.  Plan for new biopsies at that time including possible frozen section.  We will consider removal of the fixator depending upon appearance and stability of the joint -Refill oxycodone.  Current prescription supplied to patient -I think she is okay for discharge to home -Consent forms reviewed for procedure to be performed in 2 weeks. --Patient has failed all conservative  therapy and wishes to proceed with surgical intervention. All risks, benefits, and alternatives discussed with patient. No guarantees given. Consent reviewed and signed by patient. -Planned procedures: Removal of antibiotic spacer, possible exchange, bone biopsy, possible removal of external fixator    No follow-ups on file.

## 2019-11-21 DIAGNOSIS — Z4889 Encounter for other specified surgical aftercare: Secondary | ICD-10-CM | POA: Diagnosis not present

## 2019-11-21 DIAGNOSIS — M6281 Muscle weakness (generalized): Secondary | ICD-10-CM | POA: Diagnosis not present

## 2019-11-21 DIAGNOSIS — S91302A Unspecified open wound, left foot, initial encounter: Secondary | ICD-10-CM | POA: Diagnosis not present

## 2019-11-22 ENCOUNTER — Other Ambulatory Visit: Payer: Self-pay

## 2019-11-22 ENCOUNTER — Encounter: Payer: Self-pay | Admitting: Internal Medicine

## 2019-11-22 ENCOUNTER — Ambulatory Visit (INDEPENDENT_AMBULATORY_CARE_PROVIDER_SITE_OTHER): Payer: Medicare HMO | Admitting: Internal Medicine

## 2019-11-22 DIAGNOSIS — Z5181 Encounter for therapeutic drug level monitoring: Secondary | ICD-10-CM

## 2019-11-22 DIAGNOSIS — M86171 Other acute osteomyelitis, right ankle and foot: Secondary | ICD-10-CM | POA: Diagnosis not present

## 2019-11-22 DIAGNOSIS — Z452 Encounter for adjustment and management of vascular access device: Secondary | ICD-10-CM

## 2019-11-22 NOTE — Progress Notes (Signed)
° °  Subjective:    Patient ID: Alexis Ball, female    DOB: 1955-10-03, 64 y.o.   MRN: [id number]  HPI Here for hsfu She recently underwent bunionectomy and silicone implant in her left foot by Dr. Milinda Pointer and presented with a post op infection.  MRI c/w osteomyelitis and underwent hardware removal and debridement and placed on ertapenem for a projected 6 weeks for the positive Enterobacter cloacae and Finegoldia magna.  No labs provided to me by the facility where she is at.  She is doing well and healing.  Saw Dr. March Rummage 2 days ago and no concerns.  She is eager to leave her facility.     Review of Systems  Constitutional: Negative for fatigue.  Gastrointestinal: Negative for diarrhea and nausea.  Skin: Negative for rash.       Objective:   Physical Exam Musculoskeletal:     Comments: Foot wrapped/pinned  Skin:    Findings: No rash.  Neurological:     General: No focal deficit present.     Mental Status: She is alert.  Psychiatric:        Mood and Affect: Mood normal.   SH: no tobacco        Assessment & Plan:

## 2019-11-23 ENCOUNTER — Telehealth: Payer: Self-pay

## 2019-11-23 ENCOUNTER — Encounter: Payer: Self-pay | Admitting: Internal Medicine

## 2019-11-23 DIAGNOSIS — Z5181 Encounter for therapeutic drug level monitoring: Secondary | ICD-10-CM | POA: Insufficient documentation

## 2019-11-23 DIAGNOSIS — Z452 Encounter for adjustment and management of vascular access device: Secondary | ICD-10-CM | POA: Insufficient documentation

## 2019-11-23 NOTE — Assessment & Plan Note (Signed)
Will contact facility for a copy of recent labs.

## 2019-11-23 NOTE — Telephone Encounter (Signed)
Per case management note patient is at [facility] facility. Called facility for lab work to be faxed to office for provider review; left voicemail for callback.   Rosser [place] Lorita Officer, RN

## 2019-11-23 NOTE — Assessment & Plan Note (Signed)
She is doing well clinically.  I have not received any inflammatory markers from the facility but will contact them.  She appears to be on track to complete antibiotics on 8/18 and the picc line can be removed.  Orders given to the facility.

## 2019-11-23 NOTE — Telephone Encounter (Signed)
-----  Message from Thayer Headings, MD sent at 11/23/2019  9:40 AM EDT ----- Can you see if her facility can send [place] her recent labs including ESR and CRP?  thanks

## 2019-11-23 NOTE — Telephone Encounter (Signed)
[place] requesting lab work to be faxed to office. Left voicemail. Awaiting call back + labs.  Alexis Ball Lorita Officer, RN

## 2019-11-23 NOTE — Assessment & Plan Note (Signed)
Working well, no issues.  Some irritation from the tape.  Will be removed after the last dose 8/18

## 2019-11-26 DIAGNOSIS — I639 Cerebral infarction, unspecified: Secondary | ICD-10-CM | POA: Diagnosis not present

## 2019-11-26 DIAGNOSIS — I10 Essential (primary) hypertension: Secondary | ICD-10-CM | POA: Diagnosis not present

## 2019-11-26 DIAGNOSIS — H409 Unspecified glaucoma: Secondary | ICD-10-CM | POA: Diagnosis not present

## 2019-11-26 DIAGNOSIS — T8149XA Infection following a procedure, other surgical site, initial encounter: Secondary | ICD-10-CM | POA: Diagnosis not present

## 2019-11-27 ENCOUNTER — Other Ambulatory Visit: Payer: Self-pay

## 2019-11-27 ENCOUNTER — Ambulatory Visit (INDEPENDENT_AMBULATORY_CARE_PROVIDER_SITE_OTHER): Payer: Medicare HMO | Admitting: Podiatry

## 2019-11-27 ENCOUNTER — Ambulatory Visit (INDEPENDENT_AMBULATORY_CARE_PROVIDER_SITE_OTHER): Payer: Medicare HMO

## 2019-11-27 ENCOUNTER — Other Ambulatory Visit: Payer: Self-pay | Admitting: Podiatry

## 2019-11-27 DIAGNOSIS — T8484XA Pain due to internal orthopedic prosthetic devices, implants and grafts, initial encounter: Secondary | ICD-10-CM

## 2019-11-27 DIAGNOSIS — M79672 Pain in left foot: Secondary | ICD-10-CM

## 2019-11-27 DIAGNOSIS — R2681 Unsteadiness on feet: Secondary | ICD-10-CM | POA: Diagnosis not present

## 2019-11-27 DIAGNOSIS — M6281 Muscle weakness (generalized): Secondary | ICD-10-CM | POA: Diagnosis not present

## 2019-11-27 DIAGNOSIS — R278 Other lack of coordination: Secondary | ICD-10-CM | POA: Diagnosis not present

## 2019-11-27 DIAGNOSIS — L02612 Cutaneous abscess of left foot: Secondary | ICD-10-CM | POA: Diagnosis not present

## 2019-11-27 MED ORDER — OXYCODONE HCL 10 MG PO TABS: 10.0000 mg | ORAL_TABLET | Freq: Three times a day (TID) | ORAL | 0 refills | Status: DC | PRN

## 2019-11-27 NOTE — Progress Notes (Signed)
  Subjective:  Patient ID: Alexis Ball, female    DOB: [birth year],  MRN: [id number]  No chief complaint on file.  64 y.o. female presents with the above complaint. States she hit her foot and noticed the fixator moved. Denies redness and drainage. Still has lots of pain not getting pain medication at her facility. States that she is due to be d/ced tomorrow. They would not D/c while she has PICC. PICC also due to be pulled tomorrow.  Objective:  Physical Exam: warm, good capillary refill, no trophic changes or ulcerative lesions, normal DP and PT pulses and normal sensory exam. Left Foot: All wounds healed. Pin sites without signs of infection.  Assessment:   1. Painful orthopaedic hardware ([place])   2. Pain in left foot    Plan:  Patient was evaluated and treated and all questions answered.  Osteomyelitis status post resection of the joint with antibiotic spacer -XR taken today. -Based upon appearance today of her XR, plan for removal of the fixator with possible abx spacer. Discussed leaving the spacer in depending upon ROM after fixator removal. Will decide intra-op. Patient verbalized understanding and agreement with plan. -Pt would benefit from d/c from facility. -Pull PICC as directed  No follow-ups on file.

## 2019-11-27 NOTE — H&P (View-Only) (Signed)
°  Subjective:  Patient ID: Alexis Ball, female    DOB: 09-May-1955,  MRN: [id number]  No chief complaint on file.  64 y.o. female presents with the above complaint. States she hit her foot and noticed the fixator moved. Denies redness and drainage. Still has lots of pain not getting pain medication at her facility. States that she is due to be d/ced tomorrow. They would not D/c while she has PICC. PICC also due to be pulled tomorrow.  Objective:  Physical Exam: warm, good capillary refill, no trophic changes or ulcerative lesions, normal DP and PT pulses and normal sensory exam. Left Foot: All wounds healed. Pin sites without signs of infection.  Assessment:   1. Painful orthopaedic hardware ([place])   2. Pain in left foot    Plan:  Patient was evaluated and treated and all questions answered.  Osteomyelitis status post resection of the joint with antibiotic spacer -XR taken today. -Based upon appearance today of her XR, plan for removal of the fixator with possible abx spacer. Discussed leaving the spacer in depending upon ROM after fixator removal. Will decide intra-op. Patient verbalized understanding and agreement with plan. -Pt would benefit from d/c from facility. -Pull PICC as directed  No follow-ups on file.

## 2019-11-28 ENCOUNTER — Telehealth: Payer: Self-pay | Admitting: Podiatry

## 2019-11-28 ENCOUNTER — Telehealth: Payer: Self-pay | Admitting: *Deleted

## 2019-11-28 DIAGNOSIS — I10 Essential (primary) hypertension: Secondary | ICD-10-CM | POA: Diagnosis not present

## 2019-11-28 DIAGNOSIS — Z01818 Encounter for other preprocedural examination: Secondary | ICD-10-CM

## 2019-11-28 DIAGNOSIS — H409 Unspecified glaucoma: Secondary | ICD-10-CM | POA: Diagnosis not present

## 2019-11-28 DIAGNOSIS — T8149XA Infection following a procedure, other surgical site, initial encounter: Secondary | ICD-10-CM | POA: Diagnosis not present

## 2019-11-28 DIAGNOSIS — I639 Cerebral infarction, unspecified: Secondary | ICD-10-CM | POA: Diagnosis not present

## 2019-11-28 NOTE — Telephone Encounter (Signed)
DOS: 08.25.2021   Removal of Antibiotic Spacer Lt (26333) Poss. Exchange of Antibiotic Spacer Lt [phone]) Bone Biopsy Lt (20220) Poss. Removal of External Fixator Lt [phone])  [employer] Effective: 09/11/2018 -  Deductible: $373 with $203 met and $0 remaining. Out of Pocket: $7,550 with $0 met and $7,550 remains. CoInsurance: 100% Copay: $0.  Per Mickel Baas, no prior authorization required. Call Ref# [id number].

## 2019-11-29 ENCOUNTER — Encounter (HOSPITAL_BASED_OUTPATIENT_CLINIC_OR_DEPARTMENT_OTHER): Payer: Self-pay | Admitting: Podiatry

## 2019-11-29 ENCOUNTER — Other Ambulatory Visit: Payer: Self-pay

## 2019-11-29 DIAGNOSIS — I10 Essential (primary) hypertension: Secondary | ICD-10-CM | POA: Diagnosis not present

## 2019-11-29 DIAGNOSIS — M86172 Other acute osteomyelitis, left ankle and foot: Secondary | ICD-10-CM | POA: Diagnosis not present

## 2019-11-29 DIAGNOSIS — E1169 Type 2 diabetes mellitus with other specified complication: Secondary | ICD-10-CM | POA: Diagnosis not present

## 2019-11-29 DIAGNOSIS — R69 Illness, unspecified: Secondary | ICD-10-CM | POA: Diagnosis not present

## 2019-11-29 NOTE — Progress Notes (Addendum)
ADDENDUM:  Received pt'' pcp H&P dated 11-29-2019 via fax from Dr March Rummage office, placed with chart.  ADDENDUM:  Called and spoke w/ pt via phone to inform her of her procedure time change.  Pt verbalized understanding to arrive @ 0700 and be npo after mn w/ exception clear liquids until 0600.  Awaiting on pt's H&P.   Spoke w/ via phone for pre-op interview--- PT Lab needs dos---- Istat              Lab results------ current ekg in epic/ chart COVID test ------ 12-01-2019 @ 0920 Arrive at ------- 0800 NPO after MN NO Solid Food.  Clear liquids from MN until--- 0700 Medications to take morning of surgery ----- Gabapentin w/ sips of water and do eye drops as usual Diabetic medication ----- n/a Patient Special Instructions ----- n/a Pre-Op special Istructions ----- pt using walker due to no weight bearing left foot, has external fixator.   Patient verbalized understanding of instructions that were given at this phone interview. Patient denies shortness of breath, chest pain, fever, cough at this phone interview.   Anesthesia :  Hx HTN, CVA without deficits, acute osteomyelitis left foot, admission 10-14-2019 discharged to rehab facility 11-08-2019 and discharged from facility 11-28-2019 along with last dose IV antibiotic/ PICC removed.  Pt denies any cardiac/ stroke s&s.  PCP: Dr Criss Rosales Cardiologist :  No ID:  Dr Alfonso Patten. [place] (lov 11-20-2019 epic) Chest x-ray : 03-07-2014 epic EKG : 10-31-2019 epic Echo : no Stress test: no Cardiac Cath :  10-16-2004 epic Activity level:  Pt has limited mobility due to no weight bearing left foot, stated when really exerts self , gets sob Sleep Study/ CPAP :  NO Fasting Blood Sugar :      / Checks Blood Sugar -- times a day:   N/A Blood Thinner/ Instructions /Last Dose:  NO ASA / Instructions/ Last Dose : ASA 81mg /  Per pt was given any instructions if need to stop or not.  Pt verbalized understanding to ask her pcp and call dr price office

## 2019-11-29 NOTE — Telephone Encounter (Signed)
You just called me today about scheduling my sx for 8/25. I need to know what kind of physical you are talking about I need to have for my sx as I'm at my doctor's office. Please call me back today.

## 2019-12-01 ENCOUNTER — Other Ambulatory Visit (HOSPITAL_COMMUNITY)
Admission: RE | Admit: 2019-12-01 | Discharge: 2019-12-01 | Disposition: A | Discharge status: Home / Self Care | Payer: Medicare HMO | Source: Ambulatory Visit | Attending: Podiatry | Admitting: Podiatry

## 2019-12-01 DIAGNOSIS — Z20822 Contact with and (suspected) exposure to covid-19: Secondary | ICD-10-CM | POA: Diagnosis not present

## 2019-12-01 DIAGNOSIS — Z01812 Encounter for preprocedural laboratory examination: Secondary | ICD-10-CM | POA: Diagnosis not present

## 2019-12-01 LAB — SARS CORONAVIRUS 2 (TAT 6-24 HRS): SARS Coronavirus 2: NEGATIVE

## 2019-12-03 NOTE — Telephone Encounter (Signed)
I attempted to call the patient in regards to her history and physical form.  I left her a message to call me back.  She saw Dr. Criss Rosales on 11/29/2019.  I faxed the 11/27/2019 clinical note to Dr. Criss Rosales electronically.

## 2019-12-04 DIAGNOSIS — Z79891 Long term (current) use of opiate analgesic: Secondary | ICD-10-CM | POA: Diagnosis not present

## 2019-12-04 DIAGNOSIS — H409 Unspecified glaucoma: Secondary | ICD-10-CM | POA: Diagnosis not present

## 2019-12-04 DIAGNOSIS — Z7982 Long term (current) use of aspirin: Secondary | ICD-10-CM | POA: Diagnosis not present

## 2019-12-04 DIAGNOSIS — G8929 Other chronic pain: Secondary | ICD-10-CM | POA: Diagnosis not present

## 2019-12-04 DIAGNOSIS — R7303 Prediabetes: Secondary | ICD-10-CM | POA: Diagnosis not present

## 2019-12-04 DIAGNOSIS — M25562 Pain in left knee: Secondary | ICD-10-CM | POA: Diagnosis not present

## 2019-12-04 DIAGNOSIS — I82611 Acute embolism and thrombosis of superficial veins of right upper extremity: Secondary | ICD-10-CM | POA: Diagnosis not present

## 2019-12-04 DIAGNOSIS — M25561 Pain in right knee: Secondary | ICD-10-CM | POA: Diagnosis not present

## 2019-12-04 DIAGNOSIS — T8141XD Infection following a procedure, superficial incisional surgical site, subsequent encounter: Secondary | ICD-10-CM | POA: Diagnosis not present

## 2019-12-04 DIAGNOSIS — I10 Essential (primary) hypertension: Secondary | ICD-10-CM | POA: Diagnosis not present

## 2019-12-05 ENCOUNTER — Encounter (HOSPITAL_BASED_OUTPATIENT_CLINIC_OR_DEPARTMENT_OTHER)
Admission: RE | Disposition: A | Discharge status: Home / Self Care | Payer: Self-pay | Source: Home / Self Care | Attending: Podiatry

## 2019-12-05 ENCOUNTER — Ambulatory Visit (HOSPITAL_BASED_OUTPATIENT_CLINIC_OR_DEPARTMENT_OTHER): Payer: Medicare HMO | Admitting: Anesthesiology

## 2019-12-05 ENCOUNTER — Ambulatory Visit (HOSPITAL_COMMUNITY): Payer: Medicare HMO

## 2019-12-05 ENCOUNTER — Encounter (HOSPITAL_BASED_OUTPATIENT_CLINIC_OR_DEPARTMENT_OTHER): Payer: Self-pay | Admitting: Podiatry

## 2019-12-05 ENCOUNTER — Ambulatory Visit (HOSPITAL_BASED_OUTPATIENT_CLINIC_OR_DEPARTMENT_OTHER)
Admission: RE | Admit: 2019-12-05 | Discharge: 2019-12-05 | Disposition: A | Discharge status: Home / Self Care | Payer: Medicare HMO | Attending: Podiatry | Admitting: Podiatry

## 2019-12-05 DIAGNOSIS — Z4789 Encounter for other orthopedic aftercare: Secondary | ICD-10-CM | POA: Diagnosis not present

## 2019-12-05 DIAGNOSIS — T8130XA Disruption of wound, unspecified, initial encounter: Secondary | ICD-10-CM | POA: Diagnosis not present

## 2019-12-05 DIAGNOSIS — M86172 Other acute osteomyelitis, left ankle and foot: Secondary | ICD-10-CM | POA: Insufficient documentation

## 2019-12-05 DIAGNOSIS — S91302A Unspecified open wound, left foot, initial encounter: Secondary | ICD-10-CM | POA: Diagnosis not present

## 2019-12-05 DIAGNOSIS — T8131XA Disruption of external operation (surgical) wound, not elsewhere classified, initial encounter: Secondary | ICD-10-CM | POA: Diagnosis not present

## 2019-12-05 DIAGNOSIS — X58XXXA Exposure to other specified factors, initial encounter: Secondary | ICD-10-CM | POA: Insufficient documentation

## 2019-12-05 DIAGNOSIS — T8484XA Pain due to internal orthopedic prosthetic devices, implants and grafts, initial encounter: Secondary | ICD-10-CM | POA: Insufficient documentation

## 2019-12-05 DIAGNOSIS — M869 Osteomyelitis, unspecified: Secondary | ICD-10-CM

## 2019-12-05 DIAGNOSIS — I639 Cerebral infarction, unspecified: Secondary | ICD-10-CM | POA: Diagnosis not present

## 2019-12-05 DIAGNOSIS — I10 Essential (primary) hypertension: Secondary | ICD-10-CM | POA: Diagnosis not present

## 2019-12-05 DIAGNOSIS — Z9889 Other specified postprocedural states: Secondary | ICD-10-CM

## 2019-12-05 HISTORY — DX: Rash and other nonspecific skin eruption: R21

## 2019-12-05 HISTORY — DX: Personal history of other specified conditions: Z87.898

## 2019-12-05 HISTORY — DX: Unspecified osteoarthritis, unspecified site: M19.90

## 2019-12-05 HISTORY — DX: Personal history of diseases of the skin and subcutaneous tissue: Z87.2

## 2019-12-05 HISTORY — DX: Migraine, unspecified, not intractable, without status migrainosus: G43.909

## 2019-12-05 HISTORY — DX: Polyneuropathy, unspecified: G62.9

## 2019-12-05 HISTORY — DX: Gastro-esophageal reflux disease without esophagitis: K21.9

## 2019-12-05 HISTORY — DX: Personal history of transient ischemic attack (TIA), and cerebral infarction without residual deficits: Z86.73

## 2019-12-05 HISTORY — PX: HARDWARE REMOVAL: SHX979

## 2019-12-05 HISTORY — DX: Unspecified glaucoma: H40.9

## 2019-12-05 HISTORY — DX: Presence of dental prosthetic device (complete) (partial): K08.109

## 2019-12-05 LAB — POCT I-STAT, CHEM 8
BUN: 16 mg/dL (ref 8–23)
Calcium, Ion: 1.31 mmol/L (ref 1.15–1.40)
Chloride: 102 mmol/L (ref 98–111)
Creatinine, Ser: 0.7 mg/dL (ref 0.44–1.00)
Glucose, Bld: 100 mg/dL — ABNORMAL HIGH (ref 70–99)
HCT: 37 % (ref 36.0–46.0)
Hemoglobin: 12.6 g/dL (ref 12.0–15.0)
Potassium: 4.1 mmol/L (ref 3.5–5.1)
Sodium: 141 mmol/L (ref 135–145)
TCO2: 26 mmol/L (ref 22–32)

## 2019-12-05 SURGERY — REMOVAL, HARDWARE
Anesthesia: General | Site: Foot | Laterality: Left

## 2019-12-05 MED ORDER — ONDANSETRON HCL 4 MG/2ML IJ SOLN
INTRAMUSCULAR | Status: AC
  Filled 2019-12-05: qty 2

## 2019-12-05 MED ORDER — DEXAMETHASONE SODIUM PHOSPHATE 10 MG/ML IJ SOLN
INTRAMUSCULAR | Status: AC
  Filled 2019-12-05: qty 1

## 2019-12-05 MED ORDER — CEPHALEXIN 500 MG PO CAPS: 500.0000 mg | ORAL_CAPSULE | Freq: Two times a day (BID) | ORAL | 0 refills | Status: DC

## 2019-12-05 MED ORDER — OXYCODONE-ACETAMINOPHEN 10-325 MG PO TABS: 1.0000 | ORAL_TABLET | ORAL | 0 refills | Status: DC | PRN

## 2019-12-05 MED ORDER — PROPOFOL 10 MG/ML IV BOLUS
INTRAVENOUS | Status: AC
  Filled 2019-12-05: qty 20

## 2019-12-05 MED ORDER — FENTANYL CITRATE (PF) 100 MCG/2ML IJ SOLN: 25.0000 ug | INTRAMUSCULAR | Status: DC | PRN

## 2019-12-05 MED ORDER — LIDOCAINE 2% (20 MG/ML) 5 ML SYRINGE
INTRAMUSCULAR | Status: AC
  Filled 2019-12-05: qty 5

## 2019-12-05 MED ORDER — DEXAMETHASONE SODIUM PHOSPHATE 10 MG/ML IJ SOLN
INTRAMUSCULAR | Status: DC | PRN
  Administered 2019-12-05: 4 mg via INTRAVENOUS

## 2019-12-05 MED ORDER — ONDANSETRON HCL 4 MG/2ML IJ SOLN
INTRAMUSCULAR | Status: DC | PRN
  Administered 2019-12-05: 4 mg via INTRAVENOUS

## 2019-12-05 MED ORDER — MIDAZOLAM HCL 2 MG/2ML IJ SOLN
INTRAMUSCULAR | Status: AC
  Filled 2019-12-05: qty 2

## 2019-12-05 MED ORDER — KETOROLAC TROMETHAMINE 30 MG/ML IJ SOLN
INTRAMUSCULAR | Status: DC | PRN
  Administered 2019-12-05: 30 mg via INTRAVENOUS

## 2019-12-05 MED ORDER — FENTANYL CITRATE (PF) 100 MCG/2ML IJ SOLN
INTRAMUSCULAR | Status: AC
  Filled 2019-12-05: qty 2

## 2019-12-05 MED ORDER — BUPIVACAINE HCL (PF) 0.5 % IJ SOLN
INTRAMUSCULAR | Status: DC | PRN
  Administered 2019-12-05: 10 mL

## 2019-12-05 MED ORDER — ACETAMINOPHEN 500 MG PO TABS
1000.0000 mg | ORAL_TABLET | Freq: Once | ORAL | Status: AC
  Administered 2019-12-05: 1000 mg via ORAL

## 2019-12-05 MED ORDER — LIDOCAINE 2% (20 MG/ML) 5 ML SYRINGE
INTRAMUSCULAR | Status: DC | PRN
  Administered 2019-12-05: 100 mg via INTRAVENOUS

## 2019-12-05 MED ORDER — ACETAMINOPHEN 500 MG PO TABS
ORAL_TABLET | ORAL | Status: AC
  Filled 2019-12-05: qty 2

## 2019-12-05 MED ORDER — KETOROLAC TROMETHAMINE 30 MG/ML IJ SOLN
INTRAMUSCULAR | Status: AC
  Filled 2019-12-05: qty 1

## 2019-12-05 MED ORDER — LACTATED RINGERS IV SOLN: INTRAVENOUS | Status: DC

## 2019-12-05 MED ORDER — PROPOFOL 10 MG/ML IV BOLUS
INTRAVENOUS | Status: DC | PRN
  Administered 2019-12-05: 160 mg via INTRAVENOUS

## 2019-12-05 MED ORDER — FENTANYL CITRATE (PF) 100 MCG/2ML IJ SOLN
INTRAMUSCULAR | Status: DC | PRN
Start: 2019-12-05 — End: 2019-12-05
  Administered 2019-12-05: 75 ug via INTRAVENOUS
  Administered 2019-12-05: 25 ug via INTRAVENOUS

## 2019-12-05 SURGICAL SUPPLY — 56 items
APL PRP STRL LF ISPRP CHG 10.5 (MISCELLANEOUS) ×2
APPLICATOR CHLORAPREP 10.5 ORG (MISCELLANEOUS) ×4 IMPLANT
BLADE SURG 15 STRL LF DISP TIS (BLADE) ×4 IMPLANT
BLADE SURG 15 STRL SS (BLADE) ×8
BNDG CMPR 9X4 STRL LF SNTH (GAUZE/BANDAGES/DRESSINGS) ×2
BNDG CONFORM 2 STRL LF (GAUZE/BANDAGES/DRESSINGS) IMPLANT
BNDG ELASTIC 3X5.8 VLCR STR LF (GAUZE/BANDAGES/DRESSINGS) ×4 IMPLANT
BNDG ESMARK 4X9 LF (GAUZE/BANDAGES/DRESSINGS) ×4 IMPLANT
BNDG GAUZE ELAST 4 BULKY (GAUZE/BANDAGES/DRESSINGS) ×4 IMPLANT
COVER BACK TABLE 60X90IN (DRAPES) ×4 IMPLANT
COVER WAND RF STERILE (DRAPES) ×4 IMPLANT
CUFF TOURN SGL QUICK 18X4 (TOURNIQUET CUFF) ×4 IMPLANT
DRAPE 3/4 80X56 (DRAPES) ×4 IMPLANT
DRAPE EXTREMITY T 121X128X90 (DISPOSABLE) ×4 IMPLANT
DRAPE OEC MINIVIEW 54X84 (DRAPES) ×4 IMPLANT
DRSG EMULSION OIL 3X3 NADH (GAUZE/BANDAGES/DRESSINGS) ×4 IMPLANT
DURAPREP 26ML APPLICATOR (WOUND CARE) IMPLANT
ELECT REM PT RETURN 9FT ADLT (ELECTROSURGICAL) ×4
ELECTRODE REM PT RTRN 9FT ADLT (ELECTROSURGICAL) ×2 IMPLANT
GAUZE 4X4 16PLY RFD (DISPOSABLE) ×4 IMPLANT
GAUZE SPONGE 4X4 12PLY STRL (GAUZE/BANDAGES/DRESSINGS) ×4 IMPLANT
GAUZE SPONGE 4X4 12PLY STRL LF (GAUZE/BANDAGES/DRESSINGS) ×4 IMPLANT
GAUZE XEROFORM 1X8 LF (GAUZE/BANDAGES/DRESSINGS) ×4 IMPLANT
GLOVE BIO SURGEON STRL SZ7.5 (GLOVE) ×8 IMPLANT
GLOVE BIOGEL PI IND STRL 8 (GLOVE) ×2 IMPLANT
GLOVE BIOGEL PI INDICATOR 8 (GLOVE) ×2
GOWN STRL REUS W/ TWL LRG LVL3 (GOWN DISPOSABLE) ×2 IMPLANT
GOWN STRL REUS W/ TWL XL LVL3 (GOWN DISPOSABLE) ×2 IMPLANT
GOWN STRL REUS W/TWL LRG LVL3 (GOWN DISPOSABLE) ×4
GOWN STRL REUS W/TWL XL LVL3 (GOWN DISPOSABLE) ×4
NDL SAFETY ECLIPSE 18X1.5 (NEEDLE) IMPLANT
NEEDLE HYPO 18GX1.5 SHARP (NEEDLE)
NEEDLE HYPO 25X1 1.5 SAFETY (NEEDLE) ×4 IMPLANT
NS IRRIG 1000ML POUR BTL (IV SOLUTION) IMPLANT
PACK BASIN DAY SURGERY FS (CUSTOM PROCEDURE TRAY) ×4 IMPLANT
PADDING CAST ABS 4INX4YD NS (CAST SUPPLIES)
PADDING CAST ABS COTTON 4X4 ST (CAST SUPPLIES) IMPLANT
PENCIL SMOKE EVACUATOR (MISCELLANEOUS) ×4 IMPLANT
SPONGE LAP 4X18 RFD (DISPOSABLE) IMPLANT
STAPLER VISISTAT 35W (STAPLE) ×4 IMPLANT
STOCKINETTE 6  STRL (DRAPES) ×4
STOCKINETTE 6 STRL (DRAPES) ×2 IMPLANT
SUCTION FRAZIER HANDLE 10FR (MISCELLANEOUS) ×4
SUCTION TUBE FRAZIER 10FR DISP (MISCELLANEOUS) ×2 IMPLANT
SUT ETHILON 4 0 PS 2 18 (SUTURE) ×8 IMPLANT
SUT MERSILENE 4 0 P 3 (SUTURE) IMPLANT
SUT MNCRL AB 3-0 PS2 18 (SUTURE) IMPLANT
SUT MNCRL AB 4-0 PS2 18 (SUTURE) ×4 IMPLANT
SUT MON AB 5-0 PS2 18 (SUTURE) IMPLANT
SUT PROLENE 4 0 PS 2 18 (SUTURE) IMPLANT
SYR 10ML LL (SYRINGE) IMPLANT
SYR BULB EAR ULCER 3OZ GRN STR (SYRINGE) IMPLANT
SYR CONTROL 10ML LL (SYRINGE) ×4 IMPLANT
TUBE CONNECTING 12'X1/4 (SUCTIONS) ×1
TUBE CONNECTING 12X1/4 (SUCTIONS) ×3 IMPLANT
UNDERPAD 30X36 HEAVY ABSORB (UNDERPADS AND DIAPERS) ×4 IMPLANT

## 2019-12-05 NOTE — Anesthesia Preprocedure Evaluation (Addendum)
Anesthesia Evaluation  Patient identified by MRN, date of birth, ID band Patient awake    Reviewed: Allergy & Precautions, NPO status , Patient's Chart, lab work & pertinent test results  Airway Mallampati: II  TM Distance: >3 FB Neck ROM: Full    Dental  (+) Edentulous Upper, Edentulous Lower, Dental Advisory Given   Pulmonary neg pulmonary ROS,    Pulmonary exam normal breath sounds clear to auscultation       Cardiovascular hypertension, Pt. on medications negative cardio ROS Normal cardiovascular exam Rhythm:Regular Rate:Normal     Neuro/Psych CVA, No Residual Symptoms negative psych ROS   GI/Hepatic Neg liver ROS, GERD  Controlled and Medicated,  Endo/Other  negative endocrine ROS  Renal/GU negative Renal ROS  negative genitourinary   Musculoskeletal  (+) Arthritis ,   Abdominal   Peds  Hematology negative hematology ROS (+)   Anesthesia Other Findings   Reproductive/Obstetrics                           Anesthesia Physical Anesthesia Plan  ASA: III  Anesthesia Plan: General   Post-op Pain Management:    Induction: Intravenous  PONV Risk Score and Plan: 3 and Ondansetron, Dexamethasone and Midazolam  Airway Management Planned: LMA  Additional Equipment:   Intra-op Plan:   Post-operative Plan: Extubation in OR  Informed Consent: I have reviewed the patients History and Physical, chart, labs and discussed the procedure including the risks, benefits and alternatives for the proposed anesthesia with the patient or authorized representative who has indicated his/her understanding and acceptance.     Dental advisory given  Plan Discussed with: CRNA  Anesthesia Plan Comments:         Anesthesia Quick Evaluation

## 2019-12-05 NOTE — Transfer of Care (Signed)
Immediate Anesthesia Transfer of Care Note  Patient: Alexis Ball  Procedure(s) Performed: HARDWARE REMOVAL ,WOUND CLOSURE OF FIRST TOE (Left Foot)  Patient Location: PACU  Anesthesia Type:General  Level of Consciousness: sedated  Airway & Oxygen Therapy: Patient Spontanous Breathing and Patient connected to nasal cannula oxygen  Post-op Assessment: Report given to RN  Post vital signs: Reviewed and stable  Last Vitals:  Vitals Value Taken Time  BP 128/78   Temp    Pulse 64 12/05/19 1051  Resp 11 12/05/19 1051  SpO2 98 % 12/05/19 1051  Vitals shown include unvalidated device data.  Last Pain:  Vitals:   12/05/19 0745  TempSrc: Oral      Patients Stated Pain Goal: 3 (75/43/60 6770)  Complications: No complications documented.

## 2019-12-05 NOTE — Anesthesia Procedure Notes (Signed)
Procedure Name: LMA Insertion Date/Time: 12/05/2019 10:00 AM Performed by: Bonney Aid, CRNA Pre-anesthesia Checklist: Patient identified, Emergency Drugs available, Suction available and Patient being monitored Patient Re-evaluated:Patient Re-evaluated prior to induction Oxygen Delivery Method: Circle system utilized Preoxygenation: Pre-oxygenation with 100% oxygen Induction Type: IV induction Ventilation: Mask ventilation without difficulty LMA: LMA inserted LMA Size: 4.0 Number of attempts: 1 Airway Equipment and Method: Bite block Placement Confirmation: positive ETCO2 Tube secured with: Tape Dental Injury: Teeth and Oropharynx as per pre-operative assessment

## 2019-12-05 NOTE — Interval H&P Note (Signed)
History and Physical Interval Note:  12/05/2019 9:44 AM  Tru F Shuster  has presented today for surgery, with the diagnosis of ACUTE OSTEOMYELITIS AND POST WOUND DEHISCENCE.  The various methods of treatment have been discussed with the patient and family. After consideration of risks, benefits and other options for treatment, the patient has consented to  Procedure(s): HARDWARE REMOVAL ANTIBIOTIC SPACER-POSSIBLE EXCHANGE,AND POSSIBLE EXTERNAL FIXATER REMOVAL (Left) BONE BIOPSY (Left) as a surgical intervention.  The patient's history has been reviewed, patient examined, no change in status, stable for surgery.  I have reviewed the patient's chart and labs.  Questions were answered to the patient's satisfaction.     Evelina Bucy

## 2019-12-05 NOTE — Discharge Instructions (Signed)
  After Surgery Instructions   1) If you are recuperating from surgery anywhere other than home, please be sure to leave [place] the number where you can be reached.  2) Go directly home and rest.  3) Keep the operated foot(feet) elevated six inches above the hip when sitting or lying down. This will help control swelling and pain.  4) Support the elevated foot and leg with pillows. DO NOT PLACE PILLOWS UNDER THE KNEE.  5) DO NOT REMOVE or get your bandages WET, unless you were given different instructions by your doctor to do so. This increases the risk of infection.  6) Wear your surgical shoe or surgical boot at all times when you are up on your feet.  7) A limited amount of pain and swelling may occur. The skin may take on a bruised appearance. DO NOT BE ALARMED, THIS IS NORMAL.  8) For slight pain and swelling, apply an ice pack directly over the bandages for 15 minutes only out of each hour of the day. Continue until seen in the office for your first post op visit. DO NOT APPLY ANY FORM OF HEAT TO THE AREA.  9) Have prescriptions filled immediately and take as directed.  10) Drink lots of liquids, water and juice to stay hydrated.  11) CALL IMMEDIATELY IF:  *Bleeding continues until the following day of surgery  *Pain increases and/or does not respond to medication  *Bandages or cast appears to tight  *If your bandage gets wet  *Trip, fall or stump your surgical foot  *If your temperature goes above 101  *If you have ANY questions at all  12) You are expected to be weightbearing after your surgery.   If you need to reach the nurse for any reason, please call: [place]: [phone] [place]: [phone] [place]: [phone]   Post Anesthesia Home Care Instructions  Activity: Get plenty of rest for the remainder of the day. A responsible individual must stay with you for 24 hours following the procedure.  For the next 24 hours, DO NOT: -Drive a  car -Paediatric nurse -Drink alcoholic beverages -Take any medication unless instructed by your physician -Make any legal decisions or sign important papers.  Meals: Start with liquid foods such as gelatin or soup. Progress to regular foods as tolerated. Avoid greasy, spicy, heavy foods. If nausea and/or vomiting occur, drink only clear liquids until the nausea and/or vomiting subsides. Call your physician if vomiting continues.  Special Instructions/Symptoms: Your throat may feel dry or sore from the anesthesia or the breathing tube placed in your throat during surgery. If this causes discomfort, gargle with warm salt water. The discomfort should disappear within 24 hours.  No additional Tylenol/acetaminophen until after 2:45 pm today if needed.  No ibuprofen, Advil, Aleve, Motrin, or naproxen until after 4:15 pm today if needed.

## 2019-12-05 NOTE — Op Note (Addendum)
  Patient Name: Alexis Ball DOB: 12/23/1955  MRN: [id number]   Date of Surgery: 12/05/2019  Surgeon: Dr. Hardie Pulley, DPM Assistants: none  Pre-operative Diagnosis:  Retained hardware, osteomyelitis hx, wound dehiscence Post-operative Diagnosis:  same Procedures:  1) Removal of external fixator left  2) Manipulation of joint under anesthesia  3) Repair of wound dehiscence dorsal 1st MPJ Pathology/Specimens: * No specimens in log * Anesthesia: non Hemostasis: anatomic Estimated Blood Loss: 2 mL Materials: * No implants in log * Medications: none Complications: none  Indications for Procedure:  This is a 64 y.o. female who previously had an eternal fixator applied due to infection of a 1st MPJ implant. She presents today for removal of the fixator with possible exchange of the antibiotic spacer.   Procedure in Detail: Patient was identified in pre-operative holding area. Formal consent was signed and the right lower extremity was marked. Patient was brought back to the operating room. Anesthesia was induced. The extremity was prepped and draped in the usual sterile fashion. Timeout was taken to confirm patient name, laterality, and procedure prior to incision.   Local anesthesia was infiltrated into the sites around the pins.   Attention was then directed to the left foot. The external fixator was removed from the associated pins. The 1st MPJ appeared stable, with some ROM of the 1st MPJ without crepitus. The joint was then gently manually manipulated to release scar tissue and promote ROM. At this point since the joint appeared stable the decision was made to keep the antibiotic spacer in place.  The wounds were irrigated, the pin sites were curettaged, and the skin sites were closed with 4-0 nylon.  Attention was directed to the dorsal 1st MPJ wound. The wound was debrided with a 15 blade and curette. There was exposed tendon deep in the wound. Layered closure was  performed with 3-0 monocryl, 4-0 nylon, and skin staples.   The foot was then dressed with xeroform, 4x4, kerlix, and ACE. Patient tolerated the procedure well.  Disposition: Following a period of post-operative monitoring, patient will be transferred home.

## 2019-12-05 NOTE — OR Nursing (Signed)
EXTERNAL FIXATOR REMOVED AT 1025 BY DR. March Rummage

## 2019-12-05 NOTE — Anesthesia Postprocedure Evaluation (Signed)
Anesthesia Post Note  Patient: Alexis Ball  Procedure(s) Performed: HARDWARE REMOVAL ,WOUND CLOSURE OF FIRST TOE (Left Foot)     Patient location during evaluation: PACU Anesthesia Type: General Level of consciousness: awake and alert Pain management: pain level controlled Vital Signs Assessment: post-procedure vital signs reviewed and stable Respiratory status: spontaneous breathing, nonlabored ventilation, respiratory function stable and patient connected to nasal cannula oxygen Cardiovascular status: blood pressure returned to baseline and stable Postop Assessment: no apparent nausea or vomiting Anesthetic complications: no   No complications documented.  Last Vitals:  Vitals:   12/05/19 1100 12/05/19 1115  BP: 117/70 127/75  Pulse: (!) 58 (!) 54  Resp: 11 11  Temp:    SpO2: 97% 99%    Last Pain:  Vitals:   12/05/19 1050  TempSrc:   PainSc: 0-No pain                 Caran Storck L Terel Bann

## 2019-12-06 ENCOUNTER — Encounter (HOSPITAL_BASED_OUTPATIENT_CLINIC_OR_DEPARTMENT_OTHER): Payer: Self-pay | Admitting: Podiatry

## 2019-12-06 ENCOUNTER — Encounter: Payer: Self-pay | Admitting: Podiatry

## 2019-12-06 DIAGNOSIS — Z7982 Long term (current) use of aspirin: Secondary | ICD-10-CM | POA: Diagnosis not present

## 2019-12-06 DIAGNOSIS — I82611 Acute embolism and thrombosis of superficial veins of right upper extremity: Secondary | ICD-10-CM | POA: Diagnosis not present

## 2019-12-06 DIAGNOSIS — Z79891 Long term (current) use of opiate analgesic: Secondary | ICD-10-CM | POA: Diagnosis not present

## 2019-12-06 DIAGNOSIS — R7303 Prediabetes: Secondary | ICD-10-CM | POA: Diagnosis not present

## 2019-12-06 DIAGNOSIS — I10 Essential (primary) hypertension: Secondary | ICD-10-CM | POA: Diagnosis not present

## 2019-12-06 DIAGNOSIS — M25562 Pain in left knee: Secondary | ICD-10-CM | POA: Diagnosis not present

## 2019-12-06 DIAGNOSIS — M25561 Pain in right knee: Secondary | ICD-10-CM | POA: Diagnosis not present

## 2019-12-06 DIAGNOSIS — G8929 Other chronic pain: Secondary | ICD-10-CM | POA: Diagnosis not present

## 2019-12-06 DIAGNOSIS — T8141XD Infection following a procedure, superficial incisional surgical site, subsequent encounter: Secondary | ICD-10-CM | POA: Diagnosis not present

## 2019-12-06 DIAGNOSIS — H409 Unspecified glaucoma: Secondary | ICD-10-CM | POA: Diagnosis not present

## 2019-12-06 NOTE — Interval H&P Note (Signed)
Anesthesia H&P Update: History and Physical Exam reviewed; patient is OK for planned anesthetic and procedure. ? ?

## 2019-12-06 NOTE — Addendum Note (Signed)
Addendum  created 12/06/19 2012 by Freddrick March, MD   Clinical Note Signed

## 2019-12-07 ENCOUNTER — Emergency Department (HOSPITAL_COMMUNITY)
Admission: EM | Admit: 2019-12-07 | Discharge: 2019-12-07 | Disposition: A | Discharge status: Home / Self Care | Payer: Medicare HMO | Attending: Emergency Medicine | Admitting: Emergency Medicine

## 2019-12-07 ENCOUNTER — Emergency Department (HOSPITAL_COMMUNITY): Payer: Medicare HMO

## 2019-12-07 ENCOUNTER — Telehealth: Payer: Self-pay | Admitting: Podiatry

## 2019-12-07 ENCOUNTER — Other Ambulatory Visit: Payer: Self-pay

## 2019-12-07 ENCOUNTER — Encounter (HOSPITAL_COMMUNITY): Payer: Self-pay

## 2019-12-07 DIAGNOSIS — Z20822 Contact with and (suspected) exposure to covid-19: Secondary | ICD-10-CM | POA: Insufficient documentation

## 2019-12-07 DIAGNOSIS — I10 Essential (primary) hypertension: Secondary | ICD-10-CM | POA: Insufficient documentation

## 2019-12-07 DIAGNOSIS — H409 Unspecified glaucoma: Secondary | ICD-10-CM | POA: Diagnosis not present

## 2019-12-07 DIAGNOSIS — Z7982 Long term (current) use of aspirin: Secondary | ICD-10-CM | POA: Insufficient documentation

## 2019-12-07 DIAGNOSIS — R0602 Shortness of breath: Secondary | ICD-10-CM | POA: Diagnosis not present

## 2019-12-07 DIAGNOSIS — I959 Hypotension, unspecified: Secondary | ICD-10-CM | POA: Diagnosis not present

## 2019-12-07 DIAGNOSIS — Z79891 Long term (current) use of opiate analgesic: Secondary | ICD-10-CM | POA: Diagnosis not present

## 2019-12-07 DIAGNOSIS — R079 Chest pain, unspecified: Secondary | ICD-10-CM | POA: Diagnosis not present

## 2019-12-07 DIAGNOSIS — I82611 Acute embolism and thrombosis of superficial veins of right upper extremity: Secondary | ICD-10-CM | POA: Diagnosis not present

## 2019-12-07 DIAGNOSIS — R7303 Prediabetes: Secondary | ICD-10-CM | POA: Diagnosis not present

## 2019-12-07 DIAGNOSIS — R0789 Other chest pain: Secondary | ICD-10-CM | POA: Diagnosis not present

## 2019-12-07 DIAGNOSIS — R072 Precordial pain: Secondary | ICD-10-CM | POA: Diagnosis not present

## 2019-12-07 DIAGNOSIS — M25562 Pain in left knee: Secondary | ICD-10-CM | POA: Diagnosis not present

## 2019-12-07 DIAGNOSIS — Z79899 Other long term (current) drug therapy: Secondary | ICD-10-CM | POA: Insufficient documentation

## 2019-12-07 DIAGNOSIS — G8929 Other chronic pain: Secondary | ICD-10-CM | POA: Diagnosis not present

## 2019-12-07 DIAGNOSIS — M25561 Pain in right knee: Secondary | ICD-10-CM | POA: Diagnosis not present

## 2019-12-07 DIAGNOSIS — T8141XD Infection following a procedure, superficial incisional surgical site, subsequent encounter: Secondary | ICD-10-CM | POA: Diagnosis not present

## 2019-12-07 LAB — CBC
HCT: 36.7 % (ref 36.0–46.0)
Hemoglobin: 12.1 g/dL (ref 12.0–15.0)
MCH: 30.3 pg (ref 26.0–34.0)
MCHC: 33 g/dL (ref 30.0–36.0)
MCV: 92 fL (ref 80.0–100.0)
Platelets: 246 10*3/uL (ref 150–400)
RBC: 3.99 MIL/uL (ref 3.87–5.11)
RDW: 13.2 % (ref 11.5–15.5)
WBC: 15.4 10*3/uL — ABNORMAL HIGH (ref 4.0–10.5)
nRBC: 0 % (ref 0.0–0.2)

## 2019-12-07 LAB — D-DIMER, QUANTITATIVE: D-Dimer, Quant: 1.01 ug/mL-FEU — ABNORMAL HIGH (ref 0.00–0.50)

## 2019-12-07 LAB — CBG MONITORING, ED: Glucose-Capillary: 86 mg/dL (ref 70–99)

## 2019-12-07 LAB — COMPREHENSIVE METABOLIC PANEL
ALT: 21 U/L (ref 0–44)
AST: 19 U/L (ref 15–41)
Albumin: 3.5 g/dL (ref 3.5–5.0)
Alkaline Phosphatase: 66 U/L (ref 38–126)
Anion gap: 10 (ref 5–15)
BUN: 14 mg/dL (ref 8–23)
CO2: 25 mmol/L (ref 22–32)
Calcium: 9.1 mg/dL (ref 8.9–10.3)
Chloride: 102 mmol/L (ref 98–111)
Creatinine, Ser: 0.81 mg/dL (ref 0.44–1.00)
GFR calc Af Amer: >60 mL/min (ref >60)
GFR calc non Af Amer: >60 mL/min (ref >60)
Glucose, Bld: 91 mg/dL (ref 70–99)
Potassium: 3.7 mmol/L (ref 3.5–5.1)
Sodium: 137 mmol/L (ref 135–145)
Total Bilirubin: 1.2 mg/dL (ref 0.3–1.2)
Total Protein: 7.4 g/dL (ref 6.5–8.1)

## 2019-12-07 LAB — TROPONIN I (HIGH SENSITIVITY)
Troponin I (High Sensitivity): 4 ng/L (ref <18)
Troponin I (High Sensitivity): 4 ng/L (ref <18)

## 2019-12-07 LAB — SARS CORONAVIRUS 2 BY RT PCR (HOSPITAL ORDER, PERFORMED IN ~~LOC~~ HOSPITAL LAB): SARS Coronavirus 2: NEGATIVE

## 2019-12-07 MED ORDER — SODIUM CHLORIDE (PF) 0.9 % IJ SOLN
INTRAMUSCULAR | Status: AC
  Filled 2019-12-07: qty 100

## 2019-12-07 MED ORDER — IOHEXOL 350 MG/ML SOLN
75.0000 mL | Freq: Once | INTRAVENOUS | Status: AC | PRN
  Administered 2019-12-07: 75 mL via INTRAVENOUS

## 2019-12-07 MED ORDER — ACETAMINOPHEN 500 MG PO TABS
1000.0000 mg | ORAL_TABLET | Freq: Once | ORAL | Status: AC
  Administered 2019-12-07: 1000 mg via ORAL
  Filled 2019-12-07: qty 2

## 2019-12-07 NOTE — ED Triage Notes (Addendum)
Pt arrives [facility] from home with complaints of sudden onset of substernal chest pain and SHOB. Pt is 2 days post OP from left foot surgery. Pt has hx of DVT. No known blood thinners. EMS administered 324 ASA

## 2019-12-07 NOTE — ED Provider Notes (Signed)
[facility] DEPT Provider Note   CSN: [id number] Arrival date & time: 12/07/19  1149     History Chief Complaint  Patient presents with  . Chest Pain  . Shortness of Breath    Alexis Ball is a 64 y.o. female.  HPI  Patient is 64 year old female with a history of hypertension, obesity, osteomyelitis of left foot, recent surgery on left foot 2 days ago, superficial venous thrombosis of the right arm associated with PICC line that was recently removed within the past week, questionable history of DVT.  Covid vaccination x2.   Patient presents to ER from EMS with chief complaint of abrupt onset of sternal chest pain that seems to have moved to her left-sided chest just underneath her breast.  She describes the pain as sharp but also describes it as achy and pressure-like.  She states that it began abruptly this morning and seem to have worsened over period of approximately 30 minutes.  She states that it was at his worst 8/10 she states it is waned somewhat to 3/10 currently.  Is more achy in character now.  No associated nausea or vomiting.  Some associated shortness of breath which has continued to be persistent. No hemoptysis.  She has a history of upper extremity venous thrombosis associated with a PICC line that was recently removed.  Daughter at bedside confirms no history of lower extremity DVT.  She has had left foot fracture that was repaired 2 days ago.  She does have a history of osteomyelitis in that same foot.  She denies any fevers at home she did check her temperature and states it was approximately 99.  No cough or congestion no lightheadedness or dizziness.     Past Medical History:  Diagnosis Date  . Full dentures   . GERD (gastroesophageal reflux disease)    occasional , takes tums  . Glaucoma, both eyes   . History of cellulitis    2006  LLE  . History of chest pain    had cardiac cath 10-16-2004 (in epic)  showed normal  coronaries that are tortous, patent renal arteries, normal LVF with ef 50-55%, noncardiac chest pain  . History of CVA (cerebrovascular accident) without residual deficits    11-29-2019  per pt happened in 2007 and has not had cva/ tia sympomts since  . Hypertension    followed by pcp  . Left foot infection 10/2019   s/p  10-05-2019 left bunionectomy;  hospital admission 10-14-2019 acute osteomyelitis/ wound dehiscence  s/p  I&D twice , abx device , and external fixator  . Migraines    11-29-2019 per pt previously had botox injection for prevention, last had 3 yrs ago,  now uses breathing , meditation, and minimizes triggers (bright light/ loud noise)  . Neuropathy    feet  . OA (osteoarthritis)   . Rash    11-29-2019  per pt had PICC line removed yesterday , has rash and bruising in this area of right arm    Patient Active Problem List   Diagnosis Date Noted  . PICC (peripherally inserted central catheter) in place 11/23/2019  . Medication monitoring encounter 11/23/2019  . Osteomyelitis of ankle or foot, acute, right ([place]) 11/22/2019  . Superficial venous thrombosis of arm, right 11/02/2019  . Abscess of bursa of left foot 10/14/2019  . Foot abscess, left 10/14/2019  . Wound infection   . Unilateral primary osteoarthritis, left knee 04/27/2019  . Chondromalacia patellae, left knee 07/02/2016  .  Arthritis of left knee 02/25/2016  . Vaginitis and vulvovaginitis, unspecified 08/08/2013  . Leiomyoma of uterus, unspecified 08/08/2013  . Atypical chest pain 03/13/2013  . Abnormal ECG 03/13/2013  . Obesity, unspecified 03/13/2013  . Hypertension     Past Surgical History:  Procedure Laterality Date  . BONE BIOPSY Left 10/14/2019   Procedure: BONE BIOPSY PROXIMAL PHALANX;  Surgeon: Evelina Bucy, DPM;  Location: [facility];  Service: Podiatry;  Laterality: Left;  . BUNIONECTOMY Left 10-05-2019  dr Milinda Pointer _0    great toe  . CARDIAC CATHETERIZATION  10-16-2004  dr Einar Gip   normal  coronaries (tortuous), normal LVF  . CATARACT EXTRACTION W/ INTRAOCULAR LENS  IMPLANT, BILATERAL  2013  . COLONOSCOPY  last one 04-23-2009  dr d. Sharlett Iles  . FOOT SURGERY Right unsure date   bunionectomy and hammertoe  . HARDWARE REMOVAL Right 06/ 2015  _1    right foot s/p bunionectomy/ hammertoe conrrection  . HARDWARE REMOVAL Left 12/05/2019   Procedure: HARDWARE REMOVAL ,WOUND CLOSURE OF FIRST TOE;  Surgeon: Evelina Bucy, DPM;  Location: [facility];  Service: Podiatry;  Laterality: Left;  . IRRIGATION AND DEBRIDEMENT FOOT Left 10/14/2019   Procedure: INCISION AND DRAINAGE FOR LEFT FOOT INFECTION, COMPLEX;  Surgeon: Evelina Bucy, DPM;  Location: [facility];  Service: Podiatry;  Laterality: Left;  . IRRIGATION AND DEBRIDEMENT FOOT Left 10/17/2019   Procedure: Debridement and Irrigation of Left Foot; Removal of Silicone Implant; Insertion of Antibiotic Spacer; Application of External Fixator; Possible Application of Wound VAC;  Surgeon: Evelina Bucy, DPM;  Location: [facility];  Service: Podiatry;  Laterality: Left;  Pre-op Block POP/SAPH; MAC  . KNEE ARTHROSCOPY Left 2018  . ORIF CALCANEOUS FRACTURE Left 10/17/2019   Procedure: Application External Fixation;  Surgeon: Evelina Bucy, DPM;  Location: [facility];  Service: Podiatry;  Laterality: Left;     OB History    Gravida  3   Para  3   Term  3   Preterm      AB      Living  3     SAB      TAB      Ectopic      Multiple      Live Births  3           Family History  Problem Relation Age of Onset  . Hypertension Mother     Social History   Tobacco Use  . Smoking status: Never Smoker  . Smokeless tobacco: Never Used  Vaping Use  . Vaping Use: Never used  Substance Use Topics  . Alcohol use: No  . Drug use: No    Home Medications Prior to Admission medications   Medication Sig Start Date End Date Taking? Authorizing Provider  aspirin EC 81 MG tablet Take 81 mg by mouth daily.    [provider]  bimatoprost (LUMIGAN) 0.01 % SOLN Place 1 drop into both eyes at bedtime.     [provider]  brimonidine (ALPHAGAN) 0.15 % ophthalmic solution Place 1 drop into both eyes 2 (two) times daily.     [provider]  calcium carbonate (TUMS - DOSED IN MG ELEMENTAL CALCIUM) 500 MG chewable tablet Chew 1 tablet by mouth as needed for indigestion or heartburn.    [provider]  cephALEXin (KEFLEX) 500 MG capsule Take 1 capsule (500 mg total) by mouth 2 (two) times daily. 12/05/19   Evelina Bucy, DPM  dorzolamide-timolol (COSOPT)  22.3-6.8 MG/ML ophthalmic solution Place 1 drop into both eyes 2 (two) times daily.  05/14/13   [provider]  ertapenem (INVANZ) IVPB Inject 1 g into the vein daily. Indication:  Osteomyelitis of left foot First Dose: Yes Last Day of Therapy:  11/28/19 Labs - Once weekly:  CBC/D and BMP, Labs - Every other week:  ESR and CRP Method of administration: Mini-Bag Plus / Gravity Method of administration may be changed at the discretion of home infusion pharmacist based upon assessment of the patient and/or caregiver's ability to self-administer the medication ordered. Patient not taking: Reported on 11/29/2019 11/08/19 12/19/19  Shelly Coss, MD  ferrous sulfate 325 (65 FE) MG tablet Take 325 mg by mouth daily with breakfast.    [provider]  gabapentin (NEURONTIN) 300 MG capsule Take 300-600 mg by mouth See admin instructions. Take one capsule (395m) in the morning and 2 capsules (6068m at bedtime. 03/25/18   [provider]  hydrochlorothiazide (HYDRODIURIL) 25 MG tablet Take 25 mg by mouth daily.    [provider]  ibuprofen (ADVIL,MOTRIN) 200 MG tablet Take 600-800 mg by mouth every 6 (six) hours as needed for headache (pain).    [provider]  lidocaine (XYLOCAINE) 5 % ointment Apply 1 application topically 2 (two) times daily as needed for mild pain.     [provider]    Multiple Vitamin (MULTIVITAMIN) capsule Take 1 capsule by mouth daily.    [provider]  Omega-3 Fatty Acids (FISH OIL) 1000 MG CAPS Take 1,000 mg by mouth daily.    [provider]  ondansetron (ZOFRAN) 4 MG tablet Take 1 tablet (4 mg total) by mouth every 8 (eight) hours as needed. 10/03/19   Hyatt, Max T, DPM  Oxycodone HCl 10 MG TABS Take 1 tablet (10 mg total) by mouth 3 (three) times daily as needed. 11/27/19   PrEvelina BucyDPM  oxyCODONE-acetaminophen (PERCOCET) 10-325 MG tablet Take 1 tablet by mouth every 4 (four) hours as needed for pain. 12/05/19   PrEvelina BucyDPM  polyethylene glycol (MIRALAX) 17 g packet Take 17 g by mouth daily as needed for moderate constipation or severe constipation. 03/02/19   MaJaynee EaglesPA-C  Polyvinyl Alcohol-Povidone (REFRESH OP) Place 1 drop into both eyes 3 (three) times daily as needed (dry eyes).     [provider]  potassium chloride SA (K-DUR,KLOR-CON) 20 MEQ tablet Take 20 mEq by mouth daily.    [provider]  triamcinolone cream (KENALOG) 0.1 % Apply 1 application topically 2 (two) times daily. Patient taking differently: Apply 1 application topically 2 (two) times daily as needed.  02/01/19   NeRaylene EvertsMD    Allergies    Iodine  Review of Systems   Review of Systems  Constitutional: Positive for fatigue. Negative for chills and fever.  HENT: Negative for congestion.   Eyes: Negative for pain.  Respiratory: Positive for shortness of breath. Negative for cough.   Cardiovascular: Positive for chest pain. Negative for leg swelling.  Gastrointestinal: Negative for abdominal distention, abdominal pain, diarrhea, nausea and vomiting.  Genitourinary: Negative for dysuria.  Musculoskeletal: Negative for myalgias.  Skin: Negative for rash.  Neurological: Positive for light-headedness. Negative for dizziness and headaches.    Physical Exam Updated Vital Signs BP 139/79   Pulse 77   Temp  98.4 F (36.9 C) (Oral)   Resp 18   Ht 5' 2" (1.575 m)   Wt 89.8 kg  SpO2 96%   BMI 36.21 kg/m   Physical Exam Vitals and nursing note reviewed.  Constitutional:      General: She is not in acute distress. HENT:     Head: Normocephalic and atraumatic.     Nose: Nose normal.     Mouth/Throat:     Mouth: Mucous membranes are moist.  Eyes:     General: No scleral icterus.    Extraocular Movements: Extraocular movements intact.     Pupils: Pupils are equal, round, and reactive to light.  Cardiovascular:     Rate and Rhythm: Normal rate and regular rhythm.     Pulses: Normal pulses.     Heart sounds: Normal heart sounds.  Pulmonary:     Effort: Pulmonary effort is normal. No respiratory distress.     Breath sounds: Normal breath sounds. No wheezing.     Comments: Lungs are clear in all fields.  No increased work of breathing; mildly tachypneic with respiratory rate of 24. Abdominal:     Palpations: Abdomen is soft.     Tenderness: There is no abdominal tenderness. There is no right CVA tenderness, left CVA tenderness, guarding or rebound.  Musculoskeletal:     Cervical back: Normal range of motion.     Right lower leg: No edema.     Left lower leg: No edema.     Comments: See picture of left foot.  There are surgical wounds are well healing with staples and sutures in place.  No purulence, erythema, redness or warmth is detected. Full range of motion of the lower extremities joints.  No calf tenderness.  Negative Homan sign. Mild left-sided calf swelling.  No pitting edema.  Skin:    General: Skin is warm and dry.     Capillary Refill: Capillary refill takes less than 2 seconds.  Neurological:     Mental Status: She is alert. Mental status is at baseline.  Psychiatric:        Mood and Affect: Mood normal.        Behavior: Behavior normal.     ED Results / Procedures / Treatments   Labs (all labs ordered are listed, but only abnormal results are displayed) Labs  Reviewed  CBC - Abnormal; Notable for the following components:      Result Value   WBC 15.4 (*)    All other components within normal limits  D-DIMER, QUANTITATIVE (NOT AT [facility]) - Abnormal; Notable for the following components:   D-Dimer, Quant 1.01 (*)    All other components within normal limits  SARS CORONAVIRUS 2 BY RT PCR (HOSPITAL ORDER, [facility])  COMPREHENSIVE METABOLIC PANEL  CBG MONITORING, ED  TROPONIN I (HIGH SENSITIVITY)  TROPONIN I (HIGH SENSITIVITY)    EKG EKG Interpretation  Date/Time:  Friday December 07 2019 12:05:40 EDT Ventricular Rate:  82 PR Interval:    QRS Duration: 86 QT Interval:  353 QTC Calculation: 413 R Axis:   42 Text Interpretation: Sinus rhythm Borderline repolarization abnormality Confirmed by Dene Gentry [phone]) on 12/07/2019 1:56:30 PM   Radiology CT Angio Chest PE W/Cm &/Or Wo Cm  Result Date: 12/07/2019 CLINICAL DATA:  64 year old female with sudden onset chest pain and shortness of breath. Recent lower extremity surgery. EXAM: CT ANGIOGRAPHY CHEST WITH CONTRAST TECHNIQUE: Multidetector CT imaging of the chest was performed using the standard protocol during bolus administration of intravenous contrast. Multiplanar CT image reconstructions and MIPs were obtained to evaluate the vascular anatomy. CONTRAST:  32m  OMNIPAQUE IOHEXOL 350 MG/ML SOLN COMPARISON:  Portable chest earlier today. FINDINGS: Cardiovascular: Good contrast bolus timing in the pulmonary arterial tree. Intermittent respiratory motion. Those pulmonary artery branches not degraded by motion appear patent and normally enhancing. Distal upper lobe and subsegmental lower lobe branches are degraded by motion. No cardiomegaly or pericardial effusion. Little contrast in the aorta. Mediastinum/Nodes: Negative.  No lymphadenopathy. Lungs/Pleura: Major airways are patent. Mild respiratory motion. Otherwise both lungs appear clear. No pleural effusion. Upper  Abdomen: Negative visible liver, spleen, pancreas, adrenal glands, left kidney and bowel in the upper abdomen. Musculoskeletal: Widespread advanced thoracic disc and endplate degeneration. Multilevel vacuum disc. No acute osseous abnormality identified. Review of the MIP images confirms the above findings. IMPRESSION: 1. Respiratory motion degrades the distal upper lobe and the subsegmental lower lobe branches. But otherwise there is no acute pulmonary embolus. 2. No other acute findings in the chest. Advanced thoracic spine degeneration. Electronically Signed   By: Genevie Ann M.D.   On: 12/07/2019 14:53   DG Chest Portable 1 View  Result Date: 12/07/2019 CLINICAL DATA:  64 year old female with chest pain and shortness of breath. Recent left foot surgery. EXAM: PORTABLE CHEST 1 VIEW COMPARISON:  Chest radiographs 03/07/2014 and earlier. FINDINGS: Portable AP semi upright view at 1340 hours. Lordotic positioning. Mediastinal contours remain normal. Visualized tracheal air column is within normal limits. Mild chronic increased interstitial markings in both lungs. Otherwise when allowing for portable technique the lungs are clear. No pneumothorax. No acute osseous abnormality identified. IMPRESSION: No acute cardiopulmonary abnormality. Electronically Signed   By: Genevie Ann M.D.   On: 12/07/2019 14:16    Procedures Procedures (including critical care time)  Medications Ordered in ED Medications  sodium chloride (PF) 0.9 % injection (has no administration in time range)  acetaminophen (TYLENOL) tablet 1,000 mg (1,000 mg Oral Given 12/07/19 1325)  iohexol (OMNIPAQUE) 350 MG/ML injection 75 mL (75 mLs Intravenous Contrast Given 12/07/19 1430)    ED Course  I have reviewed the triage vital signs and the nursing notes.  Pertinent labs & imaging results that were available during my care of the patient were reviewed by me and considered in my medical decision making (see chart for details).   Patient is  64 year old female past medical history detailed above presented today for chest pain and shortness of breath.  Patient physical exam is relatively reassuring although she is mildly tachypneic she does have risk factors for VTE given recent surgery and history of VTE-associated with PICC line. She is on blood thinners.  Low suspicion for ACS given history.  The emergent causes of chest pain include: Acute coronary syndrome, tamponade, pericarditis/myocarditis, aortic dissection, pulmonary embolism, tension pneumothorax, pneumonia, and esophageal rupture.  Given patient's history and physical exam I do have concern for pulmonary embolisms and will obtain dimer.  I do not believe the patient has a different emergent cause of chest pain, other urgent/non-acute considerations include, but are not limited to: chronic angina, aortic stenosis, cardiomyopathy, mitral valve prolapse, pulmonary hypertension, aortic insufficiency, right ventricular hypertrophy, pleuritis, bronchitis, pneumothorax, tumor, gastroesophageal reflux disease (GERD), esophageal spasm, Mallory-Weiss syndrome, peptic ulcer disease, pancreatitis, functional gastrointestinal pain, cervical or thoracic disk disease or arthritis, shoulder arthritis, costochondritis, subacromial bursitis, anxiety or panic attack, herpes zoster, breast disorders, chest wall tumors, thoracic outlet syndrome, mediastinitis.  Clinical Course as of Dec 07 1554  Fri Dec 07, 2019  1543 CBC very mild leukocytosis could be secondary to recent surgery.  CMP without electrolyte abnormality.  Covid swab negative.  Dimer elevated at 1.01 CT scan was ordered and results are to be negative for acute pulmonary embolism.  There was some motion degredation however no evidence of PE.   [WF]  1544 CXR NAD agree with radiologist. I personally reviewed CXR.    [WF]  5956 EKG with no acute abnormality.  Normal sinus rhythm with no significant axis deviation no ST-T wave  abnormalities.   [WF]  3875 Second troponin pending at this time.  Given patient's negative CTPA and reassuring work-up plan to discharge if second troponin is negative.  Patient updated to plan.  She is eating.   [WF]    Clinical Course User Index [WF] Tedd Sias, [place]   MDM Rules/Calculators/A&P                          I discussed this case with my attending physician who cosigned this note including patient's presenting symptoms, physical exam, and planned diagnostics and interventions. Attending physician stated agreement with plan or made changes to plan which were implemented.   Attending physician assessed patient at bedside.  Patient and daughter informed of work-up.  Overall reassuring work-up.  Patient discharged with PCP follow-up and cardiology information.  She is tolerating p.o. and is now sleeping comfortably in bed.  She states no significant chest pain currently.  States that she feels better after the Tylenol.  Respiratory rate now within normal limits.  Final Clinical Impression(s) / ED Diagnoses Final diagnoses:  Chest pain, unspecified type  Shortness of breath    Rx / DC Orders ED Discharge Orders    None       Tedd Sias, [place] 12/07/19 1706    Valarie Merino, MD 12/07/19 2251

## 2019-12-07 NOTE — ED Notes (Signed)
Pt transported to CT at this time.

## 2019-12-07 NOTE — ED Notes (Signed)
Pt given sandwich and water at this time

## 2019-12-07 NOTE — ED Notes (Signed)
Pt placed on Purewick at 50 mmHg

## 2019-12-07 NOTE — ED Notes (Signed)
Attempted to obtain second IV times 2- Unsuccessful.

## 2019-12-07 NOTE — Discharge Instructions (Addendum)
Your cardiac enzymes, EKG, chest x-ray, chest CT scan and blood work was reassuring today. Please follow-up with your primary care doctor within the week. They may at their discretion refer you to a cardiologist. I have also given you the information for the cardiology group here which you may call in the complaining with.  You may use 1000 mg of Tylenol every 6 hours for pain.

## 2019-12-07 NOTE — Telephone Encounter (Signed)
Pts dghtr called to inform Dr March Rummage that the patient is going back to the hospital. States patient has fever, shaking, high blood pressure and chest pain that they are relating to her foot surgery. Pts home health nurse came to see the patient and sent her to the hospital.

## 2019-12-09 DIAGNOSIS — M869 Osteomyelitis, unspecified: Secondary | ICD-10-CM

## 2019-12-09 DIAGNOSIS — T8130XA Disruption of wound, unspecified, initial encounter: Secondary | ICD-10-CM

## 2019-12-09 NOTE — Telephone Encounter (Signed)
Attempted to call patient to check on her. No answer. VM left.  ER records were reviewed.

## 2019-12-11 ENCOUNTER — Ambulatory Visit (INDEPENDENT_AMBULATORY_CARE_PROVIDER_SITE_OTHER): Payer: Medicare HMO | Admitting: Podiatry

## 2019-12-11 ENCOUNTER — Other Ambulatory Visit: Payer: Self-pay

## 2019-12-11 VITALS — Temp 98.6°F

## 2019-12-11 DIAGNOSIS — M25562 Pain in left knee: Secondary | ICD-10-CM | POA: Diagnosis not present

## 2019-12-11 DIAGNOSIS — H409 Unspecified glaucoma: Secondary | ICD-10-CM | POA: Diagnosis not present

## 2019-12-11 DIAGNOSIS — G8929 Other chronic pain: Secondary | ICD-10-CM | POA: Diagnosis not present

## 2019-12-11 DIAGNOSIS — Z9889 Other specified postprocedural states: Secondary | ICD-10-CM

## 2019-12-11 DIAGNOSIS — I82611 Acute embolism and thrombosis of superficial veins of right upper extremity: Secondary | ICD-10-CM | POA: Diagnosis not present

## 2019-12-11 DIAGNOSIS — Z79891 Long term (current) use of opiate analgesic: Secondary | ICD-10-CM | POA: Diagnosis not present

## 2019-12-11 DIAGNOSIS — I10 Essential (primary) hypertension: Secondary | ICD-10-CM | POA: Diagnosis not present

## 2019-12-11 DIAGNOSIS — T8141XD Infection following a procedure, superficial incisional surgical site, subsequent encounter: Secondary | ICD-10-CM | POA: Diagnosis not present

## 2019-12-11 DIAGNOSIS — Z01818 Encounter for other preprocedural examination: Secondary | ICD-10-CM

## 2019-12-11 DIAGNOSIS — R0789 Other chest pain: Secondary | ICD-10-CM

## 2019-12-11 DIAGNOSIS — I82402 Acute embolism and thrombosis of unspecified deep veins of left lower extremity: Secondary | ICD-10-CM

## 2019-12-11 DIAGNOSIS — Z7982 Long term (current) use of aspirin: Secondary | ICD-10-CM | POA: Diagnosis not present

## 2019-12-11 DIAGNOSIS — R7303 Prediabetes: Secondary | ICD-10-CM | POA: Diagnosis not present

## 2019-12-11 DIAGNOSIS — T8484XA Pain due to internal orthopedic prosthetic devices, implants and grafts, initial encounter: Secondary | ICD-10-CM

## 2019-12-11 DIAGNOSIS — M25561 Pain in right knee: Secondary | ICD-10-CM | POA: Diagnosis not present

## 2019-12-11 MED ORDER — OXYCODONE-ACETAMINOPHEN 5-325 MG PO TABS: 1.0000 | ORAL_TABLET | ORAL | 0 refills | Status: DC | PRN

## 2019-12-11 MED ORDER — OXYCODONE-ACETAMINOPHEN 10-325 MG PO TABS
1.0000 | ORAL_TABLET | ORAL | 0 refills | Status: DC | PRN
Start: 2019-12-11 — End: 2019-12-11

## 2019-12-11 MED ORDER — RIVAROXABAN 10 MG PO TABS: 10.0000 mg | ORAL_TABLET | Freq: Every day | ORAL | 0 refills | Status: DC

## 2019-12-11 NOTE — Progress Notes (Signed)
  Subjective:  Patient ID: Alexis Ball, female    DOB: 1955/05/16,  MRN: [id number]  Chief Complaint  Patient presents with  . Routine Post Op    POV#1 DOS 08.25.2021 REMOVAL OF ANTIBIOTIC SPACER W/ POSS EXCHANGE LT; BONE BIOPSY LT; REMOVAL OF EXTERNAL FIXATOR. Pt states "I have had nausea/vomiting and feel warm". Pt states soreness in foot. No other concerns.    DOS: 12/05/19 Procedure: Removal of external fixator left with wound closure  64 y.o. female presents with the above complaint. History confirmed with patient. States she is still having a lot of nausea and vomiting, no CP. Negative for Covid 12/07/19  Objective:  Physical Exam: tenderness at the surgical site, local edema noted and calf supple, nontender. Incision: healing well, only small granular wound area dorsally  Assessment:   1. Pre-op testing   2. Painful orthopaedic hardware ([place])   3. Post-operative state   4. Atypical chest pain   5. Deep vein thrombosis (DVT) of left lower extremity, unspecified chronicity, unspecified vein (HCC)      Plan:  Patient was evaluated and treated and all questions answered.  Post-operative State -With previous DVT and concerning symptoms despite negative PE will start Xarelto. Advised of the risks of bleeding with falls -Dressed with DSD today -HHC to apply aquacell to the wound thrice weekly -Continue home PT -Order venous US to eval for DVT -Referral placed to cardiology as ED follow-up. I did not see this referral in system. -Refill pain rx  Return in about 1 week (around 12/18/2019) for Post-Op (No XRs).

## 2019-12-12 ENCOUNTER — Telehealth: Payer: Self-pay | Admitting: Podiatry

## 2019-12-12 ENCOUNTER — Telehealth (INDEPENDENT_AMBULATORY_CARE_PROVIDER_SITE_OTHER): Payer: Medicare HMO | Admitting: Podiatry

## 2019-12-12 DIAGNOSIS — I82611 Acute embolism and thrombosis of superficial veins of right upper extremity: Secondary | ICD-10-CM | POA: Diagnosis not present

## 2019-12-12 DIAGNOSIS — R7303 Prediabetes: Secondary | ICD-10-CM | POA: Diagnosis not present

## 2019-12-12 DIAGNOSIS — Z7982 Long term (current) use of aspirin: Secondary | ICD-10-CM | POA: Diagnosis not present

## 2019-12-12 DIAGNOSIS — Z79891 Long term (current) use of opiate analgesic: Secondary | ICD-10-CM | POA: Diagnosis not present

## 2019-12-12 DIAGNOSIS — M25561 Pain in right knee: Secondary | ICD-10-CM | POA: Diagnosis not present

## 2019-12-12 DIAGNOSIS — M25562 Pain in left knee: Secondary | ICD-10-CM | POA: Diagnosis not present

## 2019-12-12 DIAGNOSIS — H409 Unspecified glaucoma: Secondary | ICD-10-CM | POA: Diagnosis not present

## 2019-12-12 DIAGNOSIS — T8141XD Infection following a procedure, superficial incisional surgical site, subsequent encounter: Secondary | ICD-10-CM | POA: Diagnosis not present

## 2019-12-12 DIAGNOSIS — I10 Essential (primary) hypertension: Secondary | ICD-10-CM | POA: Diagnosis not present

## 2019-12-12 DIAGNOSIS — G8929 Other chronic pain: Secondary | ICD-10-CM | POA: Diagnosis not present

## 2019-12-12 NOTE — Telephone Encounter (Signed)
Pt daughter in law called and stated that her oxycodone is too high of a dosage for her insurance to pay for. She will need a new prescription.

## 2019-12-12 NOTE — Telephone Encounter (Signed)
Nurse Leda Gauze called from [facility] requesting wound care orders for Alexis Ball. Fax number [phone].

## 2019-12-12 NOTE — Telephone Encounter (Signed)
Called pharmacy and fixed Rx Called patient to inform.

## 2019-12-13 ENCOUNTER — Other Ambulatory Visit: Payer: Self-pay

## 2019-12-13 ENCOUNTER — Ambulatory Visit (HOSPITAL_COMMUNITY)
Admission: RE | Admit: 2019-12-13 | Discharge: 2019-12-13 | Disposition: A | Discharge status: Home / Self Care | Payer: Medicare HMO | Source: Ambulatory Visit | Attending: Podiatry | Admitting: Podiatry

## 2019-12-13 ENCOUNTER — Other Ambulatory Visit: Payer: Self-pay | Admitting: Podiatry

## 2019-12-13 ENCOUNTER — Telehealth (INDEPENDENT_AMBULATORY_CARE_PROVIDER_SITE_OTHER): Payer: Medicare HMO | Admitting: Podiatry

## 2019-12-13 DIAGNOSIS — I82402 Acute embolism and thrombosis of unspecified deep veins of left lower extremity: Secondary | ICD-10-CM | POA: Insufficient documentation

## 2019-12-13 DIAGNOSIS — T8131XD Disruption of external operation (surgical) wound, not elsewhere classified, subsequent encounter: Secondary | ICD-10-CM

## 2019-12-13 NOTE — Telephone Encounter (Signed)
Rodman Key from [facility] called to give prelimary results of venus doppler for Alexis Ball's left lower extremity.

## 2019-12-13 NOTE — Telephone Encounter (Signed)
Can we send orders? I placed the order

## 2019-12-13 NOTE — Telephone Encounter (Signed)
Noted  

## 2019-12-13 NOTE — Progress Notes (Signed)
[place] orders placed

## 2019-12-18 ENCOUNTER — Encounter: Payer: Medicare HMO | Admitting: Podiatry

## 2019-12-18 DIAGNOSIS — H04123 Dry eye syndrome of bilateral lacrimal glands: Secondary | ICD-10-CM | POA: Diagnosis not present

## 2019-12-18 DIAGNOSIS — Z961 Presence of intraocular lens: Secondary | ICD-10-CM | POA: Diagnosis not present

## 2019-12-18 DIAGNOSIS — H401133 Primary open-angle glaucoma, bilateral, severe stage: Secondary | ICD-10-CM | POA: Diagnosis not present

## 2019-12-19 NOTE — Telephone Encounter (Signed)
I have faxed over all the information to [facility].

## 2019-12-25 ENCOUNTER — Other Ambulatory Visit: Payer: Self-pay

## 2019-12-25 ENCOUNTER — Ambulatory Visit (INDEPENDENT_AMBULATORY_CARE_PROVIDER_SITE_OTHER): Payer: Medicare HMO | Admitting: Podiatry

## 2019-12-25 DIAGNOSIS — T8131XD Disruption of external operation (surgical) wound, not elsewhere classified, subsequent encounter: Secondary | ICD-10-CM

## 2019-12-25 MED ORDER — OXYCODONE-ACETAMINOPHEN 5-325 MG PO TABS
1.0000 | ORAL_TABLET | ORAL | 0 refills | Status: DC | PRN
Start: 2019-12-25 — End: 2020-01-01

## 2019-12-25 MED ORDER — OXYCODONE HCL 10 MG PO TABS
10.0000 mg | ORAL_TABLET | Freq: Three times a day (TID) | ORAL | 0 refills | Status: DC | PRN
Start: 2019-12-25 — End: 2020-01-07

## 2019-12-25 MED ORDER — SILVER SULFADIAZINE 1 % EX CREA: TOPICAL_CREAM | CUTANEOUS | 0 refills | Status: DC

## 2019-12-25 NOTE — Progress Notes (Signed)
  Subjective:  Patient ID: Alexis Ball, female    DOB: December 20, 1955,  MRN: [id number]  Chief Complaint  Patient presents with  . Routine Post Op    POV#2 -pt states she is hurting;' 10/10 pain   DOS: 12/05/19 Procedure: Removal of external fixator left with wound closure  64 y.o. female presents with the above complaint. History confirmed with patient. Requests pain med refill. Not having much pain in the foot but feels it is stiff. Having pain in other joints. Taking the Xarelto and states that it is   Objective:  Physical Exam: tenderness at the surgical site, local edema noted and calf supple, nontender. Incision: healing well, intact suture/staples with overlying scab  Assessment:   1. Postoperative wound dehiscence, subsequent encounter      Plan:  Patient was evaluated and treated and all questions answered.  Post-operative State -Sutures and staples removed -Dressed with silvadene and DSD -Refill Oxycodone -Restart PT/OT -HHC nursing for silvadene dressing twice weekly  Return in about 2 weeks (around 01/08/2020).

## 2020-01-01 ENCOUNTER — Encounter: Payer: Self-pay | Admitting: Podiatry

## 2020-01-01 ENCOUNTER — Other Ambulatory Visit: Payer: Self-pay

## 2020-01-01 ENCOUNTER — Ambulatory Visit (INDEPENDENT_AMBULATORY_CARE_PROVIDER_SITE_OTHER): Payer: Medicare HMO | Admitting: Podiatry

## 2020-01-01 DIAGNOSIS — T8484XA Pain due to internal orthopedic prosthetic devices, implants and grafts, initial encounter: Secondary | ICD-10-CM

## 2020-01-01 DIAGNOSIS — T8131XD Disruption of external operation (surgical) wound, not elsewhere classified, subsequent encounter: Secondary | ICD-10-CM

## 2020-01-01 MED ORDER — HYDROCODONE-ACETAMINOPHEN 5-325 MG PO TABS
1.0000 | ORAL_TABLET | Freq: Four times a day (QID) | ORAL | 0 refills | Status: DC | PRN
Start: 2020-01-01 — End: 2020-01-22

## 2020-01-01 MED ORDER — ONDANSETRON HCL 4 MG PO TABS
4.0000 mg | ORAL_TABLET | Freq: Three times a day (TID) | ORAL | 0 refills | Status: DC | PRN
Start: 2020-01-01 — End: 2020-02-26

## 2020-01-01 NOTE — Progress Notes (Signed)
  Subjective:  Patient ID: Alexis Ball, female    DOB: Feb 24, 1956,  MRN: [id number]  No chief complaint on file.  DOS: 12/05/19 Procedure: Removal of external fixator left with wound closure  64 y.o. female presents with the above complaint. History confirmed with patient. States she is having some sharp tingling pain and itching. Endorses some swelling. Describes medication related nausea.  Objective:  Physical Exam: tenderness at the surgical site, local edema noted and calf supple, nontender. Stiffness noted of the 1st MPJ. Incision: healed. No open wounds.  Assessment:   1. Postoperative wound dehiscence, subsequent encounter   2. Painful orthopaedic hardware [facility])      Plan:  Patient was evaluated and treated and all questions answered.  Post-operative State -No need for further dressing of foor -Refill pain rx switch to Norco -Refill Zofran. -Continue PT/OT. Work on ROM. -Dispense surgical shoe  Return in about 3 weeks (around 01/22/2020) for Post-Op (with XRs).

## 2020-01-04 ENCOUNTER — Encounter: Payer: Self-pay | Admitting: Podiatry

## 2020-01-04 ENCOUNTER — Ambulatory Visit (INDEPENDENT_AMBULATORY_CARE_PROVIDER_SITE_OTHER): Payer: Medicare HMO | Admitting: Podiatry

## 2020-01-04 ENCOUNTER — Other Ambulatory Visit: Payer: Self-pay

## 2020-01-04 DIAGNOSIS — E1169 Type 2 diabetes mellitus with other specified complication: Secondary | ICD-10-CM

## 2020-01-04 DIAGNOSIS — B351 Tinea unguium: Secondary | ICD-10-CM

## 2020-01-04 DIAGNOSIS — E1151 Type 2 diabetes mellitus with diabetic peripheral angiopathy without gangrene: Secondary | ICD-10-CM | POA: Diagnosis not present

## 2020-01-04 NOTE — Progress Notes (Signed)
Cardiology Office Note:    Date:  01/07/2020   ID:  Alexis Ball, DOB 06-Jun-1955, MRN [id number]  PCP:  Lucianne Lei, MD  [facility] Cardiologist:  Freada Bergeron, MD  [facility] Electrophysiologist:  None   Referring MD: Evelina Bucy, DPM    History of Present Illness:    Alexis Ball is a 65 y.o. female with a hx of HTN, CVA in 2007, PICC associated DVT, and GERD who was referred by Dr. March Rummage for concern for chest pain.  ED note dated 12/07/19 reviewed. Patient presented to ED with abrupt onset substernal chest pain under the left breast. The pain was described as sharp but also achy and pressure-like. Had some associated SOB but no nausea, vomiting, diaphoresis, lightheadedness or syncope. Top 4, CTA without PE, ECG without ischemic changes. Discharged home with plans for cards follow-up.  Today patient states that she has occasional chest squeezing on the right side of the chest. Occurs randomly and is non-exertional. Pain improves with moving the right arm around. Pain lasts 10 seconds and then abates on it's own. Has occasional palpitations and some SOB. Has occasional lightheadedness. Also notes some nausea and retching which she attributes to GERD.  Of note, patient last saw Dr. Marlou Porch in clinic in 2014 where she presented with recurrent right sided chest pain. ECG showed NSR with diffuse T-wave inversions that were consistent with prior. Pain was non-exertional and atypical in nature thought not to be due to cardiac etiology. Cath in 2007 with normal coronaries.  Mother has history of arrhythmias. Father with history of hypertension. No family history of premature CAD. No known SCD.    Past Medical History:  Diagnosis Date  . Full dentures   . GERD (gastroesophageal reflux disease)    occasional , takes tums  . Glaucoma, both eyes   . History of cellulitis    2006  LLE  . History of chest pain    had cardiac cath 10-16-2004 (in epic)  showed  normal coronaries that are tortous, patent renal arteries, normal LVF with ef 50-55%, noncardiac chest pain  . History of CVA (cerebrovascular accident) without residual deficits    11-29-2019  per pt happened in 2007 and has not had cva/ tia sympomts since  . Hypertension    followed by pcp  . Left foot infection 10/2019   s/p  10-05-2019 left bunionectomy;  hospital admission 10-14-2019 acute osteomyelitis/ wound dehiscence  s/p  I&D twice , abx device , and external fixator  . Migraines    11-29-2019 per pt previously had botox injection for prevention, last had 3 yrs ago,  now uses breathing , meditation, and minimizes triggers (bright light/ loud noise)  . Neuropathy    feet  . OA (osteoarthritis)   . Rash    11-29-2019  per pt had PICC line removed yesterday , has rash and bruising in this area of right arm    Past Surgical History:  Procedure Laterality Date  . BONE BIOPSY Left 10/14/2019   Procedure: BONE BIOPSY PROXIMAL PHALANX;  Surgeon: Evelina Bucy, DPM;  Location: [place];  Service: Podiatry;  Laterality: Left;  . BUNIONECTOMY Left 10-05-2019  dr Milinda Pointer _0    great toe  . CARDIAC CATHETERIZATION  10-16-2004  dr Einar Gip   normal coronaries (tortuous), normal LVF  . CATARACT EXTRACTION W/ INTRAOCULAR LENS  IMPLANT, BILATERAL  2013  . COLONOSCOPY  last one 04-23-2009  dr d. Sharlett Iles  . FOOT SURGERY Right unsure  date   bunionectomy and hammertoe  . HARDWARE REMOVAL Right 06/ 2015  _0    right foot s/p bunionectomy/ hammertoe conrrection  . HARDWARE REMOVAL Left 12/05/2019   Procedure: HARDWARE REMOVAL ,WOUND CLOSURE OF FIRST TOE;  Surgeon: Evelina Bucy, DPM;  Location: [place];  Service: Podiatry;  Laterality: Left;  . IRRIGATION AND DEBRIDEMENT FOOT Left 10/14/2019   Procedure: INCISION AND DRAINAGE FOR LEFT FOOT INFECTION, COMPLEX;  Surgeon: Evelina Bucy, DPM;  Location: [place];  Service: Podiatry;  Laterality: Left;  . IRRIGATION AND DEBRIDEMENT  FOOT Left 10/17/2019   Procedure: Debridement and Irrigation of Left Foot; Removal of Silicone Implant; Insertion of Antibiotic Spacer; Application of External Fixator; Possible Application of Wound VAC;  Surgeon: Evelina Bucy, DPM;  Location: [place];  Service: Podiatry;  Laterality: Left;  Pre-op Block POP/SAPH; MAC  . KNEE ARTHROSCOPY Left 2018  . ORIF CALCANEOUS FRACTURE Left 10/17/2019   Procedure: Application External Fixation;  Surgeon: Evelina Bucy, DPM;  Location: [place];  Service: Podiatry;  Laterality: Left;    Current Medications: Current Meds  Medication Sig  . bimatoprost (LUMIGAN) 0.01 % SOLN Place 1 drop into both eyes at bedtime.   . brimonidine (ALPHAGAN) 0.15 % ophthalmic solution Place 1 drop into both eyes 2 (two) times daily.   . calcium carbonate (TUMS - DOSED IN MG ELEMENTAL CALCIUM) 500 MG chewable tablet Chew 1 tablet by mouth as needed for indigestion or heartburn.  . dorzolamide-timolol (COSOPT) 22.3-6.8 MG/ML ophthalmic solution Place 1 drop into both eyes 2 (two) times daily.   . ferrous sulfate 325 (65 FE) MG tablet Take 325 mg by mouth daily with breakfast.  . gabapentin (NEURONTIN) 300 MG capsule Take 300-600 mg by mouth See admin instructions. Take one capsule (391m) in the morning and 2 capsules (6050m at bedtime.  . hydrochlorothiazide (HYDRODIURIL) 25 MG tablet Take 25 mg by mouth daily.  . Marland KitchenYDROcodone-acetaminophen (NORCO) 5-325 MG tablet Take 1 tablet by mouth every 6 (six) hours as needed for moderate pain.  . Marland Kitchenatanoprost (XALATAN) 0.005 % ophthalmic solution 1 drop at bedtime.  . Multiple Vitamin (MULTIVITAMIN) capsule Take 1 capsule by mouth daily.  . Marland KitchenARCAN 4 MG/0.1ML LIQD nasal spray kit SMARTSIG:1 Spray(s) Both Nares  . omega-3 acid ethyl esters (LOVAZA) 1 g capsule TAKE 1 CAPSULE BY MOUTH ONCE A DAY TO LOWER CHOLESTEROL  . Omega-3 Fatty Acids (FISH OIL) 1000 MG CAPS Take 1,000 mg by mouth daily.  . ondansetron (ZOFRAN) 4 MG tablet Take 1 tablet  (4 mg total) by mouth every 8 (eight) hours as needed.  . Polyvinyl Alcohol-Povidone (REFRESH OP) Place 1 drop into both eyes 3 (three) times daily as needed (dry eyes).   . potassium chloride SA (K-DUR,KLOR-CON) 20 MEQ tablet Take 20 mEq by mouth daily.  . rivaroxaban (XARELTO) 10 MG TABS tablet Take 1 tablet (10 mg total) by mouth daily.  . silver sulfADIAZINE (SILVADENE) 1 % cream Apply pea-sized amount to wound daily.  . Marland Kitchenriamcinolone cream (KENALOG) 0.1 % Apply 1 application topically 2 (two) times daily.  . [DISCONTINUED] aspirin EC 81 MG tablet Take 81 mg by mouth daily.     Allergies:   Iodine   Social History   Socioeconomic History  . Marital status: Widowed    Spouse name: Not on file  . Number of children: Not on file  . Years of education: Not on file  . Highest education level: Not on file  Occupational History  .  Not on file  Tobacco Use  . Smoking status: Never Smoker  . Smokeless tobacco: Never Used  Vaping Use  . Vaping Use: Never used  Substance and Sexual Activity  . Alcohol use: No  . Drug use: No  . Sexual activity: Not on file  Other Topics Concern  . Not on file  Social History Narrative  . Not on file   Social Determinants of Health   Financial Resource Strain:   . Difficulty of Paying Living Expenses: Not on file  Food Insecurity:   . Worried About Charity fundraiser in the Last Year: Not on file  . Ran Out of Food in the Last Year: Not on file  Transportation Needs:   . Lack of Transportation (Medical): Not on file  . Lack of Transportation (Non-Medical): Not on file  Physical Activity:   . Days of Exercise per Week: Not on file  . Minutes of Exercise per Session: Not on file  Stress:   . Feeling of Stress : Not on file  Social Connections:   . Frequency of Communication with Friends and Family: Not on file  . Frequency of Social Gatherings with Friends and Family: Not on file  . Attends Religious Services: Not on file  . Active Member  of Clubs or Organizations: Not on file  . Attends Archivist Meetings: Not on file  . Marital Status: Not on file     Family History: The patient's family history includes Hypertension in her mother.  ROS:   Please see the history of present illness.    All other systems reviewed and are negative.  EKGs/Labs/Other Studies Reviewed:    The following studies were reviewed today: CTA chest 12/07/19: 1. Respiratory motion degrades the distal upper lobe and the subsegmental lower lobe branches. But otherwise there is no acute pulmonary embolus. 2. No other acute findings in the chest. Advanced thoracic spine degeneration.  ECG: NSR, PACs, TWI in anterolateral and inferior leads (chronic)  Cath 2007 reportedly with normal coronaries  EKG:  EKG is ordered today.  The ekg ordered today demonstrates SR with HR 64, mild diffuse ST-T wave flattening and TWI that were present on prior ECGs  Recent Labs: 11/07/2019: Magnesium 1.8 12/07/2019: ALT 21; BUN 14; Creatinine, Ser 0.81; Hemoglobin 12.1; Platelets 246; Potassium 3.7; Sodium 137  Recent Lipid Panel No results found for: CHOL, TRIG, HDL, CHOLHDL, VLDL, LDLCALC, LDLDIRECT  Physical Exam:    VS:  BP (!) 150/84   Pulse 64   Ht _0  (1.575 m)   Wt 194 lb (88 kg)   SpO2 94%   BMI 35.48 kg/m     Wt Readings from Last 3 Encounters:  01/07/20 194 lb (88 kg)  12/07/19 198 lb (89.8 kg)  12/05/19 198 lb 3.2 oz (89.9 kg)     GEN: Comfortable, mild tremor, NAD HEENT: Normal NECK: No JVD; No carotid bruits CARDIAC: RR, 2/6 systolic murmur best heard at RUSB RESPIRATORY:  Clear to auscultation without rales, wheezing or rhonchi  ABDOMEN: Soft, non-tender, non-distended MUSCULOSKELETAL:  Warm, left boot in place. Trace edema to mid-shin on the left SKIN: Warm and dry NEUROLOGIC:  Alert and oriented x 3 PSYCHIATRIC:  Normal affect   ASSESSMENT:    1. Atypical chest pain   2. Essential hypertension   3. Cerebrovascular  accident (CVA), unspecified mechanism ([place])   4. Acute deep vein thrombosis (DVT) of axillary vein of right upper extremity (HCC)   5. Systolic murmur  PLAN:    In order of problems listed above:  #Atypical Chest Pain: Reports pain in right breast that occurs randomly and is relieved by lifting her arm and moving it around. No exertional symptoms. Sounds atypical and non-cardiac in nature. ECG has diffuse TWI which are chronic and unchanged. -Does not appear to be cardiac pain -Will continue treatment of PICC associated DVT as pain appears to be mainly in the arm region  #Prior CVA: -Start crestor 93m daily -Hold ASA for now as patient is on xarelto -Check lipid panel  #Systolic Murmur: 2/6 systolic murmur. Possibly mild AS. Occurs early in systole. -Check TTE  #PICC associated DVT: -On xarelto -[facility] #Hypertension:  Not well controlled per the patient. Elevated at 150s/80s here. -Start amlodipine 558mdaily -Continue HCTZ 258maily -Keep BP log for 5days and follow-up with results  #Obesity with BMI 35: -Check HgA1C and lipid panel  Follow-up in 1 month   Signed, HeaFreada BergeronD  01/07/2020 5:18 PM    [place]

## 2020-01-07 ENCOUNTER — Encounter: Payer: Self-pay | Admitting: Cardiology

## 2020-01-07 ENCOUNTER — Other Ambulatory Visit: Payer: Self-pay

## 2020-01-07 ENCOUNTER — Ambulatory Visit (INDEPENDENT_AMBULATORY_CARE_PROVIDER_SITE_OTHER): Payer: Medicare HMO | Admitting: Cardiology

## 2020-01-07 VITALS — BP 150/84 | HR 64 | Ht 62.0 in | Wt 194.0 lb

## 2020-01-07 DIAGNOSIS — R0789 Other chest pain: Secondary | ICD-10-CM

## 2020-01-07 DIAGNOSIS — R011 Cardiac murmur, unspecified: Secondary | ICD-10-CM

## 2020-01-07 DIAGNOSIS — I10 Essential (primary) hypertension: Secondary | ICD-10-CM

## 2020-01-07 DIAGNOSIS — I82A11 Acute embolism and thrombosis of right axillary vein: Secondary | ICD-10-CM

## 2020-01-07 DIAGNOSIS — I639 Cerebral infarction, unspecified: Secondary | ICD-10-CM | POA: Diagnosis not present

## 2020-01-07 MED ORDER — AMLODIPINE BESYLATE 5 MG PO TABS: 5.0000 mg | ORAL_TABLET | Freq: Every day | ORAL | 3 refills | Status: DC

## 2020-01-07 MED ORDER — ROSUVASTATIN CALCIUM 10 MG PO TABS: 10.0000 mg | ORAL_TABLET | Freq: Every day | ORAL | 3 refills | Status: DC

## 2020-01-07 NOTE — Patient Instructions (Addendum)
Medication Instructions:  .Your physician has recommended you make the following change in your medication:  1.  STOP the Aspirin 2.  START Amlodipine 5 mg taking 1 daily 3.  START Crestor 10 mg taking 1 daily  *If you need a refill on your cardiac medications before your next appointment, please call your pharmacy*   Lab Work: When come for ECHO:  Fasting LIPID & HGBA1C  If you have labs (blood work) drawn today and your tests are completely normal, you will receive your results only by: Marland Kitchen MyChart Message (if you have MyChart) OR . A paper copy in the mail If you have any lab test that is abnormal or we need to change your treatment, we will call you to review the results.   Testing/Procedures: Your physician has requested that you have an echocardiogram. Echocardiography is a painless test that uses sound waves to create images of your heart. It provides your doctor with information about the size and shape of your heart and how well your heart's chambers and valves are working. This procedure takes approximately one hour. There are no restrictions for this procedure.    Follow-Up: At [facility], you and your health needs are our priority.  As part of our continuing mission to provide you with exceptional heart care, we have created designated Provider Care Teams.  These Care Teams include your primary Cardiologist (physician) and Advanced Practice Providers (APPs -  Physician Assistants and Nurse Practitioners) who all work together to provide you with the care you need, when you need it.  We recommend signing up for the patient portal called "MyChart".  Sign up information is provided on this After Visit Summary.  MyChart is used to connect with patients for Virtual Visits (Telemedicine).  Patients are able to view lab/test results, encounter notes, upcoming appointments, etc.  Non-urgent messages can be sent to your provider as well.   To learn more about what you can do with  MyChart, go to [email or web address].    Your next appointment:   1 month(s)  The format for your next appointment:   In Person  Provider:   Gwyndolyn Kaufman, MD   Other Instructions Monitor your blood pressures when you get up each morning and before you go to bed each evening.  Keep a log.. Call me with these readings after 5 days or you can send to me via mychart.  Echocardiogram An echocardiogram is a procedure that uses painless sound waves (ultrasound) to produce an image of the heart. Images from an echocardiogram can provide important information about:  Signs of coronary artery disease (CAD).  Aneurysm detection. An aneurysm is a weak or damaged part of an artery wall that bulges out from the normal force of blood pumping through the body.  Heart size and shape. Changes in the size or shape of the heart can be associated with certain conditions, including heart failure, aneurysm, and CAD.  Heart muscle function.  Heart valve function.  Signs of a past heart attack.  Fluid buildup around the heart.  Thickening of the heart muscle.  A tumor or infectious growth around the heart valves. Tell a health care provider about:  Any allergies you have.  All medicines you are taking, including vitamins, herbs, eye drops, creams, and over-the-counter medicines.  Any blood disorders you have.  Any surgeries you have had.  Any medical conditions you have.  Whether you are pregnant or may be pregnant. What are the risks? Generally, this is a  safe procedure. However, problems may occur, including:  Allergic reaction to dye (contrast) that may be used during the procedure. What happens before the procedure? No specific preparation is needed. You may eat and drink normally. What happens during the procedure?   An IV tube may be inserted into one of your veins.  You may receive contrast through this tube. A contrast is an injection that improves the quality of the  pictures from your heart.  A gel will be applied to your chest.  A wand-like tool (transducer) will be moved over your chest. The gel will help to transmit the sound waves from the transducer.  The sound waves will harmlessly bounce off of your heart to allow the heart images to be captured in real-time motion. The images will be recorded on a computer. The procedure may vary among health care providers and hospitals. What happens after the procedure?  You may return to your normal, everyday life, including diet, activities, and medicines, unless your health care provider tells you not to do that. Summary  An echocardiogram is a procedure that uses painless sound waves (ultrasound) to produce an image of the heart.  Images from an echocardiogram can provide important information about the size and shape of your heart, heart muscle function, heart valve function, and fluid buildup around your heart.  You do not need to do anything to prepare before this procedure. You may eat and drink normally.  After the echocardiogram is completed, you may return to your normal, everyday life, unless your health care provider tells you not to do that. This information is not intended to replace advice given to you by your health care provider. Make sure you discuss any questions you have with your health care provider. Document Revised: 07/20/2018 Document Reviewed: 05/01/2016 Elsevier Patient Education  [place].

## 2020-01-09 NOTE — Progress Notes (Signed)
  Subjective:  Patient ID: Alexis Ball, female    DOB: 03-25-1956,  MRN: [id number]  Chief Complaint  Patient presents with  . routine foot care    nail trim    DOS: 12/05/19 Procedure: Removal of external fixator left with wound closure  64 y.o. female presents with the above complaint.  Overall doing okay with her foot states that she is doing therapy.  Here for care of her nails today.  Objective:  Physical Exam: Dressing left intact.  Onychomycosis of the toenails noted with dystrophic nail changes, slightly delayed capillary refill  Assessment:   1. Onychomycosis of multiple toenails with type 2 diabetes mellitus and peripheral angiopathy ([place])      Plan:  Patient was evaluated and treated and all questions answered.  Onychomycosis, diabetes -Patient is diabetic with a qualifying condition for at risk foot care.  Procedure: Nail Debridement Rationale: Patient meets criteria for routine foot care due to PAD Type of Debridement: manual, sharp debridement. Instrumentation: Nail nipper, rotary burr. Number of Nails: 10    No follow-ups on file.

## 2020-01-22 ENCOUNTER — Ambulatory Visit (INDEPENDENT_AMBULATORY_CARE_PROVIDER_SITE_OTHER): Payer: Medicare Other | Admitting: Podiatry

## 2020-01-22 ENCOUNTER — Other Ambulatory Visit: Payer: Self-pay

## 2020-01-22 DIAGNOSIS — M7752 Other enthesopathy of left foot: Secondary | ICD-10-CM

## 2020-01-22 DIAGNOSIS — T8484XA Pain due to internal orthopedic prosthetic devices, implants and grafts, initial encounter: Secondary | ICD-10-CM

## 2020-01-22 MED ORDER — GABAPENTIN 300 MG PO CAPS: 300.0000 mg | ORAL_CAPSULE | Freq: Three times a day (TID) | ORAL | 3 refills | Status: DC

## 2020-01-22 MED ORDER — HYDROCODONE-ACETAMINOPHEN 5-325 MG PO TABS
1.0000 | ORAL_TABLET | Freq: Four times a day (QID) | ORAL | 0 refills | Status: DC | PRN
Start: 2020-01-22 — End: 2020-01-25

## 2020-01-22 NOTE — Progress Notes (Signed)
  Subjective:  Patient ID: Alexis Ball, female    DOB: 1956/01/09,  MRN: [id number]  Chief Complaint  Patient presents with  . Foot Pain    Pain in foot   DOS: 12/05/19 Procedure: Removal of external fixator left with wound closure  64 y.o. female presents with the above complaint.  Still having some pain and swelling the dorsal foot but not so much at the first metatarsal area requesting refill gabapentin  Objective:  Physical Exam: tenderness at the surgical site, local edema noted and calf supple, nontender. Stiffness noted of the 1st MPJ, reducible upon manual stretch Incision: healed. No open wounds.  Assessment:   1. Capsulitis of metatarsophalangeal (MTP) joint of left foot   2. Painful orthopaedic hardware [facility])      Plan:  Patient was evaluated and treated and all questions answered.  Post-operative State -Continues to do well.  Continue working on physical therapy and transition to normal shoes when appropriate  Return in about 1 month (around 02/22/2020).

## 2020-01-25 ENCOUNTER — Other Ambulatory Visit: Payer: Self-pay | Admitting: Podiatry

## 2020-01-25 NOTE — Telephone Encounter (Signed)
Please advise 

## 2020-01-31 ENCOUNTER — Ambulatory Visit (HOSPITAL_COMMUNITY): Payer: Medicare Other | Attending: Cardiology

## 2020-01-31 ENCOUNTER — Other Ambulatory Visit: Payer: Self-pay

## 2020-01-31 ENCOUNTER — Other Ambulatory Visit: Payer: Medicare Other | Admitting: *Deleted

## 2020-01-31 DIAGNOSIS — I10 Essential (primary) hypertension: Secondary | ICD-10-CM | POA: Diagnosis not present

## 2020-01-31 DIAGNOSIS — R0789 Other chest pain: Secondary | ICD-10-CM

## 2020-01-31 LAB — ECHOCARDIOGRAM COMPLETE
Area-P 1/2: 3.08 cm2
P 1/2 time: 511 msec
S' Lateral: 2.9 cm

## 2020-02-01 LAB — LIPID PANEL
Chol/HDL Ratio: 2.2 ratio (ref 0.0–4.4)
Cholesterol, Total: 109 mg/dL (ref 100–199)
HDL: 50 mg/dL (ref >39)
LDL Chol Calc (NIH): 43 mg/dL (ref 0–99)
Triglycerides: 80 mg/dL (ref 0–149)
VLDL Cholesterol Cal: 16 mg/dL (ref 5–40)

## 2020-02-01 LAB — HEMOGLOBIN A1C
Est. average glucose Bld gHb Est-mCnc: 117 mg/dL
Hgb A1c MFr Bld: 5.7 % — ABNORMAL HIGH (ref 4.8–5.6)

## 2020-02-04 MED ORDER — HYDROCODONE-ACETAMINOPHEN 5-325 MG PO TABS
1.0000 | ORAL_TABLET | Freq: Four times a day (QID) | ORAL | 0 refills | Status: DC | PRN
Start: 2020-02-04 — End: 2020-02-26

## 2020-02-22 ENCOUNTER — Other Ambulatory Visit: Payer: Self-pay | Admitting: Registered Nurse

## 2020-02-22 DIAGNOSIS — Z1231 Encounter for screening mammogram for malignant neoplasm of breast: Secondary | ICD-10-CM

## 2020-02-26 ENCOUNTER — Ambulatory Visit: Payer: Medicare Other | Admitting: Podiatry

## 2020-02-26 ENCOUNTER — Other Ambulatory Visit: Payer: Self-pay | Admitting: Podiatry

## 2020-02-26 ENCOUNTER — Encounter: Payer: Self-pay | Admitting: Podiatry

## 2020-02-27 NOTE — Telephone Encounter (Signed)
Please advise 

## 2020-02-28 ENCOUNTER — Other Ambulatory Visit: Payer: Self-pay | Admitting: Podiatry

## 2020-02-28 MED ORDER — HYDROCODONE-ACETAMINOPHEN 5-325 MG PO TABS: 1.0000 | ORAL_TABLET | Freq: Four times a day (QID) | ORAL | 0 refills | Status: DC | PRN

## 2020-02-28 MED ORDER — ONDANSETRON HCL 4 MG PO TABS
4.0000 mg | ORAL_TABLET | Freq: Three times a day (TID) | ORAL | 0 refills | Status: DC | PRN
Start: 2020-02-28 — End: 2020-06-26

## 2020-02-28 NOTE — Telephone Encounter (Signed)
Please advise 

## 2020-03-03 NOTE — Progress Notes (Deleted)
Cardiology Office Note:    Date:  03/03/2020   ID:  Alexis Ball, DOB 03-Dec-1955, MRN [id number]  PCP:  Patient, No Pcp Per  [facility] Cardiologist:  Freada Bergeron, MD  [facility] Electrophysiologist:  None   Referring MD: Lucianne Lei, MD    History of Present Illness:    Alexis Ball is a 64 y.o. female with a hx of HTN, CVA in 2007, PICC associated DVT, and GERD who returns to clinic for follow-up.  During last visit, patient presented with atypical chest pain that was non-exertional. CTA was negative for PE. TTE with normal BiV function without significant valvular abnormalities. Since our visit, the patient reports.    Past Medical History:  Diagnosis Date  . Full dentures   . GERD (gastroesophageal reflux disease)    occasional , takes tums  . Glaucoma, both eyes   . History of cellulitis    2006  LLE  . History of chest pain    had cardiac cath 10-16-2004 (in epic)  showed normal coronaries that are tortous, patent renal arteries, normal LVF with ef 50-55%, noncardiac chest pain  . History of CVA (cerebrovascular accident) without residual deficits    11-29-2019  per pt happened in 2007 and has not had cva/ tia sympomts since  . Hypertension    followed by pcp  . Left foot infection 10/2019   s/p  10-05-2019 left bunionectomy;  hospital admission 10-14-2019 acute osteomyelitis/ wound dehiscence  s/p  I&D twice , abx device , and external fixator  . Migraines    11-29-2019 per pt previously had botox injection for prevention, last had 3 yrs ago,  now uses breathing , meditation, and minimizes triggers (bright light/ loud noise)  . Neuropathy    feet  . OA (osteoarthritis)   . Rash    11-29-2019  per pt had PICC line removed yesterday , has rash and bruising in this area of right arm    Past Surgical History:  Procedure Laterality Date  . BONE BIOPSY Left 10/14/2019   Procedure: BONE BIOPSY PROXIMAL PHALANX;  Surgeon: Evelina Bucy,  DPM;  Location: [place];  Service: Podiatry;  Laterality: Left;  . BUNIONECTOMY Left 10-05-2019  dr Milinda Pointer _0    great toe  . CARDIAC CATHETERIZATION  10-16-2004  dr Einar Gip   normal coronaries (tortuous), normal LVF  . CATARACT EXTRACTION W/ INTRAOCULAR LENS  IMPLANT, BILATERAL  2013  . COLONOSCOPY  last one 04-23-2009  dr d. Sharlett Iles  . FOOT SURGERY Right unsure date   bunionectomy and hammertoe  . HARDWARE REMOVAL Right 06/ 2015  _1    right foot s/p bunionectomy/ hammertoe conrrection  . HARDWARE REMOVAL Left 12/05/2019   Procedure: HARDWARE REMOVAL ,WOUND CLOSURE OF FIRST TOE;  Surgeon: Evelina Bucy, DPM;  Location: [place];  Service: Podiatry;  Laterality: Left;  . IRRIGATION AND DEBRIDEMENT FOOT Left 10/14/2019   Procedure: INCISION AND DRAINAGE FOR LEFT FOOT INFECTION, COMPLEX;  Surgeon: Evelina Bucy, DPM;  Location: [place];  Service: Podiatry;  Laterality: Left;  . IRRIGATION AND DEBRIDEMENT FOOT Left 10/17/2019   Procedure: Debridement and Irrigation of Left Foot; Removal of Silicone Implant; Insertion of Antibiotic Spacer; Application of External Fixator; Possible Application of Wound VAC;  Surgeon: Evelina Bucy, DPM;  Location: [place];  Service: Podiatry;  Laterality: Left;  Pre-op Block POP/SAPH; MAC  . KNEE ARTHROSCOPY Left 2018  . ORIF CALCANEOUS FRACTURE Left 10/17/2019   Procedure: Application External  Fixation;  Surgeon: Evelina Bucy, DPM;  Location: [place];  Service: Podiatry;  Laterality: Left;    Current Medications: No outpatient medications have been marked as taking for the 03/05/20 encounter (Appointment) with Freada Bergeron, MD.     Allergies:   Iodine   Social History   Socioeconomic History  . Marital status: Widowed    Spouse name: Not on file  . Number of children: Not on file  . Years of education: Not on file  . Highest education level: Not on file  Occupational History  . Not on file  Tobacco Use  . Smoking status:  Never Smoker  . Smokeless tobacco: Never Used  Vaping Use  . Vaping Use: Never used  Substance and Sexual Activity  . Alcohol use: No  . Drug use: No  . Sexual activity: Not on file  Other Topics Concern  . Not on file  Social History Narrative  . Not on file   Social Determinants of Health   Financial Resource Strain:   . Difficulty of Paying Living Expenses: Not on file  Food Insecurity:   . Worried About Charity fundraiser in the Last Year: Not on file  . Ran Out of Food in the Last Year: Not on file  Transportation Needs:   . Lack of Transportation (Medical): Not on file  . Lack of Transportation (Non-Medical): Not on file  Physical Activity:   . Days of Exercise per Week: Not on file  . Minutes of Exercise per Session: Not on file  Stress:   . Feeling of Stress : Not on file  Social Connections:   . Frequency of Communication with Friends and Family: Not on file  . Frequency of Social Gatherings with Friends and Family: Not on file  . Attends Religious Services: Not on file  . Active Member of Clubs or Organizations: Not on file  . Attends Archivist Meetings: Not on file  . Marital Status: Not on file     Family History: The patient's ***family history includes Hypertension in her mother.  ROS:   Please see the history of present illness.    *** All other systems reviewed and are negative.  EKGs/Labs/Other Studies Reviewed:    The following studies were reviewed today: CTA chest 12/07/19: 1. Respiratory motion degrades the distal upper lobe and the subsegmental lower lobe branches. But otherwise there is no acute pulmonary embolus. 2. No other acute findings in the chest. Advanced thoracic spine degeneration.  ECG: NSR, PACs, TWI in anterolateral and inferior leads (chronic)  Cath 2007 reportedly with normal coronaries  TTE 01/31/20: IMPRESSIONS    1. Left ventricular ejection fraction, by estimation, is 55 to 60%. The  left ventricle  has normal function. The left ventricle has no regional  wall motion abnormalities. Left ventricular diastolic parameters are  consistent with Grade I diastolic  dysfunction (impaired relaxation).  2. Right ventricular systolic function is normal. The right ventricular  size is normal. There is normal pulmonary artery systolic pressure. The  estimated right ventricular systolic pressure is 18.8 mmHg.  3. The mitral valve is normal in structure. No evidence of mitral valve  regurgitation. No evidence of mitral stenosis.  4. The aortic valve is tricuspid. Aortic valve regurgitation is trivial.  No aortic stenosis is present.  5. The inferior vena cava is normal in size with greater than 50%  respiratory variability, suggesting right atrial pressure of 3 mmHg.    EKG:  EKG  is *** ordered today.  The ekg ordered today demonstrates ***  Recent Labs: 11/07/2019: Magnesium 1.8 12/07/2019: ALT 21; BUN 14; Creatinine, Ser 0.81; Hemoglobin 12.1; Platelets 246; Potassium 3.7; Sodium 137  Recent Lipid Panel    Component Value Date/Time   CHOL 109 01/31/2020 0924   TRIG 80 01/31/2020 0924   HDL 50 01/31/2020 0924   CHOLHDL 2.2 01/31/2020 0924   LDLCALC 43 01/31/2020 0924     Risk Assessment/Calculations:   {Does this patient have ATRIAL FIBRILLATION?:[id number]}   Physical Exam:    VS:  There were no vitals taken for this visit.    Wt Readings from Last 3 Encounters:  01/07/20 194 lb (88 kg)  12/07/19 198 lb (89.8 kg)  12/05/19 198 lb 3.2 oz (89.9 kg)     GEN: *** Well nourished, well developed in no acute distress HEENT: Normal NECK: No JVD; No carotid bruits LYMPHATICS: No lymphadenopathy CARDIAC: ***RRR, no murmurs, rubs, gallops RESPIRATORY:  Clear to auscultation without rales, wheezing or rhonchi  ABDOMEN: Soft, non-tender, non-distended MUSCULOSKELETAL:  No edema; No deformity  SKIN: Warm and dry NEUROLOGIC:  Alert and oriented x 3 PSYCHIATRIC:  Normal affect    ASSESSMENT:    No diagnosis found. PLAN:    In order of problems listed above:  #Atypical Chest Pain: Reports pain in right breast that occurs randomly and is relieved by lifting her arm and moving it around. No exertional symptoms. Sounds atypical and non-cardiac in nature. ECG has diffuse TWI which are chronic and unchanged. -Does not appear to be cardiac pain -Will continue treatment of PICC associated DVT as pain appears to be mainly in the arm region  #Prior CVA: -Continue crestor 97m daily -Hold ASA for now as patient is on xarelto -Check lipid panel  #Systolic Murmur: Benign with normal TTE. -Continue to monitor   #PICC associated DVT: -On xarelto -[facility] #Hypertension:  Not well controlled per the patient. Elevated at 150s/80s here. -Start amlodipine 550mdaily -Continue HCTZ 25106maily -Keep BP log for 5days and follow-up with results  #Obesity with BMI 35: -Check HgA1C and lipid panel    Shared Decision Making/Informed Consent   {Are you ordering a CV Procedure (e.g. stress test, cath, DCCV, TEE, etc)?   Press F2        :[id number]} Medication Adjustments/Labs and Tests Ordered: Current medicines are reviewed at length with the patient today.  Concerns regarding medicines are outlined above.  No orders of the defined types were placed in this encounter.  No orders of the defined types were placed in this encounter.   There are no Patient Instructions on file for this visit.   Signed, HeaFreada BergeronD  03/03/2020 8:22 AM    [facility]

## 2020-03-05 ENCOUNTER — Ambulatory Visit: Payer: Medicare Other | Admitting: Cardiology

## 2020-03-14 ENCOUNTER — Ambulatory Visit (INDEPENDENT_AMBULATORY_CARE_PROVIDER_SITE_OTHER): Payer: Medicare Other | Admitting: Podiatry

## 2020-03-14 ENCOUNTER — Other Ambulatory Visit: Payer: Self-pay

## 2020-03-14 ENCOUNTER — Ambulatory Visit (INDEPENDENT_AMBULATORY_CARE_PROVIDER_SITE_OTHER): Payer: Medicare Other

## 2020-03-14 DIAGNOSIS — M7752 Other enthesopathy of left foot: Secondary | ICD-10-CM

## 2020-03-14 DIAGNOSIS — M205X2 Other deformities of toe(s) (acquired), left foot: Secondary | ICD-10-CM

## 2020-03-14 DIAGNOSIS — M86672 Other chronic osteomyelitis, left ankle and foot: Secondary | ICD-10-CM

## 2020-03-14 NOTE — Progress Notes (Signed)
  Subjective:  Patient ID: Alexis Ball, female    DOB: [birth year],  MRN: [id number]  Chief Complaint  Patient presents with  . Foot Pain    Left sub 5th shooting pains   DOS: 12/05/19 Procedure: Removal of external fixator left with wound closure  64 y.o. female presents with the above complaint.  Still having pain and swelling preventing her from doing what she would like to do like walking to get the mail.  Objective:  Physical Exam: tenderness at the surgical site, local edema noted and calf supple, nontender. Stiffness noted of the 1st MPJ, reducible upon manual stretch. POP 5th metatarsal Incision: healed. No open wounds.  Assessment:   1. Capsulitis of metatarsophalangeal (MTP) joint of left foot   2. Other chronic osteomyelitis of left foot (HCC)   3. Hallux limitus of left foot    Plan:  Patient was evaluated and treated and all questions answered.  Post-operative State -Having more pain now at the first and fifth metatarsal areas. Toe is clinically stiff but painful. I think it is reasonable to get a new MRI to r/o residual OM and plan for implant arthroplasty if no OM present. May or may not benefit from 5th met head resection. -MRI ordered. -F/u in 4 weeks for review  Return in about 4 weeks (around 04/11/2020) for MRI review.

## 2020-03-25 ENCOUNTER — Encounter: Payer: Self-pay | Admitting: Podiatry

## 2020-03-25 ENCOUNTER — Other Ambulatory Visit: Payer: Self-pay | Admitting: Podiatry

## 2020-03-30 NOTE — Telephone Encounter (Signed)
Please advise 

## 2020-04-01 MED ORDER — HYDROCODONE-ACETAMINOPHEN 5-325 MG PO TABS
1.0000 | ORAL_TABLET | Freq: Four times a day (QID) | ORAL | 0 refills | Status: DC | PRN
Start: 2020-04-01 — End: 2020-04-15

## 2020-04-02 ENCOUNTER — Ambulatory Visit (INDEPENDENT_AMBULATORY_CARE_PROVIDER_SITE_OTHER): Payer: Medicare Other | Admitting: Podiatry

## 2020-04-02 ENCOUNTER — Other Ambulatory Visit: Payer: Self-pay

## 2020-04-02 ENCOUNTER — Encounter: Payer: Self-pay | Admitting: Podiatry

## 2020-04-02 DIAGNOSIS — E1142 Type 2 diabetes mellitus with diabetic polyneuropathy: Secondary | ICD-10-CM | POA: Diagnosis not present

## 2020-04-02 DIAGNOSIS — B351 Tinea unguium: Secondary | ICD-10-CM

## 2020-04-02 DIAGNOSIS — L84 Corns and callosities: Secondary | ICD-10-CM | POA: Diagnosis not present

## 2020-04-03 ENCOUNTER — Ambulatory Visit
Admission: RE | Admit: 2020-04-03 | Discharge: 2020-04-03 | Disposition: A | Discharge status: Home / Self Care | Payer: Medicare Other | Source: Ambulatory Visit | Attending: Podiatry | Admitting: Podiatry

## 2020-04-03 DIAGNOSIS — M86672 Other chronic osteomyelitis, left ankle and foot: Secondary | ICD-10-CM

## 2020-04-09 NOTE — Progress Notes (Signed)
Subjective: Alexis Ball is a pleasant 64 y.o. female patient seen today painful callus(es) b/l feet and painful mycotic toenails b/l that are difficult to trim. Pain interferes with ambulation. Aggravating factors include wearing enclosed shoe gear. Pain is relieved with periodic professional debridement.   She has been followed by Dr. March Rummage for osteomyelitis of the left foot and will have MRI on tomorrow to rule out any remaining osteomyelitis.  She voices no new pedal problems on today's visit.  Past Medical History:  Diagnosis Date  . Full dentures   . GERD (gastroesophageal reflux disease)    occasional , takes tums  . Glaucoma, both eyes   . History of cellulitis    2006  LLE  . History of chest pain    had cardiac cath 10-16-2004 (in epic)  showed normal coronaries that are tortous, patent renal arteries, normal LVF with ef 50-55%, noncardiac chest pain  . History of CVA (cerebrovascular accident) without residual deficits    11-29-2019  per pt happened in 2007 and has not had cva/ tia sympomts since  . Hypertension    followed by pcp  . Left foot infection 10/2019   s/p  10-05-2019 left bunionectomy;  hospital admission 10-14-2019 acute osteomyelitis/ wound dehiscence  s/p  I&D twice , abx device , and external fixator  . Migraines    11-29-2019 per pt previously had botox injection for prevention, last had 3 yrs ago,  now uses breathing , meditation, and minimizes triggers (bright light/ loud noise)  . Neuropathy    feet  . OA (osteoarthritis)   . Rash    11-29-2019  per pt had PICC line removed yesterday , has rash and bruising in this area of right arm    Patient Active Problem List   Diagnosis Date Noted  . Osteomyelitis of toe of right foot ([place])   . Wound dehiscence   . PICC (peripherally inserted central catheter) in place 11/23/2019  . Medication monitoring encounter 11/23/2019  . Osteomyelitis of ankle or foot, acute, right ([place]) 11/22/2019  . Superficial  venous thrombosis of arm, right 11/02/2019  . Abscess of bursa of left foot 10/14/2019  . Foot abscess, left 10/14/2019  . Wound infection   . Unilateral primary osteoarthritis, left knee 04/27/2019  . Chondromalacia patellae, left knee 07/02/2016  . Arthritis of left knee 02/25/2016  . Vaginitis and vulvovaginitis, unspecified 08/08/2013  . Leiomyoma of uterus, unspecified 08/08/2013  . Atypical chest pain 03/13/2013  . Abnormal ECG 03/13/2013  . Obesity, unspecified 03/13/2013  . Hypertension     Current Outpatient Medications on File Prior to Visit  Medication Sig Dispense Refill  . amLODipine (NORVASC) 5 MG tablet Take 1 tablet (5 mg total) by mouth daily. 90 tablet 3  . bimatoprost (LUMIGAN) 0.01 % SOLN Place 1 drop into both eyes at bedtime.     . brimonidine (ALPHAGAN) 0.15 % ophthalmic solution Place 1 drop into both eyes 2 (two) times daily.     . calcium carbonate (TUMS - DOSED IN MG ELEMENTAL CALCIUM) 500 MG chewable tablet Chew 1 tablet by mouth as needed for indigestion or heartburn.    . dorzolamide-timolol (COSOPT) 22.3-6.8 MG/ML ophthalmic solution Place 1 drop into both eyes 2 (two) times daily.     . ferrous sulfate 325 (65 FE) MG tablet Take 325 mg by mouth daily with breakfast.    . gabapentin (NEURONTIN) 300 MG capsule Take 300-600 mg by mouth See admin instructions. Take one capsule ($RemoveBefo'300mg'hBklrSTQBqI$ ) in  the morning and 2 capsules ($RemoveBef'600mg'lxZXhtkFtl$ ) at bedtime.    . gabapentin (NEURONTIN) 300 MG capsule Take 1 capsule (300 mg total) by mouth 3 (three) times daily. One in the morning and two at bedtime. 90 capsule 3  . hydrochlorothiazide (HYDRODIURIL) 25 MG tablet Take 25 mg by mouth daily.    Marland Kitchen HYDROcodone-acetaminophen (NORCO) 5-325 MG tablet Take 1 tablet by mouth every 6 (six) hours as needed for moderate pain. 30 tablet 0  . latanoprost (XALATAN) 0.005 % ophthalmic solution 1 drop at bedtime.    . memantine (NAMENDA) 10 MG tablet Take 10 mg by mouth 2 (two) times daily.    .  Multiple Vitamin (MULTIVITAMIN) capsule Take 1 capsule by mouth daily.    Marland Kitchen NAMENDA TITRATION PAK tablet pack Take by mouth as directed.    Marland Kitchen NARCAN 4 MG/0.1ML LIQD nasal spray kit SMARTSIG:1 Spray(s) Both Nares    . omega-3 acid ethyl esters (LOVAZA) 1 g capsule TAKE 1 CAPSULE BY MOUTH ONCE A DAY TO LOWER CHOLESTEROL    . Omega-3 Fatty Acids (FISH OIL) 1000 MG CAPS Take 1,000 mg by mouth daily.    . ondansetron (ZOFRAN) 4 MG tablet Take 1 tablet (4 mg total) by mouth every 8 (eight) hours as needed. 20 tablet 0  . Polyvinyl Alcohol-Povidone (REFRESH OP) Place 1 drop into both eyes 3 (three) times daily as needed (dry eyes).     . potassium chloride SA (K-DUR,KLOR-CON) 20 MEQ tablet Take 20 mEq by mouth daily.    . rivaroxaban (XARELTO) 10 MG TABS tablet Take 1 tablet (10 mg total) by mouth daily. 14 tablet 0  . rosuvastatin (CRESTOR) 10 MG tablet Take 1 tablet (10 mg total) by mouth daily. 90 tablet 3  . silver sulfADIAZINE (SILVADENE) 1 % cream Apply pea-sized amount to wound daily. 50 g 0  . triamcinolone cream (KENALOG) 0.1 % Apply 1 application topically 2 (two) times daily. 30 g 0   No current facility-administered medications on file prior to visit.    Allergies  Allergen Reactions  . Iodine Itching    Per pt when put on skin    Objective: Physical Exam  General: Alexis Ball is a pleasant 64 y.o. African American female, WD, WN in NAD. AAO x 3.   Vascular:  Neurovascular status unchanged b/l lower extremities. Capillary refill time to digits immediate b/l. Palpable pedal pulses b/l LE. Pedal hair sparse. Lower extremity skin temperature gradient within normal limits. No pain with calf compression b/l.  Dermatological:  Pedal skin with normal turgor, texture and tone bilaterally. No open wounds bilaterally. No interdigital macerations bilaterally. Toenails 1-5 b/l elongated, discolored, dystrophic, thickened, crumbly with subungual debris and tenderness to dorsal  palpation. Hyperkeratotic lesion(s) R hallux, submet head 1 right foot, submet head 5 left foot and submet head 5 right foot.  No erythema, no edema, no drainage, no fluctuance.   Well healed linear surgical scar dorsal 1st MPJ left foot  Musculoskeletal:  Normal muscle strength 5/5 to all lower extremity muscle groups bilaterally. Hammertoes noted to the L 2nd toe, L 3rd toe and L 4th toe.  Neurological:  Pt has subjective symptoms of neuropathy. Protective sensation intact 5/5 intact bilaterally with 10g monofilament b/l. Vibratory sensation intact b/l. Proprioception intact bilaterally. Babinski reflex negative b/l.  Assessment and Plan:  1. Onychomycosis   2. Callus   3. Diabetic peripheral neuropathy associated with type 2 diabetes mellitus ([place])    -Examined patient. -No new findings. No new  orders. -Continue diabetic foot care principles. -Toenails 1-5 b/l were debrided in length and girth with sterile nail nippers and dremel without iatrogenic bleeding.  -Callus(es) R hallux, submet head 1 right foot, submet head 5 left foot and submet head 5 right foot pared utilizing sterile scalpel blade without complication or incident. Total number debrided =4. -Patient to report any pedal injuries to medical professional immediately.  Return in about 3 months (around 07/01/2020) for diabetic foot care.  Marzetta Board, DPM

## 2020-04-15 ENCOUNTER — Other Ambulatory Visit: Payer: Self-pay

## 2020-04-15 ENCOUNTER — Ambulatory Visit (INDEPENDENT_AMBULATORY_CARE_PROVIDER_SITE_OTHER): Payer: Medicare Other | Admitting: Podiatry

## 2020-04-15 DIAGNOSIS — M7752 Other enthesopathy of left foot: Secondary | ICD-10-CM

## 2020-04-15 DIAGNOSIS — M205X2 Other deformities of toe(s) (acquired), left foot: Secondary | ICD-10-CM

## 2020-04-15 DIAGNOSIS — E1142 Type 2 diabetes mellitus with diabetic polyneuropathy: Secondary | ICD-10-CM

## 2020-04-15 DIAGNOSIS — M86672 Other chronic osteomyelitis, left ankle and foot: Secondary | ICD-10-CM | POA: Diagnosis not present

## 2020-04-15 MED ORDER — HYDROCODONE-ACETAMINOPHEN 5-325 MG PO TABS: 1.0000 | ORAL_TABLET | Freq: Four times a day (QID) | ORAL | 0 refills | Status: DC | PRN

## 2020-04-15 NOTE — Progress Notes (Signed)
  Subjective:  Patient ID: Alexis Ball, female    DOB: 10-15-1955,  MRN: [id number]  No chief complaint on file.  DOS: 12/05/19 Procedure: Removal of external fixator left with wound closure  65 y.o. female presents with the above complaint. Still having pain preventing activity. Voices her goals are to be able to comfortably due more ADLS like walk to the mailbox.  Objective:  Physical Exam: tenderness at the surgical site, local edema noted and calf supple, nontender. Stiffness noted of the 1st MPJ, reducible upon manual stretch. POP 5th metatarsal Incision: healed. No open wounds.  Assessment:   1. Hallux limitus of left foot   2. Diabetic peripheral neuropathy associated with type 2 diabetes mellitus (HCC)   3. Capsulitis of metatarsophalangeal (MTP) joint of left foot   4. Other chronic osteomyelitis of left foot (HCC)    Plan:  Patient was evaluated and treated and all questions answered.  Post-operative State -Still with residual pain. MRI read shows OM however I am concerned this is more inflammation due to the spacer rather than true infection. Will order bone biopsy for better eval. -Patient has failed all conservative therapy and wishes to proceed with surgical intervention. All risks, benefits, and alternatives discussed with patient. No guarantees given. Consent reviewed and signed by patient. -Planned procedures: bone biopsies left 1st metatarsal, proximal phalanx. -Identified risk factors: DM, DPN  Return for post-op care.

## 2020-04-18 ENCOUNTER — Telehealth: Payer: Self-pay

## 2020-04-18 NOTE — Telephone Encounter (Signed)
DOS 04/30/2020  BONE BIOPSY X 2 - 20220  [employer] MEDICARE EFFECTIVE DATE - 04/12/2020  PLAN DEDUCTIBLE - $0.00 OUT OF POCKET - $7550.00 W/ $7550.00 REMAINING  CO-INSURANCE 20% / Day OUTPATIENT SURGERY 20% / [place] $0 / Day OUTPATIENT SURGERY $0 / [place]  Notification or Prior Authorization is not required for the requested services  Decision ID #:[id number]

## 2020-04-30 ENCOUNTER — Other Ambulatory Visit: Payer: Self-pay | Admitting: Podiatry

## 2020-04-30 DIAGNOSIS — M86672 Other chronic osteomyelitis, left ankle and foot: Secondary | ICD-10-CM | POA: Diagnosis not present

## 2020-04-30 MED ORDER — OXYCODONE-ACETAMINOPHEN 5-325 MG PO TABS
1.0000 | ORAL_TABLET | ORAL | 0 refills | Status: DC | PRN
Start: 2020-04-30 — End: 2020-05-06

## 2020-04-30 MED ORDER — CEPHALEXIN 500 MG PO CAPS: 500.0000 mg | ORAL_CAPSULE | Freq: Two times a day (BID) | ORAL | 0 refills | Status: DC

## 2020-04-30 NOTE — Progress Notes (Signed)
Rx sent to pharmacy for outpatient surgery. °

## 2020-05-01 ENCOUNTER — Telehealth: Payer: Self-pay | Admitting: Podiatry

## 2020-05-01 NOTE — Telephone Encounter (Signed)
Called patient for post-op check. No answer; VM left.

## 2020-05-06 ENCOUNTER — Other Ambulatory Visit: Payer: Self-pay

## 2020-05-06 ENCOUNTER — Other Ambulatory Visit: Payer: Self-pay | Admitting: Podiatry

## 2020-05-06 ENCOUNTER — Ambulatory Visit (INDEPENDENT_AMBULATORY_CARE_PROVIDER_SITE_OTHER): Payer: Medicare Other

## 2020-05-06 ENCOUNTER — Ambulatory Visit (INDEPENDENT_AMBULATORY_CARE_PROVIDER_SITE_OTHER): Payer: Medicare Other | Admitting: Podiatry

## 2020-05-06 DIAGNOSIS — M205X2 Other deformities of toe(s) (acquired), left foot: Secondary | ICD-10-CM

## 2020-05-06 DIAGNOSIS — Z9889 Other specified postprocedural states: Secondary | ICD-10-CM

## 2020-05-06 MED ORDER — OXYCODONE-ACETAMINOPHEN 5-325 MG PO TABS: 1.0000 | ORAL_TABLET | ORAL | 0 refills | Status: DC | PRN

## 2020-05-06 NOTE — Progress Notes (Signed)
  Subjective:  Patient ID: Alexis Ball, female    DOB: 12-11-1955,  MRN: [id number]  Chief Complaint  Patient presents with  . Routine Post Op    POV #1 -pt states," it;s been hurting a lot; 6/10 sharp pains." - w/ nause but denies V/F/Ch   . Injury    Pt states she bumped Lt hallux x Sunday; pt states pain had increased after the injury (10/10) and with increase of drainage coming through the bandage -w/ more swelling Tx: codeine and elevation    DOS: 04/30/20 Procedure: Bone biopsy left foot  65 y.o. female presents with the above complaint. History confirmed with patient. States she bumped her toe and she had bleeding from her surgical site.   Objective:  Physical Exam: no tenderness at the surgical site, local edema noted and calf supple, nontender. Incision: healing well, no significant drainage, no dehiscence, no significant erythema  No images are attached to the encounter.  Radiographs: X-ray of the left foot: no fracture, dislocation, swelling or degenerative changes noted, no interval changes.  Assessment:   1. Hallux limitus of left foot   2. Post-operative state    Plan:  Patient was evaluated and treated and all questions answered.  Post-operative State -XR reviewed with patient -Sutures removed -Steri-strips applied to the incision -Ok to start showering at this time. Advised they cannot soak. -WBAT in normal shoegear  -Results from bone biopsy still pending unable to review today.  No follow-ups on file.

## 2020-05-07 ENCOUNTER — Encounter: Payer: Self-pay | Admitting: Podiatry

## 2020-05-12 ENCOUNTER — Encounter: Payer: Self-pay | Admitting: Podiatry

## 2020-05-20 ENCOUNTER — Encounter: Payer: Medicare Other | Admitting: Podiatry

## 2020-05-21 ENCOUNTER — Ambulatory Visit (INDEPENDENT_AMBULATORY_CARE_PROVIDER_SITE_OTHER): Payer: Medicare Other | Admitting: Podiatry

## 2020-05-21 ENCOUNTER — Other Ambulatory Visit: Payer: Self-pay

## 2020-05-21 ENCOUNTER — Encounter: Payer: Self-pay | Admitting: Podiatry

## 2020-05-21 DIAGNOSIS — Z9889 Other specified postprocedural states: Secondary | ICD-10-CM

## 2020-05-21 DIAGNOSIS — M205X2 Other deformities of toe(s) (acquired), left foot: Secondary | ICD-10-CM

## 2020-05-21 MED ORDER — OXYCODONE-ACETAMINOPHEN 10-325 MG PO TABS: 1.0000 | ORAL_TABLET | Freq: Three times a day (TID) | ORAL | 0 refills | Status: AC | PRN

## 2020-05-22 NOTE — Progress Notes (Signed)
Subjective:   Patient ID: Alexis Ball, female   DOB: 65 y.o.   MRN: [id number]   HPI Patient presents stating that the left foot feels pretty good and wondering whether an implant will be put in at 1 point in the future   ROS      Objective:  Physical Exam  Neurovascular status intact with negative Bevelyn Buckles' sign noted and well-healed surgical site left with no indications current pathology wound edges healed well     Assessment:  Doing well after having bone biopsy left     Plan:  As far as I can tell from previous reports everything looks good does not seem to have indications of osteomyelitic changes and patient will continue open toed shoes and be seen back by Dr. March Rummage in the next 2 weeks or earlier if any issues were to occur

## 2020-05-23 ENCOUNTER — Ambulatory Visit (INDEPENDENT_AMBULATORY_CARE_PROVIDER_SITE_OTHER): Payer: Medicare Other | Admitting: Podiatry

## 2020-05-23 DIAGNOSIS — Z5329 Procedure and treatment not carried out because of patient's decision for other reasons: Secondary | ICD-10-CM

## 2020-05-23 NOTE — Progress Notes (Signed)
No show for post-op appointment.

## 2020-05-30 ENCOUNTER — Other Ambulatory Visit: Payer: Self-pay

## 2020-05-30 ENCOUNTER — Ambulatory Visit: Payer: Self-pay | Admitting: Podiatry

## 2020-05-30 ENCOUNTER — Ambulatory Visit (INDEPENDENT_AMBULATORY_CARE_PROVIDER_SITE_OTHER): Payer: Medicare Other | Admitting: Podiatry

## 2020-05-30 DIAGNOSIS — M205X2 Other deformities of toe(s) (acquired), left foot: Secondary | ICD-10-CM

## 2020-05-30 DIAGNOSIS — M7752 Other enthesopathy of left foot: Secondary | ICD-10-CM

## 2020-05-30 DIAGNOSIS — M86672 Other chronic osteomyelitis, left ankle and foot: Secondary | ICD-10-CM

## 2020-05-30 DIAGNOSIS — E1142 Type 2 diabetes mellitus with diabetic polyneuropathy: Secondary | ICD-10-CM

## 2020-06-09 NOTE — Progress Notes (Signed)
  Subjective:  Patient ID: Alexis Ball, female    DOB: 1955/12/06,  MRN: [id number]  Chief Complaint  Patient presents with  . Routine Post Op     POV #2 DOS 04/30/2020 LT FOOT BONE BIOPSY OF 1ST METATARSAL & PROXIMAL PHALANX   DOS: 04/30/20 Procedure: Bone biopsy left foot  65 y.o. female presents with the above complaint. History confirmed with patient.  Still having pain would like to discuss next steps in surgical intervention  Objective:  Physical Exam: no tenderness at the surgical site, local edema noted and calf supple, nontender. Incision: Well-healed   Assessment:   1. Hallux limitus of left foot   2. Diabetic peripheral neuropathy associated with type 2 diabetes mellitus (HCC)   3. Capsulitis of metatarsophalangeal (MTP) joint of left foot   4. Other chronic osteomyelitis of left foot ([place])    Plan:  Patient was evaluated and treated and all questions answered.  Post-operative State -At this point all bone biopsies were negative.  We did discuss that we can proceed with planned revision with removal of the antibiotic spacer and repeat implant arthroplasty.  We did discuss performing a 1st injection Debrox at home today.  No residual bone infection prior to implantation of new hardware -Patient has failed all conservative therapy and wishes to proceed with surgical intervention. All risks, benefits, and alternatives discussed with patient. No guarantees given. Consent reviewed and signed by patient. -Planned procedures: Left foot removal of antibiotic spacer, bone biopsy, replacement of 1st metatarsophalangeal joint -ASA 3 - Patient with moderate systemic disease with functional limitations; Risk factors: Diabetes neuropathy -Post-op anticoagulation: chemoprophylaxis not indicated    No follow-ups on file.

## 2020-06-10 ENCOUNTER — Encounter: Payer: Medicare Other | Admitting: Podiatry

## 2020-06-23 ENCOUNTER — Telehealth: Payer: Self-pay | Admitting: Urology

## 2020-06-23 NOTE — Telephone Encounter (Addendum)
DOS: 07/02/20  BONE BIOPSY -- 20240 REMOVAL ANTIBIOTIC SPACER-- 20705 KELLER BUNION IMPLANT --- [address]  [place]  FOR CPT CODES 62563 AND 89373 Notification or Prior Authorization is not required for the requested services Decision ID #:[id number]  [employer] WEB SITE APPROVED CPT CODE 26203, AUTH #[id number]

## 2020-06-26 ENCOUNTER — Other Ambulatory Visit: Payer: Self-pay

## 2020-06-26 ENCOUNTER — Encounter (HOSPITAL_BASED_OUTPATIENT_CLINIC_OR_DEPARTMENT_OTHER): Payer: Self-pay | Admitting: Podiatry

## 2020-06-26 NOTE — Progress Notes (Signed)
Spoke w/ via phone for pre-op interview--- PT Lab needs dos---- no              Lab results------ current lab done 06-18-2020 at pcp office CBCdiff/ CMP results with chart COVID test ------ 06-28-2020 @ 1055 Arrive at ------- 0715 on 07-02-2020 NPO after MN NO Solid Food.  Clear liquids from MN until--- 0615 Med rec completed Medications to take morning of surgery ----- Gabapentin, Crestor, and eye drops as usual Diabetic medication ----- n/a Patient instructed to bring photo id and insurance card day of surgery Patient aware to have Driver (ride ) / caregiver    for 24 hours after surgery -- daughter-n-law, Alexis Ball Patient Special Instructions ----- n/a Pre-Op special Istructions ----- received pt's pcp H&P dated 06-18-2020 by Arthur Holms NP via fax from Dr March Rummage office, placed with chart Patient verbalized understanding of instructions that were given at this phone interview. Patient denies shortness of breath, chest pain, fever, cough at this phone interview.   Anesthesia :  HTN;  CVA without deficits;  ED visit for chest pain 12-07-2019 (2 days post op last surgery at [facility] on 12-05-2019)  Work-up negative and referred to cardiology.  Pt was seen by cardiology 01-07-2020 stated atypical chest pain, non-cardiac, echo done for murmur was normal.  Pt denies any cardiac / stroke s&s, no peripheral swelling, does get sob with exertion.   PCP:  Arthur Holms NP Cassell Clement 06-18-2020 w/ chart) Cardiologist :  Dr Shary Key (lov 01-07-2020 epic) EKG :  01-07-2020 epic Echo : 02-06-2020 epic Stress test: no Cardiac Cath :  10-16-2004  epic Activity level:  See above Sleep Study/ CPAP :  no Fasting Blood Sugar :      / Checks Blood Sugar -- times a day:  n/a Blood Thinner/ Instructions /Last Dose:  NO ASA / Instructions/ Last Dose : NO

## 2020-06-27 ENCOUNTER — Other Ambulatory Visit (HOSPITAL_COMMUNITY): Payer: Medicare Other

## 2020-06-28 ENCOUNTER — Other Ambulatory Visit (HOSPITAL_COMMUNITY)
Admission: RE | Admit: 2020-06-28 | Discharge: 2020-06-28 | Disposition: A | Discharge status: Home / Self Care | Payer: Medicare Other | Source: Ambulatory Visit | Attending: Podiatry | Admitting: Podiatry

## 2020-06-28 DIAGNOSIS — Z01812 Encounter for preprocedural laboratory examination: Secondary | ICD-10-CM | POA: Diagnosis present

## 2020-06-28 DIAGNOSIS — Z20822 Contact with and (suspected) exposure to covid-19: Secondary | ICD-10-CM | POA: Insufficient documentation

## 2020-06-28 LAB — SARS CORONAVIRUS 2 (TAT 6-24 HRS): SARS Coronavirus 2: NEGATIVE

## 2020-07-02 ENCOUNTER — Other Ambulatory Visit: Payer: Self-pay

## 2020-07-02 ENCOUNTER — Ambulatory Visit (HOSPITAL_BASED_OUTPATIENT_CLINIC_OR_DEPARTMENT_OTHER)
Admission: RE | Admit: 2020-07-02 | Discharge: 2020-07-02 | Disposition: A | Discharge status: Home / Self Care | Payer: Medicare Other | Attending: Podiatry | Admitting: Podiatry

## 2020-07-02 ENCOUNTER — Ambulatory Visit (HOSPITAL_BASED_OUTPATIENT_CLINIC_OR_DEPARTMENT_OTHER): Payer: Medicare Other | Admitting: Certified Registered Nurse Anesthetist

## 2020-07-02 ENCOUNTER — Encounter (HOSPITAL_BASED_OUTPATIENT_CLINIC_OR_DEPARTMENT_OTHER): Payer: Self-pay | Admitting: Podiatry

## 2020-07-02 ENCOUNTER — Encounter: Payer: Self-pay | Admitting: Podiatry

## 2020-07-02 ENCOUNTER — Encounter (HOSPITAL_BASED_OUTPATIENT_CLINIC_OR_DEPARTMENT_OTHER)
Admission: RE | Disposition: A | Discharge status: Home / Self Care | Payer: Self-pay | Source: Home / Self Care | Attending: Podiatry

## 2020-07-02 ENCOUNTER — Ambulatory Visit (HOSPITAL_COMMUNITY): Payer: Medicare Other

## 2020-07-02 DIAGNOSIS — M2022 Hallux rigidus, left foot: Secondary | ICD-10-CM | POA: Insufficient documentation

## 2020-07-02 DIAGNOSIS — Z9889 Other specified postprocedural states: Secondary | ICD-10-CM

## 2020-07-02 DIAGNOSIS — M205X2 Other deformities of toe(s) (acquired), left foot: Secondary | ICD-10-CM | POA: Diagnosis not present

## 2020-07-02 DIAGNOSIS — M7752 Other enthesopathy of left foot: Secondary | ICD-10-CM

## 2020-07-02 DIAGNOSIS — M86672 Other chronic osteomyelitis, left ankle and foot: Secondary | ICD-10-CM | POA: Diagnosis not present

## 2020-07-02 HISTORY — DX: Iron deficiency anemia, unspecified: D50.9

## 2020-07-02 HISTORY — DX: Vitamin D deficiency, unspecified: E55.9

## 2020-07-02 HISTORY — PX: IMPLANT OF A SILASTIC METATARSAL PHALANGE JOINT: SHX6452

## 2020-07-02 HISTORY — DX: Cardiac murmur, unspecified: R01.1

## 2020-07-02 HISTORY — DX: Presence of spectacles and contact lenses: Z97.3

## 2020-07-02 HISTORY — DX: Personal history of other venous thrombosis and embolism: Z86.718

## 2020-07-02 HISTORY — PX: BONE BIOPSY: SHX375

## 2020-07-02 HISTORY — DX: Dependence on other enabling machines and devices: Z99.89

## 2020-07-02 HISTORY — DX: Vascular dementia, unspecified severity, without behavioral disturbance, psychotic disturbance, mood disturbance, and anxiety: F01.50

## 2020-07-02 HISTORY — DX: Allergic rhinitis, unspecified: J30.9

## 2020-07-02 SURGERY — ARTHROPLASTY, JOINT, TOE, USING FLEXIBLE SILICONE ELASTOMER IMPLANT
Anesthesia: Monitor Anesthesia Care | Laterality: Left

## 2020-07-02 MED ORDER — LIDOCAINE 2% (20 MG/ML) 5 ML SYRINGE
INTRAMUSCULAR | Status: AC
  Filled 2020-07-02: qty 5

## 2020-07-02 MED ORDER — BUPIVACAINE HCL (PF) 0.5 % IJ SOLN
INTRAMUSCULAR | Status: DC | PRN
  Administered 2020-07-02: 10 mL

## 2020-07-02 MED ORDER — PHENYLEPHRINE 40 MCG/ML (10ML) SYRINGE FOR IV PUSH (FOR BLOOD PRESSURE SUPPORT)
PREFILLED_SYRINGE | INTRAVENOUS | Status: DC | PRN
  Administered 2020-07-02 (×2): 80 ug via INTRAVENOUS
  Administered 2020-07-02: 40 ug via INTRAVENOUS
  Administered 2020-07-02 (×2): 80 ug via INTRAVENOUS

## 2020-07-02 MED ORDER — CEFAZOLIN SODIUM-DEXTROSE 2-4 GM/100ML-% IV SOLN
INTRAVENOUS | Status: AC
  Filled 2020-07-02: qty 100

## 2020-07-02 MED ORDER — PROPOFOL 10 MG/ML IV BOLUS
INTRAVENOUS | Status: AC
  Filled 2020-07-02: qty 20

## 2020-07-02 MED ORDER — OXYCODONE-ACETAMINOPHEN 10-325 MG PO TABS: 1.0000 | ORAL_TABLET | ORAL | 0 refills | Status: DC | PRN

## 2020-07-02 MED ORDER — ONDANSETRON HCL 4 MG PO TABS: 4.0000 mg | ORAL_TABLET | Freq: Three times a day (TID) | ORAL | 0 refills | Status: DC | PRN

## 2020-07-02 MED ORDER — FENTANYL CITRATE (PF) 100 MCG/2ML IJ SOLN
25.0000 ug | INTRAMUSCULAR | Status: DC | PRN
  Administered 2020-07-02: 25 ug via INTRAVENOUS

## 2020-07-02 MED ORDER — MIDAZOLAM HCL 2 MG/2ML IJ SOLN
INTRAMUSCULAR | Status: AC
  Filled 2020-07-02: qty 2

## 2020-07-02 MED ORDER — PROPOFOL 500 MG/50ML IV EMUL
INTRAVENOUS | Status: AC
  Filled 2020-07-02: qty 50

## 2020-07-02 MED ORDER — PROMETHAZINE HCL 25 MG/ML IJ SOLN: 6.2500 mg | INTRAMUSCULAR | Status: DC | PRN

## 2020-07-02 MED ORDER — FENTANYL CITRATE (PF) 100 MCG/2ML IJ SOLN
INTRAMUSCULAR | Status: AC
  Filled 2020-07-02: qty 2

## 2020-07-02 MED ORDER — FENTANYL CITRATE (PF) 100 MCG/2ML IJ SOLN
INTRAMUSCULAR | Status: DC | PRN
  Administered 2020-07-02: 50 ug via INTRAVENOUS

## 2020-07-02 MED ORDER — PROPOFOL 10 MG/ML IV BOLUS
INTRAVENOUS | Status: DC | PRN
  Administered 2020-07-02: 20 mg via INTRAVENOUS

## 2020-07-02 MED ORDER — LACTATED RINGERS IV SOLN
INTRAVENOUS | Status: DC
  Administered 2020-07-02: 1000 mL via INTRAVENOUS

## 2020-07-02 MED ORDER — CEFAZOLIN SODIUM-DEXTROSE 2-4 GM/100ML-% IV SOLN
2.0000 g | INTRAVENOUS | Status: AC
  Administered 2020-07-02: 2 g via INTRAVENOUS

## 2020-07-02 MED ORDER — ONDANSETRON HCL 4 MG/2ML IJ SOLN
INTRAMUSCULAR | Status: DC | PRN
  Administered 2020-07-02: 4 mg via INTRAVENOUS

## 2020-07-02 MED ORDER — MIDAZOLAM HCL 5 MG/5ML IJ SOLN
INTRAMUSCULAR | Status: DC | PRN
  Administered 2020-07-02: 2 mg via INTRAVENOUS

## 2020-07-02 MED ORDER — PHENYLEPHRINE 40 MCG/ML (10ML) SYRINGE FOR IV PUSH (FOR BLOOD PRESSURE SUPPORT)
PREFILLED_SYRINGE | INTRAVENOUS | Status: AC
  Filled 2020-07-02: qty 10

## 2020-07-02 MED ORDER — PROPOFOL 500 MG/50ML IV EMUL
INTRAVENOUS | Status: DC | PRN
  Administered 2020-07-02: 200 ug/kg/min via INTRAVENOUS

## 2020-07-02 MED ORDER — ACETAMINOPHEN 10 MG/ML IV SOLN: 1000.0000 mg | Freq: Once | INTRAVENOUS | Status: DC | PRN

## 2020-07-02 MED ORDER — CEPHALEXIN 500 MG PO CAPS: 500.0000 mg | ORAL_CAPSULE | Freq: Two times a day (BID) | ORAL | 0 refills | Status: DC

## 2020-07-02 SURGICAL SUPPLY — 66 items
APL PRP STRL LF DISP 70% ISPRP (MISCELLANEOUS) ×1
BANDAGE ESMARK 6X9 LF (GAUZE/BANDAGES/DRESSINGS) ×1 IMPLANT
BLADE AVERAGE 25X9 (BLADE) ×1 IMPLANT
BLADE MINI RND TIP GREEN BEAV (BLADE) ×2 IMPLANT
BLADE OSC/SAG .038X5.5 CUT EDG (BLADE) IMPLANT
BLADE SURG 15 STRL LF DISP TIS (BLADE) ×1 IMPLANT
BLADE SURG 15 STRL SS (BLADE) ×2
BNDG CMPR 9X6 STRL LF SNTH (GAUZE/BANDAGES/DRESSINGS) ×1
BNDG ELASTIC 4X5.8 VLCR STR LF (GAUZE/BANDAGES/DRESSINGS) ×2 IMPLANT
BNDG ESMARK 6X9 LF (GAUZE/BANDAGES/DRESSINGS) ×2
BNDG GAUZE ELAST 4 BULKY (GAUZE/BANDAGES/DRESSINGS) ×2 IMPLANT
BUR OVAL CARBIDE 4.0 (BURR) ×1 IMPLANT
CHLORAPREP W/TINT 26 (MISCELLANEOUS) ×2 IMPLANT
COVER BACK TABLE 60X90IN (DRAPES) ×2 IMPLANT
COVER SURGICAL LIGHT HANDLE (MISCELLANEOUS) ×1 IMPLANT
COVER WAND RF STERILE (DRAPES) ×2 IMPLANT
CUFF TOURN SGL QUICK 18X4 (TOURNIQUET CUFF) IMPLANT
CUFF TOURN SGL QUICK 24 (TOURNIQUET CUFF)
CUFF TOURN SGL QUICK 34 (TOURNIQUET CUFF)
CUFF TRNQT CYL 24X4X16.5-23 (TOURNIQUET CUFF) IMPLANT
CUFF TRNQT CYL 34X4.125X (TOURNIQUET CUFF) IMPLANT
DECANTER SPIKE VIAL GLASS SM (MISCELLANEOUS) IMPLANT
DRAPE 3/4 80X56 (DRAPES) ×2 IMPLANT
DRAPE C-ARM 35X43 STRL (DRAPES) ×2 IMPLANT
DRAPE EXTREMITY T 121X128X90 (DISPOSABLE) ×2 IMPLANT
DRAPE U-SHAPE 47X51 STRL (DRAPES) ×2 IMPLANT
DRSG EMULSION OIL 3X3 NADH (GAUZE/BANDAGES/DRESSINGS) ×1 IMPLANT
ELECT REM PT RETURN 9FT ADLT (ELECTROSURGICAL) ×2
ELECTRODE REM PT RTRN 9FT ADLT (ELECTROSURGICAL) ×1 IMPLANT
GAUZE SPONGE 4X4 12PLY STRL (GAUZE/BANDAGES/DRESSINGS) ×2 IMPLANT
GAUZE XEROFORM 1X8 LF (GAUZE/BANDAGES/DRESSINGS) ×2 IMPLANT
GLOVE SRG 8 PF TXTR STRL LF DI (GLOVE) ×1 IMPLANT
GLOVE SURG ENC MOIS LTX SZ7.5 (GLOVE) ×2 IMPLANT
GLOVE SURG UNDER POLY LF SZ8 (GLOVE) ×2
GOWN STRL REUS W/TWL XL LVL3 (GOWN DISPOSABLE) ×2 IMPLANT
IMPL TOE INTEGRA CGT SIZE40 (Toe) IMPLANT
IMPLANT TOE INTEGRA CGT SIZE40 (Toe) ×2 IMPLANT
K-WIRE SURGICAL 1.6X102 (WIRE) IMPLANT
KIT TURNOVER CYSTO (KITS) ×2 IMPLANT
MATRIX SURGICAL PSMX 5X5CM (Tissue) ×1 IMPLANT
NDL HYPO 25X1 1.5 SAFETY (NEEDLE) IMPLANT
NEEDLE HYPO 22GX1.5 SAFETY (NEEDLE) ×1 IMPLANT
NEEDLE HYPO 25X1 1.5 SAFETY (NEEDLE) IMPLANT
NS IRRIG 1000ML POUR BTL (IV SOLUTION) ×2 IMPLANT
PACK BASIN DAY SURGERY FS (CUSTOM PROCEDURE TRAY) ×2 IMPLANT
PAD CAST 4YDX4 CTTN HI CHSV (CAST SUPPLIES) IMPLANT
PADDING CAST COTTON 4X4 STRL (CAST SUPPLIES) ×2
PENCIL SMOKE EVACUATOR (MISCELLANEOUS) ×2 IMPLANT
PIN CAPS ORTHO GREEN .062 (PIN) IMPLANT
STAPLER VISISTAT 35W (STAPLE) IMPLANT
SUCTION FRAZIER HANDLE 10FR (MISCELLANEOUS) ×2
SUCTION TUBE FRAZIER 10FR DISP (MISCELLANEOUS) ×1 IMPLANT
SUT ETHILON 4 0 PS 2 18 (SUTURE) ×2 IMPLANT
SUT MNCRL AB 3-0 PS2 18 (SUTURE) ×2 IMPLANT
SUT MNCRL AB 4-0 PS2 18 (SUTURE) ×2 IMPLANT
SUT VIC AB 0 CT1 36 (SUTURE) ×1 IMPLANT
SUT VIC AB 2-0 SH 27 (SUTURE) ×2
SUT VIC AB 2-0 SH 27XBRD (SUTURE) ×1 IMPLANT
SUT VIC AB 3-0 PS2 18 (SUTURE) ×2
SUT VIC AB 3-0 PS2 18XBRD (SUTURE) IMPLANT
SYR BULB EAR ULCER 3OZ GRN STR (SYRINGE) ×2 IMPLANT
SYR CONTROL 10ML LL (SYRINGE) ×2 IMPLANT
TRAY DSU PREP LF (CUSTOM PROCEDURE TRAY) IMPLANT
TUBE CONNECTING 12X1/4 (SUCTIONS) ×3 IMPLANT
UNDERPAD 30X36 HEAVY ABSORB (UNDERPADS AND DIAPERS) ×2 IMPLANT
YANKAUER SUCT BULB TIP NO VENT (SUCTIONS) IMPLANT

## 2020-07-02 NOTE — Anesthesia Preprocedure Evaluation (Signed)
Anesthesia Evaluation  Patient identified by MRN, date of birth, ID band Patient awake    Reviewed: Allergy & Precautions, NPO status , Patient's Chart, lab work & pertinent test results  Airway Mallampati: II  TM Distance: >3 FB Neck ROM: Full    Dental  (+) Teeth Intact   Pulmonary    Pulmonary exam normal        Cardiovascular hypertension, Pt. on medications + DVT   Rhythm:Regular Rate:Normal     Neuro/Psych  Headaches, Dementia CVA (2007), No Residual Symptoms    GI/Hepatic Neg liver ROS, GERD  ,  Endo/Other  negative endocrine ROS  Renal/GU negative Renal ROS  negative genitourinary   Musculoskeletal  (+) Arthritis , Osteoarthritis,  Hallux rigidus left foot   Abdominal (+)  Abdomen: soft. Bowel sounds: normal.  Peds  Hematology  (+) anemia ,   Anesthesia Other Findings   Reproductive/Obstetrics                             Anesthesia Physical Anesthesia Plan  ASA: III  Anesthesia Plan: MAC   Post-op Pain Management:    Induction: Intravenous  PONV Risk Score and Plan: 2 and Ondansetron, Dexamethasone, Propofol infusion, Midazolam and Treatment may vary due to age or medical condition  Airway Management Planned: Natural Airway and Nasal Cannula  Additional Equipment: None  Intra-op Plan:   Post-operative Plan:   Informed Consent: I have reviewed the patients History and Physical, chart, labs and discussed the procedure including the risks, benefits and alternatives for the proposed anesthesia with the patient or authorized representative who has indicated his/her understanding and acceptance.     Dental advisory given  Plan Discussed with: CRNA  Anesthesia Plan Comments: (   )        Anesthesia Quick Evaluation

## 2020-07-02 NOTE — Brief Op Note (Signed)
07/02/2020  11:06 AM  PATIENT:  Alexis Ball  66 y.o. female  PRE-OPERATIVE DIAGNOSIS:  Hallux rigidus, presence of antibiotic spacer  POST-OPERATIVE DIAGNOSIS:  Hallux rigidus, presence of antibiotic spacer  PROCEDURE:  Procedure(s): IMPLANT OF A SILASTIC METATARSAL PHALANGE JOINT WITH REMOVAL OF ANTIBIOTIC SPACER;CAPSULAR REPAIR WITH TISSUE SUBSTITUTE (Left) BONE BIOPSY - FROZEN SECTION (Left)  SURGEON:  Surgeon(s) and Role:    * Evelina Bucy, DPM - Primary  PHYSICIAN ASSISTANT:   ASSISTANTS: none   ANESTHESIA:   local and MAC  EBL:  2 mL   BLOOD ADMINISTERED:none  DRAINS: none   LOCAL MEDICATIONS USED:  MARCAINE    and Amount: 10 ml  SPECIMEN:   ID Type Source Tests Collected by Time Destination  1 : LEFT FOOT TISSUE (LOOKING FOR NEUTROFILS) Tissue PATH Soft tissue SURGICAL PATHOLOGY Evelina Bucy, DPM 07/02/2020 0959       DISPOSITION OF SPECIMEN:  PATHOLOGY  COUNTS:  YES  TOURNIQUET:  * Missing tourniquet times found for documented tourniquets in log: [id number] *  DICTATION: .Viviann Spare Dictation  PLAN OF CARE: Discharge to home after PACU  PATIENT DISPOSITION:  PACU - hemodynamically stable.   Delay start of Pharmacological VTE agent (>24hrs) due to surgical blood loss or risk of bleeding: not applicable

## 2020-07-02 NOTE — Op Note (Incomplete)
  Patient Name: Alexis Ball DOB: [birth year]  MRN: [id number]   Date of Surgery: 07/02/2020  Surgeon: Dr. Hardie Pulley, DPM Assistants: none  Pre-operative Diagnosis:  * No Diagnosis Codes entered * Post-operative Diagnosis:  * No Diagnosis Codes entered * Procedures:  1) *** Pathology/Specimens: ID Type Source Tests Collected by Time Destination  1 : LEFT FOOT TISSUE (LOOKING FOR NEUTROFILS) Tissue PATH Soft tissue SURGICAL PATHOLOGY Evelina Bucy, DPM 07/02/2020 1962    Anesthesia: *** Hemostasis: * Missing tourniquet times found for documented tourniquets in log: [id number] * Estimated Blood Loss: 2 mL Materials:  Implant Name Type Inv. Item Serial No. Manufacturer Lot No. LRB No. Used Action  ACELL GENTRIX   [id number] ACELL [id number] Left 1 Implanted  [employer]TE[employer]RA CGT IMPLANT     [id number] Left 1 Implanted   Medications: *** Complications: ***  Indications for Procedure:  This is a 65 y.o. female with a ***   Procedure in Detail: Patient was identified in pre-operative holding area. Formal consent was signed and the *** lower extremity was marked. Patient was brought back to the operating room. Anesthesia was induced. The extremity was prepped and draped in the usual sterile fashion. Timeout was taken to confirm patient name, laterality, and procedure prior to incision.   Attention was then directed to the ***  The foot was then dressed with ***. Patient tolerated the procedure well.     Dr. Marland Kitchen was scrubbed and present for the *** procedure, and assisted in ***.  Disposition: Following a period of post-operative monitoring, patient will be transferred ***.

## 2020-07-02 NOTE — Transfer of Care (Signed)
Immediate Anesthesia Transfer of Care Note  Patient: Alexis Ball  Procedure(s) Performed: IMPLANT OF A SILASTIC METATARSAL PHALANGE JOINT WITH REMOVAL OF ANTIBIOTIC SPACER;CAPSULAR REPAIR WITH TISSUE SUBSTITUTE (Left ) BONE BIOPSY - FROZEN SECTION (Left )  Patient Location: PACU  Anesthesia Type:MAC  Level of Consciousness: awake, alert , oriented and patient cooperative  Airway & Oxygen Therapy: Patient Spontanous Breathing  Post-op Assessment: Report given to RN and Post -op Vital signs reviewed and stable  Post vital signs: Reviewed and stable  Last Vitals:  Vitals Value Taken Time  BP 138/77 07/02/20 1110  Temp    Pulse 55 07/02/20 1112  Resp 13 07/02/20 1112  SpO2 98 % 07/02/20 1112  Vitals shown include unvalidated device data.  Last Pain:  Vitals:   07/02/20 0749  TempSrc: Oral  PainSc: 7       Patients Stated Pain Goal: 7 (35/70/17 7939)  Complications: No complications documented.

## 2020-07-02 NOTE — H&P (Signed)
Anesthesia H&P Update: History and Physical Exam reviewed; patient is OK for planned anesthetic and procedure. ? ?

## 2020-07-02 NOTE — Discharge Instructions (Signed)
After Surgery Instructions   1) If you are recuperating from surgery anywhere other than home, please be sure to leave [place] the number where you can be reached.  2) Go directly home and rest.  3) Keep the operated foot(feet) elevated six inches above the hip when sitting or lying down. This will help control swelling and pain.  4) Support the elevated foot and leg with pillows. DO NOT PLACE PILLOWS UNDER THE KNEE.  5) DO NOT REMOVE or get your bandages WET, unless you were given different instructions by your doctor to do so. This increases the risk of infection.  6) Wear your surgical shoe or surgical boot at all times when you are up on your feet.  7) A limited amount of pain and swelling may occur. The skin may take on a bruised appearance. DO NOT BE ALARMED, THIS IS NORMAL.  8) For slight pain and swelling, apply an ice pack directly over the bandages for 15 minutes only out of each hour of the day. Continue until seen in the office for your first post op visit. DO NOT APPLY ANY FORM OF HEAT TO THE AREA.  9) Have prescriptions filled immediately and take as directed.  10) Drink lots of liquids, water and juice to stay hydrated.  11) CALL IMMEDIATELY IF:  *Bleeding continues until the following day of surgery  *Pain increases and/or does not respond to medication  *Bandages or cast appears to tight  *If your bandage gets wet  *Trip, fall or stump your surgical foot  *If your temperature goes above 101  *If you have ANY questions at all  12) You are expected to be weightbearing after your surgery.   If you need to reach the nurse for any reason, please call: [place]: [phone] [place]: [phone] [place]: [phone]   Orthopedic Hardware Removal, Care After This sheet gives you information about how to care for yourself after your procedure. Your health care provider may also give you more specific instructions. If you have problems or  questions, contact your health care provider. What can I expect after the procedure? After the procedure, it is common to have:  Soreness or pain.  Some swelling in the area where the hardware was removed.  A small amount of blood or clear fluid coming from your incision. Follow these instructions at home: If you have a cast:  Do not stick anything inside the cast to scratch your skin. Doing that increases your risk of infection.  Check the skin around the cast every day. Tell your health care provider about any concerns.  You may put lotion on dry skin around the edges of the cast. Do not put lotion on the skin underneath the cast.  Keep the cast clean and dry. If you have a splint or boot:  Wear the splint or boot as told by your health care provider. Remove it only as told by your health care provider.  Loosen the splint or boot if your fingers or toes tingle, become numb, or turn cold and blue.  Keep the splint or boot clean and dry. Bathing  Do not take baths, swim, or use a hot tub until your health care provider approves. Ask your health care provider if you may take showers. You may only be allowed to take sponge baths.  Keep the bandage (dressing) dry until your health care provider says it can be removed.  If your cast, splint, or boot is not waterproof: ? Do  not let it get wet. ? Cover it with a watertight covering when you take a bath or a shower. Incision care  Follow instructions from your health care provider about how to take care of your incision. Make sure you: ? Wash your hands with soap and water before you change your dressing. If soap and water are not available, use hand sanitizer. ? Change your dressing as told by your health care provider. ? Leave stitches (sutures), skin glue, or adhesive strips in place. These skin closures may need to stay in place for 2 weeks or longer. If adhesive strip edges start to loosen and curl up, you may trim the loose  edges. Do not remove adhesive strips completely unless your health care provider tells you to do that.  Check your incision area every day for signs of infection. Check for: ? Redness. ? More swelling or pain. ? More fluid or blood. ? Warmth. ? Pus or a bad smell.   Managing pain, stiffness, and swelling  If directed, put ice on the affected area: ? If you have a removable splint or boot, remove it as told by your health care provider. ? Put ice in a plastic bag. ? Place a towel between your skin and the bag. ? Leave the ice on for 20 minutes, 2-3 times a day.  Move your fingers or toes often to avoid stiffness and to lessen swelling.  Raise (elevate) the injured area above the level of your heart while you are sitting or lying down.   Driving  Do not drive or use heavy machinery while taking prescription pain medicine.  Do not drive for 24 hours if you were given a medicine to help you relax (sedative) during your procedure.  Ask your health care provider when it is safe to drive if you have a cast, splint, or boot on the affected limb. Activity  Ask your health care provider what activities are safe for you during recovery, and ask what activities you need to avoid.  Do not use the injured limb to support your body weight until your health care provider says that you can.  Do not play contact sports until your health care provider approves.  Do exercises as told by your health care provider.  Avoid sitting for a long time without moving. Get up and move around at least every few hours. This will help prevent blood clots. General instructions  Do not put pressure on any part of the cast or splint until it is fully hardened. This may take several hours.  If you are taking prescription pain medicine, take actions to prevent or treat constipation. Your health care provider may recommend that you: ? Drink enough fluid to keep your urine pale yellow. ? Eat foods that are high  in fiber, such as fresh fruits and vegetables, whole grains, and beans. ? Limit foods that are high in fat and processed sugars, such as fried or sweet foods. ? Take an over-the-counter or prescription medicine for constipation.  Do not use any products that contain nicotine or tobacco, such as cigarettes and e-cigarettes. These can delay bone healing after surgery. If you need help quitting, ask your health care provider.  Take over-the-counter and prescription medicines only as told by your health care provider.  Keep all follow-up visits as told by your health care provider. This is important. Contact a health care provider if:  You have lasting pain.  You have redness around your incision.  You have  more swelling or pain around your incision.  You have more fluid or blood coming from your incision.  Your incision feels warm to the touch.  You have pus or a bad smell coming from your incision.  You are unable to do exercises or physical activity as told by your health care provider. Get help right away if:  You have difficulty breathing.  You have chest pain.  You have severe pain.  You have a fever or chills.  You have numbness for more than 24 hours in the area where the hardware was removed. Summary  After the procedure, it is common to have some pain and swelling in the area where the hardware was removed.  Follow instructions from your health care provider about how to take care of your incision.  Return to your normal activities as told by your health care provider. Ask your health care provider what activities are safe for you. This information is not intended to replace advice given to you by your health care provider. Make sure you discuss any questions you have with your health care provider. Document Revised: 05/25/2018 Document Reviewed: 04/21/2017 Elsevier Patient Education  2021 [place]  Instructions  Activity: Get plenty of rest for the remainder of the day. A responsible individual must stay with you for 24 hours following the procedure.  For the next 24 hours, DO NOT: -Drive a car -Paediatric nurse -Drink alcoholic beverages -Take any medication unless instructed by your physician -Make any legal decisions or sign important papers.  Meals: Start with liquid foods such as gelatin or soup. Progress to regular foods as tolerated. Avoid greasy, spicy, heavy foods. If nausea and/or vomiting occur, drink only clear liquids until the nausea and/or vomiting subsides. Call your physician if vomiting continues.  Special Instructions/Symptoms: Your throat may feel dry or sore from the anesthesia or the breathing tube placed in your throat during surgery. If this causes discomfort, gargle with warm salt water. The discomfort should disappear within 24 hours.

## 2020-07-02 NOTE — Anesthesia Postprocedure Evaluation (Signed)
Anesthesia Post Note  Patient: Alexis Ball  Procedure(s) Performed: IMPLANT OF A SILASTIC METATARSAL PHALANGE JOINT WITH REMOVAL OF ANTIBIOTIC SPACER;CAPSULAR REPAIR WITH TISSUE SUBSTITUTE (Left ) BONE BIOPSY - FROZEN SECTION (Left )     Patient location during evaluation: PACU Anesthesia Type: MAC Level of consciousness: awake and alert Pain management: pain level controlled Vital Signs Assessment: post-procedure vital signs reviewed and stable Respiratory status: spontaneous breathing, nonlabored ventilation, respiratory function stable and patient connected to nasal cannula oxygen Cardiovascular status: stable and blood pressure returned to baseline Postop Assessment: no apparent nausea or vomiting Anesthetic complications: no   No complications documented.  Last Vitals:  Vitals:   07/02/20 1200 07/02/20 1222  BP: (!) 172/90 (!) 155/96  Pulse: (!) 50 (!) 59  Resp: (!) 22 20  Temp:    SpO2: 97% 96%    Last Pain:  Vitals:   07/02/20 1222  TempSrc:   PainSc: 0-No pain                 Belenda Cruise P Natonya Finstad

## 2020-07-02 NOTE — H&P (Signed)
  Subjective:  Patient ID: Alexis Ball, female    DOB: 06/08/1955,  MRN: [id number]  No chief complaint on file.  DOS: 04/30/20 Procedure: Bone biopsy left foot  65 y.o. female presents today for elective surgery. She denies update to her medical history since last seen.  Objective:  Physical Exam: Left foot warm and well perfused Well healed incisions left Decreased ROM left 1st MPJ No warmth, erythema, signs of infection noted.  Assessment:   1. Hallux limitus of left foot    Plan:  Patient was evaluated and treated and all questions answered.  Left foot hallux limitus with retained spacer -Patient has failed all conservative therapy and wishes to proceed with surgical intervention. All risks, benefits, and alternatives discussed with patient. No guarantees given. Consent reviewed and signed by patient. -Planned procedures: Left foot removal of antibiotic spacer, bone biopsy, replacement of 1st metatarsophalangeal joint -ASA 3 - Patient with moderate systemic disease with functional limitations; Risk factors: Diabetes neuropathy -Post-op anticoagulation: chemoprophylaxis not indicated    No follow-ups on file.

## 2020-07-03 ENCOUNTER — Encounter: Payer: Self-pay | Admitting: Podiatry

## 2020-07-03 ENCOUNTER — Encounter (HOSPITAL_BASED_OUTPATIENT_CLINIC_OR_DEPARTMENT_OTHER): Payer: Self-pay | Admitting: Podiatry

## 2020-07-03 LAB — SURGICAL PATHOLOGY

## 2020-07-03 NOTE — Progress Notes (Signed)
Instructed patient to call MD.  She is rating her pain a 10 with pain medication.

## 2020-07-04 ENCOUNTER — Ambulatory Visit: Payer: Medicare Other | Admitting: Podiatry

## 2020-07-04 ENCOUNTER — Other Ambulatory Visit: Payer: Self-pay | Admitting: Podiatry

## 2020-07-04 MED ORDER — IBUPROFEN 600 MG PO TABS: 600.0000 mg | ORAL_TABLET | Freq: Three times a day (TID) | ORAL | 2 refills | Status: DC | PRN

## 2020-07-08 ENCOUNTER — Ambulatory Visit (INDEPENDENT_AMBULATORY_CARE_PROVIDER_SITE_OTHER): Payer: Medicare Other | Admitting: Podiatry

## 2020-07-08 ENCOUNTER — Other Ambulatory Visit: Payer: Self-pay

## 2020-07-08 DIAGNOSIS — E1142 Type 2 diabetes mellitus with diabetic polyneuropathy: Secondary | ICD-10-CM | POA: Diagnosis not present

## 2020-07-08 DIAGNOSIS — Z9889 Other specified postprocedural states: Secondary | ICD-10-CM

## 2020-07-08 DIAGNOSIS — B351 Tinea unguium: Secondary | ICD-10-CM

## 2020-07-08 DIAGNOSIS — M205X2 Other deformities of toe(s) (acquired), left foot: Secondary | ICD-10-CM

## 2020-07-08 DIAGNOSIS — E1169 Type 2 diabetes mellitus with other specified complication: Secondary | ICD-10-CM

## 2020-07-08 MED ORDER — OXYCODONE-ACETAMINOPHEN 10-325 MG PO TABS
1.0000 | ORAL_TABLET | ORAL | 0 refills | Status: DC | PRN
Start: 2020-07-08 — End: 2020-07-23

## 2020-07-10 NOTE — Progress Notes (Signed)
  Subjective:  Patient ID: Alexis Ball, female    DOB: December 04, 1955,  MRN: [id number]  Chief Complaint  Patient presents with  . Routine Post Op    POV #1 DOS 07/02/2020 LT FOOT REMOVAL OF ANTIBIOTIC SPACER, BONE BIOPSY 1ST MPJ IMPLANT    DOS: 07/02/20 Procedure: Left foot removal of hardware, bone biopsy, first metatarsophalangeal joint replacement with implant  65 y.o. female presents with the above complaint. History confirmed with patient.  Doing well still having a little bit of pain that is controlled with medications.  Denies postop issues  Requesting care of her nails today  Objective:  Physical Exam: tenderness at the surgical site, local edema noted and calf supple, nontender.Good range of motion noted at the first metatarsophalangeal joint Incision: healing well, no significant drainage, no dehiscence, no significant erythema  Elongated dystrophic nails Assessment:   1. Onychomycosis of multiple toenails with type 2 diabetes mellitus and peripheral neuropathy (HCC)   2. Hallux limitus of left foot   3. Post-operative state     Plan:  Patient was evaluated and treated and all questions answered.  Post-operative State -Dressing applied consisting of sterile gauze, kerlix and ACE bandage -WBAT in Surgical shoe -Pain medication refilled   Onychomycosis -Diabetic criteria for routine foot care  Procedure: Nail Debridement Type of Debridement: manual, sharp debridement. Instrumentation: Nail nipper, rotary burr. Number of Nails: 10     Return in about 2 weeks (around 07/22/2020) for Post-Op (No XRs).

## 2020-07-14 ENCOUNTER — Telehealth: Payer: Self-pay

## 2020-07-14 NOTE — Telephone Encounter (Signed)
Pt is scheduled at to see you in 815am tomorrow

## 2020-07-14 NOTE — Telephone Encounter (Signed)
Pt states that her foot incision is now open. It has pus and draining a ton. Pt is going to go to the hospital.

## 2020-07-14 NOTE — Telephone Encounter (Signed)
Called patient and discussed coming into the office today for eval prior to going to the hospital. States she had ants bite her foot and she has part of the incision gapped and has an infection.

## 2020-07-15 ENCOUNTER — Ambulatory Visit: Payer: Medicare Other | Admitting: Podiatry

## 2020-07-22 ENCOUNTER — Other Ambulatory Visit: Payer: Self-pay

## 2020-07-22 ENCOUNTER — Ambulatory Visit (INDEPENDENT_AMBULATORY_CARE_PROVIDER_SITE_OTHER): Payer: Medicare Other | Admitting: Podiatry

## 2020-07-22 DIAGNOSIS — Z9889 Other specified postprocedural states: Secondary | ICD-10-CM

## 2020-07-22 DIAGNOSIS — M205X2 Other deformities of toe(s) (acquired), left foot: Secondary | ICD-10-CM

## 2020-07-23 ENCOUNTER — Telehealth: Payer: Self-pay | Admitting: *Deleted

## 2020-07-23 MED ORDER — OXYCODONE-ACETAMINOPHEN 10-325 MG PO TABS: 1.0000 | ORAL_TABLET | ORAL | 0 refills | Status: DC | PRN

## 2020-07-23 NOTE — Telephone Encounter (Signed)
Patient is calling for the status of her pain medication that was supposed to be sent to pharmacy 1 day ago. Please advise.

## 2020-07-23 NOTE — Telephone Encounter (Signed)
Should be there now please apologize for the delay not sure what happened I remember putting it in

## 2020-07-23 NOTE — Telephone Encounter (Signed)
Called and left Vmessage that the prescription had been sent to pharmacy on file and apologized for the delay.

## 2020-07-23 NOTE — Progress Notes (Signed)
  Subjective:  Patient ID: Alexis Ball, female    DOB: 11/02/1955,  MRN: [id number]  Chief Complaint  Patient presents with  . Routine Post Op    POV #2 DOS 07/02/2020 LT FOOT REMOVAL OF ANTIBIOTIC SPACER, BONE BIOPSY 1ST MPJ IMPLANT. Pt complains of pain and edema. No N/V, fever or chills.     DOS: 07/02/20 Procedure: Left foot removal of hardware, bone biopsy, first metatarsophalangeal joint replacement with implant  65 y.o. female presents with the above complaint. History confirmed with patient.  Was concerned about infection but thinks that her foot is doing much better now  Objective:  Physical Exam: tenderness at the surgical site, local edema noted and calf supple, nontender.Good range of motion noted at the first metatarsophalangeal joint Incision: healing well, no significant drainage, no dehiscence, no significant erythema Assessment:   1. Post-operative state   2. Hallux limitus of left foot     Plan:  Patient was evaluated and treated and all questions answered.  Post-operative State -Sutures removed -Ok to start showering at this time. Advised they cannot soak. -Pain medication refilled - Okay to start transitioning into comfortable shoe gear as tolerated  -Advised to continue to work on range of motion exercises .     No follow-ups on file.

## 2020-08-04 NOTE — Op Note (Signed)
  Patient Name: Alexis Ball DOB: Oct 17, 1955  MRN: [id number]   Date of Surgery: 07/02/2020  Surgeon: Dr. Hardie Pulley, DPM Assistants: none  Pre-operative Diagnosis:  Hallux rigidus, resolved osteomyelitis Post-operative Diagnosis:  Same Procedures:  1) Bone Biopsy left 1st metatarsal for fresh frozen section  2) Removal of antibiotic spacer  3) Left 1st MPJ replacement.  4) Capsule augmentation with allograft tissue Pathology/Specimens: ID Type Source Tests Collected by Time Destination  1 : LEFT FOOT TISSUE (LOOKING FOR NEUTROFILS) Tissue PATH Soft tissue SURGICAL PATHOLOGY Evelina Bucy, DPM 07/02/2020 8144    Anesthesia: MAC/local Hemostasis: Ankle TQ. Estimated Blood Loss: 2 mL Materials:  Implant Name Type Inv. Item Serial No. Manufacturer Lot No. LRB No. Used Action  MATRIX SURGICAL PSMX 5X5CM - [id number] Tissue MATRIX SURGICAL PSMX 5X5CM [id number] ACELL [id number] Left 1 Implanted  IMPLANT TOE INTEGRA CGT SIZE40 - [id number] Toe IMPLANT TOE INTEGRA CGT SIZE40  [employer] [id number] Left 1 Implanted   Medications: none Complications: none  Indications for Procedure:  This is a 65 y.o. female with a complicated history of multiple surgeries due to infection complicating her primary surgery performed by another provider.  She has done well postoperatively from an infection standpoint but has had continued pain.  It was discussed she would benefit from removal of her antibiotic spacer and repeat implant arthroplasty.  She has had bone biopsies performed that did not show signs of continued infection.  All risk benefits alternatives of surgery discussed with the patient no guarantees were given.   Procedure in Detail: Patient was identified in pre-operative holding area. Formal consent was signed and the left lower extremity was marked. Patient was brought back to the operating room. Anesthesia was induced. The extremity was prepped and draped in the usual sterile  fashion. Timeout was taken to confirm patient name, laterality, and procedure prior to incision.   Attention was then directed to the left foot.  A linear incision was made overlying the previous area of the patient's surgery over the first metatarsal phalangeal joint.  Dissection was carried down to level of the joint capsule.  The capsule appeared attenuated given multiple history of surgeries and the antibiotic spacer was noted.  The antibiotic spacer was freed from the surrounding bone and explanted.  A portion of the metatarsal and phalanx bone was sent for fresh frozen section.  After waiting several minutes the pathologist confirmed no neutrophils. During this time the wound was copiously irrigated with normal saline.  At this point it was decided we could continue with the planned procedure.  The mid first metatarsal and proximal phalanx were freshened with sagittal saw for new implant arthroplasty.  The canals of the proximal phalanx and metatarsal were burred and prepared for the Silastic implant.  A size 40 implant was sized and found to be seated well with good range of motion and lack of extrusion upon range of motion.  The wound was irrigated and the permanent implant was placed.  At this point given the capsular weakness ACell surgical matrix was applied over the dorsal capsule and sutured into the remaining capsule.  The wound was then copiously irrigated it was closed in layers with 3-0 Vicryl and 4-0 nylon.  The foot was then dressed with Xeroform 4 x 4's Kerlix and Ace bandage.  Disposition: Following a period of post-operative monitoring, patient will be transferred home.

## 2020-08-05 ENCOUNTER — Ambulatory Visit (INDEPENDENT_AMBULATORY_CARE_PROVIDER_SITE_OTHER): Payer: Medicare Other | Admitting: Podiatry

## 2020-08-05 DIAGNOSIS — Z5329 Procedure and treatment not carried out because of patient's decision for other reasons: Secondary | ICD-10-CM

## 2020-08-05 NOTE — Progress Notes (Signed)
No show for appt. 

## 2020-08-12 ENCOUNTER — Other Ambulatory Visit: Payer: Self-pay

## 2020-08-12 ENCOUNTER — Ambulatory Visit (INDEPENDENT_AMBULATORY_CARE_PROVIDER_SITE_OTHER): Payer: Medicare Other | Admitting: Podiatry

## 2020-08-12 DIAGNOSIS — Z9889 Other specified postprocedural states: Secondary | ICD-10-CM

## 2020-08-12 DIAGNOSIS — M205X2 Other deformities of toe(s) (acquired), left foot: Secondary | ICD-10-CM

## 2020-08-12 MED ORDER — OXYCODONE-ACETAMINOPHEN 10-325 MG PO TABS: 1.0000 | ORAL_TABLET | ORAL | 0 refills | Status: DC | PRN

## 2020-08-12 NOTE — Progress Notes (Signed)
  Subjective:  Patient ID: Alexis Ball, female    DOB: Oct 15, 1955,  MRN: [id number]  Chief Complaint  Patient presents with  . Routine Post Op     POV #2 DOS 07/02/2020 LT FOOT REMOVAL OF ANTIBIOTIC SPACER, BONE BIOPSY 1ST MPJ IMPLANT. Pt reports constant pains especially at night and w/ excessive standing. Denies any signs of infection.     DOS: 07/02/20 Procedure: Left foot removal of hardware, bone biopsy, first metatarsophalangeal joint replacement with implant  65 y.o. female presents with the above complaint. History confirmed with patient.  Some pain in the foot especially at night.  Objective:  Physical Exam: tenderness at the surgical site, local edema noted and calf supple, nontender.Good range of motion noted at the first metatarsophalangeal joint Incision: healed  Elongated dystrophic nails Assessment:   1. Hallux limitus of left foot   2. Post-operative state     Plan:  Patient was evaluated and treated and all questions answered.  Post-operative State -Wound fully healed -Good ROM appreciated. -Refer to PT for ROM, strengthening, balance -Ok for normal shoegear  Return in about 4 weeks (around 09/09/2020).

## 2020-08-25 ENCOUNTER — Other Ambulatory Visit: Payer: Self-pay

## 2020-08-25 ENCOUNTER — Encounter: Payer: Self-pay | Admitting: Physical Therapy

## 2020-08-25 ENCOUNTER — Ambulatory Visit: Payer: Medicare Other | Attending: Podiatry | Admitting: Physical Therapy

## 2020-08-25 DIAGNOSIS — R2689 Other abnormalities of gait and mobility: Secondary | ICD-10-CM | POA: Insufficient documentation

## 2020-08-25 DIAGNOSIS — M25672 Stiffness of left ankle, not elsewhere classified: Secondary | ICD-10-CM | POA: Insufficient documentation

## 2020-08-25 DIAGNOSIS — M79672 Pain in left foot: Secondary | ICD-10-CM | POA: Insufficient documentation

## 2020-08-25 DIAGNOSIS — M6281 Muscle weakness (generalized): Secondary | ICD-10-CM | POA: Insufficient documentation

## 2020-08-25 NOTE — Therapy (Signed)
[place], [name], [address] Phone: [phone]   Fax:  [phone]  Physical Therapy Evaluation  Patient Details  Name: Alexis Ball MRN: [id number] Date of Birth: [birth year] Referring Provider (PT): Hallux limitus of left foot (M20.5X2), Post-operative state (Z98.890)   Encounter Date: 63/16/2022   PT End of Session - 08/25/20 1010    Visit Number 1    Number of Visits 13    Date for PT Re-Evaluation 10/06/20    Authorization Type mcr: Kx mod at 15th visit  FOTO 6th at 10th    Progress Note Due on Visit 10    PT Start Time 1015    PT Stop Time 1101    PT Time Calculation (min) 46 min    Activity Tolerance Patient tolerated treatment well    Behavior During Therapy [facility] for tasks assessed/performed           Past Medical History:  Diagnosis Date  . Allergic rhinitis   . Ambulates with cane   . Full dentures   . GERD (gastroesophageal reflux disease)    occasional , takes tums  . Glaucoma, both eyes   . Heart murmur    evalulated by cardiologist--- dr h. Johney Frame 01-07-2020 note in epic, echo done 01-31-2020 normal w/ no valve abnormalities  . History of cellulitis    2006  LLE  . History of chest pain    had cardiac cath 10-16-2004 (in epic)  showed normal coronaries that are tortous, patent renal arteries, normal LVF with ef 50-55%, noncardiac chest pain;  pt had ED visit 12-07-2019 negative work-up referred to cardiology, was seen by dr Johney Frame 01-07-2020 atypical cp / non-cardiac echo ordered / no further work-up  . History of CVA (cerebrovascular accident) without residual deficits    11-29-2019  per pt happened in 2007 and has not had cva/ tia sympomts since  . History of DVT of lower extremity    06-26-2020  per pt many years ago had RLE DVT completed blood thinner treatement stated not other blood clot until 07/ 2021 RUE with superficial venous thrombosis assosiated with PICC line  which was removed  . History of osteomyelitis 10/2019   left foot  post op bunionectomy 06/ 2021  . Hypertension    followed by pcp  . IDA (iron deficiency anemia)   . Migraines    11-29-2019 per pt previously had botox injection for prevention, last had 3 yrs ago,  now uses breathing , meditation, and minimizes triggers (bright light/ loud noise)  . Neuropathy    feet  . OA (osteoarthritis)   . Vascular dementia ([place])    per pcp h&p 06-18-2020 with mild cognitive impairment take, namenda with improvement  . Vitamin D deficiency   . Wears glasses     Past Surgical History:  Procedure Laterality Date  . BONE BIOPSY Left 10/14/2019   Procedure: BONE BIOPSY PROXIMAL PHALANX;  Surgeon: Evelina Bucy, DPM;  Location: [place];  Service: Podiatry;  Laterality: Left;  . BONE BIOPSY Left 04-30-2020  _0    x2  on left foot  . BONE BIOPSY Left 07/02/2020   Procedure: BONE BIOPSY - FROZEN SECTION;  Surgeon: Evelina Bucy, DPM;  Location: [place];  Service: Podiatry;  Laterality: Left;  . BUNIONECTOMY Left 10-05-2019  dr Milinda Pointer _1    great toe  . CARDIAC CATHETERIZATION  10-16-2004  dr Einar Gip   normal coronaries (tortuous), normal LVF  . CATARACT EXTRACTION  W/ INTRAOCULAR LENS  IMPLANT, BILATERAL  2013  . COLONOSCOPY  last one 04-23-2009  dr d. Sharlett Iles  . FOOT SURGERY Right unsure date   bunionectomy and hammertoe  . HARDWARE REMOVAL Right 06/ 2015  _0    right foot s/p bunionectomy/ hammertoe conrrection  . HARDWARE REMOVAL Left 12/05/2019   Procedure: HARDWARE REMOVAL ,WOUND CLOSURE OF FIRST TOE;  Surgeon: Evelina Bucy, DPM;  Location: [place];  Service: Podiatry;  Laterality: Left;  . IMPLANT OF A SILASTIC METATARSAL PHALANGE JOINT Left 07/02/2020   Procedure: IMPLANT OF A SILASTIC METATARSAL PHALANGE JOINT WITH REMOVAL OF ANTIBIOTIC SPACER;CAPSULAR REPAIR WITH TISSUE SUBSTITUTE;  Surgeon: Evelina Bucy, DPM;  Location: [place];  Service: Podiatry;  Laterality: Left;  . IRRIGATION AND DEBRIDEMENT FOOT Left 10/14/2019   Procedure: INCISION AND DRAINAGE FOR LEFT FOOT INFECTION, COMPLEX;  Surgeon: Evelina Bucy, DPM;  Location: [place];  Service: Podiatry;  Laterality: Left;  . IRRIGATION AND DEBRIDEMENT FOOT Left 10/17/2019   Procedure: Debridement and Irrigation of Left Foot; Removal of Silicone Implant; Insertion of Antibiotic Spacer; Application of External Fixator; Possible Application of Wound VAC;  Surgeon: Evelina Bucy, DPM;  Location: [place];  Service: Podiatry;  Laterality: Left;  Pre-op Block POP/SAPH; MAC  . KNEE ARTHROSCOPY Left 2018  . ORIF CALCANEOUS FRACTURE Left 10/17/2019   Procedure: Application External Fixation;  Surgeon: Evelina Bucy, DPM;  Location: [place];  Service: Podiatry;  Laterality: Left;    There were no vitals filed for this visit.    Subjective Assessment - 08/25/20 1025    Subjective pt is s/p L great toe surgery on 07/02/2020. She reports she is going slowly. She reports feeling off balance noting some of the issue related to her glaucoma. The pain in the foot hurts worse at night, and she reports soaking her foot and rubbing topical ointment on her foot.  She reports having an issue with the wound opening up limited the suture removal, she reported seeing an other DPM and they bandaged the foot up. She reports N/T in the foot/ ankle and sharp shooting pain.    How long can you sit comfortably? 30 min    How long can you stand comfortably? 15 min    How long can you walk comfortably? 30 min with [facility]    Diagnostic tests 07/03/2020   Expected postoperative changes without complicating features    Patient Stated Goals balance better, standing and walk longer, reduce pain    Currently in Pain? Yes    Pain Score 3    at worst 10/10 at night   Pain Location Foot    Pain Orientation Left    Pain Descriptors / Indicators Throbbing    Pain Type Surgical pain    Pain Onset More than a  month ago    Pain Frequency Intermittent    Aggravating Factors  prolonged sitting, standing/ walking, wearing shoes    Pain Relieving Factors heat, ice, ointment    Effect of Pain on Daily Activities limitd standing/ walking and sitting tolerance              [facility] PT Assessment - 08/25/20 0001      Assessment   Medical Diagnosis Evelina Bucy, DPM    Referring Provider (PT) Hallux limitus of left foot (M20.5X2), Post-operative state (O27.741)    Onset Date/Surgical Date 07/02/20    Hand Dominance Right    Next MD Visit 09/09/2020  Prior Therapy no      Precautions   Precautions None      Restrictions   Weight Bearing Restrictions No      Balance Screen   Has the patient fallen in the past 6 months Yes    How many times? 4    Has the patient had a decrease in activity level because of a fear of falling?  No    Is the patient reluctant to leave their home because of a fear of falling?  Yes      [place] residence    Living Arrangements Alone    Type of [place] to enter    Entrance Stairs-Number of Steps 7    Entrance Stairs-Rails --   middle   Home Layout One level    [place] - 2 wheels;Cane - single point;Grab bars - tub/shower;Shower seat      Prior Function   Level of Independence Independent with basic ADLs      Cognition   Overall Cognitive Status Within Functional Limits for tasks assessed      Observation/Other Assessments   Focus on Therapeutic Outcomes (FOTO)  51% function   57% predicted     ROM / Strength   AROM / PROM / Strength AROM;PROM;Strength      AROM   Overall AROM Comments 10 great toe ext    AROM Assessment Site Ankle    Right/Left Ankle Right;Left    Left Ankle Dorsiflexion 0    Left Ankle Plantar Flexion 45    Left Ankle Inversion 20    Left Ankle Eversion 5      PROM   Overall PROM Comments 60 great toe ext    PROM Assessment Site Ankle     Right/Left Ankle Left    Left Ankle Dorsiflexion 5      Strength   Strength Assessment Site Ankle;Knee    Right/Left Knee Right;Left    Right/Left Ankle Right;Left    Right Ankle Dorsiflexion 5/5    Right Ankle Plantar Flexion 5/5    Right Ankle Inversion 5/5    Right Ankle Eversion 5/5    Left Ankle Dorsiflexion 5/5    Left Ankle Plantar Flexion 4/5    Left Ankle Inversion 4+/5    Left Ankle Eversion 4+/5      Ambulation/Gait   Ambulation/Gait Yes                      Objective measurements completed on examination: See above findings.               PT Education - 08/25/20 1037    Education Details evaluation findings, POC, goals, HEP with proper form/ rationale, FOTO    Person(s) Educated Patient    Methods Explanation;Verbal cues;Handout    Comprehension Verbalized understanding;Verbal cues required            PT Short Term Goals - 08/25/20 1232      PT SHORT TERM GOAL #1   Title pt to be IND with inital HEP    Time 3    Period Weeks    Status New    Target Date 09/22/20      PT SHORT TERM GOAL #2   Title assess berg balance and update goal    Time 1    Period Weeks    Status New    Target Date 09/01/20  PT Long Term Goals - 08/25/20 1233      PT LONG TERM GOAL #1   Title increase L ankle dF to >/= 6 degrees for ROM required for efficient gait pattern.    Time 6    Period Weeks    Status New    Target Date 10/06/20      PT LONG TERM GOAL #2   Title increase great toe extension to >/= 60 degrees AROM to promote toe off pattern of gait    Time 6    Period Weeks    Status New    Target Date 10/06/20      PT LONG TERM GOAL #3   Title increase gross ankle strength to >/= 5/5 in all planes to promote stability and maximize safety    Baseline -    Time 6    Period Weeks    Status New    Target Date 10/06/20      PT LONG TERM GOAL #4   Title increase FOTO score to >/= 57% to demo improvement in function     Time 6    Period Weeks    Status New    Target Date 10/06/20      PT LONG TERM GOAL #5   Title pt to be able to walk and stand for >/= 45 min with LRAD for endurnace required for in home and community amb with </= 2/10 max pain    Time 6    Period Weeks    Status New    Target Date 10/06/20      Additional Long Term Goals   Additional Long Term Goals Yes      PT LONG TERM GOAL #6   Title pt to be IND with all HEP and is able to maintain and progress current LOF IND    Time 6    Period Weeks    Status New    Target Date 10/06/20                  Plan - 08/25/20 1035    Clinical Impression Statement pt is a pleasant 65 y.o F s/p L great toe joint replacement on 07/02/2020. she demonstrates limited ankle ROM, and L great toe extension mostly with DF and general weakness. She currently ambulates with SPC exhibiting mild antalgic gait pattern with abbreviated heel off/ toe off. She would benefit from physical therapy to promote ankle ROM, gait efficienty and maximize balance to promote safety and overall function.    Personal Factors and Comorbidities Age;Comorbidity 3+    Comorbidities hx of stroke, OA, HTN    Stability/Clinical Decision Making Evolving/Moderate complexity    Clinical Decision Making Moderate    Rehab Potential Good    PT Frequency 2x / week    PT Duration 6 weeks    PT Treatment/Interventions ADLs/Self Care Home Management;Moist Heat;Ultrasound;Cryotherapy;Gait training;Stair training;Functional mobility training;Therapeutic activities;Therapeutic exercise;Balance training;Neuromuscular re-education;Patient/family education;Manual techniques;Passive range of motion;Taping    PT Next Visit Plan review/ update HEP PRN gross ankle ROM and strength, gait training, assess BERG balance.    PT Home Exercise Plan [id number]    Consulted and Agree with Plan of Care Patient           Patient will benefit from skilled therapeutic intervention in order to improve the  following deficits and impairments:  Improper body mechanics,Increased muscle spasms,Decreased strength,Pain,Postural dysfunction  Visit Diagnosis: Pain in left foot  Other abnormalities of gait and mobility  Stiffness of  left ankle, not elsewhere classified  Muscle weakness (generalized)     Problem List Patient Active Problem List   Diagnosis Date Noted  . Osteomyelitis of toe of right foot ([place])   . Wound dehiscence   . PICC (peripherally inserted central catheter) in place 11/23/2019  . Medication monitoring encounter 11/23/2019  . Osteomyelitis of ankle or foot, acute, right ([place]) 11/22/2019  . Superficial venous thrombosis of arm, right 11/02/2019  . Abscess of bursa of left foot 10/14/2019  . Foot abscess, left 10/14/2019  . Wound infection   . Unilateral primary osteoarthritis, left knee 04/27/2019  . Chondromalacia patellae, left knee 07/02/2016  . Arthritis of left knee 02/25/2016  . Vaginitis and vulvovaginitis, unspecified 08/08/2013  . Leiomyoma of uterus, unspecified 08/08/2013  . Atypical chest pain 03/13/2013  . Abnormal ECG 03/13/2013  . Obesity, unspecified 03/13/2013  . Hypertension    [place] PT, DPT, LAT, ATC  08/25/20  12:43 PM      [facility] [address] [place], [name], [address] Phone: [phone]   Fax:  [phone]  Name: ETHELDA DEANGELO MRN: [id number] Date of Birth: May 19, 1955

## 2020-08-27 ENCOUNTER — Telehealth: Payer: Self-pay | Admitting: Registered Nurse

## 2020-08-27 NOTE — Telephone Encounter (Signed)
   Alexis Ball DOB: 07-24-1955 MRN: [id number]   RIDER WAIVER AND RELEASE OF LIABILITY  For purposes of improving physical access to our facilities, [facility] is pleased to partner with third parties to provide [place] patients or other authorized individuals the option of convenient, on-demand ground transportation services (the [place]") through use of the technology service that enables users to request on-demand ground transportation from independent third-party providers.  By opting to use and accept these [employer], I, the undersigned, hereby agree on behalf of myself, and on behalf of any minor child using the [employer] for whom I am the parent or legal guardian, as follows:  1. Government social research officer provided to me are provided by independent third-party transportation providers who are not [employer] or employees and who are unaffiliated with [employer]. 2. [facility] is neither a transportation carrier nor a common or public carrier. 3. [employer] has no control over the quality or safety of the transportation that occurs as a result of the [employer]. 4. [facility] cannot guarantee that any third-party transportation provider will complete any arranged transportation service. 5. [facility] makes no representation, warranty, or guarantee regarding the reliability, timeliness, quality, safety, suitability, or availability of any of the Transport Services or that they will be error free. 6. I fully understand that traveling by vehicle involves risks and dangers of serious bodily injury, including permanent disability, paralysis, and death. I agree, on behalf of myself and on behalf of any minor child using the Transport Services for whom I am the parent or legal guardian, that the entire risk arising out of my use of the [employer] remains solely with me, to the maximum extent permitted under applicable law. 7. The [employer] are provided "as is" and "as available." [facility] disclaims all representations and warranties, express, implied or statutory, not expressly set out in these terms, including the implied warranties of merchantability and fitness for a particular purpose. 8. I hereby waive and release [facility], its agents, employees, officers, directors, representatives, insurers, attorneys, assigns, successors, subsidiaries, and affiliates from any and all past, present, or future claims, demands, liabilities, actions, causes of action, or suits of any kind directly or indirectly arising from acceptance and use of the [employer]. 9. I further waive and release [facility] and its affiliates from all present and future liability and responsibility for any injury or death to persons or damages to property caused by or related to the use of the [employer]. 10. I have read this Waiver and Release of Liability, and I understand the terms used in it and their legal significance. This Waiver is freely and voluntarily given with the understanding that my right (as well as the right of any minor child for whom I am the parent or legal guardian using the [employer]) to legal recourse against  in connection with the [employer] is knowingly surrendered in return for use of these services.   I attest that I read the consent document to Alexis Ball, gave Alexis Ball the opportunity to ask questions and answered the questions asked (if any). I affirm that Alexis Ball then provided consent for she's participation in this program.     Alexis Ball

## 2020-09-03 ENCOUNTER — Other Ambulatory Visit: Payer: Self-pay

## 2020-09-03 ENCOUNTER — Ambulatory Visit: Payer: Medicare Other

## 2020-09-03 DIAGNOSIS — M79672 Pain in left foot: Secondary | ICD-10-CM

## 2020-09-03 DIAGNOSIS — M6281 Muscle weakness (generalized): Secondary | ICD-10-CM

## 2020-09-03 DIAGNOSIS — R2689 Other abnormalities of gait and mobility: Secondary | ICD-10-CM

## 2020-09-03 DIAGNOSIS — M25672 Stiffness of left ankle, not elsewhere classified: Secondary | ICD-10-CM

## 2020-09-03 NOTE — Therapy (Signed)
[place], [name], [address] Phone: [phone]   Fax:  [phone]  Physical Therapy Treatment  Patient Details  Name: Alexis Ball MRN: [id number] Date of Birth: Apr 01, 1956 Referring Provider (PT): Hallux limitus of left foot (M20.5X2), Post-operative state (Z98.890)   Encounter Date: 09/03/2020   PT End of Session - 09/03/20 1222    Visit Number 2    Number of Visits 13    Date for PT Re-Evaluation 10/06/20    Authorization Type mcr: Kx mod at 15th visit  FOTO 6th at 10th    Progress Note Due on Visit 10    PT Start Time 1130    PT Stop Time 1218    PT Time Calculation (min) 48 min    Activity Tolerance Patient tolerated treatment well    Behavior During Therapy [facility] for tasks assessed/performed           Past Medical History:  Diagnosis Date  . Allergic rhinitis   . Ambulates with cane   . Full dentures   . GERD (gastroesophageal reflux disease)    occasional , takes tums  . Glaucoma, both eyes   . Heart murmur    evalulated by cardiologist--- dr h. Johney Frame 01-07-2020 note in epic, echo done 01-31-2020 normal w/ no valve abnormalities  . History of cellulitis    2006  LLE  . History of chest pain    had cardiac cath 10-16-2004 (in epic)  showed normal coronaries that are tortous, patent renal arteries, normal LVF with ef 50-55%, noncardiac chest pain;  pt had ED visit 12-07-2019 negative work-up referred to cardiology, was seen by dr Johney Frame 01-07-2020 atypical cp / non-cardiac echo ordered / no further work-up  . History of CVA (cerebrovascular accident) without residual deficits    11-29-2019  per pt happened in 2007 and has not had cva/ tia sympomts since  . History of DVT of lower extremity    06-26-2020  per pt many years ago had RLE DVT completed blood thinner treatement stated not other blood clot until 07/ 2021 RUE with superficial venous thrombosis assosiated with PICC line  which was removed  . History of osteomyelitis 10/2019   left foot  post op bunionectomy 06/ 2021  . Hypertension    followed by pcp  . IDA (iron deficiency anemia)   . Migraines    11-29-2019 per pt previously had botox injection for prevention, last had 3 yrs ago,  now uses breathing , meditation, and minimizes triggers (bright light/ loud noise)  . Neuropathy    feet  . OA (osteoarthritis)   . Vascular dementia ([place])    per pcp h&p 06-18-2020 with mild cognitive impairment take, namenda with improvement  . Vitamin D deficiency   . Wears glasses     Past Surgical History:  Procedure Laterality Date  . BONE BIOPSY Left 10/14/2019   Procedure: BONE BIOPSY PROXIMAL PHALANX;  Surgeon: Evelina Bucy, DPM;  Location: [place];  Service: Podiatry;  Laterality: Left;  . BONE BIOPSY Left 04-30-2020  _0    x2  on left foot  . BONE BIOPSY Left 07/02/2020   Procedure: BONE BIOPSY - FROZEN SECTION;  Surgeon: Evelina Bucy, DPM;  Location: [place];  Service: Podiatry;  Laterality: Left;  . BUNIONECTOMY Left 10-05-2019  dr Milinda Pointer _1    great toe  . CARDIAC CATHETERIZATION  10-16-2004  dr Einar Gip   normal coronaries (tortuous), normal LVF  . CATARACT EXTRACTION  W/ INTRAOCULAR LENS  IMPLANT, BILATERAL  2013  . COLONOSCOPY  last one 04-23-2009  dr d. Sharlett Iles  . FOOT SURGERY Right unsure date   bunionectomy and hammertoe  . HARDWARE REMOVAL Right 06/ 2015  _0    right foot s/p bunionectomy/ hammertoe conrrection  . HARDWARE REMOVAL Left 12/05/2019   Procedure: HARDWARE REMOVAL ,WOUND CLOSURE OF FIRST TOE;  Surgeon: Evelina Bucy, DPM;  Location: [place];  Service: Podiatry;  Laterality: Left;  . IMPLANT OF A SILASTIC METATARSAL PHALANGE JOINT Left 07/02/2020   Procedure: IMPLANT OF A SILASTIC METATARSAL PHALANGE JOINT WITH REMOVAL OF ANTIBIOTIC SPACER;CAPSULAR REPAIR WITH TISSUE SUBSTITUTE;  Surgeon: Evelina Bucy, DPM;  Location: [place];  Service: Podiatry;  Laterality: Left;  . IRRIGATION AND DEBRIDEMENT FOOT Left 10/14/2019   Procedure: INCISION AND DRAINAGE FOR LEFT FOOT INFECTION, COMPLEX;  Surgeon: Evelina Bucy, DPM;  Location: [place];  Service: Podiatry;  Laterality: Left;  . IRRIGATION AND DEBRIDEMENT FOOT Left 10/17/2019   Procedure: Debridement and Irrigation of Left Foot; Removal of Silicone Implant; Insertion of Antibiotic Spacer; Application of External Fixator; Possible Application of Wound VAC;  Surgeon: Evelina Bucy, DPM;  Location: [place];  Service: Podiatry;  Laterality: Left;  Pre-op Block POP/SAPH; MAC  . KNEE ARTHROSCOPY Left 2018  . ORIF CALCANEOUS FRACTURE Left 10/17/2019   Procedure: Application External Fixation;  Surgeon: Evelina Bucy, DPM;  Location: [place];  Service: Podiatry;  Laterality: Left;    There were no vitals filed for this visit.   Subjective Assessment - 09/03/20 1132    Subjective Pt reports 5/10 pain under her L toes today. She also states that she has been adherent to her HEP, although she adds that she cannot stand longer than 30 minutes before needed a seated rest break. She states that the backward lunge exercise increases her pain, although she states she feels it is working. She also reports that a "lump" has begun to form under her foot. She says she thinks this is a bunion and points to her L 5th met head, where a bunionette is visible.    Limitations Standing;Walking    How long can you sit comfortably? 30 min    How long can you stand comfortably? 30 min    How long can you walk comfortably? 30 min with [facility]    Diagnostic tests 07/03/2020   Expected postoperative changes without complicating features    Patient Stated Goals balance better, standing and walk longer, reduce pain    Currently in Pain? Yes    Pain Score 5     Pain Location Foot    Pain Orientation Left    Pain Descriptors / Indicators Throbbing    Pain Type Surgical pain    Pain Onset More than a month  ago    Pain Frequency Intermittent              [facility] PT Assessment - 09/03/20 0001      Palpation   Palpation comment L 1st MTP hypomobile with AP/PA joint assessment                         [facility] Adult PT Treatment/Exercise - 09/03/20 0001      Cryotherapy   Number Minutes Cryotherapy 10 Minutes    Cryotherapy Location --   L ankle, foot   Type of Cryotherapy Ice pack      Manual Therapy  Manual therapy comments L 1st MTP grade 4 AP/PA glides x55mn      Ankle Exercises: Standing   Other Standing Ankle Exercises --   Balance clocks with heel raise on stance foot 2x10 on L     Ankle Exercises: Seated   Toe Raise --   2x10 with heel and met heads on ground against isometric resistance, alternate with isometric toe flexion into floor                 PT Education - 09/03/20 1222    Education Details Pt instructed on importance of HEP adherence and proper form when performing exercises.    Person(s) Educated Patient    Methods Explanation;Demonstration;Tactile cues;Verbal cues    Comprehension Verbalized understanding;Returned demonstration            PT Short Term Goals - 08/25/20 1232      PT SHORT TERM GOAL #1   Title pt to be IND with inital HEP    Time 3    Period Weeks    Status New    Target Date 09/22/20      PT SHORT TERM GOAL #2   Title assess berg balance and update goal    Time 1    Period Weeks    Status New    Target Date 09/01/20             PT Long Term Goals - 08/25/20 1233      PT LONG TERM GOAL #1   Title increase L ankle dF to >/= 6 degrees for ROM required for efficient gait pattern.    Time 6    Period Weeks    Status New    Target Date 10/06/20      PT LONG TERM GOAL #2   Title increase great toe extension to >/= 60 degrees AROM to promote toe off pattern of gait    Time 6    Period Weeks    Status New    Target Date 10/06/20      PT LONG TERM GOAL #3   Title increase gross ankle strength to >/=  5/5 in all planes to promote stability and maximize safety    Baseline -    Time 6    Period Weeks    Status New    Target Date 10/06/20      PT LONG TERM GOAL #4   Title increase FOTO score to >/= 57% to demo improvement in function    Time 6    Period Weeks    Status New    Target Date 10/06/20      PT LONG TERM GOAL #5   Title pt to be able to walk and stand for >/= 45 min with LRAD for endurnace required for in home and community amb with </= 2/10 max pain    Time 6    Period Weeks    Status New    Target Date 10/06/20      Additional Long Term Goals   Additional Long Term Goals Yes      PT LONG TERM GOAL #6   Title pt to be IND with all HEP and is able to maintain and progress current LOF IND    Time 6    Period Weeks    Status New    Target Date 10/06/20                 Plan - 09/03/20 1213  Clinical Impression Statement Pt responded well to all treatment today, reporting a decrease in pain following 1st MTP joint mobilizations and cryotherapy. She required tactile and verbal cuing to achieve proper form with balance clocks and isometric great toe extension and flexion. She leaves clinic today with 2/10 pain, a decrease from 5/10 at arrival. She will continue to benefit from skilled PT to address her primary impairments and help her return to her prior level of function without pain.    Personal Factors and Comorbidities Age;Comorbidity 3+    Comorbidities hx of stroke, OA, HTN    Stability/Clinical Decision Making Evolving/Moderate complexity    Clinical Decision Making Moderate    Rehab Potential Good    PT Frequency 2x / week    PT Duration 6 weeks    PT Treatment/Interventions ADLs/Self Care Home Management;Moist Heat;Ultrasound;Cryotherapy;Gait training;Stair training;Functional mobility training;Therapeutic activities;Therapeutic exercise;Balance training;Neuromuscular re-education;Patient/family education;Manual techniques;Passive range of motion;Taping     PT Next Visit Plan review/ update HEP PRN gross ankle ROM and strength, gait training, assess BERG balance.    PT Home Exercise Plan [id number]    Consulted and Agree with Plan of Care Patient           Patient will benefit from skilled therapeutic intervention in order to improve the following deficits and impairments:  Improper body mechanics,Increased muscle spasms,Decreased strength,Pain,Postural dysfunction  Visit Diagnosis: Pain in left foot  Other abnormalities of gait and mobility  Stiffness of left ankle, not elsewhere classified  Muscle weakness (generalized)     Problem List Patient Active Problem List   Diagnosis Date Noted  . Osteomyelitis of toe of right foot ([place])   . Wound dehiscence   . PICC (peripherally inserted central catheter) in place 11/23/2019  . Medication monitoring encounter 11/23/2019  . Osteomyelitis of ankle or foot, acute, right ([place]) 11/22/2019  . Superficial venous thrombosis of arm, right 11/02/2019  . Abscess of bursa of left foot 10/14/2019  . Foot abscess, left 10/14/2019  . Wound infection   . Unilateral primary osteoarthritis, left knee 04/27/2019  . Chondromalacia patellae, left knee 07/02/2016  . Arthritis of left knee 02/25/2016  . Vaginitis and vulvovaginitis, unspecified 08/08/2013  . Leiomyoma of uterus, unspecified 08/08/2013  . Atypical chest pain 03/13/2013  . Abnormal ECG 03/13/2013  . Obesity, unspecified 03/13/2013  . Hypertension     [employer], PT, DPT 09/03/20 12:27 PM  [facility] [address] [place], [name], [address] Phone: [phone]   Fax:  [phone]  Name: Alexis Ball MRN: [id number] Date of Birth: [birth year]

## 2020-09-03 NOTE — Patient Instructions (Signed)
  [id number]

## 2020-09-09 ENCOUNTER — Other Ambulatory Visit: Payer: Self-pay

## 2020-09-09 ENCOUNTER — Ambulatory Visit (INDEPENDENT_AMBULATORY_CARE_PROVIDER_SITE_OTHER): Payer: Medicare Other | Admitting: Podiatry

## 2020-09-09 DIAGNOSIS — Z9889 Other specified postprocedural states: Secondary | ICD-10-CM

## 2020-09-09 MED ORDER — OXYCODONE-ACETAMINOPHEN 10-325 MG PO TABS: 1.0000 | ORAL_TABLET | ORAL | 0 refills | Status: DC | PRN

## 2020-09-09 NOTE — Progress Notes (Signed)
  Subjective:  Patient ID: Alexis Ball, female    DOB: [birth year],  MRN: [id number]  Chief Complaint  Patient presents with  . Routine Post Op    Follow up POV DOS 07/02/20. Left foot hardware removal. Pt states she is still having some pain. Unable to stand more than 15 minutes. PT is causing more burning pains.      DOS: 07/02/20 Procedure: Left foot removal of hardware, bone biopsy, first metatarsophalangeal joint replacement with implant  65 y.o. female presents with the above complaint. History confirmed with patient.   Objective:  Physical Exam: tenderness at the surgical site, local edema noted and calf supple, nontender.Good range of motion noted at the first metatarsophalangeal joint Incision: healed Assessment:   1. Post-operative state     Plan:  Patient was evaluated and treated and all questions answered.  Post-operative State -Wound fully healed -Good ROM appreciated -Continue PT for ROM, strengthening, balance -Ok for normal shoegear -Final pain medication refill at this time.  Return in about 4 weeks (around 10/07/2020) for Joint replacement f/u.

## 2020-09-10 ENCOUNTER — Ambulatory Visit: Payer: Medicare Other | Attending: Podiatry

## 2020-09-10 DIAGNOSIS — M25672 Stiffness of left ankle, not elsewhere classified: Secondary | ICD-10-CM

## 2020-09-10 DIAGNOSIS — R2689 Other abnormalities of gait and mobility: Secondary | ICD-10-CM | POA: Diagnosis present

## 2020-09-10 DIAGNOSIS — M79672 Pain in left foot: Secondary | ICD-10-CM | POA: Insufficient documentation

## 2020-09-10 DIAGNOSIS — M6281 Muscle weakness (generalized): Secondary | ICD-10-CM | POA: Insufficient documentation

## 2020-09-10 NOTE — Therapy (Signed)
[place], [name], [address] Phone: [phone]   Fax:  [phone]  Physical Therapy Treatment  Patient Details  Name: Alexis Ball MRN: [id number] Date of Birth: 1955/05/21 Referring Provider (PT): Hallux limitus of left foot (M20.5X2), Post-operative state (Z98.890)   Encounter Date: 09/10/2020   PT End of Session - 09/10/20 1218    Visit Number 3    Number of Visits 13    Date for PT Re-Evaluation 10/06/20    Authorization Type mcr: Kx mod at 15th visit  FOTO 6th at 10th    Progress Note Due on Visit 10    PT Start Time 1137    PT Stop Time 1220    PT Time Calculation (min) 43 min    Activity Tolerance Patient tolerated treatment well;Patient limited by pain    Behavior During Therapy [facility] for tasks assessed/performed           Past Medical History:  Diagnosis Date  . Allergic rhinitis   . Ambulates with cane   . Full dentures   . GERD (gastroesophageal reflux disease)    occasional , takes tums  . Glaucoma, both eyes   . Heart murmur    evalulated by cardiologist--- dr h. Johney Frame 01-07-2020 note in epic, echo done 01-31-2020 normal w/ no valve abnormalities  . History of cellulitis    2006  LLE  . History of chest pain    had cardiac cath 10-16-2004 (in epic)  showed normal coronaries that are tortous, patent renal arteries, normal LVF with ef 50-55%, noncardiac chest pain;  pt had ED visit 12-07-2019 negative work-up referred to cardiology, was seen by dr Johney Frame 01-07-2020 atypical cp / non-cardiac echo ordered / no further work-up  . History of CVA (cerebrovascular accident) without residual deficits    11-29-2019  per pt happened in 2007 and has not had cva/ tia sympomts since  . History of DVT of lower extremity    06-26-2020  per pt many years ago had RLE DVT completed blood thinner treatement stated not other blood clot until 07/ 2021 RUE with superficial venous thrombosis  assosiated with PICC line which was removed  . History of osteomyelitis 10/2019   left foot  post op bunionectomy 06/ 2021  . Hypertension    followed by pcp  . IDA (iron deficiency anemia)   . Migraines    11-29-2019 per pt previously had botox injection for prevention, last had 3 yrs ago,  now uses breathing , meditation, and minimizes triggers (bright light/ loud noise)  . Neuropathy    feet  . OA (osteoarthritis)   . Vascular dementia ([place])    per pcp h&p 06-18-2020 with mild cognitive impairment take, namenda with improvement  . Vitamin D deficiency   . Wears glasses     Past Surgical History:  Procedure Laterality Date  . BONE BIOPSY Left 10/14/2019   Procedure: BONE BIOPSY PROXIMAL PHALANX;  Surgeon: Evelina Bucy, DPM;  Location: [place];  Service: Podiatry;  Laterality: Left;  . BONE BIOPSY Left 04-30-2020  _0    x2  on left foot  . BONE BIOPSY Left 07/02/2020   Procedure: BONE BIOPSY - FROZEN SECTION;  Surgeon: Evelina Bucy, DPM;  Location: [place];  Service: Podiatry;  Laterality: Left;  . BUNIONECTOMY Left 10-05-2019  dr Milinda Pointer _1    great toe  . CARDIAC CATHETERIZATION  10-16-2004  dr Einar Gip   normal coronaries (tortuous), normal LVF  .  CATARACT EXTRACTION W/ INTRAOCULAR LENS  IMPLANT, BILATERAL  2013  . COLONOSCOPY  last one 04-23-2009  dr d. Sharlett Iles  . FOOT SURGERY Right unsure date   bunionectomy and hammertoe  . HARDWARE REMOVAL Right 06/ 2015  _0    right foot s/p bunionectomy/ hammertoe conrrection  . HARDWARE REMOVAL Left 12/05/2019   Procedure: HARDWARE REMOVAL ,WOUND CLOSURE OF FIRST TOE;  Surgeon: Evelina Bucy, DPM;  Location: [place];  Service: Podiatry;  Laterality: Left;  . IMPLANT OF A SILASTIC METATARSAL PHALANGE JOINT Left 07/02/2020   Procedure: IMPLANT OF A SILASTIC METATARSAL PHALANGE JOINT WITH REMOVAL OF ANTIBIOTIC SPACER;CAPSULAR REPAIR WITH TISSUE SUBSTITUTE;  Surgeon: Evelina Bucy, DPM;   Location: [place];  Service: Podiatry;  Laterality: Left;  . IRRIGATION AND DEBRIDEMENT FOOT Left 10/14/2019   Procedure: INCISION AND DRAINAGE FOR LEFT FOOT INFECTION, COMPLEX;  Surgeon: Evelina Bucy, DPM;  Location: [place];  Service: Podiatry;  Laterality: Left;  . IRRIGATION AND DEBRIDEMENT FOOT Left 10/17/2019   Procedure: Debridement and Irrigation of Left Foot; Removal of Silicone Implant; Insertion of Antibiotic Spacer; Application of External Fixator; Possible Application of Wound VAC;  Surgeon: Evelina Bucy, DPM;  Location: [place];  Service: Podiatry;  Laterality: Left;  Pre-op Block POP/SAPH; MAC  . KNEE ARTHROSCOPY Left 2018  . ORIF CALCANEOUS FRACTURE Left 10/17/2019   Procedure: Application External Fixation;  Surgeon: Evelina Bucy, DPM;  Location: [place];  Service: Podiatry;  Laterality: Left;    There were no vitals filed for this visit.   Subjective Assessment - 09/10/20 1138    Subjective Pt reports to clinic with subjective reports of ankle/ foot swelling and throbbing pain. She has been soaking her foot in epson salt baths for pain relief. She also reports being adherent to her HEP, performing her exercises consistently. She adds that she continues to notice a bunion forming on her lateral L foot, as well as recurrent L hip pain, which she acknowledges may be coming from a change in her gait pattern. Her pain remains about the same as when she started PT.    Limitations Standing;Walking    How long can you sit comfortably? 30 min    How long can you stand comfortably? 30 min    How long can you walk comfortably? 30 min with [facility]    Diagnostic tests 07/03/2020   Expected postoperative changes without complicating features    Patient Stated Goals balance better, standing and walk longer, reduce pain    Currently in Pain? Yes    Pain Score 8     Pain Location Foot    Pain Orientation Left    Pain Descriptors / Indicators Throbbing    Pain Type Surgical  pain    Pain Onset More than a month ago    Pain Frequency Constant    Multiple Pain Sites No                             [facility] Adult PT Treatment/Exercise - 09/10/20 0001      Cryotherapy   Number Minutes Cryotherapy 10 Minutes    Type of Cryotherapy Ice pack      Manual Therapy   Manual therapy comments L 1st MTP grade 4 AP/PA glides      Ankle Exercises: Standing   Heel Raises --   2x10 at parallel bars   Other Standing Ankle Exercises L backward  lunge with emphasis on great toe extension 1x10                  PT Education - 09/10/20 1217    Education Details Pt educated on importance of correctly performing her home exercises with an emphasis on regaining great toe extension ROM and decreasing pain.    Person(s) Educated Patient    Methods Explanation;Tactile cues;Demonstration;Verbal cues;Handout    Comprehension Tactile cues required;Verbalized understanding;Returned demonstration;Verbal cues required            PT Short Term Goals - 08/25/20 1232      PT SHORT TERM GOAL #1   Title pt to be IND with inital HEP    Time 3    Period Weeks    Status New    Target Date 09/22/20      PT SHORT TERM GOAL #2   Title assess berg balance and update goal    Time 1    Period Weeks    Status New    Target Date 09/01/20             PT Long Term Goals - 08/25/20 1233      PT LONG TERM GOAL #1   Title increase L ankle dF to >/= 6 degrees for ROM required for efficient gait pattern.    Time 6    Period Weeks    Status New    Target Date 10/06/20      PT LONG TERM GOAL #2   Title increase great toe extension to >/= 60 degrees AROM to promote toe off pattern of gait    Time 6    Period Weeks    Status New    Target Date 10/06/20      PT LONG TERM GOAL #3   Title increase gross ankle strength to >/= 5/5 in all planes to promote stability and maximize safety    Baseline -    Time 6    Period Weeks    Status New    Target Date  10/06/20      PT LONG TERM GOAL #4   Title increase FOTO score to >/= 57% to demo improvement in function    Time 6    Period Weeks    Status New    Target Date 10/06/20      PT LONG TERM GOAL #5   Title pt to be able to walk and stand for >/= 45 min with LRAD for endurnace required for in home and community amb with </= 2/10 max pain    Time 6    Period Weeks    Status New    Target Date 10/06/20      Additional Long Term Goals   Additional Long Term Goals Yes      PT LONG TERM GOAL #6   Title pt to be IND with all HEP and is able to maintain and progress current LOF IND    Time 6    Period Weeks    Status New    Target Date 10/06/20                 Plan - 09/10/20 1224    Clinical Impression Statement Pt responded well to all treatment today, reporting a decrease in pain following 1st MTP joint mobilizations and cryotherapy. She required tactile and verbal cuing to achieve proper form with backward lunges with an emphasis on great toe extension, as well as with heel raises. She  leaves clinic today with 2/10 pain, a decrease from 8/10 at arrival. The pt tends to avoid weight bearing through the L great toe during exercises and gait, which may be lending to the formation of a bunionette on her lateral L foot and hip pain. The pt reports understanding of importance of weight bearing through the great toe during prescribed exercises. She will continue to benefit from skilled PT to address her primary impairments and help her return to her prior level of function without pain    Personal Factors and Comorbidities Age;Comorbidity 3+    Comorbidities hx of stroke, OA, HTN    Stability/Clinical Decision Making Evolving/Moderate complexity    Clinical Decision Making Moderate    Rehab Potential Good    PT Frequency 2x / week    PT Duration 6 weeks    PT Treatment/Interventions ADLs/Self Care Home Management;Moist Heat;Ultrasound;Cryotherapy;Gait training;Stair training;Functional  mobility training;Therapeutic activities;Therapeutic exercise;Balance training;Neuromuscular re-education;Patient/family education;Manual techniques;Passive range of motion;Taping    PT Next Visit Plan Great toe ROM and LE strength, gait training, assess BERG balance.    PT Home Exercise Plan [id number]    Consulted and Agree with Plan of Care Patient           Patient will benefit from skilled therapeutic intervention in order to improve the following deficits and impairments:  Improper body mechanics,Increased muscle spasms,Decreased strength,Pain,Postural dysfunction  Visit Diagnosis: Pain in left foot  Other abnormalities of gait and mobility  Stiffness of left ankle, not elsewhere classified  Muscle weakness (generalized)     Problem List Patient Active Problem List   Diagnosis Date Noted  . Osteomyelitis of toe of right foot ([place])   . Wound dehiscence   . PICC (peripherally inserted central catheter) in place 11/23/2019  . Medication monitoring encounter 11/23/2019  . Osteomyelitis of ankle or foot, acute, right ([place]) 11/22/2019  . Superficial venous thrombosis of arm, right 11/02/2019  . Abscess of bursa of left foot 10/14/2019  . Foot abscess, left 10/14/2019  . Wound infection   . Unilateral primary osteoarthritis, left knee 04/27/2019  . Chondromalacia patellae, left knee 07/02/2016  . Arthritis of left knee 02/25/2016  . Vaginitis and vulvovaginitis, unspecified 08/08/2013  . Leiomyoma of uterus, unspecified 08/08/2013  . Atypical chest pain 03/13/2013  . Abnormal ECG 03/13/2013  . Obesity, unspecified 03/13/2013  . Hypertension     Carolann Littler [facility] 09/10/2020, 12:34 PM  [facility] [address] [place], [name], [address] Phone: [phone]   Fax:  [phone]  Name: CAYLEE VLACHOS MRN: [id number] Date of Birth: 17-Oct-1955

## 2020-09-10 NOTE — Patient Instructions (Signed)
   [id number]

## 2020-09-12 ENCOUNTER — Ambulatory Visit: Payer: Medicare Other

## 2020-09-12 ENCOUNTER — Other Ambulatory Visit: Payer: Self-pay

## 2020-09-12 DIAGNOSIS — M25672 Stiffness of left ankle, not elsewhere classified: Secondary | ICD-10-CM

## 2020-09-12 DIAGNOSIS — M79672 Pain in left foot: Secondary | ICD-10-CM | POA: Diagnosis not present

## 2020-09-12 DIAGNOSIS — R2689 Other abnormalities of gait and mobility: Secondary | ICD-10-CM

## 2020-09-12 DIAGNOSIS — M6281 Muscle weakness (generalized): Secondary | ICD-10-CM

## 2020-09-12 NOTE — Therapy (Signed)
[place], [name], [address] Phone: [phone]   Fax:  [phone]  Physical Therapy Treatment  Patient Details  Name: Alexis Ball MRN: [id number] Date of Birth: 1955-09-29 Referring Provider (PT): Hallux limitus of left foot (M20.5X2), Post-operative state (Z98.890)   Encounter Date: 09/12/2020   PT End of Session - 09/12/20 1301    Visit Number 4    Number of Visits 13    Date for PT Re-Evaluation 10/06/20    Authorization Type mcr: Kx mod at 15th visit  FOTO 6th at 10th    Progress Note Due on Visit 10    PT Start Time 1140    PT Stop Time 1225    PT Time Calculation (min) 45 min    Equipment Utilized During Treatment Gait belt    Activity Tolerance Patient tolerated treatment well;Patient limited by pain    Behavior During Therapy WFL for tasks assessed/performed           Past Medical History:  Diagnosis Date  . Allergic rhinitis   . Ambulates with cane   . Full dentures   . GERD (gastroesophageal reflux disease)    occasional , takes tums  . Glaucoma, both eyes   . Heart murmur    evalulated by cardiologist--- dr h. Johney Frame 01-07-2020 note in epic, echo done 01-31-2020 normal w/ no valve abnormalities  . History of cellulitis    2006  LLE  . History of chest pain    had cardiac cath 10-16-2004 (in epic)  showed normal coronaries that are tortous, patent renal arteries, normal LVF with ef 50-55%, noncardiac chest pain;  pt had ED visit 12-07-2019 negative work-up referred to cardiology, was seen by dr Johney Frame 01-07-2020 atypical cp / non-cardiac echo ordered / no further work-up  . History of CVA (cerebrovascular accident) without residual deficits    11-29-2019  per pt happened in 2007 and has not had cva/ tia sympomts since  . History of DVT of lower extremity    06-26-2020  per pt many years ago had RLE DVT completed blood thinner treatement stated not other blood clot until  07/ 2021 RUE with superficial venous thrombosis assosiated with PICC line which was removed  . History of osteomyelitis 10/2019   left foot  post op bunionectomy 06/ 2021  . Hypertension    followed by pcp  . IDA (iron deficiency anemia)   . Migraines    11-29-2019 per pt previously had botox injection for prevention, last had 3 yrs ago,  now uses breathing , meditation, and minimizes triggers (bright light/ loud noise)  . Neuropathy    feet  . OA (osteoarthritis)   . Vascular dementia ([place])    per pcp h&p 06-18-2020 with mild cognitive impairment take, namenda with improvement  . Vitamin D deficiency   . Wears glasses     Past Surgical History:  Procedure Laterality Date  . BONE BIOPSY Left 10/14/2019   Procedure: BONE BIOPSY PROXIMAL PHALANX;  Surgeon: Evelina Bucy, DPM;  Location: [facility];  Service: Podiatry;  Laterality: Left;  . BONE BIOPSY Left 04-30-2020  _0    x2  on left foot  . BONE BIOPSY Left 07/02/2020   Procedure: BONE BIOPSY - FROZEN SECTION;  Surgeon: Evelina Bucy, DPM;  Location: [facility];  Service: Podiatry;  Laterality: Left;  . BUNIONECTOMY Left 10-05-2019  dr Milinda Pointer _1    great toe  . CARDIAC CATHETERIZATION  10-16-2004  dr  ganji   normal coronaries (tortuous), normal LVF  . CATARACT EXTRACTION W/ INTRAOCULAR LENS  IMPLANT, BILATERAL  2013  . COLONOSCOPY  last one 04-23-2009  dr d. Sharlett Iles  . FOOT SURGERY Right unsure date   bunionectomy and hammertoe  . HARDWARE REMOVAL Right 06/ 2015  _0    right foot s/p bunionectomy/ hammertoe conrrection  . HARDWARE REMOVAL Left 12/05/2019   Procedure: HARDWARE REMOVAL ,WOUND CLOSURE OF FIRST TOE;  Surgeon: Evelina Bucy, DPM;  Location: [facility];  Service: Podiatry;  Laterality: Left;  . IMPLANT OF A SILASTIC METATARSAL PHALANGE JOINT Left 07/02/2020   Procedure: IMPLANT OF A SILASTIC METATARSAL PHALANGE JOINT WITH REMOVAL OF ANTIBIOTIC SPACER;CAPSULAR REPAIR WITH TISSUE  SUBSTITUTE;  Surgeon: Evelina Bucy, DPM;  Location: [facility];  Service: Podiatry;  Laterality: Left;  . IRRIGATION AND DEBRIDEMENT FOOT Left 10/14/2019   Procedure: INCISION AND DRAINAGE FOR LEFT FOOT INFECTION, COMPLEX;  Surgeon: Evelina Bucy, DPM;  Location: [facility];  Service: Podiatry;  Laterality: Left;  . IRRIGATION AND DEBRIDEMENT FOOT Left 10/17/2019   Procedure: Debridement and Irrigation of Left Foot; Removal of Silicone Implant; Insertion of Antibiotic Spacer; Application of External Fixator; Possible Application of Wound VAC;  Surgeon: Evelina Bucy, DPM;  Location: [facility];  Service: Podiatry;  Laterality: Left;  Pre-op Block POP/SAPH; MAC  . KNEE ARTHROSCOPY Left 2018  . ORIF CALCANEOUS FRACTURE Left 10/17/2019   Procedure: Application External Fixation;  Surgeon: Evelina Bucy, DPM;  Location: [facility];  Service: Podiatry;  Laterality: Left;    There were no vitals filed for this visit.   Subjective Assessment - 09/12/20 1130    Subjective Pt reports increased pain and soreness today that she attributes to increased activity and walking the past few days. She states that her L foot is throbbing and achy. She adds that she has been adherent to her HEP, able to complete all exercises with increased pain only with great toe extension. She says this is manageable, however.    Limitations Standing;Walking    How long can you sit comfortably? 30 min    How long can you stand comfortably? 30 min    How long can you walk comfortably? 30 min with [facility]    Diagnostic tests 07/03/2020   Expected postoperative changes without complicating features    Patient Stated Goals balance better, standing and walk longer, reduce pain    Currently in Pain? Yes    Pain Score 7     Pain Location Foot    Pain Orientation Left    Pain Descriptors / Indicators Throbbing    Pain Type Surgical pain    Pain Onset More than a month ago    Pain Frequency Constant                              [facility] Adult PT Treatment/Exercise - 09/12/20 0001      Knee/Hip Exercises: Standing   Hip Flexion --   x10 BIL with RTB   Hip Abduction --   x10 BIL with RTB   Hip Extension --   x10 BIL with RTB     Cryotherapy   Number Minutes Cryotherapy 10 Minutes    Type of Cryotherapy Ice pack      Manual Therapy   Manual Therapy Joint mobilization    Joint Mobilization L 1st MTP AP/PA grade 4 mobilizations  Ankle Exercises: Standing   Other Standing Ankle Exercises Tandem stance 2x30sec BIL on Airex pad in parallel bars    Other Standing Ankle Exercises Step through on Airex pad in paralle bars with gait belt 2x10 BIL                  PT Education - 09/12/20 1300    Education Details Pt instructed on importance of graded exposure to weight bearing through her affected great toe and performing her exercises with an emphasis on SL stance and ankle strengthening.    Person(s) Educated Patient    Methods Explanation;Handout    Comprehension Verbalized understanding            PT Short Term Goals - 08/25/20 1232      PT SHORT TERM GOAL #1   Title pt to be IND with inital HEP    Time 3    Period Weeks    Status New    Target Date 09/22/20      PT SHORT TERM GOAL #2   Title assess berg balance and update goal    Time 1    Period Weeks    Status New    Target Date 09/01/20             PT Long Term Goals - 08/25/20 1233      PT LONG TERM GOAL #1   Title increase L ankle dF to >/= 6 degrees for ROM required for efficient gait pattern.    Time 6    Period Weeks    Status New    Target Date 10/06/20      PT LONG TERM GOAL #2   Title increase great toe extension to >/= 60 degrees AROM to promote toe off pattern of gait    Time 6    Period Weeks    Status New    Target Date 10/06/20      PT LONG TERM GOAL #3   Title increase gross ankle strength to >/= 5/5 in all planes to promote stability and maximize safety     Baseline -    Time 6    Period Weeks    Status New    Target Date 10/06/20      PT LONG TERM GOAL #4   Title increase FOTO score to >/= 57% to demo improvement in function    Time 6    Period Weeks    Status New    Target Date 10/06/20      PT LONG TERM GOAL #5   Title pt to be able to walk and stand for >/= 45 min with LRAD for endurnace required for in home and community amb with </= 2/10 max pain    Time 6    Period Weeks    Status New    Target Date 10/06/20      Additional Long Term Goals   Additional Long Term Goals Yes      PT LONG TERM GOAL #6   Title pt to be IND with all HEP and is able to maintain and progress current LOF IND    Time 6    Period Weeks    Status New    Target Date 10/06/20                 Plan - 09/12/20 1404    Clinical Impression Statement Pt responded well to all treatment today, reporting a decrease in pain following 1st MTP joint  mobilizations and cryotherapy. Although, she came in with increased pain compared to baseline, she leaves with 2/10 pain and reports understanding of how to complete her newly prescribed exercises. She will continue to benefit from skilled PT to address her primary impairments and help her return to her prior level of function without pain    Personal Factors and Comorbidities Age;Comorbidity 3+    Comorbidities hx of stroke, OA, HTN    Stability/Clinical Decision Making Evolving/Moderate complexity    Clinical Decision Making Moderate    Rehab Potential Good    PT Frequency 2x / week    PT Duration 6 weeks    PT Treatment/Interventions ADLs/Self Care Home Management;Moist Heat;Ultrasound;Cryotherapy;Gait training;Stair training;Functional mobility training;Therapeutic activities;Therapeutic exercise;Balance training;Neuromuscular re-education;Patient/family education;Manual techniques;Passive range of motion;Taping    PT Next Visit Plan Great toe ROM and LE strength, gait training, balance training    PT Home  Exercise Plan [id number]    Consulted and Agree with Plan of Care Patient           Patient will benefit from skilled therapeutic intervention in order to improve the following deficits and impairments:  Improper body mechanics,Increased muscle spasms,Decreased strength,Pain,Postural dysfunction  Visit Diagnosis: Pain in left foot  Other abnormalities of gait and mobility  Stiffness of left ankle, not elsewhere classified  Muscle weakness (generalized)     Problem List Patient Active Problem List   Diagnosis Date Noted  . Osteomyelitis of toe of right foot ([place])   . Wound dehiscence   . PICC (peripherally inserted central catheter) in place 11/23/2019  . Medication monitoring encounter 11/23/2019  . Osteomyelitis of ankle or foot, acute, right ([place]) 11/22/2019  . Superficial venous thrombosis of arm, right 11/02/2019  . Abscess of bursa of left foot 10/14/2019  . Foot abscess, left 10/14/2019  . Wound infection   . Unilateral primary osteoarthritis, left knee 04/27/2019  . Chondromalacia patellae, left knee 07/02/2016  . Arthritis of left knee 02/25/2016  . Vaginitis and vulvovaginitis, unspecified 08/08/2013  . Leiomyoma of uterus, unspecified 08/08/2013  . Atypical chest pain 03/13/2013  . Abnormal ECG 03/13/2013  . Obesity, unspecified 03/13/2013  . Hypertension     Vanessa Pitman, PT, DPT 09/12/20 2:08 PM   [facility] [address] [place], [name], [address] Phone: [phone]   Fax:  [phone]  Name: Alexis Ball MRN: [id number] Date of Birth: Aug 26, 1955

## 2020-09-12 NOTE — Patient Instructions (Signed)
   [id number]

## 2020-09-16 ENCOUNTER — Ambulatory Visit: Payer: Medicare Other | Admitting: Physical Therapy

## 2020-09-17 ENCOUNTER — Ambulatory Visit: Payer: Medicare Other

## 2020-09-17 ENCOUNTER — Other Ambulatory Visit: Payer: Self-pay

## 2020-09-17 DIAGNOSIS — M79672 Pain in left foot: Secondary | ICD-10-CM

## 2020-09-17 DIAGNOSIS — M25672 Stiffness of left ankle, not elsewhere classified: Secondary | ICD-10-CM

## 2020-09-17 DIAGNOSIS — M6281 Muscle weakness (generalized): Secondary | ICD-10-CM

## 2020-09-17 DIAGNOSIS — R2689 Other abnormalities of gait and mobility: Secondary | ICD-10-CM

## 2020-09-17 NOTE — Patient Instructions (Signed)
  [id number]

## 2020-09-17 NOTE — Therapy (Signed)
[place], [name], [address] Phone: [phone]   Fax:  [phone]  Physical Therapy Treatment  Patient Details  Name: Alexis Ball MRN: [id number] Date of Birth: [birth year] Referring Provider (PT): Hallux limitus of left foot (M20.5X2), Post-operative state (Z98.890)   Encounter Date: 09/17/2020   PT End of Session - 09/17/20 1204    Visit Number 5    Number of Visits 13    Date for PT Re-Evaluation 10/06/20    Authorization Type mcr: Kx mod at 15th visit  FOTO 6th at 10th    Progress Note Due on Visit 10    PT Start Time 1125    PT Stop Time 1210    PT Time Calculation (min) 45 min    Equipment Utilized During Treatment Gait belt    Activity Tolerance Patient tolerated treatment well;Patient limited by pain    Behavior During Therapy WFL for tasks assessed/performed           Past Medical History:  Diagnosis Date  . Allergic rhinitis   . Ambulates with cane   . Full dentures   . GERD (gastroesophageal reflux disease)    occasional , takes tums  . Glaucoma, both eyes   . Heart murmur    evalulated by cardiologist--- dr h. Johney Frame 01-07-2020 note in epic, echo done 01-31-2020 normal w/ no valve abnormalities  . History of cellulitis    2006  LLE  . History of chest pain    had cardiac cath 10-16-2004 (in epic)  showed normal coronaries that are tortous, patent renal arteries, normal LVF with ef 50-55%, noncardiac chest pain;  pt had ED visit 12-07-2019 negative work-up referred to cardiology, was seen by dr Johney Frame 01-07-2020 atypical cp / non-cardiac echo ordered / no further work-up  . History of CVA (cerebrovascular accident) without residual deficits    11-29-2019  per pt happened in 2007 and has not had cva/ tia sympomts since  . History of DVT of lower extremity    06-26-2020  per pt many years ago had RLE DVT completed blood thinner treatement stated not other blood clot until  07/ 2021 RUE with superficial venous thrombosis assosiated with PICC line which was removed  . History of osteomyelitis 10/2019   left foot  post op bunionectomy 06/ 2021  . Hypertension    followed by pcp  . IDA (iron deficiency anemia)   . Migraines    11-29-2019 per pt previously had botox injection for prevention, last had 3 yrs ago,  now uses breathing , meditation, and minimizes triggers (bright light/ loud noise)  . Neuropathy    feet  . OA (osteoarthritis)   . Vascular dementia ([place])    per pcp h&p 06-18-2020 with mild cognitive impairment take, namenda with improvement  . Vitamin D deficiency   . Wears glasses     Past Surgical History:  Procedure Laterality Date  . BONE BIOPSY Left 10/14/2019   Procedure: BONE BIOPSY PROXIMAL PHALANX;  Surgeon: Evelina Bucy, DPM;  Location: [facility];  Service: Podiatry;  Laterality: Left;  . BONE BIOPSY Left 04-30-2020  _0    x2  on left foot  . BONE BIOPSY Left 07/02/2020   Procedure: BONE BIOPSY - FROZEN SECTION;  Surgeon: Evelina Bucy, DPM;  Location: [facility];  Service: Podiatry;  Laterality: Left;  . BUNIONECTOMY Left 10-05-2019  dr Milinda Pointer _1    great toe  . CARDIAC CATHETERIZATION  10-16-2004  dr  ganji   normal coronaries (tortuous), normal LVF  . CATARACT EXTRACTION W/ INTRAOCULAR LENS  IMPLANT, BILATERAL  2013  . COLONOSCOPY  last one 04-23-2009  dr d. Sharlett Iles  . FOOT SURGERY Right unsure date   bunionectomy and hammertoe  . HARDWARE REMOVAL Right 06/ 2015  _0    right foot s/p bunionectomy/ hammertoe conrrection  . HARDWARE REMOVAL Left 12/05/2019   Procedure: HARDWARE REMOVAL ,WOUND CLOSURE OF FIRST TOE;  Surgeon: Evelina Bucy, DPM;  Location: [facility];  Service: Podiatry;  Laterality: Left;  . IMPLANT OF A SILASTIC METATARSAL PHALANGE JOINT Left 07/02/2020   Procedure: IMPLANT OF A SILASTIC METATARSAL PHALANGE JOINT WITH REMOVAL OF ANTIBIOTIC SPACER;CAPSULAR REPAIR WITH TISSUE  SUBSTITUTE;  Surgeon: Evelina Bucy, DPM;  Location: [facility];  Service: Podiatry;  Laterality: Left;  . IRRIGATION AND DEBRIDEMENT FOOT Left 10/14/2019   Procedure: INCISION AND DRAINAGE FOR LEFT FOOT INFECTION, COMPLEX;  Surgeon: Evelina Bucy, DPM;  Location: [facility];  Service: Podiatry;  Laterality: Left;  . IRRIGATION AND DEBRIDEMENT FOOT Left 10/17/2019   Procedure: Debridement and Irrigation of Left Foot; Removal of Silicone Implant; Insertion of Antibiotic Spacer; Application of External Fixator; Possible Application of Wound VAC;  Surgeon: Evelina Bucy, DPM;  Location: [facility];  Service: Podiatry;  Laterality: Left;  Pre-op Block POP/SAPH; MAC  . KNEE ARTHROSCOPY Left 2018  . ORIF CALCANEOUS FRACTURE Left 10/17/2019   Procedure: Application External Fixation;  Surgeon: Evelina Bucy, DPM;  Location: [facility];  Service: Podiatry;  Laterality: Left;    There were no vitals filed for this visit.   Subjective Assessment - 09/17/20 1120    Subjective Pt reports her ankle has been in increased pain with swelling since Sunday when she had to do a lot of walking and stairs at a graduation. She states that the pain is localized on a "knot" on the bottom of her foot under her 1st metatarsal head. She has been unable to be adherent to her HEP the past week due to her heightened sense of pain.    Limitations Standing;Walking    How long can you sit comfortably? 30 min    How long can you stand comfortably? 30 min    How long can you walk comfortably? 30 min with [facility]    Diagnostic tests 07/03/2020   Expected postoperative changes without complicating features    Patient Stated Goals balance better, standing and walk longer, reduce pain    Currently in Pain? Yes    Pain Score 5     Pain Location Foot    Pain Orientation Left    Pain Descriptors / Indicators Throbbing    Pain Type Surgical pain    Pain Onset More than a month ago    Pain Frequency Constant                              [facility] Adult PT Treatment/Exercise - 09/17/20 0001      Knee/Hip Exercises: Supine   Bridges with Clamshell --   2x10 with green theraband     Knee/Hip Exercises: Sidelying   Hip ABduction --   2x10 BIL     Cryotherapy   Number Minutes Cryotherapy 10 Minutes    Cryotherapy Location --   L ankle foot   Type of Cryotherapy Ice pack      Ankle Exercises: Standing   Other Standing Ankle Exercises Single  leg stance on Airex pad in // bars 3x30sec BIL    Other Standing Ankle Exercises Marching on Airex pad in // bars 3x30sec                  PT Education - 09/17/20 1203    Education Details Pt educated on proper form when performing newly added exercises.    Person(s) Educated Patient    Methods Explanation;Demonstration;Tactile cues;Verbal cues;Handout    Comprehension Tactile cues required;Verbal cues required;Returned demonstration;Verbalized understanding            PT Short Term Goals - 08/25/20 1232      PT SHORT TERM GOAL #1   Title pt to be IND with inital HEP    Time 3    Period Weeks    Status New    Target Date 09/22/20      PT SHORT TERM GOAL #2   Title assess berg balance and update goal    Time 1    Period Weeks    Status New    Target Date 09/01/20             PT Long Term Goals - 08/25/20 1233      PT LONG TERM GOAL #1   Title increase L ankle dF to >/= 6 degrees for ROM required for efficient gait pattern.    Time 6    Period Weeks    Status New    Target Date 10/06/20      PT LONG TERM GOAL #2   Title increase great toe extension to >/= 60 degrees AROM to promote toe off pattern of gait    Time 6    Period Weeks    Status New    Target Date 10/06/20      PT LONG TERM GOAL #3   Title increase gross ankle strength to >/= 5/5 in all planes to promote stability and maximize safety    Baseline -    Time 6    Period Weeks    Status New    Target Date 10/06/20      PT LONG TERM GOAL #4    Title increase FOTO score to >/= 57% to demo improvement in function    Time 6    Period Weeks    Status New    Target Date 10/06/20      PT LONG TERM GOAL #5   Title pt to be able to walk and stand for >/= 45 min with LRAD for endurnace required for in home and community amb with </= 2/10 max pain    Time 6    Period Weeks    Status New    Target Date 10/06/20      Additional Long Term Goals   Additional Long Term Goals Yes      PT LONG TERM GOAL #6   Title pt to be IND with all HEP and is able to maintain and progress current LOF IND    Time 6    Period Weeks    Status New    Target Date 10/06/20                 Plan - 09/17/20 1204    Clinical Impression Statement Pt responded well to all treatment today, reporting a decrease in pain following Airex pad exercises and cryotherapy. Although she came in with increased pain following prolonged standing and walking over the weekend, she leaves with 2/10 pain and reports understanding  of how to complete her newly prescribed exercises. She will continue to benefit from skilled PT to address her primary impairments and help her return to her prior level of function without pain.    Personal Factors and Comorbidities Age;Comorbidity 3+    Comorbidities hx of stroke, OA, HTN    Stability/Clinical Decision Making Evolving/Moderate complexity    Clinical Decision Making Moderate    Rehab Potential Good    PT Frequency 2x / week    PT Duration 6 weeks    PT Treatment/Interventions ADLs/Self Care Home Management;Moist Heat;Ultrasound;Cryotherapy;Gait training;Stair training;Functional mobility training;Therapeutic activities;Therapeutic exercise;Balance training;Neuromuscular re-education;Patient/family education;Manual techniques;Passive range of motion;Taping    PT Next Visit Plan Great toe ROM and LE strength, gait training, balance training    PT Home Exercise Plan [id number]    Consulted and Agree with Plan of Care Patient            Patient will benefit from skilled therapeutic intervention in order to improve the following deficits and impairments:  Improper body mechanics,Increased muscle spasms,Decreased strength,Pain,Postural dysfunction  Visit Diagnosis: Pain in left foot  Other abnormalities of gait and mobility  Stiffness of left ankle, not elsewhere classified  Muscle weakness (generalized)     Problem List Patient Active Problem List   Diagnosis Date Noted  . Osteomyelitis of toe of right foot ([place])   . Wound dehiscence   . PICC (peripherally inserted central catheter) in place 11/23/2019  . Medication monitoring encounter 11/23/2019  . Osteomyelitis of ankle or foot, acute, right ([place]) 11/22/2019  . Superficial venous thrombosis of arm, right 11/02/2019  . Abscess of bursa of left foot 10/14/2019  . Foot abscess, left 10/14/2019  . Wound infection   . Unilateral primary osteoarthritis, left knee 04/27/2019  . Chondromalacia patellae, left knee 07/02/2016  . Arthritis of left knee 02/25/2016  . Vaginitis and vulvovaginitis, unspecified 08/08/2013  . Leiomyoma of uterus, unspecified 08/08/2013  . Atypical chest pain 03/13/2013  . Abnormal ECG 03/13/2013  . Obesity, unspecified 03/13/2013  . Hypertension     Vanessa [place], PT, DPT 09/17/20 12:10 PM   [facility] [address] [place], [name], [address] Phone: [phone]   Fax:  [phone]  Name: Alexis Ball MRN: [id number] Date of Birth: 1955/08/14

## 2020-09-19 ENCOUNTER — Ambulatory Visit: Payer: Medicare Other | Admitting: Physical Therapy

## 2020-09-19 ENCOUNTER — Encounter: Payer: Self-pay | Admitting: Physical Therapy

## 2020-09-19 ENCOUNTER — Other Ambulatory Visit: Payer: Self-pay

## 2020-09-19 DIAGNOSIS — M79672 Pain in left foot: Secondary | ICD-10-CM

## 2020-09-19 DIAGNOSIS — M6281 Muscle weakness (generalized): Secondary | ICD-10-CM

## 2020-09-19 DIAGNOSIS — M25672 Stiffness of left ankle, not elsewhere classified: Secondary | ICD-10-CM

## 2020-09-19 DIAGNOSIS — R2689 Other abnormalities of gait and mobility: Secondary | ICD-10-CM

## 2020-09-19 NOTE — Therapy (Signed)
[name], [name], [address] Phone: [phone]   Fax:  [phone]  Physical Therapy Treatment  Patient Details  Name: Alexis Ball MRN: [id number] Date of Birth: Sep 08, 1955 Referring Provider (PT): Hallux limitus of left foot (M20.5X2), Post-operative state (Z98.890)   Encounter Date: 09/19/2020   PT End of Session - 09/19/20 1106     Visit Number 6    Number of Visits 13    Date for PT Re-Evaluation 10/06/20    Authorization Type mcr: Kx mod at 15th visit  FOTO 6th at 10th    Progress Note Due on Visit 10    PT Start Time 1103    PT Stop Time 1153    PT Time Calculation (min) 50 min    Activity Tolerance Patient tolerated treatment well;Patient limited by pain    Behavior During Therapy [facility] for tasks assessed/performed             Past Medical History:  Diagnosis Date   Allergic rhinitis    Ambulates with cane    Full dentures    GERD (gastroesophageal reflux disease)    occasional , takes tums   Glaucoma, both eyes    Heart murmur    evalulated by cardiologist--- dr h. Johney Frame 01-07-2020 note in epic, echo done 01-31-2020 normal w/ no valve abnormalities   History of cellulitis    2006  LLE   History of chest pain    had cardiac cath 10-16-2004 (in epic)  showed normal coronaries that are tortous, patent renal arteries, normal LVF with ef 50-55%, noncardiac chest pain;  pt had ED visit 12-07-2019 negative work-up referred to cardiology, was seen by dr Johney Frame 01-07-2020 atypical cp / non-cardiac echo ordered / no further work-up   History of CVA (cerebrovascular accident) without residual deficits    11-29-2019  per pt happened in 2007 and has not had cva/ tia sympomts since   History of DVT of lower extremity    06-26-2020  per pt many years ago had RLE DVT completed blood thinner treatement stated not other blood clot until 07/ 2021 RUE with superficial venous thrombosis  assosiated with PICC line which was removed   History of osteomyelitis 10/2019   left foot  post op bunionectomy 06/ 2021   Hypertension    followed by pcp   IDA (iron deficiency anemia)    Migraines    11-29-2019 per pt previously had botox injection for prevention, last had 3 yrs ago,  now uses breathing , meditation, and minimizes triggers (bright light/ loud noise)   Neuropathy    feet   OA (osteoarthritis)    Vascular dementia ([place])    per pcp h&p 06-18-2020 with mild cognitive impairment take, namenda with improvement   Vitamin D deficiency    Wears glasses     Past Surgical History:  Procedure Laterality Date   BONE BIOPSY Left 10/14/2019   Procedure: BONE BIOPSY PROXIMAL PHALANX;  Surgeon: Evelina Bucy, DPM;  Location: [place];  Service: Podiatry;  Laterality: Left;   BONE BIOPSY Left 04-30-2020  _0    x2  on left foot   BONE BIOPSY Left 07/02/2020   Procedure: BONE BIOPSY - FROZEN SECTION;  Surgeon: Evelina Bucy, DPM;  Location: [place];  Service: Podiatry;  Laterality: Left;   BUNIONECTOMY Left 10-05-2019  dr Milinda Pointer _1    great toe   CARDIAC CATHETERIZATION  10-16-2004  dr Einar Gip   normal coronaries (tortuous),  normal LVF   CATARACT EXTRACTION W/ INTRAOCULAR LENS  IMPLANT, BILATERAL  2013   COLONOSCOPY  last one 04-23-2009  dr d. Sharlett Iles   FOOT SURGERY Right unsure date   bunionectomy and hammertoe   HARDWARE REMOVAL Right 06/ 2015  _0    right foot s/p bunionectomy/ hammertoe conrrection   HARDWARE REMOVAL Left 12/05/2019   Procedure: HARDWARE REMOVAL ,WOUND CLOSURE OF FIRST TOE;  Surgeon: Evelina Bucy, DPM;  Location: [place];  Service: Podiatry;  Laterality: Left;   IMPLANT OF A SILASTIC METATARSAL PHALANGE JOINT Left 07/02/2020   Procedure: IMPLANT OF A SILASTIC METATARSAL PHALANGE JOINT WITH REMOVAL OF ANTIBIOTIC SPACER;CAPSULAR REPAIR WITH TISSUE SUBSTITUTE;  Surgeon: Evelina Bucy, DPM;  Location: [place];  Service: Podiatry;  Laterality: Left;   IRRIGATION AND DEBRIDEMENT FOOT Left 10/14/2019   Procedure: INCISION AND DRAINAGE FOR LEFT FOOT INFECTION, COMPLEX;  Surgeon: Evelina Bucy, DPM;  Location: [place];  Service: Podiatry;  Laterality: Left;   IRRIGATION AND DEBRIDEMENT FOOT Left 10/17/2019   Procedure: Debridement and Irrigation of Left Foot; Removal of Silicone Implant; Insertion of Antibiotic Spacer; Application of External Fixator; Possible Application of Wound VAC;  Surgeon: Evelina Bucy, DPM;  Location: [place];  Service: Podiatry;  Laterality: Left;  Pre-op Block POP/SAPH; MAC   KNEE ARTHROSCOPY Left 2018   ORIF CALCANEOUS FRACTURE Left 10/17/2019   Procedure: Application External Fixation;  Surgeon: Evelina Bucy, DPM;  Location: [place];  Service: Podiatry;  Laterality: Left;    There were no vitals filed for this visit.   Subjective Assessment - 09/19/20 1109     Subjective "I feel like I am getting stronger, Berline Lopes has been really working me. today I do feel like I am having some soreness with swelling in my toe."    Diagnostic tests 07/03/2020   Expected postoperative changes without complicating features    Currently in Pain? Yes    Pain Score 8     Pain Location Foot    Pain Orientation Left    Pain Descriptors / Indicators Aching;Throbbing;Spasm    Pain Type Surgical pain    Pain Onset More than a month ago    Pain Frequency Intermittent    Aggravating Factors  sitting for long periods, not elevating the foot    Pain Relieving Factors heat, ice                [facility] PT Assessment - 09/19/20 0001       Assessment   Medical Diagnosis Evelina Bucy, DPM    Referring Provider (PT) Hallux limitus of left foot (M20.5X2), Post-operative state (Z98.890)      Observation/Other Assessments   Focus on Therapeutic Outcomes (FOTO)  47%      Standardized Balance Assessment   Standardized Balance Assessment Berg Balance Test      Berg Balance Test    Sit to Stand Able to stand without using hands and stabilize independently    Standing Unsupported Able to stand safely 2 minutes    Sitting with Back Unsupported but Feet Supported on Floor or Stool Able to sit safely and securely 2 minutes    Stand to Sit Sits safely with minimal use of hands    Transfers Able to transfer safely, definite need of hands    Standing Unsupported with Eyes Closed Able to stand 10 seconds safely    Standing Unsupported with Feet Together Able to place feet together independently and stand  1 minute safely    From Standing, Reach Forward with Outstretched Arm Can reach forward >12 cm safely (5")    From Standing Position, Pick up Object from [place] to pick up shoe safely and easily    From Standing Position, Turn to Look Behind Over each Shoulder Looks behind from both sides and weight shifts well    Turn 360 Degrees Able to turn 360 degrees safely in 4 seconds or less    Standing Unsupported, Alternately Place Feet on Step/Stool Able to stand independently and safely and complete 8 steps in 20 seconds    Standing Unsupported, One Foot in Front Able to plae foot ahead of the other independently and hold 30 seconds    Standing on One Leg Able to lift leg independently and hold 5-10 seconds    Total Score 52                           [facility] Adult PT Treatment/Exercise - 09/19/20 0001       Knee/Hip Exercises: Aerobic   Nustep L4 x 43mn LE only      Knee/Hip Exercises: Standing   Gait Training forward/ retro gait in // 4 x ea. using mirror for visual feedback      Cryotherapy   Number Minutes Cryotherapy 10 Minutes    Cryotherapy Location Ankle   supine with elevation   Type of Cryotherapy Ice pack      Ankle Exercises: Stretches   Slant Board Stretch 2 reps;30 seconds   gastroc/ soleus     Ankle Exercises: Seated   Heel Raises 15 reps;Left   x 2                   PT Education - 09/19/20 1108     Education Details  reviewed 6th visit FOTO assessment.    Person(s) Educated Patient    Methods Explanation;Verbal cues    Comprehension Verbalized understanding;Verbal cues required              PT Short Term Goals - 08/25/20 1232       PT SHORT TERM GOAL #1   Title pt to be IND with inital HEP    Time 3    Period Weeks    Status New    Target Date 09/22/20      PT SHORT TERM GOAL #2   Title assess berg balance and update goal    Time 1    Period Weeks    Status New    Target Date 09/01/20               PT Long Term Goals - 08/25/20 1233       PT LONG TERM GOAL #1   Title increase L ankle dF to >/= 6 degrees for ROM required for efficient gait pattern.    Time 6    Period Weeks    Status New    Target Date 10/06/20      PT LONG TERM GOAL #2   Title increase great toe extension to >/= 60 degrees AROM to promote toe off pattern of gait    Time 6    Period Weeks    Status New    Target Date 10/06/20      PT LONG TERM GOAL #3   Title increase gross ankle strength to >/= 5/5 in all planes to promote stability and maximize safety    Baseline -  Time 6    Period Weeks    Status New    Target Date 10/06/20      PT LONG TERM GOAL #4   Title increase FOTO score to >/= 57% to demo improvement in function    Time 6    Period Weeks    Status New    Target Date 10/06/20      PT LONG TERM GOAL #5   Title pt to be able to walk and stand for >/= 45 min with LRAD for endurnace required for in home and community amb with </= 2/10 max pain    Time 6    Period Weeks    Status New    Target Date 10/06/20      Additional Long Term Goals   Additional Long Term Goals Yes      PT LONG TERM GOAL #6   Title pt to be IND with all HEP and is able to maintain and progress current LOF IND    Time 6    Period Weeks    Status New    Target Date 10/06/20                   Plan - 09/19/20 1115     Clinical Impression Statement pt arrives to session today reporting  increased pain rated at 8/10 today which is an increase compared to previous session which ws a 5/10. Reassessed FOTO which as dropped from a 30 to a 47% which she notes is probably because she is trying to do too much. today assessed berg balance which she measured 52/56. Focused session on gait training utilizing heel strike/ toe off with which she did required demosntration and visual cues for proper form and to incorporate reciprocal arm swing. she did well with stepping up/ down on step which she did well with leading with LLE/ RLE. plan to continue with 1 x a week for the next 4 weeks to work on gross LE strength and functional mobility and endurance.    PT Treatment/Interventions ADLs/Self Care Home Management;Moist Heat;Ultrasound;Cryotherapy;Gait training;Stair training;Functional mobility training;Therapeutic activities;Therapeutic exercise;Balance training;Neuromuscular re-education;Patient/family education;Manual techniques;Passive range of motion;Taping    PT Next Visit Plan Great toe ROM and LE strength, gait training, balance training    PT Home Exercise Plan [id number]             Patient will benefit from skilled therapeutic intervention in order to improve the following deficits and impairments:  Improper body mechanics, Increased muscle spasms, Decreased strength, Pain, Postural dysfunction  Visit Diagnosis: Pain in left foot  Other abnormalities of gait and mobility  Stiffness of left ankle, not elsewhere classified  Muscle weakness (generalized)     Problem List Patient Active Problem List   Diagnosis Date Noted   Osteomyelitis of toe of right foot ([place])    Wound dehiscence    PICC (peripherally inserted central catheter) in place 11/23/2019   Medication monitoring encounter 11/23/2019   Osteomyelitis of ankle or foot, acute, right ([place]) 11/22/2019   Superficial venous thrombosis of arm, right 11/02/2019   Abscess of bursa of left foot 10/14/2019   Foot abscess,  left 10/14/2019   Wound infection    Unilateral primary osteoarthritis, left knee 04/27/2019   Chondromalacia patellae, left knee 07/02/2016   Arthritis of left knee 02/25/2016   Vaginitis and vulvovaginitis, unspecified 08/08/2013   Leiomyoma of uterus, unspecified 08/08/2013   Atypical chest pain 03/13/2013   Abnormal ECG 03/13/2013   Obesity, unspecified  03/13/2013   Hypertension    Merrie Epler Balinda Quails PT, DPT, LAT, ATC  09/19/20  12:00 PM      [facility] [address] [place], [name], [address] Phone: [phone]   Fax:  [phone]  Name: Alexis Ball MRN: [id number] Date of Birth: [birth year]

## 2020-09-23 ENCOUNTER — Encounter: Payer: Self-pay | Admitting: Podiatry

## 2020-09-23 ENCOUNTER — Ambulatory Visit (INDEPENDENT_AMBULATORY_CARE_PROVIDER_SITE_OTHER): Payer: Medicare Other | Admitting: Podiatry

## 2020-09-23 ENCOUNTER — Other Ambulatory Visit: Payer: Self-pay

## 2020-09-23 DIAGNOSIS — L84 Corns and callosities: Secondary | ICD-10-CM

## 2020-09-23 DIAGNOSIS — M79675 Pain in left toe(s): Secondary | ICD-10-CM

## 2020-09-23 DIAGNOSIS — M79674 Pain in right toe(s): Secondary | ICD-10-CM

## 2020-09-23 DIAGNOSIS — G629 Polyneuropathy, unspecified: Secondary | ICD-10-CM | POA: Diagnosis not present

## 2020-09-23 DIAGNOSIS — B351 Tinea unguium: Secondary | ICD-10-CM

## 2020-09-24 ENCOUNTER — Encounter: Payer: Self-pay | Admitting: Physical Therapy

## 2020-09-24 ENCOUNTER — Ambulatory Visit: Payer: Medicare Other | Admitting: Physical Therapy

## 2020-09-24 DIAGNOSIS — M25672 Stiffness of left ankle, not elsewhere classified: Secondary | ICD-10-CM

## 2020-09-24 DIAGNOSIS — M79672 Pain in left foot: Secondary | ICD-10-CM | POA: Diagnosis not present

## 2020-09-24 DIAGNOSIS — M6281 Muscle weakness (generalized): Secondary | ICD-10-CM

## 2020-09-24 DIAGNOSIS — R2689 Other abnormalities of gait and mobility: Secondary | ICD-10-CM

## 2020-09-24 NOTE — Therapy (Signed)
[place], [name], [address] Phone: [phone]   Fax:  [phone]  Physical Therapy Treatment  Patient Details  Name: Alexis Ball MRN: [id number] Date of Birth: 1955-11-30 Referring Provider (PT): Evelina Bucy, DPM   Encounter Date: 09/24/2020   PT End of Session - 09/24/20 0935     Visit Number 7    Number of Visits 13    Date for PT Re-Evaluation 10/06/20    Authorization Type mcr: Kx mod at 15th visit  FOTO 6th at 10th    Progress Note Due on Visit 10    PT Start Time 0935    PT Stop Time 6767    PT Time Calculation (min) 39 min    Activity Tolerance Patient tolerated treatment well;Patient limited by pain    Behavior During Therapy [facility] for tasks assessed/performed             Past Medical History:  Diagnosis Date   Allergic rhinitis    Ambulates with cane    Full dentures    GERD (gastroesophageal reflux disease)    occasional , takes tums   Glaucoma, both eyes    Heart murmur    evalulated by cardiologist--- dr h. Johney Frame 01-07-2020 note in epic, echo done 01-31-2020 normal w/ no valve abnormalities   History of cellulitis    2006  LLE   History of chest pain    had cardiac cath 10-16-2004 (in epic)  showed normal coronaries that are tortous, patent renal arteries, normal LVF with ef 50-55%, noncardiac chest pain;  pt had ED visit 12-07-2019 negative work-up referred to cardiology, was seen by dr Johney Frame 01-07-2020 atypical cp / non-cardiac echo ordered / no further work-up   History of CVA (cerebrovascular accident) without residual deficits    11-29-2019  per pt happened in 2007 and has not had cva/ tia sympomts since   History of DVT of lower extremity    06-26-2020  per pt many years ago had RLE DVT completed blood thinner treatement stated not other blood clot until 07/ 2021 RUE with superficial venous thrombosis assosiated with PICC line which was removed   History  of osteomyelitis 10/2019   left foot  post op bunionectomy 06/ 2021   Hypertension    followed by pcp   IDA (iron deficiency anemia)    Migraines    11-29-2019 per pt previously had botox injection for prevention, last had 3 yrs ago,  now uses breathing , meditation, and minimizes triggers (bright light/ loud noise)   Neuropathy    feet   OA (osteoarthritis)    Vascular dementia ([place])    per pcp h&p 06-18-2020 with mild cognitive impairment take, namenda with improvement   Vitamin D deficiency    Wears glasses     Past Surgical History:  Procedure Laterality Date   BONE BIOPSY Left 10/14/2019   Procedure: BONE BIOPSY PROXIMAL PHALANX;  Surgeon: Evelina Bucy, DPM;  Location: [place];  Service: Podiatry;  Laterality: Left;   BONE BIOPSY Left 04-30-2020  _0    x2  on left foot   BONE BIOPSY Left 07/02/2020   Procedure: BONE BIOPSY - FROZEN SECTION;  Surgeon: Evelina Bucy, DPM;  Location: [place];  Service: Podiatry;  Laterality: Left;   BUNIONECTOMY Left 10-05-2019  dr Milinda Pointer _1    great toe   CARDIAC CATHETERIZATION  10-16-2004  dr Einar Gip   normal coronaries (tortuous), normal LVF   CATARACT  EXTRACTION W/ INTRAOCULAR LENS  IMPLANT, BILATERAL  2013   COLONOSCOPY  last one 04-23-2009  dr d. Sharlett Iles   FOOT SURGERY Right unsure date   bunionectomy and hammertoe   HARDWARE REMOVAL Right 06/ 2015  _0    right foot s/p bunionectomy/ hammertoe conrrection   HARDWARE REMOVAL Left 12/05/2019   Procedure: HARDWARE REMOVAL ,WOUND CLOSURE OF FIRST TOE;  Surgeon: Evelina Bucy, DPM;  Location: [place];  Service: Podiatry;  Laterality: Left;   IMPLANT OF A SILASTIC METATARSAL PHALANGE JOINT Left 07/02/2020   Procedure: IMPLANT OF A SILASTIC METATARSAL PHALANGE JOINT WITH REMOVAL OF ANTIBIOTIC SPACER;CAPSULAR REPAIR WITH TISSUE SUBSTITUTE;  Surgeon: Evelina Bucy, DPM;  Location: [place];  Service: Podiatry;  Laterality: Left;    IRRIGATION AND DEBRIDEMENT FOOT Left 10/14/2019   Procedure: INCISION AND DRAINAGE FOR LEFT FOOT INFECTION, COMPLEX;  Surgeon: Evelina Bucy, DPM;  Location: [place];  Service: Podiatry;  Laterality: Left;   IRRIGATION AND DEBRIDEMENT FOOT Left 10/17/2019   Procedure: Debridement and Irrigation of Left Foot; Removal of Silicone Implant; Insertion of Antibiotic Spacer; Application of External Fixator; Possible Application of Wound VAC;  Surgeon: Evelina Bucy, DPM;  Location: [place];  Service: Podiatry;  Laterality: Left;  Pre-op Block POP/SAPH; MAC   KNEE ARTHROSCOPY Left 2018   ORIF CALCANEOUS FRACTURE Left 10/17/2019   Procedure: Application External Fixation;  Surgeon: Evelina Bucy, DPM;  Location: [place];  Service: Podiatry;  Laterality: Left;    There were no vitals filed for this visit.   Subjective Assessment - 09/24/20 0935     Subjective "I saw the podiatris and they said the toe being shortened the tissue is working and adjusting."    Currently in Pain? Yes    Pain Score 5    at worst 8/10   Pain Orientation Left    Pain Descriptors / Indicators Aching    Pain Type Surgical pain                [facility] PT Assessment - 09/24/20 0001       Assessment   Medical Diagnosis Hallux limitus of left foot (M20.5X2), Post-operative state (H90.931)    Referring Provider (PT) Evelina Bucy, DPM                           [facility] Adult PT Treatment/Exercise - 09/24/20 0001       Knee/Hip Exercises: Aerobic   Nustep L5 x 5 min LE only      Knee/Hip Exercises: Standing   Gait Training heel strike/ toe off taking with shorted cadence bil to using SPC in RUE 2 x 100 ft   demonstrated improvement gait efficency.     Manual Therapy   Joint Mobilization L 1st MTP AP/PA grade 4 mobilizations      Ankle Exercises: Stretches   Other Stretch hallucis flexor longus stretch LLE only 2 x 30 sec                 Balance Exercises - 09/24/20 0001       Balance  Exercises: Standing   Rockerboard Anterior/posterior;EO   in //, 1 x 20 with bil UE, 1 x 20 RUE only, 1 x 20 no UE assist   Marching Foam/compliant surface   2 x 25 reps in //. cues to work on controlled descent   Heel Raises Both;15 reps   x 3 standing on  airex pad in //                PT Short Term Goals - 08/25/20 1232       PT SHORT TERM GOAL #1   Title pt to be IND with inital HEP    Time 3    Period Weeks    Status New    Target Date 09/22/20      PT SHORT TERM GOAL #2   Title assess berg balance and update goal    Time 1    Period Weeks    Status New    Target Date 09/01/20               PT Long Term Goals - 08/25/20 1233       PT LONG TERM GOAL #1   Title increase L ankle dF to >/= 6 degrees for ROM required for efficient gait pattern.    Time 6    Period Weeks    Status New    Target Date 10/06/20      PT LONG TERM GOAL #2   Title increase great toe extension to >/= 60 degrees AROM to promote toe off pattern of gait    Time 6    Period Weeks    Status New    Target Date 10/06/20      PT LONG TERM GOAL #3   Title increase gross ankle strength to >/= 5/5 in all planes to promote stability and maximize safety    Baseline -    Time 6    Period Weeks    Status New    Target Date 10/06/20      PT LONG TERM GOAL #4   Title increase FOTO score to >/= 57% to demo improvement in function    Time 6    Period Weeks    Status New    Target Date 10/06/20      PT LONG TERM GOAL #5   Title pt to be able to walk and stand for >/= 45 min with LRAD for endurnace required for in home and community amb with </= 2/10 max pain    Time 6    Period Weeks    Status New    Target Date 10/06/20      Additional Long Term Goals   Additional Long Term Goals Yes      PT LONG TERM GOAL #6   Title pt to be IND with all HEP and is able to maintain and progress current LOF IND    Time 6    Period Weeks    Status New    Target Date 10/06/20                    Plan - 09/24/20 1015     Clinical Impression Statement pt notes she saw the podiatrist noting her sorness is normal due based on her great toe being shorter and more empahsis/ use of the 2nd digiit. continued intertarsal glides and gentle hallucis flexor longus stretching, continued working on intrinsic strengthening. she did well with dynamic balance/ strengthening in the // for safety PRN. she does continue to require cues for proper gait pattern to which improves with shortened step length bil with SPC in the RUE.    PT Treatment/Interventions ADLs/Self Care Home Management;Moist Heat;Ultrasound;Cryotherapy;Gait training;Stair training;Functional mobility training;Therapeutic activities;Therapeutic exercise;Balance training;Neuromuscular re-education;Patient/family education;Manual techniques;Passive range of motion;Taping    PT Next Visit Plan Great toe ROM and LE  strength, gait training, balance training    PT Home Exercise Plan [id number]    Consulted and Agree with Plan of Care Patient             Patient will benefit from skilled therapeutic intervention in order to improve the following deficits and impairments:  Improper body mechanics, Increased muscle spasms, Decreased strength, Pain, Postural dysfunction  Visit Diagnosis: Pain in left foot  Other abnormalities of gait and mobility  Stiffness of left ankle, not elsewhere classified  Muscle weakness (generalized)     Problem List Patient Active Problem List   Diagnosis Date Noted   Osteomyelitis of toe of right foot ([place])    Wound dehiscence    PICC (peripherally inserted central catheter) in place 11/23/2019   Medication monitoring encounter 11/23/2019   Osteomyelitis of ankle or foot, acute, right ([place]) 11/22/2019   Superficial venous thrombosis of arm, right 11/02/2019   Abscess of bursa of left foot 10/14/2019   Foot abscess, left 10/14/2019   Wound infection    Unilateral primary osteoarthritis,  left knee 04/27/2019   Chondromalacia patellae, left knee 07/02/2016   Arthritis of left knee 02/25/2016   Vaginitis and vulvovaginitis, unspecified 08/08/2013   Leiomyoma of uterus, unspecified 08/08/2013   Atypical chest pain 03/13/2013   Abnormal ECG 03/13/2013   Obesity, unspecified 03/13/2013   Hypertension    Chandi Nicklin PT, DPT, LAT, ATC  09/24/20  10:18 AM     [facility] [address] [place], [name], [address] Phone: [phone]   Fax:  [phone]  Name: Alexis Ball MRN: [id number] Date of Birth: 10-22-1955

## 2020-09-29 NOTE — Progress Notes (Signed)
  Subjective:  Patient ID: Alexis Ball, female    DOB: September 30, 1955,  MRN: [id number]  65 y.o. female presents with at risk foot care with history of peripheral neuropathy and callus(es) b/l feet and painful thick toenails that are difficult to trim. Painful toenails interfere with ambulation. Aggravating factors include wearing enclosed shoe gear. Pain is relieved with periodic professional debridement. Painful calluses are aggravated when weightbearing with and without shoegear. Pain is relieved with periodic professional debridement.Marland Kitchen    PCP: Arthur Holms, NP and last visit was: April, 2022.  Review of Systems: Negative except as noted in the HPI.   Allergies  Allergen Reactions   Iodine Itching    Per pt when put on skin    Objective:  There were no vitals filed for this visit. Constitutional Patient is a pleasant 65 y.o. African American female morbidly obese in NAD. AAO x 3.  Vascular Capillary refill time to digits immediate b/l. Palpable pedal pulses b/l LE. Pedal hair sparse. Lower extremity skin temperature gradient within normal limits. No pain with calf compression b/l. No cyanosis or clubbing noted.  Neurologic Normal speech. Pt has subjective symptoms of neuropathy. Protective sensation intact 5/5 intact bilaterally with 10g monofilament b/l. Vibratory sensation intact b/l.  Dermatologic Pedal skin with normal turgor, texture and tone bilaterally. No open wounds bilaterally. No interdigital macerations bilaterally. Toenails 1-5 b/l elongated, discolored, dystrophic, thickened, crumbly with subungual debris and tenderness to dorsal palpation. Hyperkeratotic lesion(s) R hallux, submet head 1 right foot, submet head 5 left foot, and submet head 5 right foot.  No erythema, no edema, no drainage, no fluctuance.  Orthopedic: Normal muscle strength 5/5 to all lower extremity muscle groups bilaterally. No pain crepitus or joint limitation noted with ROM b/l. Hammertoe(s) noted to the L  2nd toe, L 3rd toe, and L 4th toe.   Hemoglobin A1C Latest Ref Rng & Units 01/31/2020  HGBA1C 4.8 - 5.6 % 5.7(H)  Some recent data might be hidden   Assessment:   1. Pain due to onychomycosis of toenails of both feet   2. Callus   3. Neuropathy    Plan:  Patient was evaluated and treated and all questions answered.  Onychomycosis with pain -Nails palliatively debridement as below. -Educated on self-care  Procedure: Nail Debridement Rationale: Pain Type of Debridement: manual, sharp debridement. Instrumentation: Nail nipper, rotary burr. Number of Nails: 10  -Examined patient. -Patient to continue soft, supportive shoe gear daily. -Toenails 1-5 b/l were debrided in length and girth with sterile nail nippers and dremel without iatrogenic bleeding.  -Callus(es) R hallux, submet head 1 right foot, submet head 5 left foot, and submet head 5 right foot pared utilizing sterile scalpel blade without complication or incident. Total number debrided =4. -Patient to report any pedal injuries to medical professional immediately. -Patient/POA to call should there be question/concern in the interim.  Return in about 3 months (around 12/24/2020).  Marzetta Board, DPM

## 2020-10-01 ENCOUNTER — Ambulatory Visit: Payer: Medicare Other

## 2020-10-01 ENCOUNTER — Other Ambulatory Visit: Payer: Self-pay

## 2020-10-01 DIAGNOSIS — M25672 Stiffness of left ankle, not elsewhere classified: Secondary | ICD-10-CM

## 2020-10-01 DIAGNOSIS — R2689 Other abnormalities of gait and mobility: Secondary | ICD-10-CM

## 2020-10-01 DIAGNOSIS — M79672 Pain in left foot: Secondary | ICD-10-CM

## 2020-10-01 DIAGNOSIS — M6281 Muscle weakness (generalized): Secondary | ICD-10-CM

## 2020-10-01 NOTE — Therapy (Signed)
[facility] [place], [name], [address] Phone: [phone]   Fax:  [phone]  Physical Therapy Treatment  Patient Details  Name: Alexis Ball MRN: [id number] Date of Birth: [birth year] Referring Provider (PT): Evelina Bucy, DPM   Encounter Date: 10/01/2020   PT End of Session - 10/01/20 1214     Visit Number 8    Number of Visits 13    Date for PT Re-Evaluation 10/06/20    Authorization Type mcr: Kx mod at 15th visit  FOTO 6th at 10th    Progress Note Due on Visit 10    PT Start Time 1130    PT Stop Time 1215    PT Time Calculation (min) 45 min    Equipment Utilized During Treatment Gait belt    Activity Tolerance Patient tolerated treatment well;Patient limited by pain    Behavior During Therapy WFL for tasks assessed/performed             Past Medical History:  Diagnosis Date   Allergic rhinitis    Ambulates with cane    Full dentures    GERD (gastroesophageal reflux disease)    occasional , takes tums   Glaucoma, both eyes    Heart murmur    evalulated by cardiologist--- dr h. Johney Frame 01-07-2020 note in epic, echo done 01-31-2020 normal w/ no valve abnormalities   History of cellulitis    2006  LLE   History of chest pain    had cardiac cath 10-16-2004 (in epic)  showed normal coronaries that are tortous, patent renal arteries, normal LVF with ef 50-55%, noncardiac chest pain;  pt had ED visit 12-07-2019 negative work-up referred to cardiology, was seen by dr Johney Frame 01-07-2020 atypical cp / non-cardiac echo ordered / no further work-up   History of CVA (cerebrovascular accident) without residual deficits    11-29-2019  per pt happened in 2007 and has not had cva/ tia sympomts since   History of DVT of lower extremity    06-26-2020  per pt many years ago had RLE DVT completed blood thinner treatement stated not other blood clot until 07/ 2021 RUE with superficial venous thrombosis  assosiated with PICC line which was removed   History of osteomyelitis 10/2019   left foot  post op bunionectomy 06/ 2021   Hypertension    followed by pcp   IDA (iron deficiency anemia)    Migraines    11-29-2019 per pt previously had botox injection for prevention, last had 3 yrs ago,  now uses breathing , meditation, and minimizes triggers (bright light/ loud noise)   Neuropathy    feet   OA (osteoarthritis)    Vascular dementia ([place])    per pcp h&p 06-18-2020 with mild cognitive impairment take, namenda with improvement   Vitamin D deficiency    Wears glasses     Past Surgical History:  Procedure Laterality Date   BONE BIOPSY Left 10/14/2019   Procedure: BONE BIOPSY PROXIMAL PHALANX;  Surgeon: Evelina Bucy, DPM;  Location: [facility];  Service: Podiatry;  Laterality: Left;   BONE BIOPSY Left 04-30-2020  _0    x2  on left foot   BONE BIOPSY Left 07/02/2020   Procedure: BONE BIOPSY - FROZEN SECTION;  Surgeon: Evelina Bucy, DPM;  Location: [facility];  Service: Podiatry;  Laterality: Left;   BUNIONECTOMY Left 10-05-2019  dr Milinda Pointer _1    great toe   CARDIAC CATHETERIZATION  10-16-2004  dr Einar Gip  normal coronaries (tortuous), normal LVF   CATARACT EXTRACTION W/ INTRAOCULAR LENS  IMPLANT, BILATERAL  2013   COLONOSCOPY  last one 04-23-2009  dr d. Sharlett Iles   FOOT SURGERY Right unsure date   bunionectomy and hammertoe   HARDWARE REMOVAL Right 06/ 2015  _0    right foot s/p bunionectomy/ hammertoe conrrection   HARDWARE REMOVAL Left 12/05/2019   Procedure: HARDWARE REMOVAL ,WOUND CLOSURE OF FIRST TOE;  Surgeon: Evelina Bucy, DPM;  Location: [facility];  Service: Podiatry;  Laterality: Left;   IMPLANT OF A SILASTIC METATARSAL PHALANGE JOINT Left 07/02/2020   Procedure: IMPLANT OF A SILASTIC METATARSAL PHALANGE JOINT WITH REMOVAL OF ANTIBIOTIC SPACER;CAPSULAR REPAIR WITH TISSUE SUBSTITUTE;  Surgeon: Evelina Bucy, DPM;  Location: [facility];  Service: Podiatry;  Laterality: Left;   IRRIGATION AND DEBRIDEMENT FOOT Left 10/14/2019   Procedure: INCISION AND DRAINAGE FOR LEFT FOOT INFECTION, COMPLEX;  Surgeon: Evelina Bucy, DPM;  Location: [facility];  Service: Podiatry;  Laterality: Left;   IRRIGATION AND DEBRIDEMENT FOOT Left 10/17/2019   Procedure: Debridement and Irrigation of Left Foot; Removal of Silicone Implant; Insertion of Antibiotic Spacer; Application of External Fixator; Possible Application of Wound VAC;  Surgeon: Evelina Bucy, DPM;  Location: [facility];  Service: Podiatry;  Laterality: Left;  Pre-op Block POP/SAPH; MAC   KNEE ARTHROSCOPY Left 2018   ORIF CALCANEOUS FRACTURE Left 10/17/2019   Procedure: Application External Fixation;  Surgeon: Evelina Bucy, DPM;  Location: [facility];  Service: Podiatry;  Laterality: Left;    There were no vitals filed for this visit.   Subjective Assessment - 10/01/20 1134     Subjective Pt reports doing her exercises consistently. She states she thinks her exercises have helped her to be able to be more active during the day. She states she has been working working out at [employer] and plans on walking laps around her gym tomorrow if she is feeling well enough. She says she has been feeling better overall since starting PT.    Limitations Standing;Walking    Currently in Pain? No/denies    Pain Score 0-No pain                               [facility] Adult PT Treatment/Exercise - 10/01/20 0001       Knee/Hip Exercises: Aerobic   Nustep L4 x 5 min LE only      Knee/Hip Exercises: Standing   Other Standing Knee Exercises Step through on Bosu ball in // bars x10 BIL      Cryotherapy   Number Minutes Cryotherapy 10 Minutes    Cryotherapy Location Ankle    Type of Cryotherapy Ice pack      Manual Therapy   Manual Therapy Joint mobilization    Joint Mobilization L 1st MTP AP/PA grade 4 mobilizations      Ankle Exercises: Standing   Other Standing Ankle  Exercises Single leg stance on Airex pad in // bars 3x30sec BIL    Other Standing Ankle Exercises Marching on Bosu Ball in // bars 3x30sec                    PT Education - 10/01/20 1211     Education Details Pt educated on importance of gradual introduction of physical activity as she begins to workout in the gym with aspirations of community walking.    Person(s) Educated Patient  Methods Explanation    Comprehension Verbalized understanding              PT Short Term Goals - 08/25/20 1232       PT SHORT TERM GOAL #1   Title pt to be IND with inital HEP    Time 3    Period Weeks    Status New    Target Date 09/22/20      PT SHORT TERM GOAL #2   Title assess berg balance and update goal    Time 1    Period Weeks    Status New    Target Date 09/01/20               PT Long Term Goals - 08/25/20 1233       PT LONG TERM GOAL #1   Title increase L ankle dF to >/= 6 degrees for ROM required for efficient gait pattern.    Time 6    Period Weeks    Status New    Target Date 10/06/20      PT LONG TERM GOAL #2   Title increase great toe extension to >/= 60 degrees AROM to promote toe off pattern of gait    Time 6    Period Weeks    Status New    Target Date 10/06/20      PT LONG TERM GOAL #3   Title increase gross ankle strength to >/= 5/5 in all planes to promote stability and maximize safety    Baseline -    Time 6    Period Weeks    Status New    Target Date 10/06/20      PT LONG TERM GOAL #4   Title increase FOTO score to >/= 57% to demo improvement in function    Time 6    Period Weeks    Status New    Target Date 10/06/20      PT LONG TERM GOAL #5   Title pt to be able to walk and stand for >/= 45 min with LRAD for endurnace required for in home and community amb with </= 2/10 max pain    Time 6    Period Weeks    Status New    Target Date 10/06/20      Additional Long Term Goals   Additional Long Term Goals Yes      PT LONG  TERM GOAL #6   Title pt to be IND with all HEP and is able to maintain and progress current LOF IND    Time 6    Period Weeks    Status New    Target Date 10/06/20                   Plan - 10/01/20 1309     Clinical Impression Statement Pt responded well to all treatment today, demonstrating improved single leg balance compared to previous sessions. She was able to perform single leg stance on Airex pad with minimal sway or UE support. She will continue to benefit from skilled PT to address her primary impairments and return to her prior level of function without limitation.    Personal Factors and Comorbidities Age;Comorbidity 3+    Comorbidities hx of stroke, OA, HTN    Stability/Clinical Decision Making Evolving/Moderate complexity    Clinical Decision Making Moderate    Rehab Potential Good    PT Frequency 2x / week    PT Duration 6 weeks  PT Treatment/Interventions ADLs/Self Care Home Management;Moist Heat;Ultrasound;Cryotherapy;Gait training;Stair training;Functional mobility training;Therapeutic activities;Therapeutic exercise;Balance training;Neuromuscular re-education;Patient/family education;Manual techniques;Passive range of motion;Taping    PT Next Visit Plan Great toe ROM and LE strength, gait training, balance training    PT Home Exercise Plan [id number]    Consulted and Agree with Plan of Care Patient             Patient will benefit from skilled therapeutic intervention in order to improve the following deficits and impairments:  Improper body mechanics, Increased muscle spasms, Decreased strength, Pain, Postural dysfunction  Visit Diagnosis: Pain in left foot  Other abnormalities of gait and mobility  Stiffness of left ankle, not elsewhere classified  Muscle weakness (generalized)     Problem List Patient Active Problem List   Diagnosis Date Noted   Osteomyelitis of toe of right foot ([place])    Wound dehiscence    PICC (peripherally inserted  central catheter) in place 11/23/2019   Medication monitoring encounter 11/23/2019   Osteomyelitis of ankle or foot, acute, right ([place]) 11/22/2019   Superficial venous thrombosis of arm, right 11/02/2019   Abscess of bursa of left foot 10/14/2019   Foot abscess, left 10/14/2019   Wound infection    Unilateral primary osteoarthritis, left knee 04/27/2019   Chondromalacia patellae, left knee 07/02/2016   Arthritis of left knee 02/25/2016   Vaginitis and vulvovaginitis, unspecified 08/08/2013   Leiomyoma of uterus, unspecified 08/08/2013   Atypical chest pain 03/13/2013   Abnormal ECG 03/13/2013   Obesity, unspecified 03/13/2013   Hypertension     Vanessa , PT, DPT 10/01/20 1:12 PM   [facility] [address] [place], [name], [address] Phone: [phone]   Fax:  [phone]  Name: Alexis Ball MRN: [id number] Date of Birth: 20-May-1955

## 2020-10-07 ENCOUNTER — Other Ambulatory Visit: Payer: Self-pay

## 2020-10-07 ENCOUNTER — Ambulatory Visit (INDEPENDENT_AMBULATORY_CARE_PROVIDER_SITE_OTHER): Payer: Medicare Other | Admitting: Podiatry

## 2020-10-07 DIAGNOSIS — E1142 Type 2 diabetes mellitus with diabetic polyneuropathy: Secondary | ICD-10-CM | POA: Diagnosis not present

## 2020-10-07 DIAGNOSIS — M205X2 Other deformities of toe(s) (acquired), left foot: Secondary | ICD-10-CM

## 2020-10-07 MED ORDER — OXYCODONE-ACETAMINOPHEN 10-325 MG PO TABS: 1.0000 | ORAL_TABLET | ORAL | 0 refills | Status: DC | PRN

## 2020-10-08 ENCOUNTER — Telehealth: Payer: Self-pay

## 2020-10-08 ENCOUNTER — Ambulatory Visit: Payer: Medicare Other

## 2020-10-08 NOTE — Telephone Encounter (Signed)
Pt did not answer call. PT left voice message to inform pt that she had her first no-show. Education regarding the clinic's attendance policy, as well as the pt's next appointment time were provided. The clinic's phone number was also provided if the pt has any questions or needs to cancel any future appointments.

## 2020-10-14 NOTE — Progress Notes (Signed)
  Subjective:  Patient ID: Alexis Ball, female    DOB: 04/20/1955,  MRN: [id number]  Chief Complaint  Patient presents with   Routine Post Op    F/U Lt joint replacement -pt states pain is the same or wose; 10/10." - w/ swelling Tx: cane, topical pain cream     DOS: 07/02/20 Procedure: Left foot removal of hardware, bone biopsy, first metatarsophalangeal joint replacement with implant  65 y.o. female presents with the above complaint. History confirmed with patient.   Objective:  Physical Exam: no tenderness at the surgical site, local edema noted, and calf supple, nontender.Good range of motion noted at the first metatarsophalangeal joint -5 degrees of dorsiflexion noted Incision: healed Assessment:   1. Hallux limitus of left foot   2. Diabetic peripheral neuropathy associated with type 2 diabetes mellitus ([place])      Plan:  Patient was evaluated and treated and all questions answered.  Post-operative State -She is overall doing okay.  I think she still needs to work on range of motion exercises -her range of motion is about 5 degrees of DF but I think this can increase with more manual stretching. We discussed adequate shoe options for her.  Continue to work on stretching and work on her stamina -Pain medication refilled for 1 final time XRs needed at follow-up: 3 view Foot  Return in about 6 weeks (around 11/18/2020) for Joint replacement f/u.

## 2020-10-15 ENCOUNTER — Other Ambulatory Visit: Payer: Self-pay

## 2020-10-15 ENCOUNTER — Ambulatory Visit: Payer: Medicare Other | Attending: Podiatry

## 2020-10-15 DIAGNOSIS — R2689 Other abnormalities of gait and mobility: Secondary | ICD-10-CM | POA: Diagnosis present

## 2020-10-15 DIAGNOSIS — M79672 Pain in left foot: Secondary | ICD-10-CM | POA: Insufficient documentation

## 2020-10-15 DIAGNOSIS — M25672 Stiffness of left ankle, not elsewhere classified: Secondary | ICD-10-CM | POA: Insufficient documentation

## 2020-10-15 DIAGNOSIS — M6281 Muscle weakness (generalized): Secondary | ICD-10-CM | POA: Diagnosis present

## 2020-10-15 NOTE — Patient Instructions (Signed)
  [id number]

## 2020-10-15 NOTE — Therapy (Signed)
[place], [name], [address] Phone: [phone]   Fax:  [phone]  Physical Therapy Treatment  Patient Details  Name: Alexis Ball MRN: [id number] Date of Birth: 04-13-1955 Referring Provider (PT): Evelina Bucy, DPM   Encounter Date: 10/15/2020   PT End of Session - 10/15/20 1436     Visit Number 9    Number of Visits 13    Date for PT Re-Evaluation 10/06/20    Authorization Type mcr: Kx mod at 15th visit  FOTO 6th at 10th    Progress Note Due on Visit 10    PT Start Time 1400    PT Stop Time 1445    PT Time Calculation (min) 45 min    Equipment Utilized During Treatment Gait belt    Activity Tolerance Patient tolerated treatment well;Patient limited by pain    Behavior During Therapy WFL for tasks assessed/performed             Past Medical History:  Diagnosis Date   Allergic rhinitis    Ambulates with cane    Full dentures    GERD (gastroesophageal reflux disease)    occasional , takes tums   Glaucoma, both eyes    Heart murmur    evalulated by cardiologist--- dr h. Johney Frame 01-07-2020 note in epic, echo done 01-31-2020 normal w/ no valve abnormalities   History of cellulitis    2006  LLE   History of chest pain    had cardiac cath 10-16-2004 (in epic)  showed normal coronaries that are tortous, patent renal arteries, normal LVF with ef 50-55%, noncardiac chest pain;  pt had ED visit 12-07-2019 negative work-up referred to cardiology, was seen by dr Johney Frame 01-07-2020 atypical cp / non-cardiac echo ordered / no further work-up   History of CVA (cerebrovascular accident) without residual deficits    11-29-2019  per pt happened in 2007 and has not had cva/ tia sympomts since   History of DVT of lower extremity    06-26-2020  per pt many years ago had RLE DVT completed blood thinner treatement stated not other blood clot until 07/ 2021 RUE with superficial venous thrombosis  assosiated with PICC line which was removed   History of osteomyelitis 10/2019   left foot  post op bunionectomy 06/ 2021   Hypertension    followed by pcp   IDA (iron deficiency anemia)    Migraines    11-29-2019 per pt previously had botox injection for prevention, last had 3 yrs ago,  now uses breathing , meditation, and minimizes triggers (bright light/ loud noise)   Neuropathy    feet   OA (osteoarthritis)    Vascular dementia ([place])    per pcp h&p 06-18-2020 with mild cognitive impairment take, namenda with improvement   Vitamin D deficiency    Wears glasses     Past Surgical History:  Procedure Laterality Date   BONE BIOPSY Left 10/14/2019   Procedure: BONE BIOPSY PROXIMAL PHALANX;  Surgeon: Evelina Bucy, DPM;  Location: [place];  Service: Podiatry;  Laterality: Left;   BONE BIOPSY Left 04-30-2020  _0    x2  on left foot   BONE BIOPSY Left 07/02/2020   Procedure: BONE BIOPSY - FROZEN SECTION;  Surgeon: Evelina Bucy, DPM;  Location: [place];  Service: Podiatry;  Laterality: Left;   BUNIONECTOMY Left 10-05-2019  dr Milinda Pointer _1    great toe   CARDIAC CATHETERIZATION  10-16-2004  dr Einar Gip  normal coronaries (tortuous), normal LVF   CATARACT EXTRACTION W/ INTRAOCULAR LENS  IMPLANT, BILATERAL  2013   COLONOSCOPY  last one 04-23-2009  dr d. Sharlett Iles   FOOT SURGERY Right unsure date   bunionectomy and hammertoe   HARDWARE REMOVAL Right 06/ 2015  _0    right foot s/p bunionectomy/ hammertoe conrrection   HARDWARE REMOVAL Left 12/05/2019   Procedure: HARDWARE REMOVAL ,WOUND CLOSURE OF FIRST TOE;  Surgeon: Evelina Bucy, DPM;  Location: [place];  Service: Podiatry;  Laterality: Left;   IMPLANT OF A SILASTIC METATARSAL PHALANGE JOINT Left 07/02/2020   Procedure: IMPLANT OF A SILASTIC METATARSAL PHALANGE JOINT WITH REMOVAL OF ANTIBIOTIC SPACER;CAPSULAR REPAIR WITH TISSUE SUBSTITUTE;  Surgeon: Evelina Bucy, DPM;  Location: [place];  Service: Podiatry;  Laterality: Left;   IRRIGATION AND DEBRIDEMENT FOOT Left 10/14/2019   Procedure: INCISION AND DRAINAGE FOR LEFT FOOT INFECTION, COMPLEX;  Surgeon: Evelina Bucy, DPM;  Location: [place];  Service: Podiatry;  Laterality: Left;   IRRIGATION AND DEBRIDEMENT FOOT Left 10/17/2019   Procedure: Debridement and Irrigation of Left Foot; Removal of Silicone Implant; Insertion of Antibiotic Spacer; Application of External Fixator; Possible Application of Wound VAC;  Surgeon: Evelina Bucy, DPM;  Location: [place];  Service: Podiatry;  Laterality: Left;  Pre-op Block POP/SAPH; MAC   KNEE ARTHROSCOPY Left 2018   ORIF CALCANEOUS FRACTURE Left 10/17/2019   Procedure: Application External Fixation;  Surgeon: Evelina Bucy, DPM;  Location: [place];  Service: Podiatry;  Laterality: Left;    There were no vitals filed for this visit.   Subjective Assessment - 10/15/20 1404     Subjective Pt reports increased swelling in her L foot recently. She adds that her PCM gave her a "toe ring pad" for added cushioning of her great toe, but that she didn't like it due to it feeling like it was "too tight." She reports regular adherence to her HEP, although she notes she has pain with her exercises. She uses ice regularly to help with swelling.    Currently in Pain? Yes    Pain Score 10-Worst pain ever    Pain Location Foot    Pain Orientation Left    Pain Descriptors / Indicators Aching                               [facility] Adult PT Treatment/Exercise - 10/15/20 0001       Knee/Hip Exercises: Supine   Bridges with Clamshell --   2x10 with GTB     Cryotherapy   Number Minutes Cryotherapy 10 Minutes    Cryotherapy Location Ankle    Type of Cryotherapy Ice pack      Ankle Exercises: Seated   Heel Raises --   2x15 with ball of foot on floor promoting passive great toe and digit extension.                   PT Education - 10/15/20 1444     Education  Details Pt educated on proper form when performing new exercises, as well as importance of regular adherence to her HEP.    Person(s) Educated Patient    Methods Explanation;Demonstration;Handout    Comprehension Verbalized understanding;Returned demonstration              PT Short Term Goals - 08/25/20 1232       PT SHORT TERM GOAL #1  Title pt to be IND with inital HEP    Time 3    Period Weeks    Status New    Target Date 09/22/20      PT SHORT TERM GOAL #2   Title assess berg balance and update goal    Time 1    Period Weeks    Status New    Target Date 09/01/20               PT Long Term Goals - 08/25/20 1233       PT LONG TERM GOAL #1   Title increase L ankle dF to >/= 6 degrees for ROM required for efficient gait pattern.    Time 6    Period Weeks    Status New    Target Date 10/06/20      PT LONG TERM GOAL #2   Title increase great toe extension to >/= 60 degrees AROM to promote toe off pattern of gait    Time 6    Period Weeks    Status New    Target Date 10/06/20      PT LONG TERM GOAL #3   Title increase gross ankle strength to >/= 5/5 in all planes to promote stability and maximize safety    Baseline -    Time 6    Period Weeks    Status New    Target Date 10/06/20      PT LONG TERM GOAL #4   Title increase FOTO score to >/= 57% to demo improvement in function    Time 6    Period Weeks    Status New    Target Date 10/06/20      PT LONG TERM GOAL #5   Title pt to be able to walk and stand for >/= 45 min with LRAD for endurnace required for in home and community amb with </= 2/10 max pain    Time 6    Period Weeks    Status New    Target Date 10/06/20      Additional Long Term Goals   Additional Long Term Goals Yes      PT LONG TERM GOAL #6   Title pt to be IND with all HEP and is able to maintain and progress current LOF IND    Time 6    Period Weeks    Status New    Target Date 10/06/20                   Plan  - 10/15/20 1436     Clinical Impression Statement Pt responded well to all treatment today, demonstrating proper form and no increase in pain when performing exercises. She was able to perform single leg stance on Airex pad with minimal sway or UE support. She required regular verbal cues to correct for form when performing seated heel raises with passive toe extension. She will continue to benefit from skilled PT to address her primary impairments and return to her prior level of function without limitation.    Personal Factors and Comorbidities Age;Comorbidity 3+    Comorbidities hx of stroke, OA, HTN    Stability/Clinical Decision Making Evolving/Moderate complexity    Clinical Decision Making Moderate    Rehab Potential Good    PT Frequency 2x / week    PT Duration 6 weeks    PT Treatment/Interventions ADLs/Self Care Home Management;Moist Heat;Ultrasound;Cryotherapy;Gait training;Stair training;Functional mobility training;Therapeutic activities;Therapeutic exercise;Balance training;Neuromuscular re-education;Patient/family education;Manual techniques;Passive range  of motion;Taping    PT Next Visit Plan Great toe ROM and LE strength, gait training, balance training    PT Home Exercise Plan [id number]    Consulted and Agree with Plan of Care Patient             Patient will benefit from skilled therapeutic intervention in order to improve the following deficits and impairments:  Improper body mechanics, Increased muscle spasms, Decreased strength, Pain, Postural dysfunction  Visit Diagnosis: Pain in left foot  Other abnormalities of gait and mobility  Stiffness of left ankle, not elsewhere classified  Muscle weakness (generalized)     Problem List Patient Active Problem List   Diagnosis Date Noted   Osteomyelitis of toe of right foot ([place])    Wound dehiscence    PICC (peripherally inserted central catheter) in place 11/23/2019   Medication monitoring encounter 11/23/2019    Osteomyelitis of ankle or foot, acute, right ([place]) 11/22/2019   Superficial venous thrombosis of arm, right 11/02/2019   Abscess of bursa of left foot 10/14/2019   Foot abscess, left 10/14/2019   Wound infection    Unilateral primary osteoarthritis, left knee 04/27/2019   Chondromalacia patellae, left knee 07/02/2016   Arthritis of left knee 02/25/2016   Vaginitis and vulvovaginitis, unspecified 08/08/2013   Leiomyoma of uterus, unspecified 08/08/2013   Atypical chest pain 03/13/2013   Abnormal ECG 03/13/2013   Obesity, unspecified 03/13/2013   Hypertension     Vanessa Nowata, PT, DPT 10/15/20 2:45 PM   [facility] [address] [place], [name], [address] Phone: [phone]   Fax:  [phone]  Name: Alexis Ball MRN: [id number] Date of Birth: Dec 28, 1955

## 2020-10-22 ENCOUNTER — Ambulatory Visit: Payer: Medicare Other

## 2020-10-22 ENCOUNTER — Other Ambulatory Visit: Payer: Self-pay

## 2020-10-22 DIAGNOSIS — M79672 Pain in left foot: Secondary | ICD-10-CM | POA: Diagnosis not present

## 2020-10-22 DIAGNOSIS — M6281 Muscle weakness (generalized): Secondary | ICD-10-CM

## 2020-10-22 DIAGNOSIS — R2689 Other abnormalities of gait and mobility: Secondary | ICD-10-CM

## 2020-10-22 DIAGNOSIS — M25672 Stiffness of left ankle, not elsewhere classified: Secondary | ICD-10-CM

## 2020-10-22 NOTE — Therapy (Signed)
[place], [name], [address] Phone: [phone]   Fax:  [phone]  Physical Therapy Treatment/ Re-Certification   Progress Note Reporting Period 10/07/2020 to 11/12/2020  See note below for Objective Data and Assessment of Progress/Goals.      Patient Details  Name: Alexis Ball MRN: [id number] Date of Birth: 1956-03-19 Referring Provider (PT): Evelina Bucy, DPM   Encounter Date: 10/22/2020   PT End of Session - 10/22/20 1256     Visit Number 10    Number of Visits 13    Date for PT Re-Evaluation 11/12/20    Authorization Type mcr: Kx mod at 15th visit  FOTO 6th at 10th    Progress Note Due on Visit 10    PT Start Time 1220    PT Stop Time 1300    PT Time Calculation (min) 40 min    Activity Tolerance Patient tolerated treatment well;Patient limited by pain    Behavior During Therapy [facility] for tasks assessed/performed             Past Medical History:  Diagnosis Date   Allergic rhinitis    Ambulates with cane    Full dentures    GERD (gastroesophageal reflux disease)    occasional , takes tums   Glaucoma, both eyes    Heart murmur    evalulated by cardiologist--- dr h. Johney Frame 01-07-2020 note in epic, echo done 01-31-2020 normal w/ no valve abnormalities   History of cellulitis    2006  LLE   History of chest pain    had cardiac cath 10-16-2004 (in epic)  showed normal coronaries that are tortous, patent renal arteries, normal LVF with ef 50-55%, noncardiac chest pain;  pt had ED visit 12-07-2019 negative work-up referred to cardiology, was seen by dr Johney Frame 01-07-2020 atypical cp / non-cardiac echo ordered / no further work-up   History of CVA (cerebrovascular accident) without residual deficits    11-29-2019  per pt happened in 2007 and has not had cva/ tia sympomts since   History of DVT of lower extremity    06-26-2020  per pt many years ago had RLE DVT completed blood  thinner treatement stated not other blood clot until 07/ 2021 RUE with superficial venous thrombosis assosiated with PICC line which was removed   History of osteomyelitis 10/2019   left foot  post op bunionectomy 06/ 2021   Hypertension    followed by pcp   IDA (iron deficiency anemia)    Migraines    11-29-2019 per pt previously had botox injection for prevention, last had 3 yrs ago,  now uses breathing , meditation, and minimizes triggers (bright light/ loud noise)   Neuropathy    feet   OA (osteoarthritis)    Vascular dementia ([place])    per pcp h&p 06-18-2020 with mild cognitive impairment take, namenda with improvement   Vitamin D deficiency    Wears glasses     Past Surgical History:  Procedure Laterality Date   BONE BIOPSY Left 10/14/2019   Procedure: BONE BIOPSY PROXIMAL PHALANX;  Surgeon: Evelina Bucy, DPM;  Location: [place];  Service: Podiatry;  Laterality: Left;   BONE BIOPSY Left 04-30-2020  _0    x2  on left foot   BONE BIOPSY Left 07/02/2020   Procedure: BONE BIOPSY - FROZEN SECTION;  Surgeon: Evelina Bucy, DPM;  Location: [place];  Service: Podiatry;  Laterality: Left;   BUNIONECTOMY Left 10-05-2019  dr  hyatt _0    great toe   CARDIAC CATHETERIZATION  10-16-2004  dr Einar Gip   normal coronaries (tortuous), normal LVF   CATARACT EXTRACTION W/ INTRAOCULAR LENS  IMPLANT, BILATERAL  2013   COLONOSCOPY  last one 04-23-2009  dr d. Sharlett Iles   FOOT SURGERY Right unsure date   bunionectomy and hammertoe   HARDWARE REMOVAL Right 06/ 2015  _1    right foot s/p bunionectomy/ hammertoe conrrection   HARDWARE REMOVAL Left 12/05/2019   Procedure: HARDWARE REMOVAL ,WOUND CLOSURE OF FIRST TOE;  Surgeon: Evelina Bucy, DPM;  Location: [place];  Service: Podiatry;  Laterality: Left;   IMPLANT OF A SILASTIC METATARSAL PHALANGE JOINT Left 07/02/2020   Procedure: IMPLANT OF A SILASTIC METATARSAL PHALANGE JOINT WITH REMOVAL OF ANTIBIOTIC  SPACER;CAPSULAR REPAIR WITH TISSUE SUBSTITUTE;  Surgeon: Evelina Bucy, DPM;  Location: [place];  Service: Podiatry;  Laterality: Left;   IRRIGATION AND DEBRIDEMENT FOOT Left 10/14/2019   Procedure: INCISION AND DRAINAGE FOR LEFT FOOT INFECTION, COMPLEX;  Surgeon: Evelina Bucy, DPM;  Location: [place];  Service: Podiatry;  Laterality: Left;   IRRIGATION AND DEBRIDEMENT FOOT Left 10/17/2019   Procedure: Debridement and Irrigation of Left Foot; Removal of Silicone Implant; Insertion of Antibiotic Spacer; Application of External Fixator; Possible Application of Wound VAC;  Surgeon: Evelina Bucy, DPM;  Location: [place];  Service: Podiatry;  Laterality: Left;  Pre-op Block POP/SAPH; MAC   KNEE ARTHROSCOPY Left 2018   ORIF CALCANEOUS FRACTURE Left 10/17/2019   Procedure: Application External Fixation;  Surgeon: Evelina Bucy, DPM;  Location: [place];  Service: Podiatry;  Laterality: Left;    There were no vitals filed for this visit.   Subjective Assessment - 10/22/20 1223     Subjective Pt reports feeling well, stating she feels steady progress with PT. She reports having muscular fatigue due to having to wait for her ride. She reports having to walk 9 blocks in search of her phone on Monday, resulting in increased fatigue, as well. This has also resulted in increased L foot pain and swelling. She reports not being able to regularly perform her HEP due to the increased fatigue. She reports that she would still like to complete the remaining 3 visits on her POC although her progress in PT has been limited to this point.    How long can you sit comfortably? Unlimited    How long can you stand comfortably? 10 min    How long can you walk comfortably? 10 min with SPC    Currently in Pain? Yes    Pain Score 10-Worst pain ever    Pain Location Foot    Pain Orientation Left    Pain Descriptors / Indicators Constant;Cramping;Sharp    Pain Type Surgical pain;Chronic pain    Pain Onset  More than a month ago    Pain Frequency Constant                [facility] PT Assessment - 10/22/20 0001       Assessment   Medical Diagnosis Hallux limitus of left foot (M20.5X2), Post-operative state (R41.638)    Referring Provider (PT) Evelina Bucy, DPM      Observation/Other Assessments   Observations Increased swelling at global L foot/ ankle    Focus on Therapeutic Outcomes (FOTO)  23%      AROM   Overall AROM Comments 10 great toe ext    Left Ankle Dorsiflexion 5    Left  Ankle Plantar Flexion 40    Left Ankle Inversion 35    Left Ankle Eversion 10      PROM   Overall PROM Comments 55 great toe ext    PROM Assessment Site Ankle    Right/Left Ankle Left    Left Ankle Dorsiflexion 10      Strength   Right Ankle Dorsiflexion 5/5    Right Ankle Plantar Flexion 5/5    Right Ankle Inversion 5/5    Right Ankle Eversion 5/5    Left Ankle Dorsiflexion 5/5    Left Ankle Plantar Flexion 5/5    Left Ankle Inversion 5/5    Left Ankle Eversion 5/5      Palpation   Palpation comment L foot very warm to the touch                           [facility] Adult PT Treatment/Exercise - 10/22/20 0001       Knee/Hip Exercises: Standing   Other Standing Knee Exercises Forward/ backward weight shifts with emphasis on passive great toe extension 2x10      Cryotherapy   Number Minutes Cryotherapy 10 Minutes    Cryotherapy Location Ankle    Type of Cryotherapy Ice pack      Manual Therapy   Manual Therapy Joint mobilization    Joint Mobilization L 1st MTP AP/PA grade 4 mobilizations                    PT Education - 10/22/20 1255     Education Details Reviewed HEP with pt. No additional exercises added due to non-compliance with current HEP.    Person(s) Educated Patient    Methods Explanation    Comprehension Verbalized understanding              PT Short Term Goals - 10/22/20 1306       PT SHORT TERM GOAL #1   Title pt to be IND with  inital HEP    Time 3    Period Weeks    Status On-going    Target Date 09/22/20      PT SHORT TERM GOAL #2   Title -               PT Long Term Goals - 10/22/20 1307       PT LONG TERM GOAL #1   Title increase L ankle dF to >/= 6 degrees for ROM required for efficient gait pattern.    Baseline 5 degrees on 10/22/2020    Time 6    Period Weeks    Status Partially Met      PT LONG TERM GOAL #2   Title increase great toe extension to >/= 60 degrees AROM to promote toe off pattern of gait    Baseline no change since initial eval -10/22/2020    Time 6    Period Weeks    Status On-going      PT LONG TERM GOAL #3   Title increase gross ankle strength to >/= 5/5 in all planes to promote stability and maximize safety    Baseline -    Time 6    Period Weeks    Status Achieved      PT LONG TERM GOAL #4   Title increase FOTO score to >/= 57% to demo improvement in function    Baseline 23% on 10/22/2020    Time 6    Period  Weeks    Status On-going      PT LONG TERM GOAL #5   Title pt to be able to walk and stand for >/= 45 min with LRAD for endurnace required for in home and community amb with </= 2/10 max pain    Time 6    Period Weeks    Status On-going      PT LONG TERM GOAL #6   Title pt to be IND with all HEP and is able to maintain and progress current LOF IND    Time 6    Period Weeks    Status On-going                   Plan - 10/22/20 1257     Clinical Impression Statement Upon reassessment, pt demonstrates regression of symptom presentation ranging from FOTO score and ankle/ great toe ROM to pain and swelling. This may be at least partially due to the pt walking 9 blocks in search of her lost phone 2 days ago, along with standing for 15 minutes today while waiting for her ride. The pt responded well to cryotherapy to end session with decreased pain to 2/10 and decreased swelling. The option was given to the pt to be discharged from PT or finish out  her POC. She opts to complete the remaining 3 visits on her POC to try to make additional progress before being discharged from PT. If no progress is made by visit 13, the pt will be discharged and referred back to her PCM to pursue alternative treatment strategies for her condition. She will benefit from continued skilled PT to address her primary impairments and return to her prior level of function with minimal limitation.    Personal Factors and Comorbidities Age;Comorbidity 3+    Comorbidities hx of stroke, OA, HTN    Stability/Clinical Decision Making Evolving/Moderate complexity    Clinical Decision Making Moderate    Rehab Potential Good    PT Frequency 1x / week    PT Duration 3 weeks    PT Treatment/Interventions ADLs/Self Care Home Management;Moist Heat;Ultrasound;Cryotherapy;Gait training;Stair training;Functional mobility training;Therapeutic activities;Therapeutic exercise;Balance training;Neuromuscular re-education;Patient/family education;Manual techniques;Passive range of motion;Taping    PT Next Visit Plan Great toe ROM and LE strength, pain modulation    PT Home Exercise Plan [id number]    Consulted and Agree with Plan of Care Patient             Patient will benefit from skilled therapeutic intervention in order to improve the following deficits and impairments:  Improper body mechanics, Increased muscle spasms, Decreased strength, Pain, Postural dysfunction  Visit Diagnosis: Pain in left foot  Other abnormalities of gait and mobility  Stiffness of left ankle, not elsewhere classified  Muscle weakness (generalized)     Problem List Patient Active Problem List   Diagnosis Date Noted   Osteomyelitis of toe of right foot ([place])    Wound dehiscence    PICC (peripherally inserted central catheter) in place 11/23/2019   Medication monitoring encounter 11/23/2019   Osteomyelitis of ankle or foot, acute, right ([place]) 11/22/2019   Superficial venous thrombosis of arm,  right 11/02/2019   Abscess of bursa of left foot 10/14/2019   Foot abscess, left 10/14/2019   Wound infection    Unilateral primary osteoarthritis, left knee 04/27/2019   Chondromalacia patellae, left knee 07/02/2016   Arthritis of left knee 02/25/2016   Vaginitis and vulvovaginitis, unspecified 08/08/2013   Leiomyoma of uterus, unspecified 08/08/2013  Atypical chest pain 03/13/2013   Abnormal ECG 03/13/2013   Obesity, unspecified 03/13/2013   Hypertension     Vanessa Livermore, PT, DPT 10/22/20 1:13 PM   [facility] [address] [place], [name], [address] Phone: [phone]   Fax:  [phone]  Name: Alexis Ball MRN: [id number] Date of Birth: 06/06/1955

## 2020-10-22 NOTE — Patient Instructions (Signed)
Reviewed pt's HEP with her and stressed importance of regular HEP adherence.

## 2020-10-29 ENCOUNTER — Ambulatory Visit: Payer: Medicare Other

## 2020-10-29 ENCOUNTER — Other Ambulatory Visit: Payer: Self-pay

## 2020-10-29 DIAGNOSIS — M79672 Pain in left foot: Secondary | ICD-10-CM | POA: Diagnosis not present

## 2020-10-29 DIAGNOSIS — M6281 Muscle weakness (generalized): Secondary | ICD-10-CM

## 2020-10-29 DIAGNOSIS — R2689 Other abnormalities of gait and mobility: Secondary | ICD-10-CM

## 2020-10-29 DIAGNOSIS — M25672 Stiffness of left ankle, not elsewhere classified: Secondary | ICD-10-CM

## 2020-10-29 NOTE — Therapy (Signed)
[place], [name], [address] Phone: [phone]   Fax:  [phone]  Physical Therapy Treatment  Patient Details  Name: Alexis Ball MRN: [id number] Date of Birth: [birth year] Referring Provider (PT): Evelina Bucy, DPM   Encounter Date: 10/29/2020   PT End of Session - 10/29/20 1248     Visit Number 11    Number of Visits 13    Date for PT Re-Evaluation 11/12/20    Authorization Type mcr: Kx mod at 15th visit  FOTO 6th at 10th    PT Start Time 1220    PT Stop Time 1305    PT Time Calculation (min) 45 min    Activity Tolerance Patient tolerated treatment well;Patient limited by pain    Behavior During Therapy [facility] for tasks assessed/performed             Past Medical History:  Diagnosis Date   Allergic rhinitis    Ambulates with cane    Full dentures    GERD (gastroesophageal reflux disease)    occasional , takes tums   Glaucoma, both eyes    Heart murmur    evalulated by cardiologist--- dr h. Johney Frame 01-07-2020 note in epic, echo done 01-31-2020 normal w/ no valve abnormalities   History of cellulitis    2006  LLE   History of chest pain    had cardiac cath 10-16-2004 (in epic)  showed normal coronaries that are tortous, patent renal arteries, normal LVF with ef 50-55%, noncardiac chest pain;  pt had ED visit 12-07-2019 negative work-up referred to cardiology, was seen by dr Johney Frame 01-07-2020 atypical cp / non-cardiac echo ordered / no further work-up   History of CVA (cerebrovascular accident) without residual deficits    11-29-2019  per pt happened in 2007 and has not had cva/ tia sympomts since   History of DVT of lower extremity    06-26-2020  per pt many years ago had RLE DVT completed blood thinner treatement stated not other blood clot until 07/ 2021 RUE with superficial venous thrombosis assosiated with PICC line which was removed   History of osteomyelitis 10/2019   left  foot  post op bunionectomy 06/ 2021   Hypertension    followed by pcp   IDA (iron deficiency anemia)    Migraines    11-29-2019 per pt previously had botox injection for prevention, last had 3 yrs ago,  now uses breathing , meditation, and minimizes triggers (bright light/ loud noise)   Neuropathy    feet   OA (osteoarthritis)    Vascular dementia ([place])    per pcp h&p 06-18-2020 with mild cognitive impairment take, namenda with improvement   Vitamin D deficiency    Wears glasses     Past Surgical History:  Procedure Laterality Date   BONE BIOPSY Left 10/14/2019   Procedure: BONE BIOPSY PROXIMAL PHALANX;  Surgeon: Evelina Bucy, DPM;  Location: [place];  Service: Podiatry;  Laterality: Left;   BONE BIOPSY Left 04-30-2020  _0    x2  on left foot   BONE BIOPSY Left 07/02/2020   Procedure: BONE BIOPSY - FROZEN SECTION;  Surgeon: Evelina Bucy, DPM;  Location: [place];  Service: Podiatry;  Laterality: Left;   BUNIONECTOMY Left 10-05-2019  dr Milinda Pointer _1    great toe   CARDIAC CATHETERIZATION  10-16-2004  dr Einar Gip   normal coronaries (tortuous), normal LVF   CATARACT EXTRACTION W/ INTRAOCULAR LENS  IMPLANT, BILATERAL  2013  COLONOSCOPY  last one 04-23-2009  dr d. Sharlett Iles   FOOT SURGERY Right unsure date   bunionectomy and hammertoe   HARDWARE REMOVAL Right 06/ 2015  _0    right foot s/p bunionectomy/ hammertoe conrrection   HARDWARE REMOVAL Left 12/05/2019   Procedure: HARDWARE REMOVAL ,WOUND CLOSURE OF FIRST TOE;  Surgeon: Evelina Bucy, DPM;  Location: [place];  Service: Podiatry;  Laterality: Left;   IMPLANT OF A SILASTIC METATARSAL PHALANGE JOINT Left 07/02/2020   Procedure: IMPLANT OF A SILASTIC METATARSAL PHALANGE JOINT WITH REMOVAL OF ANTIBIOTIC SPACER;CAPSULAR REPAIR WITH TISSUE SUBSTITUTE;  Surgeon: Evelina Bucy, DPM;  Location: [place];  Service: Podiatry;  Laterality: Left;   IRRIGATION AND DEBRIDEMENT FOOT  Left 10/14/2019   Procedure: INCISION AND DRAINAGE FOR LEFT FOOT INFECTION, COMPLEX;  Surgeon: Evelina Bucy, DPM;  Location: [place];  Service: Podiatry;  Laterality: Left;   IRRIGATION AND DEBRIDEMENT FOOT Left 10/17/2019   Procedure: Debridement and Irrigation of Left Foot; Removal of Silicone Implant; Insertion of Antibiotic Spacer; Application of External Fixator; Possible Application of Wound VAC;  Surgeon: Evelina Bucy, DPM;  Location: [place];  Service: Podiatry;  Laterality: Left;  Pre-op Block POP/SAPH; MAC   KNEE ARTHROSCOPY Left 2018   ORIF CALCANEOUS FRACTURE Left 10/17/2019   Procedure: Application External Fixation;  Surgeon: Evelina Bucy, DPM;  Location: [place];  Service: Podiatry;  Laterality: Left;    There were no vitals filed for this visit.   Subjective Assessment - 10/29/20 1217     Subjective Pt reports being adherent to her HEP, although she reports increased L foot pain now that she has taken off the plantar pad that another provider recommended. She rates her pain as a 5/10 today, although the pain has been as high as 10/10 this week. She states the pain has gotten down to a 2/10 following uzing ice.    Currently in Pain? Yes    Pain Score 5     Pain Location Foot    Pain Orientation Left    Pain Descriptors / Indicators Constant;Cramping;Sharp    Pain Type Chronic pain;Surgical pain                               [facility] Adult PT Treatment/Exercise - 10/29/20 0001       Cryotherapy   Number Minutes Cryotherapy 10 Minutes    Cryotherapy Location Ankle    Type of Cryotherapy Ice pack      Manual Therapy   Manual Therapy Joint mobilization    Joint Mobilization L 1st MTP AP/PA grade 4 mobilizations      Ankle Exercises: Seated   Towel Crunch Other (comment)   x5 minutes with focus on toe flexion   Other Seated Ankle Exercises Passive toe extension stretch on rolled towel 2x1 minute      Ankle Exercises: Standing   Heel Raises Other  (comment)   2x10 in // bars                   PT Education - 10/29/20 1248     Education Details Intoduced passive toe extension stretch on towel, demonstrating proper form.    Person(s) Educated Patient    Methods Explanation;Demonstration;Handout    Comprehension Verbalized understanding;Returned demonstration              PT Short Term Goals - 10/29/20 1259  PT SHORT TERM GOAL #1   Title pt to be IND with inital HEP    Time 3    Period Weeks    Status Achieved    Target Date 09/22/20      PT SHORT TERM GOAL #2   Title -               PT Long Term Goals - 10/22/20 1307       PT LONG TERM GOAL #1   Title increase L ankle dF to >/= 6 degrees for ROM required for efficient gait pattern.    Baseline 5 degrees on 10/22/2020    Time 6    Period Weeks    Status Partially Met      PT LONG TERM GOAL #2   Title increase great toe extension to >/= 60 degrees AROM to promote toe off pattern of gait    Baseline no change since initial eval -10/22/2020    Time 6    Period Weeks    Status On-going      PT LONG TERM GOAL #3   Title increase gross ankle strength to >/= 5/5 in all planes to promote stability and maximize safety    Baseline -    Time 6    Period Weeks    Status Achieved      PT LONG TERM GOAL #4   Title increase FOTO score to >/= 57% to demo improvement in function    Baseline 23% on 10/22/2020    Time 6    Period Weeks    Status On-going      PT LONG TERM GOAL #5   Title pt to be able to walk and stand for >/= 45 min with LRAD for endurnace required for in home and community amb with </= 2/10 max pain    Time 6    Period Weeks    Status On-going      PT LONG TERM GOAL #6   Title pt to be IND with all HEP and is able to maintain and progress current LOF IND    Time 6    Period Weeks    Status On-going                   Plan - 10/29/20 1250     Clinical Impression Statement Pt responded well to treatment today,  demonstrating improved great toe extension ROM following passive extension stretch and joint mobilization. She also reports decrease in pain from 5/10 to 1/10 following cryotherapy. She will continue to benefit from skilled PT to address her remaining impairments and return to her prior level of function without limitation.    Personal Factors and Comorbidities Age;Comorbidity 3+    Comorbidities hx of stroke, OA, HTN    Stability/Clinical Decision Making Evolving/Moderate complexity    Clinical Decision Making Moderate    Rehab Potential Good    PT Frequency 1x / week    PT Duration 3 weeks    PT Treatment/Interventions ADLs/Self Care Home Management;Moist Heat;Ultrasound;Cryotherapy;Gait training;Stair training;Functional mobility training;Therapeutic activities;Therapeutic exercise;Balance training;Neuromuscular re-education;Patient/family education;Manual techniques;Passive range of motion;Taping    PT Next Visit Plan Great toe ROM and LE strength, pain modulation    PT Home Exercise Plan [id number]    Consulted and Agree with Plan of Care Patient             Patient will benefit from skilled therapeutic intervention in order to improve the following deficits and impairments:  Improper body  mechanics, Increased muscle spasms, Decreased strength, Pain, Postural dysfunction  Visit Diagnosis: Pain in left foot  Other abnormalities of gait and mobility  Stiffness of left ankle, not elsewhere classified  Muscle weakness (generalized)     Problem List Patient Active Problem List   Diagnosis Date Noted   Osteomyelitis of toe of right foot ([place])    Wound dehiscence    PICC (peripherally inserted central catheter) in place 11/23/2019   Medication monitoring encounter 11/23/2019   Osteomyelitis of ankle or foot, acute, right ([place]) 11/22/2019   Superficial venous thrombosis of arm, right 11/02/2019   Abscess of bursa of left foot 10/14/2019   Foot abscess, left 10/14/2019   Wound  infection    Unilateral primary osteoarthritis, left knee 04/27/2019   Chondromalacia patellae, left knee 07/02/2016   Arthritis of left knee 02/25/2016   Vaginitis and vulvovaginitis, unspecified 08/08/2013   Leiomyoma of uterus, unspecified 08/08/2013   Atypical chest pain 03/13/2013   Abnormal ECG 03/13/2013   Obesity, unspecified 03/13/2013   Hypertension     Vanessa Inwood, PT, DPT 10/29/20 1:00 PM   [facility] [address] [place], [name], [address] Phone: [phone]   Fax:  [phone]  Name: DELIA SLATTEN MRN: [id number] Date of Birth: [birth year]

## 2020-10-29 NOTE — Patient Instructions (Signed)
  [id number]

## 2020-11-05 ENCOUNTER — Other Ambulatory Visit: Payer: Self-pay

## 2020-11-05 ENCOUNTER — Ambulatory Visit: Payer: Medicare Other

## 2020-11-05 DIAGNOSIS — M79672 Pain in left foot: Secondary | ICD-10-CM | POA: Diagnosis not present

## 2020-11-05 DIAGNOSIS — M6281 Muscle weakness (generalized): Secondary | ICD-10-CM

## 2020-11-05 DIAGNOSIS — R2689 Other abnormalities of gait and mobility: Secondary | ICD-10-CM

## 2020-11-05 DIAGNOSIS — M25672 Stiffness of left ankle, not elsewhere classified: Secondary | ICD-10-CM

## 2020-11-05 NOTE — Therapy (Signed)
[place], [name], [address] Phone: [phone]   Fax:  [phone]  Physical Therapy Treatment  Patient Details  Name: Alexis Ball MRN: [id number] Date of Birth: [birth year] Referring Provider (PT): Evelina Bucy, DPM   Encounter Date: 11/05/2020   PT End of Session - 11/05/20 1243     Visit Number 12    Number of Visits 13    Date for PT Re-Evaluation 11/12/20    Authorization Type mcr: Kx mod at 15th visit  FOTO 6th at 10th    Progress Note Due on Visit 10    PT Start Time 1215    PT Stop Time 1300    PT Time Calculation (min) 45 min    Equipment Utilized During Treatment Gait belt    Activity Tolerance Patient tolerated treatment well;Patient limited by pain    Behavior During Therapy WFL for tasks assessed/performed             Past Medical History:  Diagnosis Date   Allergic rhinitis    Ambulates with cane    Full dentures    GERD (gastroesophageal reflux disease)    occasional , takes tums   Glaucoma, both eyes    Heart murmur    evalulated by cardiologist--- dr h. Johney Frame 01-07-2020 note in epic, echo done 01-31-2020 normal w/ no valve abnormalities   History of cellulitis    2006  LLE   History of chest pain    had cardiac cath 10-16-2004 (in epic)  showed normal coronaries that are tortous, patent renal arteries, normal LVF with ef 50-55%, noncardiac chest pain;  pt had ED visit 12-07-2019 negative work-up referred to cardiology, was seen by dr Johney Frame 01-07-2020 atypical cp / non-cardiac echo ordered / no further work-up   History of CVA (cerebrovascular accident) without residual deficits    11-29-2019  per pt happened in 2007 and has not had cva/ tia sympomts since   History of DVT of lower extremity    06-26-2020  per pt many years ago had RLE DVT completed blood thinner treatement stated not other blood clot until 07/ 2021 RUE with superficial venous thrombosis  assosiated with PICC line which was removed   History of osteomyelitis 10/2019   left foot  post op bunionectomy 06/ 2021   Hypertension    followed by pcp   IDA (iron deficiency anemia)    Migraines    11-29-2019 per pt previously had botox injection for prevention, last had 3 yrs ago,  now uses breathing , meditation, and minimizes triggers (bright light/ loud noise)   Neuropathy    feet   OA (osteoarthritis)    Vascular dementia ([place])    per pcp h&p 06-18-2020 with mild cognitive impairment take, namenda with improvement   Vitamin D deficiency    Wears glasses     Past Surgical History:  Procedure Laterality Date   BONE BIOPSY Left 10/14/2019   Procedure: BONE BIOPSY PROXIMAL PHALANX;  Surgeon: Evelina Bucy, DPM;  Location: [place];  Service: Podiatry;  Laterality: Left;   BONE BIOPSY Left 04-30-2020  _0    x2  on left foot   BONE BIOPSY Left 07/02/2020   Procedure: BONE BIOPSY - FROZEN SECTION;  Surgeon: Evelina Bucy, DPM;  Location: [place];  Service: Podiatry;  Laterality: Left;   BUNIONECTOMY Left 10-05-2019  dr Milinda Pointer _1    great toe   CARDIAC CATHETERIZATION  10-16-2004  dr Einar Gip  normal coronaries (tortuous), normal LVF   CATARACT EXTRACTION W/ INTRAOCULAR LENS  IMPLANT, BILATERAL  2013   COLONOSCOPY  last one 04-23-2009  dr d. Sharlett Iles   FOOT SURGERY Right unsure date   bunionectomy and hammertoe   HARDWARE REMOVAL Right 06/ 2015  _0    right foot s/p bunionectomy/ hammertoe conrrection   HARDWARE REMOVAL Left 12/05/2019   Procedure: HARDWARE REMOVAL ,WOUND CLOSURE OF FIRST TOE;  Surgeon: Evelina Bucy, DPM;  Location: [place];  Service: Podiatry;  Laterality: Left;   IMPLANT OF A SILASTIC METATARSAL PHALANGE JOINT Left 07/02/2020   Procedure: IMPLANT OF A SILASTIC METATARSAL PHALANGE JOINT WITH REMOVAL OF ANTIBIOTIC SPACER;CAPSULAR REPAIR WITH TISSUE SUBSTITUTE;  Surgeon: Evelina Bucy, DPM;  Location: [place];  Service: Podiatry;  Laterality: Left;   IRRIGATION AND DEBRIDEMENT FOOT Left 10/14/2019   Procedure: INCISION AND DRAINAGE FOR LEFT FOOT INFECTION, COMPLEX;  Surgeon: Evelina Bucy, DPM;  Location: [place];  Service: Podiatry;  Laterality: Left;   IRRIGATION AND DEBRIDEMENT FOOT Left 10/17/2019   Procedure: Debridement and Irrigation of Left Foot; Removal of Silicone Implant; Insertion of Antibiotic Spacer; Application of External Fixator; Possible Application of Wound VAC;  Surgeon: Evelina Bucy, DPM;  Location: [place];  Service: Podiatry;  Laterality: Left;  Pre-op Block POP/SAPH; MAC   KNEE ARTHROSCOPY Left 2018   ORIF CALCANEOUS FRACTURE Left 10/17/2019   Procedure: Application External Fixation;  Surgeon: Evelina Bucy, DPM;  Location: [place];  Service: Podiatry;  Laterality: Left;    There were no vitals filed for this visit.   Subjective Assessment - 11/05/20 1210     Subjective Pt reports feeling okay today, adding that she thinks she may have overdone it yesterday when performing her HEP. She also states she thinks she may have bruised her foot when she tripped when wlaking yesterday. This did not result in a fall. She reports continued adherence to her HEP, as well as improvement since starting PT. Her primary complaint at this point is foot/ great toe stiffness.    Currently in Pain? Yes    Pain Score 0-No pain                               [facility] Adult PT Treatment/Exercise - 11/05/20 0001       Knee/Hip Exercises: Standing   Forward Lunges Other (comment)   2x10   Wall Squat Other (comment)   2x10     Cryotherapy   Number Minutes Cryotherapy 10 Minutes    Cryotherapy Location Ankle    Type of Cryotherapy Ice pack      Manual Therapy   Manual Therapy Joint mobilization    Joint Mobilization L 1st MTP AP/PA grade 4 mobilizations      Ankle Exercises: Standing   Heel Raises Other (comment)   with medial malleoli together 2x10 with  slow lowering   Toe Walk (Round Trip) in // bars 3x2 laps                    PT Education - 11/05/20 1241     Education Details Reviewed HEP with pt with emphasis on great toe extension ROM. Educated on form when performing exercises in clinic.    Person(s) Educated Patient    Methods Explanation;Demonstration    Comprehension Verbalized understanding;Returned demonstration  PT Short Term Goals - 10/29/20 1259       PT SHORT TERM GOAL #1   Title pt to be IND with inital HEP    Time 3    Period Weeks    Status Achieved    Target Date 09/22/20      PT SHORT TERM GOAL #2   Title -               PT Long Term Goals - 10/22/20 1307       PT LONG TERM GOAL #1   Title increase L ankle dF to >/= 6 degrees for ROM required for efficient gait pattern.    Baseline 5 degrees on 10/22/2020    Time 6    Period Weeks    Status Partially Met      PT LONG TERM GOAL #2   Title increase great toe extension to >/= 60 degrees AROM to promote toe off pattern of gait    Baseline no change since initial eval -10/22/2020    Time 6    Period Weeks    Status On-going      PT LONG TERM GOAL #3   Title increase gross ankle strength to >/= 5/5 in all planes to promote stability and maximize safety    Baseline -    Time 6    Period Weeks    Status Achieved      PT LONG TERM GOAL #4   Title increase FOTO score to >/= 57% to demo improvement in function    Baseline 23% on 10/22/2020    Time 6    Period Weeks    Status On-going      PT LONG TERM GOAL #5   Title pt to be able to walk and stand for >/= 45 min with LRAD for endurnace required for in home and community amb with </= 2/10 max pain    Time 6    Period Weeks    Status On-going      PT LONG TERM GOAL #6   Title pt to be IND with all HEP and is able to maintain and progress current LOF IND    Time 6    Period Weeks    Status On-going                   Plan - 11/05/20 1249      Clinical Impression Statement Pt responded well to treatment today, demonstrating improved great toe extension ROM following passive extension stretch and joint mobilization. L ankle swelling visually improved following cryotherapy. The pt leaves clinic with 0/10 pain. She will continue to benefit from skilled PT to address her remaining impairments and return to her prior level of function without limitation.    Personal Factors and Comorbidities Age;Comorbidity 3+    Comorbidities hx of stroke, OA, HTN    Stability/Clinical Decision Making Evolving/Moderate complexity    Clinical Decision Making Moderate    Rehab Potential Good    PT Frequency 1x / week    PT Duration 3 weeks    PT Treatment/Interventions ADLs/Self Care Home Management;Moist Heat;Ultrasound;Cryotherapy;Gait training;Stair training;Functional mobility training;Therapeutic activities;Therapeutic exercise;Balance training;Neuromuscular re-education;Patient/family education;Manual techniques;Passive range of motion;Taping    PT Next Visit Plan Great toe ROM and LE strength, pain modulation    PT Home Exercise Plan [id number]    Consulted and Agree with Plan of Care Patient             Patient will benefit from  skilled therapeutic intervention in order to improve the following deficits and impairments:  Improper body mechanics, Increased muscle spasms, Decreased strength, Pain, Postural dysfunction  Visit Diagnosis: Pain in left foot  Other abnormalities of gait and mobility  Stiffness of left ankle, not elsewhere classified  Muscle weakness (generalized)     Problem List Patient Active Problem List   Diagnosis Date Noted   Osteomyelitis of toe of right foot ([place])    Wound dehiscence    PICC (peripherally inserted central catheter) in place 11/23/2019   Medication monitoring encounter 11/23/2019   Osteomyelitis of ankle or foot, acute, right ([place]) 11/22/2019   Superficial venous thrombosis of arm, right 11/02/2019    Abscess of bursa of left foot 10/14/2019   Foot abscess, left 10/14/2019   Wound infection    Unilateral primary osteoarthritis, left knee 04/27/2019   Chondromalacia patellae, left knee 07/02/2016   Arthritis of left knee 02/25/2016   Vaginitis and vulvovaginitis, unspecified 08/08/2013   Leiomyoma of uterus, unspecified 08/08/2013   Atypical chest pain 03/13/2013   Abnormal ECG 03/13/2013   Obesity, unspecified 03/13/2013   Hypertension     Vanessa [place], PT, DPT 11/05/20 12:52 PM   [facility] [address] [place], [name], [address] Phone: [phone]   Fax:  [phone]  Name: Alexis Ball MRN: [id number] Date of Birth: 1955/07/27

## 2020-11-05 NOTE — Patient Instructions (Signed)
HEP reviewed with pt. Reinforced great toe ROM exercises.

## 2020-11-12 ENCOUNTER — Other Ambulatory Visit: Payer: Self-pay

## 2020-11-12 ENCOUNTER — Ambulatory Visit: Payer: Medicare Other | Attending: Podiatry

## 2020-11-12 DIAGNOSIS — R2689 Other abnormalities of gait and mobility: Secondary | ICD-10-CM | POA: Diagnosis present

## 2020-11-12 DIAGNOSIS — M6281 Muscle weakness (generalized): Secondary | ICD-10-CM | POA: Diagnosis present

## 2020-11-12 DIAGNOSIS — M25672 Stiffness of left ankle, not elsewhere classified: Secondary | ICD-10-CM | POA: Insufficient documentation

## 2020-11-12 DIAGNOSIS — M79672 Pain in left foot: Secondary | ICD-10-CM | POA: Insufficient documentation

## 2020-11-12 NOTE — Therapy (Signed)
[place], [name], [address] Phone: [phone]   Fax:  [phone]  Physical Therapy Treatment/ Discharge Summary  Patient Details  Name: Alexis Ball MRN: [id number] Date of Birth: 1955/04/26 Referring Provider (PT): Evelina Bucy, DPM   Encounter Date: 11/12/2020   PT End of Session - 11/12/20 1242     Visit Number 13    Number of Visits 13    Date for PT Re-Evaluation 11/12/20    Authorization Type mcr: Kx mod at 15th visit  FOTO 6th at 10th    PT Start Time 1215    PT Stop Time 1250    PT Time Calculation (min) 35 min    Activity Tolerance Patient tolerated treatment well;Patient limited by pain    Behavior During Therapy [facility] for tasks assessed/performed             Past Medical History:  Diagnosis Date   Allergic rhinitis    Ambulates with cane    Full dentures    GERD (gastroesophageal reflux disease)    occasional , takes tums   Glaucoma, both eyes    Heart murmur    evalulated by cardiologist--- dr h. Johney Frame 01-07-2020 note in epic, echo done 01-31-2020 normal w/ no valve abnormalities   History of cellulitis    2006  LLE   History of chest pain    had cardiac cath 10-16-2004 (in epic)  showed normal coronaries that are tortous, patent renal arteries, normal LVF with ef 50-55%, noncardiac chest pain;  pt had ED visit 12-07-2019 negative work-up referred to cardiology, was seen by dr Johney Frame 01-07-2020 atypical cp / non-cardiac echo ordered / no further work-up   History of CVA (cerebrovascular accident) without residual deficits    11-29-2019  per pt happened in 2007 and has not had cva/ tia sympomts since   History of DVT of lower extremity    06-26-2020  per pt many years ago had RLE DVT completed blood thinner treatement stated not other blood clot until 07/ 2021 RUE with superficial venous thrombosis assosiated with PICC line which was removed   History of  osteomyelitis 10/2019   left foot  post op bunionectomy 06/ 2021   Hypertension    followed by pcp   IDA (iron deficiency anemia)    Migraines    11-29-2019 per pt previously had botox injection for prevention, last had 3 yrs ago,  now uses breathing , meditation, and minimizes triggers (bright light/ loud noise)   Neuropathy    feet   OA (osteoarthritis)    Vascular dementia ([place])    per pcp h&p 06-18-2020 with mild cognitive impairment take, namenda with improvement   Vitamin D deficiency    Wears glasses     Past Surgical History:  Procedure Laterality Date   BONE BIOPSY Left 10/14/2019   Procedure: BONE BIOPSY PROXIMAL PHALANX;  Surgeon: Evelina Bucy, DPM;  Location: [place];  Service: Podiatry;  Laterality: Left;   BONE BIOPSY Left 04-30-2020  _0    x2  on left foot   BONE BIOPSY Left 07/02/2020   Procedure: BONE BIOPSY - FROZEN SECTION;  Surgeon: Evelina Bucy, DPM;  Location: [place];  Service: Podiatry;  Laterality: Left;   BUNIONECTOMY Left 10-05-2019  dr Milinda Pointer _1    great toe   CARDIAC CATHETERIZATION  10-16-2004  dr Einar Gip   normal coronaries (tortuous), normal LVF   CATARACT EXTRACTION W/ INTRAOCULAR LENS  IMPLANT, BILATERAL  2013   COLONOSCOPY  last one 04-23-2009  dr d. Sharlett Iles   FOOT SURGERY Right unsure date   bunionectomy and hammertoe   HARDWARE REMOVAL Right 06/ 2015  _0    right foot s/p bunionectomy/ hammertoe conrrection   HARDWARE REMOVAL Left 12/05/2019   Procedure: HARDWARE REMOVAL ,WOUND CLOSURE OF FIRST TOE;  Surgeon: Evelina Bucy, DPM;  Location: [place];  Service: Podiatry;  Laterality: Left;   IMPLANT OF A SILASTIC METATARSAL PHALANGE JOINT Left 07/02/2020   Procedure: IMPLANT OF A SILASTIC METATARSAL PHALANGE JOINT WITH REMOVAL OF ANTIBIOTIC SPACER;CAPSULAR REPAIR WITH TISSUE SUBSTITUTE;  Surgeon: Evelina Bucy, DPM;  Location: [place];  Service: Podiatry;  Laterality: Left;    IRRIGATION AND DEBRIDEMENT FOOT Left 10/14/2019   Procedure: INCISION AND DRAINAGE FOR LEFT FOOT INFECTION, COMPLEX;  Surgeon: Evelina Bucy, DPM;  Location: [place];  Service: Podiatry;  Laterality: Left;   IRRIGATION AND DEBRIDEMENT FOOT Left 10/17/2019   Procedure: Debridement and Irrigation of Left Foot; Removal of Silicone Implant; Insertion of Antibiotic Spacer; Application of External Fixator; Possible Application of Wound VAC;  Surgeon: Evelina Bucy, DPM;  Location: [place];  Service: Podiatry;  Laterality: Left;  Pre-op Block POP/SAPH; MAC   KNEE ARTHROSCOPY Left 2018   ORIF CALCANEOUS FRACTURE Left 10/17/2019   Procedure: Application External Fixation;  Surgeon: Evelina Bucy, DPM;  Location: [place];  Service: Podiatry;  Laterality: Left;    There were no vitals filed for this visit.   Subjective Assessment - 11/12/20 1219     Subjective Pt reports increased soreness today after "working out too hard" this past weekend. She reports continued occasional shooting pain into her L foot. She adds that she was able to walk around her block yesterday without difficulty. She reports continued adherence to her HEP.    Limitations Standing;Walking    How long can you sit comfortably? Unlimited    How long can you stand comfortably? 20 min    How long can you walk comfortably? 30 min with [facility]    Diagnostic tests 07/03/2020   Expected postoperative changes without complicating features    Currently in Pain? Yes    Pain Score 5     Pain Location Foot    Pain Orientation Left    Pain Descriptors / Indicators Sharp;Cramping;Constant    Pain Type Surgical pain;Chronic pain    Pain Onset More than a month ago    Pain Frequency Intermittent                [facility] PT Assessment - 11/12/20 0001       Observation/Other Assessments   Focus on Therapeutic Outcomes (FOTO)  49%      AROM   Overall AROM Comments 5 great toe extension    Left Ankle Dorsiflexion 15    Left Ankle Plantar Flexion  40    Left Ankle Inversion 34    Left Ankle Eversion 8      PROM   Overall PROM Comments 45 great toe extension    Left Ankle Dorsiflexion 15                           [facility] Adult PT Treatment/Exercise - 11/12/20 0001       Cryotherapy   Number Minutes Cryotherapy 10 Minutes    Cryotherapy Location Ankle    Type of Cryotherapy Ice pack   per pt request  PT Education - 11/12/20 1241     Education Details Reviewed HEP with pt with emphasis on great toe extension ROM.    Person(s) Educated Patient    Methods Explanation;Demonstration    Comprehension Verbalized understanding;Returned demonstration              PT Short Term Goals - 10/29/20 1259       PT SHORT TERM GOAL #1   Title pt to be IND with inital HEP    Time 3    Period Weeks    Status Achieved    Target Date 09/22/20      PT SHORT TERM GOAL #2   Title -               PT Long Term Goals - 11/12/20 1250       PT LONG TERM GOAL #1   Title increase L ankle dF to >/= 6 degrees for ROM required for efficient gait pattern.    Baseline 15 degrees on 11/12/2020    Time 6    Period Weeks    Status Achieved      PT LONG TERM GOAL #2   Title increase great toe extension to >/= 60 degrees AROM to promote toe off pattern of gait    Baseline 5 degrees on 11/12/2020    Time 6    Period Weeks    Status Not Met      PT LONG TERM GOAL #3   Title increase gross ankle strength to >/= 5/5 in all planes to promote stability and maximize safety    Baseline 5/5 in all planes, painful with resisted eversion    Time 6    Period Weeks    Status Achieved      PT LONG TERM GOAL #4   Title increase FOTO score to >/= 57% to demo improvement in function    Baseline 49% on 11/12/2020    Time 6    Period Weeks    Status Not Met      PT LONG TERM GOAL #5   Title pt to be able to walk and stand for >/= 45 min with LRAD for endurnace required for in home and community amb with  </= 2/10 max pain    Baseline pt can stand for 20 minutes and walk for 30 minutes with SPC    Time 6    Period Weeks    Status Partially Met      PT LONG TERM GOAL #6   Title pt to be IND with all HEP and is able to maintain and progress current LOF IND    Baseline Pt reported adherence on 11/12/2020    Time 6    Period Weeks    Status Achieved                   Plan - 11/12/20 1246     Clinical Impression Statement Upon reassessment, the pt has made progress in her FOTO score, although it has not returned to the initial baseline taken on 08/25/2020. Additionally, the pt has not made progress in L great toe extension AROM. She has made meaningful progress in L ankle strength and AROM, as well as functional standing and walking since starting PT. However, she has plateaued in her progress made in PT. As such, she is discharged from PT at this time. It is recommended that she follow up with her PCP and podiatrist to address her remaining deficits. The pt was  also instructed to continue her HEP regularly to continue to independently address her primary impairments.    Personal Factors and Comorbidities Age;Comorbidity 3+    Comorbidities hx of stroke, OA, HTN    Stability/Clinical Decision Making Evolving/Moderate complexity    Clinical Decision Making Moderate    PT Next Visit Plan Pt is D/C from PT at this time    PT Home Exercise Plan [id number]    Consulted and Agree with Plan of Care Patient             Patient will benefit from skilled therapeutic intervention in order to improve the following deficits and impairments:  Improper body mechanics, Increased muscle spasms, Decreased strength, Pain, Postural dysfunction  Visit Diagnosis: Pain in left foot  Other abnormalities of gait and mobility  Stiffness of left ankle, not elsewhere classified  Muscle weakness (generalized)     Problem List Patient Active Problem List   Diagnosis Date Noted   Osteomyelitis of toe  of right foot ([place])    Wound dehiscence    PICC (peripherally inserted central catheter) in place 11/23/2019   Medication monitoring encounter 11/23/2019   Osteomyelitis of ankle or foot, acute, right ([place]) 11/22/2019   Superficial venous thrombosis of arm, right 11/02/2019   Abscess of bursa of left foot 10/14/2019   Foot abscess, left 10/14/2019   Wound infection    Unilateral primary osteoarthritis, left knee 04/27/2019   Chondromalacia patellae, left knee 07/02/2016   Arthritis of left knee 02/25/2016   Vaginitis and vulvovaginitis, unspecified 08/08/2013   Leiomyoma of uterus, unspecified 08/08/2013   Atypical chest pain 03/13/2013   Abnormal ECG 03/13/2013   Obesity, unspecified 03/13/2013   Hypertension      [facility] [address] [place], [name], [address] Phone: [phone]   Fax:  [phone]  Name: Alexis Ball MRN: [id number] Date of Birth: [birth year]  PHYSICAL THERAPY DISCHARGE SUMMARY  Visits from Start of Care: 13  Current functional level related to goals / functional outcomes: Pt has not reached her L great toe extension goal. She is also still limited in functional gait due to foot pain.   Remaining deficits: L toe extension AROM, functional standing/ walking   Education / Equipment: Robust HEP   Patient agrees to discharge. Patient goals were partially met. Patient is being discharged due to maximized rehab potential.   Vanessa Belle Meade, PT, DPT 11/12/20 1:01 PM

## 2020-11-12 NOTE — Patient Instructions (Signed)
HEP reviewed with pt.

## 2020-11-18 ENCOUNTER — Ambulatory Visit (INDEPENDENT_AMBULATORY_CARE_PROVIDER_SITE_OTHER): Payer: Medicare Other | Admitting: Podiatry

## 2020-11-18 ENCOUNTER — Telehealth: Payer: Self-pay | Admitting: *Deleted

## 2020-11-18 ENCOUNTER — Ambulatory Visit (INDEPENDENT_AMBULATORY_CARE_PROVIDER_SITE_OTHER): Payer: Medicare Other

## 2020-11-18 ENCOUNTER — Other Ambulatory Visit: Payer: Self-pay

## 2020-11-18 DIAGNOSIS — M205X2 Other deformities of toe(s) (acquired), left foot: Secondary | ICD-10-CM

## 2020-11-18 DIAGNOSIS — E1142 Type 2 diabetes mellitus with diabetic polyneuropathy: Secondary | ICD-10-CM | POA: Diagnosis not present

## 2020-11-18 MED ORDER — OXYCODONE-ACETAMINOPHEN 10-325 MG PO TABS: 1.0000 | ORAL_TABLET | ORAL | 0 refills | Status: DC | PRN

## 2020-11-18 MED ORDER — IBUPROFEN 600 MG PO TABS: 600.0000 mg | ORAL_TABLET | Freq: Three times a day (TID) | ORAL | 2 refills | Status: DC | PRN

## 2020-11-18 NOTE — Telephone Encounter (Signed)
Please reroute medication to [facility] [address]

## 2020-11-18 NOTE — Telephone Encounter (Signed)
[facility] pharmacy is calling and they unable to order any Percocet 10-325 mg tablets at this time. Is best for patient to have prescription sent to another pharmacy.  Called the patient to inform, no answer but left a vmessage for a call back to give [place] a different pharmacy location.

## 2020-11-18 NOTE — Progress Notes (Signed)
  Subjective:  Patient ID: Alexis Ball, female    DOB: 1955/08/05,  MRN: [id number]  Chief Complaint  Patient presents with   Follow-up    Joint replacement f/u   DOS: 07/02/20 Procedure: Left foot removal of hardware, bone biopsy, first metatarsophalangeal joint replacement with implant  65 y.o. female presents with the above complaint. History confirmed with patient. She is overall doing ok. She states pain worst at the end of the day in the big toe area. Physical therapy has helped but she is almost maxed out. Sometimes pain in the lesser metatarsal area, pad did not help.  Objective:  Physical Exam: no tenderness at the surgical site, local edema noted, and calf supple, nontender.Good range of motion noted at the first metatarsophalangeal joint -5 degrees of dorsiflexion noted Incision: healed Assessment:   1. Hallux limitus of left foot   2. Diabetic peripheral neuropathy associated with type 2 diabetes mellitus ([place])    Plan:  Patient was evaluated and treated and all questions answered.  Post-operative State -She continues to improve. I do think this will be a long process to get back to baseline -XR taken and reviewed stable implant positioning. No acute fractures. Elongated 2nd/3rd metatarsals noted. -Final pain med refill. No further Rx. -XRs needed at follow-up: none  Return in about 2 months (around 01/18/2021) for Hallux limitus f/u.

## 2020-11-18 NOTE — Addendum Note (Signed)
Addended by: Hardie Pulley on: 11/18/2020 01:14 PM   Modules accepted: Orders

## 2020-12-17 ENCOUNTER — Other Ambulatory Visit: Payer: Self-pay

## 2020-12-17 ENCOUNTER — Encounter (HOSPITAL_COMMUNITY): Payer: Self-pay | Admitting: Emergency Medicine

## 2020-12-17 ENCOUNTER — Emergency Department (HOSPITAL_COMMUNITY): Payer: Medicare Other

## 2020-12-17 ENCOUNTER — Emergency Department (HOSPITAL_COMMUNITY)
Admission: EM | Admit: 2020-12-17 | Discharge: 2020-12-17 | Disposition: A | Discharge status: Home / Self Care | Payer: Medicare Other | Attending: Emergency Medicine | Admitting: Emergency Medicine

## 2020-12-17 DIAGNOSIS — M25551 Pain in right hip: Secondary | ICD-10-CM | POA: Insufficient documentation

## 2020-12-17 DIAGNOSIS — Z79899 Other long term (current) drug therapy: Secondary | ICD-10-CM | POA: Diagnosis not present

## 2020-12-17 DIAGNOSIS — I10 Essential (primary) hypertension: Secondary | ICD-10-CM | POA: Insufficient documentation

## 2020-12-17 DIAGNOSIS — M25559 Pain in unspecified hip: Secondary | ICD-10-CM

## 2020-12-17 MED ORDER — CYCLOBENZAPRINE HCL 10 MG PO TABS: 10.0000 mg | ORAL_TABLET | Freq: Two times a day (BID) | ORAL | 0 refills | Status: DC | PRN

## 2020-12-17 NOTE — Discharge Instructions (Addendum)
Consider buying over-the-counter lidocaine patches.  Take muscle relaxant Flexeril as prescribed.  Recommend taking 400 mg ibuprofen as needed as well.  Recommend ice and follow-up with primary care doctor.  There is no fracture found on your x-ray today.  Overall suspect that you have a bone bruise may be even a muscle spasm.

## 2020-12-17 NOTE — ED Provider Notes (Signed)
[facility] EMERGENCY DEPARTMENT Provider Note   CSN: [id number] Arrival date & time: 12/17/20  4970     History Chief Complaint  Patient presents with   Hip Pain     Alexis Ball is a 65 y.o. female.  Patient states that she fell and landed on her right hip 5 days ago.  Having some intermittent pain.  Able to get around with her cane.  Has not taken anything to help with the pain.  The history is provided by the patient.  Hip Pain This is a new problem. Episode onset: 5 days ago. The problem occurs daily. The symptoms are aggravated by walking. Nothing relieves the symptoms. She has tried nothing for the symptoms. The treatment provided no relief.      Past Medical History:  Diagnosis Date   Allergic rhinitis    Ambulates with cane    Full dentures    GERD (gastroesophageal reflux disease)    occasional , takes tums   Glaucoma, both eyes    Heart murmur    evalulated by cardiologist--- dr h. Johney Frame 01-07-2020 note in epic, echo done 01-31-2020 normal w/ no valve abnormalities   History of cellulitis    2006  LLE   History of chest pain    had cardiac cath 10-16-2004 (in epic)  showed normal coronaries that are tortous, patent renal arteries, normal LVF with ef 50-55%, noncardiac chest pain;  pt had ED visit 12-07-2019 negative work-up referred to cardiology, was seen by dr Johney Frame 01-07-2020 atypical cp / non-cardiac echo ordered / no further work-up   History of CVA (cerebrovascular accident) without residual deficits    11-29-2019  per pt happened in 2007 and has not had cva/ tia sympomts since   History of DVT of lower extremity    06-26-2020  per pt many years ago had RLE DVT completed blood thinner treatement stated not other blood clot until 07/ 2021 RUE with superficial venous thrombosis assosiated with PICC line which was removed   History of osteomyelitis 10/2019   left foot  post op bunionectomy 06/ 2021   Hypertension    followed  by pcp   IDA (iron deficiency anemia)    Migraines    11-29-2019 per pt previously had botox injection for prevention, last had 3 yrs ago,  now uses breathing , meditation, and minimizes triggers (bright light/ loud noise)   Neuropathy    feet   OA (osteoarthritis)    Vascular dementia ([place])    per pcp h&p 06-18-2020 with mild cognitive impairment take, namenda with improvement   Vitamin D deficiency    Wears glasses     Patient Active Problem List   Diagnosis Date Noted   Osteomyelitis of toe of right foot ([place])    Wound dehiscence    PICC (peripherally inserted central catheter) in place 11/23/2019   Medication monitoring encounter 11/23/2019   Osteomyelitis of ankle or foot, acute, right ([place]) 11/22/2019   Superficial venous thrombosis of arm, right 11/02/2019   Abscess of bursa of left foot 10/14/2019   Foot abscess, left 10/14/2019   Wound infection    Unilateral primary osteoarthritis, left knee 04/27/2019   Chondromalacia patellae, left knee 07/02/2016   Arthritis of left knee 02/25/2016   Vaginitis and vulvovaginitis, unspecified 08/08/2013   Leiomyoma of uterus, unspecified 08/08/2013   Atypical chest pain 03/13/2013   Abnormal ECG 03/13/2013   Obesity, unspecified 03/13/2013   Hypertension     Past Surgical History:  Procedure Laterality Date   BONE BIOPSY Left 10/14/2019   Procedure: BONE BIOPSY PROXIMAL PHALANX;  Surgeon: Evelina Bucy, DPM;  Location: [place];  Service: Podiatry;  Laterality: Left;   BONE BIOPSY Left 04-30-2020  _0    x2  on left foot   BONE BIOPSY Left 07/02/2020   Procedure: BONE BIOPSY - FROZEN SECTION;  Surgeon: Evelina Bucy, DPM;  Location: [place];  Service: Podiatry;  Laterality: Left;   BUNIONECTOMY Left 10-05-2019  dr Milinda Pointer _1    great toe   CARDIAC CATHETERIZATION  10-16-2004  dr Einar Gip   normal coronaries (tortuous), normal LVF   CATARACT EXTRACTION W/ INTRAOCULAR LENS  IMPLANT, BILATERAL  2013    COLONOSCOPY  last one 04-23-2009  dr d. Sharlett Iles   FOOT SURGERY Right unsure date   bunionectomy and hammertoe   HARDWARE REMOVAL Right 06/ 2015  _2    right foot s/p bunionectomy/ hammertoe conrrection   HARDWARE REMOVAL Left 12/05/2019   Procedure: HARDWARE REMOVAL ,WOUND CLOSURE OF FIRST TOE;  Surgeon: Evelina Bucy, DPM;  Location: [place];  Service: Podiatry;  Laterality: Left;   IMPLANT OF A SILASTIC METATARSAL PHALANGE JOINT Left 07/02/2020   Procedure: IMPLANT OF A SILASTIC METATARSAL PHALANGE JOINT WITH REMOVAL OF ANTIBIOTIC SPACER;CAPSULAR REPAIR WITH TISSUE SUBSTITUTE;  Surgeon: Evelina Bucy, DPM;  Location: [place];  Service: Podiatry;  Laterality: Left;   IRRIGATION AND DEBRIDEMENT FOOT Left 10/14/2019   Procedure: INCISION AND DRAINAGE FOR LEFT FOOT INFECTION, COMPLEX;  Surgeon: Evelina Bucy, DPM;  Location: [place];  Service: Podiatry;  Laterality: Left;   IRRIGATION AND DEBRIDEMENT FOOT Left 10/17/2019   Procedure: Debridement and Irrigation of Left Foot; Removal of Silicone Implant; Insertion of Antibiotic Spacer; Application of External Fixator; Possible Application of Wound VAC;  Surgeon: Evelina Bucy, DPM;  Location: [place];  Service: Podiatry;  Laterality: Left;  Pre-op Block POP/SAPH; MAC   KNEE ARTHROSCOPY Left 2018   ORIF CALCANEOUS FRACTURE Left 10/17/2019   Procedure: Application External Fixation;  Surgeon: Evelina Bucy, DPM;  Location: [place];  Service: Podiatry;  Laterality: Left;     OB History     Gravida  3   Para  3   Term  3   Preterm      AB      Living  3      SAB      IAB      Ectopic      Multiple      Live Births  3           Family History  Problem Relation Age of Onset   Hypertension Mother     Social History   Tobacco Use   Smoking status: Never   Smokeless tobacco: Never  Vaping Use   Vaping Use: Never used  Substance Use Topics   Alcohol use: No   Drug use: No     Home Medications Prior to Admission medications   Medication Sig Start Date End Date Taking? Authorizing Provider  cyclobenzaprine (FLEXERIL) 10 MG tablet Take 1 tablet (10 mg total) by mouth 2 (two) times daily as needed for muscle spasms. 12/17/20  Yes Nazaria Riesen, DO  amLODipine (NORVASC) 5 MG tablet Take 1 tablet (5 mg total) by mouth daily. Patient taking differently: Take 5 mg by mouth at bedtime. 01/07/20 04/06/20  Freada Bergeron, MD  azelastine (ASTELIN) 0.1 % nasal spray SPRAY 1 SPRAY IN [facility]  NOSTRIL BY INTRANASAL ROUTE 2 TIMES PER DAY FOR 30 DAYS 06/16/20   [provider]  bimatoprost (LUMIGAN) 0.01 % SOLN Place 1 drop into both eyes at bedtime.     [provider]  brimonidine (ALPHAGAN) 0.15 % ophthalmic solution Place 1 drop into both eyes 2 (two) times daily.     [provider]  calcium carbonate (TUMS - DOSED IN MG ELEMENTAL CALCIUM) 500 MG chewable tablet Chew 1 tablet by mouth as needed for indigestion or heartburn.    [provider]  cephALEXin (KEFLEX) 500 MG capsule Take 1 capsule (500 mg total) by mouth 2 (two) times daily. 07/02/20   Evelina Bucy, DPM  cetirizine (ZYRTEC) 10 MG tablet Take 10 mg by mouth daily.    [provider]  dorzolamide-timolol (COSOPT) 22.3-6.8 MG/ML ophthalmic solution Place 1 drop into both eyes 2 (two) times daily.  05/14/13   [provider]  ferrous sulfate 325 (65 FE) MG tablet Take 325 mg by mouth daily with breakfast.    [provider]  gabapentin (NEURONTIN) 300 MG capsule Take 1 capsule (300 mg total) by mouth 3 (three) times daily. One in the morning and two at bedtime. Patient taking differently: Take 300 mg by mouth 2 (two) times daily. One in the morning and two at bedtime. 01/22/20   Evelina Bucy, DPM  hydrochlorothiazide (HYDRODIURIL) 25 MG tablet Take 25 mg by mouth daily.    [provider]  ibuprofen (ADVIL) 600 MG tablet Take 1 tablet (600 mg  total) by mouth every 8 (eight) hours as needed. 11/18/20   Evelina Bucy, DPM  memantine (NAMENDA) 10 MG tablet Take 10 mg by mouth 2 (two) times daily. 03/17/20   [provider]  Multiple Vitamin (MULTIVITAMIN) capsule Take 1 capsule by mouth daily.    [provider]  NARCAN 4 MG/0.1ML LIQD nasal spray kit SMARTSIG:1 Spray(s) Both Nares 12/28/19   [provider]  omega-3 acid ethyl esters (LOVAZA) 1 g capsule Take 1 g by mouth daily. 11/29/19   [provider]  Omega-3 Fatty Acids (FISH OIL) 1000 MG CAPS Take 1,000 mg by mouth daily.    [provider]  ondansetron (ZOFRAN) 4 MG tablet Take 1 tablet (4 mg total) by mouth every 8 (eight) hours as needed for nausea or vomiting. 07/02/20   Evelina Bucy, DPM  oxyCODONE-acetaminophen (PERCOCET) 10-325 MG tablet Take 1 tablet by mouth every 4 (four) hours as needed for pain. 11/18/20   Evelina Bucy, DPM  Polyvinyl Alcohol-Povidone (REFRESH OP) Place 1 drop into both eyes 3 (three) times daily as needed (dry eyes).     [provider]  potassium chloride SA (K-DUR,KLOR-CON) 20 MEQ tablet Take 20 mEq by mouth daily.    [provider]  rosuvastatin (CRESTOR) 10 MG tablet Take 1 tablet (10 mg total) by mouth daily. Patient taking differently: Take 10 mg by mouth daily. 01/07/20 04/06/20  Freada Bergeron, MD  silver sulfADIAZINE (SILVADENE) 1 % cream Apply pea-sized amount to wound daily. 12/25/19   Evelina Bucy, DPM    Allergies    Iodine  Review of Systems   Review of Systems  Constitutional:  Negative for fever.  Cardiovascular:  Negative for leg swelling.  Musculoskeletal:  Positive for arthralgias and gait problem. Negative for back pain, joint swelling, myalgias, neck pain and neck stiffness.  Skin:  Negative for wound.  Neurological:  Negative for weakness and numbness.  Physical Exam Updated Vital Signs BP 129/72 (BP Location: Left Arm)   Pulse 65   Temp 98.1  F (36.7 C) (Oral)   Resp 16   Ht _0  (1.575 m)   Wt 88.5 kg   SpO2 100%   BMI 35.67 kg/m   Physical Exam Constitutional:      General: She is not in acute distress.    Appearance: She is not ill-appearing.  Cardiovascular:     Pulses: Normal pulses.  Musculoskeletal:        General: Tenderness present. No swelling or deformity. Normal range of motion.     Cervical back: Normal range of motion and neck supple. No tenderness.     Comments: Some tenderness to the right hip area but good range of motion without any discomfort  Neurological:     General: No focal deficit present.     Mental Status: She is alert.     Sensory: No sensory deficit.     Motor: No weakness.    ED Results / Procedures / Treatments   Labs (all labs ordered are listed, but only abnormal results are displayed) Labs Reviewed - No data to display  EKG None  Radiology DG Hip Unilat W or Wo Pelvis 2-3 Views Right  Result Date: 12/17/2020 CLINICAL DATA:  Fall, pain EXAM: DG HIP (WITH OR WITHOUT PELVIS) 2-3V RIGHT COMPARISON:  None. FINDINGS: There is no acute fracture or dislocation. Femoroacetabular alignment is maintained. There is moderate right and mild left degenerative change about the hips. The SI joints and symphysis pubis are intact. IMPRESSION: No acute fracture or dislocation. Right worse than left degenerative changes. Electronically Signed   By: Valetta Mole M.D.   On: 12/17/2020 09:10    Procedures Procedures   Medications Ordered in ED Medications - No data to display  ED Course  I have reviewed the triage vital signs and the nursing notes.  Pertinent labs & imaging results that were available during my care of the patient were reviewed by me and considered in my medical decision making (see chart for details).    MDM Rules/Calculators/A&P                           Effie Berkshire Postle is here with right hip pain after fall 5 days ago.  Normal vitals.  No fever.  X-ray done prior to  my evaluation shows no fracture or dislocation.  She does have significant arthritic changes in her hip.  Worse on the right when compared to the left.  Will prescribe Flexeril for any kind of muscle spasm.  Recommend that she take lidocaine patches over-the-counter as well as ibuprofen.  She states that she cannot take Tylenol.  Recommend follow-up with primary care doctor.  She has been ambulatory with her cane.  Discharged in good condition.  Understands return precautions.  Neurovascular neuromuscularly intact.  No back pain.  No concern for cauda equina or other spinal process.  This chart was dictated using voice recognition software.  Despite best efforts to proofread,  errors can occur which can change the documentation meaning.   Final Clinical Impression(s) / ED Diagnoses Final diagnoses:  Hip pain    Rx / DC Orders ED Discharge Orders          Ordered    cyclobenzaprine (FLEXERIL) 10 MG tablet  2 times daily PRN        12/17/20 1155  Lennice Sites, DO 12/17/20 1432

## 2020-12-17 NOTE — ED Triage Notes (Signed)
Pt here from home with c/o right hip after a fall last Saturday able to walk

## 2020-12-30 ENCOUNTER — Ambulatory Visit (INDEPENDENT_AMBULATORY_CARE_PROVIDER_SITE_OTHER): Payer: Medicare Other | Admitting: Podiatry

## 2020-12-30 ENCOUNTER — Encounter: Payer: Self-pay | Admitting: Podiatry

## 2020-12-30 ENCOUNTER — Other Ambulatory Visit: Payer: Self-pay

## 2020-12-30 DIAGNOSIS — G8918 Other acute postprocedural pain: Secondary | ICD-10-CM

## 2020-12-30 DIAGNOSIS — M79674 Pain in right toe(s): Secondary | ICD-10-CM | POA: Diagnosis not present

## 2020-12-30 DIAGNOSIS — M79675 Pain in left toe(s): Secondary | ICD-10-CM | POA: Diagnosis not present

## 2020-12-30 DIAGNOSIS — B351 Tinea unguium: Secondary | ICD-10-CM | POA: Diagnosis not present

## 2020-12-30 DIAGNOSIS — L84 Corns and callosities: Secondary | ICD-10-CM | POA: Diagnosis not present

## 2020-12-30 DIAGNOSIS — E1142 Type 2 diabetes mellitus with diabetic polyneuropathy: Secondary | ICD-10-CM | POA: Diagnosis not present

## 2020-12-30 MED ORDER — OXYCODONE-ACETAMINOPHEN 5-325 MG PO TABS: 1.0000 | ORAL_TABLET | ORAL | 0 refills | Status: DC | PRN

## 2020-12-30 NOTE — Progress Notes (Signed)
  Subjective:  Patient ID: Alexis Ball, female    DOB: June 20, 1955,  MRN: [id number]  Alexis Ball presents to clinic today for at risk foot care with history of diabetic neuropathy and callus(es) of both feet and painful thick toenails that are difficult to trim. Painful toenails interfere with ambulation. Aggravating factors include wearing enclosed shoe gear. Pain is relieved with periodic professional debridement. Painful calluses are aggravated when weightbearing with and without shoegear. Pain is relieved with periodic professional debridement.  Patient did not check blood glucose today.She is requesting refill of Percoet by Dr. March Rummage on today's visit for chronic postoperative pain of left foot.  She notes no new pedal problems on today's visit.  PCP is Arthur Holms, NP. Patient does not recall date of last visit.  Allergies  Allergen Reactions   Iodine Itching    Per pt when put on skin    Review of Systems: Negative except as noted in the HPI. Objective:   Constitutional Alexis Ball is a pleasant 65 y.o. African American female, in NAD. AAO x 3.   Vascular Capillary refill time to digits immediate b/l. Palpable DP pulse(s) b/l lower extremities Palpable PT pulse(s) b/l lower extremities Pedal hair sparse. Lower extremity skin temperature gradient within normal limits. No pain with calf compression b/l. No cyanosis or clubbing noted.  Neurologic Normal speech. Oriented to person, place, and time. Pt has subjective symptoms of neuropathy. Protective sensation intact 5/5 intact bilaterally with 10g monofilament b/l. Vibratory sensation intact b/l.  Dermatologic Skin warm and supple b/l lower extremities. No open wounds b/l lower extremities. No interdigital macerations b/l lower extremities. Toenails 1-5 b/l elongated, discolored, dystrophic, thickened, crumbly with subungual debris and tenderness to dorsal palpation. Hyperkeratotic lesion(s) R hallux, submet head 1 right  foot, submet head 5 left foot, and submet head 5 right foot.  No erythema, no edema, no drainage, no fluctuance. Well healed longitudinal surgical scar dorsal aspect 1st metatarsal left foot.  Orthopedic: Normal muscle strength 5/5 to all lower extremity muscle groups bilaterally. Hammertoe(s) noted to the L 2nd toe, L 3rd toe, and L 4th toe.   Radiographs: None Assessment:   1. Pain due to onychomycosis of toenails of both feet   2. Callus   3. Postoperative pain   4. Diabetic peripheral neuropathy associated with type 2 diabetes mellitus ([place])    Plan:  Patient was evaluated and treated and all questions answered. Consent given for treatment as described below: -Examined patient. -Refill for Percocet 5/325 was sent to pharmacy by Dr. March Rummage. -Continue diabetic foot care principles: inspect feet daily, monitor glucose as recommended by PCP and/or Endocrinologist, and follow prescribed diet per PCP, Endocrinologist and/or dietician. -Patient to continue soft, supportive shoe gear daily. -Toenails 1-5 b/l were debrided in length and girth with sterile nail nippers and dremel without iatrogenic bleeding.  -Callus(es) R hallux, submet head 1 right foot, submet head 5 left foot, and submet head 5 right foot pared utilizing sterile scalpel blade without complication or incident. Total number debrided =4. -Patient to report any pedal injuries to medical professional immediately. -Patient/POA to call should there be question/concern in the interim.  Return in about 3 months (around 03/31/2021).  Marzetta Board, DPM

## 2021-01-05 ENCOUNTER — Encounter: Payer: Self-pay | Admitting: Podiatry

## 2021-01-15 ENCOUNTER — Encounter (HOSPITAL_COMMUNITY): Payer: Self-pay

## 2021-01-15 ENCOUNTER — Inpatient Hospital Stay (HOSPITAL_COMMUNITY): Payer: Medicare Other

## 2021-01-15 ENCOUNTER — Inpatient Hospital Stay (HOSPITAL_COMMUNITY)
Admission: EM | Admit: 2021-01-15 | Discharge: 2021-01-31 | DRG: 917 | Disposition: A | Discharge status: Hospice | Payer: Medicare Other | Attending: Internal Medicine | Admitting: Internal Medicine

## 2021-01-15 ENCOUNTER — Emergency Department (HOSPITAL_COMMUNITY): Payer: Medicare Other

## 2021-01-15 DIAGNOSIS — Z66 Do not resuscitate: Secondary | ICD-10-CM | POA: Diagnosis present

## 2021-01-15 DIAGNOSIS — Z7189 Other specified counseling: Secondary | ICD-10-CM | POA: Diagnosis not present

## 2021-01-15 DIAGNOSIS — G931 Anoxic brain damage, not elsewhere classified: Secondary | ICD-10-CM | POA: Diagnosis not present

## 2021-01-15 DIAGNOSIS — Z515 Encounter for palliative care: Secondary | ICD-10-CM

## 2021-01-15 DIAGNOSIS — Z8249 Family history of ischemic heart disease and other diseases of the circulatory system: Secondary | ICD-10-CM

## 2021-01-15 DIAGNOSIS — R7303 Prediabetes: Secondary | ICD-10-CM | POA: Diagnosis present

## 2021-01-15 DIAGNOSIS — Z711 Person with feared health complaint in whom no diagnosis is made: Secondary | ICD-10-CM | POA: Diagnosis not present

## 2021-01-15 DIAGNOSIS — G629 Polyneuropathy, unspecified: Secondary | ICD-10-CM | POA: Diagnosis present

## 2021-01-15 DIAGNOSIS — E871 Hypo-osmolality and hyponatremia: Secondary | ICD-10-CM | POA: Diagnosis not present

## 2021-01-15 DIAGNOSIS — R1310 Dysphagia, unspecified: Secondary | ICD-10-CM | POA: Diagnosis present

## 2021-01-15 DIAGNOSIS — Z20822 Contact with and (suspected) exposure to covid-19: Secondary | ICD-10-CM | POA: Diagnosis present

## 2021-01-15 DIAGNOSIS — A419 Sepsis, unspecified organism: Secondary | ICD-10-CM | POA: Diagnosis not present

## 2021-01-15 DIAGNOSIS — R410 Disorientation, unspecified: Secondary | ICD-10-CM | POA: Diagnosis not present

## 2021-01-15 DIAGNOSIS — R6521 Severe sepsis with septic shock: Secondary | ICD-10-CM

## 2021-01-15 DIAGNOSIS — Z789 Other specified health status: Secondary | ICD-10-CM | POA: Diagnosis not present

## 2021-01-15 DIAGNOSIS — G43909 Migraine, unspecified, not intractable, without status migrainosus: Secondary | ICD-10-CM | POA: Diagnosis present

## 2021-01-15 DIAGNOSIS — X58XXXA Exposure to other specified factors, initial encounter: Secondary | ICD-10-CM | POA: Diagnosis present

## 2021-01-15 DIAGNOSIS — Z9289 Personal history of other medical treatment: Secondary | ICD-10-CM

## 2021-01-15 DIAGNOSIS — G928 Other toxic encephalopathy: Secondary | ICD-10-CM | POA: Diagnosis present

## 2021-01-15 DIAGNOSIS — T405X1A Poisoning by cocaine, accidental (unintentional), initial encounter: Secondary | ICD-10-CM | POA: Diagnosis present

## 2021-01-15 DIAGNOSIS — G9341 Metabolic encephalopathy: Secondary | ICD-10-CM | POA: Diagnosis not present

## 2021-01-15 DIAGNOSIS — K7200 Acute and subacute hepatic failure without coma: Secondary | ICD-10-CM | POA: Diagnosis present

## 2021-01-15 DIAGNOSIS — N179 Acute kidney failure, unspecified: Secondary | ICD-10-CM | POA: Diagnosis not present

## 2021-01-15 DIAGNOSIS — G8250 Quadriplegia, unspecified: Secondary | ICD-10-CM | POA: Diagnosis not present

## 2021-01-15 DIAGNOSIS — Z9842 Cataract extraction status, left eye: Secondary | ICD-10-CM

## 2021-01-15 DIAGNOSIS — R4701 Aphasia: Secondary | ICD-10-CM | POA: Diagnosis present

## 2021-01-15 DIAGNOSIS — T50901A Poisoning by unspecified drugs, medicaments and biological substances, accidental (unintentional), initial encounter: Secondary | ICD-10-CM

## 2021-01-15 DIAGNOSIS — K219 Gastro-esophageal reflux disease without esophagitis: Secondary | ICD-10-CM | POA: Diagnosis present

## 2021-01-15 DIAGNOSIS — R34 Anuria and oliguria: Secondary | ICD-10-CM | POA: Diagnosis not present

## 2021-01-15 DIAGNOSIS — B961 Klebsiella pneumoniae [K. pneumoniae] as the cause of diseases classified elsewhere: Secondary | ICD-10-CM | POA: Diagnosis present

## 2021-01-15 DIAGNOSIS — I469 Cardiac arrest, cause unspecified: Secondary | ICD-10-CM

## 2021-01-15 DIAGNOSIS — G9340 Encephalopathy, unspecified: Secondary | ICD-10-CM

## 2021-01-15 DIAGNOSIS — M79669 Pain in unspecified lower leg: Secondary | ICD-10-CM | POA: Diagnosis not present

## 2021-01-15 DIAGNOSIS — J9601 Acute respiratory failure with hypoxia: Secondary | ICD-10-CM | POA: Diagnosis present

## 2021-01-15 DIAGNOSIS — Z79899 Other long term (current) drug therapy: Secondary | ICD-10-CM

## 2021-01-15 DIAGNOSIS — I10 Essential (primary) hypertension: Secondary | ICD-10-CM | POA: Diagnosis present

## 2021-01-15 DIAGNOSIS — S2232XA Fracture of one rib, left side, initial encounter for closed fracture: Secondary | ICD-10-CM | POA: Diagnosis present

## 2021-01-15 DIAGNOSIS — D509 Iron deficiency anemia, unspecified: Secondary | ICD-10-CM | POA: Diagnosis present

## 2021-01-15 DIAGNOSIS — Z961 Presence of intraocular lens: Secondary | ICD-10-CM | POA: Diagnosis present

## 2021-01-15 DIAGNOSIS — Z86718 Personal history of other venous thrombosis and embolism: Secondary | ICD-10-CM

## 2021-01-15 DIAGNOSIS — E872 Acidosis, unspecified: Secondary | ICD-10-CM | POA: Diagnosis present

## 2021-01-15 DIAGNOSIS — Z4659 Encounter for fitting and adjustment of other gastrointestinal appliance and device: Secondary | ICD-10-CM

## 2021-01-15 DIAGNOSIS — R638 Other symptoms and signs concerning food and fluid intake: Secondary | ICD-10-CM | POA: Diagnosis not present

## 2021-01-15 DIAGNOSIS — Z9841 Cataract extraction status, right eye: Secondary | ICD-10-CM

## 2021-01-15 DIAGNOSIS — E559 Vitamin D deficiency, unspecified: Secondary | ICD-10-CM | POA: Diagnosis present

## 2021-01-15 DIAGNOSIS — Y92008 Other place in unspecified non-institutional (private) residence as the place of occurrence of the external cause: Secondary | ICD-10-CM

## 2021-01-15 DIAGNOSIS — J150 Pneumonia due to Klebsiella pneumoniae: Secondary | ICD-10-CM | POA: Diagnosis present

## 2021-01-15 DIAGNOSIS — Z6839 Body mass index (BMI) 39.0-39.9, adult: Secondary | ICD-10-CM | POA: Diagnosis not present

## 2021-01-15 DIAGNOSIS — F0150 Vascular dementia without behavioral disturbance: Secondary | ICD-10-CM | POA: Diagnosis present

## 2021-01-15 DIAGNOSIS — Z91041 Radiographic dye allergy status: Secondary | ICD-10-CM

## 2021-01-15 DIAGNOSIS — J690 Pneumonitis due to inhalation of food and vomit: Secondary | ICD-10-CM | POA: Diagnosis present

## 2021-01-15 DIAGNOSIS — F1410 Cocaine abuse, uncomplicated: Secondary | ICD-10-CM | POA: Diagnosis present

## 2021-01-15 DIAGNOSIS — E876 Hypokalemia: Secondary | ICD-10-CM | POA: Diagnosis present

## 2021-01-15 DIAGNOSIS — E875 Hyperkalemia: Secondary | ICD-10-CM | POA: Diagnosis not present

## 2021-01-15 DIAGNOSIS — Z781 Physical restraint status: Secondary | ICD-10-CM

## 2021-01-15 DIAGNOSIS — Z8673 Personal history of transient ischemic attack (TIA), and cerebral infarction without residual deficits: Secondary | ICD-10-CM

## 2021-01-15 DIAGNOSIS — E860 Dehydration: Secondary | ICD-10-CM | POA: Diagnosis present

## 2021-01-15 DIAGNOSIS — R451 Restlessness and agitation: Secondary | ICD-10-CM | POA: Diagnosis not present

## 2021-01-15 DIAGNOSIS — R739 Hyperglycemia, unspecified: Secondary | ICD-10-CM | POA: Diagnosis present

## 2021-01-15 DIAGNOSIS — J9690 Respiratory failure, unspecified, unspecified whether with hypoxia or hypercapnia: Secondary | ICD-10-CM

## 2021-01-15 HISTORY — DX: Prediabetes: R73.03

## 2021-01-15 LAB — CBC
HCT: 34.9 % — ABNORMAL LOW (ref 36.0–46.0)
Hemoglobin: 11.2 g/dL — ABNORMAL LOW (ref 12.0–15.0)
MCH: 30.3 pg (ref 26.0–34.0)
MCHC: 32.1 g/dL (ref 30.0–36.0)
MCV: 94.3 fL (ref 80.0–100.0)
Platelets: 248 10*3/uL (ref 150–400)
RBC: 3.7 MIL/uL — ABNORMAL LOW (ref 3.87–5.11)
RDW: 12.7 % (ref 11.5–15.5)
WBC: 7.7 10*3/uL (ref 4.0–10.5)
nRBC: 0 % (ref 0.0–0.2)

## 2021-01-15 LAB — I-STAT CHEM 8, ED
BUN: 15 mg/dL (ref 8–23)
Calcium, Ion: 1.03 mmol/L — ABNORMAL LOW (ref 1.15–1.40)
Chloride: 107 mmol/L (ref 98–111)
Creatinine, Ser: 1.2 mg/dL — ABNORMAL HIGH (ref 0.44–1.00)
Glucose, Bld: 209 mg/dL — ABNORMAL HIGH (ref 70–99)
HCT: 32 % — ABNORMAL LOW (ref 36.0–46.0)
Hemoglobin: 10.9 g/dL — ABNORMAL LOW (ref 12.0–15.0)
Potassium: 4.2 mmol/L (ref 3.5–5.1)
Sodium: 139 mmol/L (ref 135–145)
TCO2: 22 mmol/L (ref 22–32)

## 2021-01-15 LAB — PROTIME-INR
INR: 1 (ref 0.8–1.2)
INR: 1.2 (ref 0.8–1.2)
Prothrombin Time: 13.3 seconds (ref 11.4–15.2)
Prothrombin Time: 14.8 seconds (ref 11.4–15.2)

## 2021-01-15 LAB — URINALYSIS, ROUTINE W REFLEX MICROSCOPIC
Bilirubin Urine: NEGATIVE
Glucose, UA: 50 mg/dL — AB
Ketones, ur: NEGATIVE mg/dL
Leukocytes,Ua: NEGATIVE
Nitrite: NEGATIVE
Protein, ur: 100 mg/dL — AB
Specific Gravity, Urine: 1.016 (ref 1.005–1.030)
pH: 6 (ref 5.0–8.0)

## 2021-01-15 LAB — COMPREHENSIVE METABOLIC PANEL
ALT: 434 U/L — ABNORMAL HIGH (ref 0–44)
AST: 358 U/L — ABNORMAL HIGH (ref 15–41)
Albumin: 3.4 g/dL — ABNORMAL LOW (ref 3.5–5.0)
Alkaline Phosphatase: 62 U/L (ref 38–126)
Anion gap: 12 (ref 5–15)
BUN: 14 mg/dL (ref 8–23)
CO2: 21 mmol/L — ABNORMAL LOW (ref 22–32)
Calcium: 8.6 mg/dL — ABNORMAL LOW (ref 8.9–10.3)
Chloride: 104 mmol/L (ref 98–111)
Creatinine, Ser: 1.29 mg/dL — ABNORMAL HIGH (ref 0.44–1.00)
GFR, Estimated: 47 mL/min — ABNORMAL LOW (ref >60)
Glucose, Bld: 214 mg/dL — ABNORMAL HIGH (ref 70–99)
Potassium: 4 mmol/L (ref 3.5–5.1)
Sodium: 137 mmol/L (ref 135–145)
Total Bilirubin: 0.9 mg/dL (ref 0.3–1.2)
Total Protein: 6.7 g/dL (ref 6.5–8.1)

## 2021-01-15 LAB — GLUCOSE, CAPILLARY
Glucose-Capillary: 121 mg/dL — ABNORMAL HIGH (ref 70–99)
Glucose-Capillary: 97 mg/dL (ref 70–99)

## 2021-01-15 LAB — LACTIC ACID, PLASMA
Lactic Acid, Venous: 3.5 mmol/L (ref 0.5–1.9)
Lactic Acid, Venous: 2.6 mmol/L (ref 0.5–1.9)
Lactic Acid, Venous: 1.3 mmol/L (ref 0.5–1.9)
Lactic Acid, Venous: 1.3 mmol/L (ref 0.5–1.9)

## 2021-01-15 LAB — RAPID URINE DRUG SCREEN, HOSP PERFORMED
Amphetamines: NOT DETECTED
Barbiturates: NOT DETECTED
Benzodiazepines: NOT DETECTED
Cocaine: POSITIVE — AB
Opiates: NOT DETECTED
Tetrahydrocannabinol: NOT DETECTED

## 2021-01-15 LAB — I-STAT ARTERIAL BLOOD GAS, ED
Acid-base deficit: 6 mmol/L — ABNORMAL HIGH (ref 0.0–2.0)
Acid-base deficit: 3 mmol/L — ABNORMAL HIGH (ref 0.0–2.0)
Bicarbonate: 18.5 mmol/L — ABNORMAL LOW (ref 20.0–28.0)
Bicarbonate: 22.6 mmol/L (ref 20.0–28.0)
Calcium, Ion: 1.14 mmol/L — ABNORMAL LOW (ref 1.15–1.40)
Calcium, Ion: 1.18 mmol/L (ref 1.15–1.40)
HCT: 33 % — ABNORMAL LOW (ref 36.0–46.0)
HCT: 36 % (ref 36.0–46.0)
Hemoglobin: 11.2 g/dL — ABNORMAL LOW (ref 12.0–15.0)
Hemoglobin: 12.2 g/dL (ref 12.0–15.0)
O2 Saturation: 96 %
O2 Saturation: 89 %
Patient temperature: 96.3
Patient temperature: 98.1
Potassium: 3.1 mmol/L — ABNORMAL LOW (ref 3.5–5.1)
Potassium: 2.9 mmol/L — ABNORMAL LOW (ref 3.5–5.1)
Sodium: 140 mmol/L (ref 135–145)
Sodium: 142 mmol/L (ref 135–145)
TCO2: 19 mmol/L — ABNORMAL LOW (ref 22–32)
TCO2: 24 mmol/L (ref 22–32)
pCO2 arterial: 29.7 mmHg — ABNORMAL LOW (ref 32.0–48.0)
pCO2 arterial: 43.2 mmHg (ref 32.0–48.0)
pH, Arterial: 7.396 (ref 7.350–7.450)
pH, Arterial: 7.326 — ABNORMAL LOW (ref 7.350–7.450)
pO2, Arterial: 80 mmHg — ABNORMAL LOW (ref 83.0–108.0)
pO2, Arterial: 61 mmHg — ABNORMAL LOW (ref 83.0–108.0)

## 2021-01-15 LAB — BASIC METABOLIC PANEL
Anion gap: 8 (ref 5–15)
BUN: 14 mg/dL (ref 8–23)
CO2: 23 mmol/L (ref 22–32)
Calcium: 8.4 mg/dL — ABNORMAL LOW (ref 8.9–10.3)
Chloride: 108 mmol/L (ref 98–111)
Creatinine, Ser: 1.06 mg/dL — ABNORMAL HIGH (ref 0.44–1.00)
GFR, Estimated: 59 mL/min — ABNORMAL LOW (ref >60)
Glucose, Bld: 118 mg/dL — ABNORMAL HIGH (ref 70–99)
Potassium: 3.9 mmol/L (ref 3.5–5.1)
Sodium: 139 mmol/L (ref 135–145)

## 2021-01-15 LAB — ECHOCARDIOGRAM COMPLETE
Area-P 1/2: 4.24 cm2
S' Lateral: 3.5 cm
Single Plane A4C EF: 38.2 %

## 2021-01-15 LAB — MAGNESIUM: Magnesium: 2.6 mg/dL — ABNORMAL HIGH (ref 1.7–2.4)

## 2021-01-15 LAB — LIPASE, BLOOD: Lipase: 29 U/L (ref 11–51)

## 2021-01-15 LAB — MRSA NEXT GEN BY PCR, NASAL: MRSA by PCR Next Gen: NOT DETECTED

## 2021-01-15 LAB — RESP PANEL BY RT-PCR (FLU A&B, COVID) ARPGX2
Influenza A by PCR: NEGATIVE
Influenza B by PCR: NEGATIVE
SARS Coronavirus 2 by RT PCR: NEGATIVE

## 2021-01-15 LAB — CBG MONITORING, ED: Glucose-Capillary: 144 mg/dL — ABNORMAL HIGH (ref 70–99)

## 2021-01-15 LAB — HEMOGLOBIN A1C
Hgb A1c MFr Bld: 5.4 % (ref 4.8–5.6)
Mean Plasma Glucose: 108.28 mg/dL

## 2021-01-15 LAB — ETHANOL: Alcohol, Ethyl (B): <10 mg/dL (ref <10)

## 2021-01-15 LAB — PROCALCITONIN: Procalcitonin: <0.1 ng/mL

## 2021-01-15 LAB — TROPONIN I (HIGH SENSITIVITY)
Troponin I (High Sensitivity): 11 ng/L (ref <18)
Troponin I (High Sensitivity): 104 ng/L (ref <18)

## 2021-01-15 LAB — PHOSPHORUS: Phosphorus: 4 mg/dL (ref 2.5–4.6)

## 2021-01-15 LAB — AMYLASE: Amylase: 59 U/L (ref 28–100)

## 2021-01-15 LAB — APTT: aPTT: 29 seconds (ref 24–36)

## 2021-01-15 LAB — CORTISOL: Cortisol, Plasma: 26.7 ug/dL

## 2021-01-15 LAB — STREP PNEUMONIAE URINARY ANTIGEN: Strep Pneumo Urinary Antigen: NEGATIVE

## 2021-01-15 LAB — HIV ANTIBODY (ROUTINE TESTING W REFLEX): HIV Screen 4th Generation wRfx: NONREACTIVE

## 2021-01-15 MED ORDER — NOREPINEPHRINE 4 MG/250ML-% IV SOLN
5.0000 ug/min | INTRAVENOUS | Status: DC
  Administered 2021-01-15: 12 ug/min via INTRAVENOUS
  Administered 2021-01-15: 8 ug/min via INTRAVENOUS
  Filled 2021-01-15: qty 250

## 2021-01-15 MED ORDER — DOCUSATE SODIUM 50 MG/5ML PO LIQD
100.0000 mg | Freq: Two times a day (BID) | ORAL | Status: DC | PRN
  Filled 2021-01-15 (×2): qty 10

## 2021-01-15 MED ORDER — EPINEPHRINE HCL 5 MG/250ML IV SOLN IN NS
INTRAVENOUS | Status: AC
  Administered 2021-01-15: 10 ug/kg/min
  Filled 2021-01-15: qty 250

## 2021-01-15 MED ORDER — INSULIN ASPART 100 UNIT/ML IJ SOLN
2.0000 [IU] | INTRAMUSCULAR | Status: DC
  Administered 2021-01-15 – 2021-01-19 (×14): 2 [IU] via SUBCUTANEOUS
  Administered 2021-01-19: 4 [IU] via SUBCUTANEOUS
  Administered 2021-01-19 – 2021-01-20 (×2): 2 [IU] via SUBCUTANEOUS
  Administered 2021-01-20: 4 [IU] via SUBCUTANEOUS
  Administered 2021-01-20 – 2021-01-22 (×4): 2 [IU] via SUBCUTANEOUS
  Administered 2021-01-22 (×2): 4 [IU] via SUBCUTANEOUS
  Administered 2021-01-22: 6 [IU] via SUBCUTANEOUS
  Administered 2021-01-22 – 2021-01-23 (×2): 2 [IU] via SUBCUTANEOUS
  Administered 2021-01-23 – 2021-01-25 (×13): 4 [IU] via SUBCUTANEOUS
  Administered 2021-01-26 – 2021-01-28 (×5): 2 [IU] via SUBCUTANEOUS

## 2021-01-15 MED ORDER — CHLORHEXIDINE GLUCONATE CLOTH 2 % EX PADS
6.0000 | MEDICATED_PAD | Freq: Every day | CUTANEOUS | Status: DC
  Administered 2021-01-16 – 2021-01-17 (×3): 6 via TOPICAL

## 2021-01-15 MED ORDER — ACETAMINOPHEN 160 MG/5ML PO SOLN
650.0000 mg | ORAL | Status: DC
Start: 2021-01-15 — End: 2021-01-21
  Administered 2021-01-15 – 2021-01-21 (×30): 650 mg
  Filled 2021-01-15 (×28): qty 20.3

## 2021-01-15 MED ORDER — IOHEXOL 350 MG/ML SOLN
70.0000 mL | Freq: Once | INTRAVENOUS | Status: AC | PRN
  Administered 2021-01-15: 70 mL via INTRAVENOUS

## 2021-01-15 MED ORDER — EPINEPHRINE 1 MG/10ML IJ SOSY
PREFILLED_SYRINGE | INTRAMUSCULAR | Status: AC | PRN
Start: 2021-01-15 — End: 2021-01-15
  Administered 2021-01-15: .1 mg via INTRAVENOUS
  Administered 2021-01-15 (×2): 1 mg via INTRAVENOUS

## 2021-01-15 MED ORDER — PANTOPRAZOLE 2 MG/ML SUSPENSION: 40.0000 mg | Freq: Every day | ORAL | Status: DC

## 2021-01-15 MED ORDER — SODIUM CHLORIDE 0.9 % IV SOLN: INTRAVENOUS | Status: DC

## 2021-01-15 MED ORDER — NOREPINEPHRINE 4 MG/250ML-% IV SOLN
INTRAVENOUS | Status: AC | PRN
  Administered 2021-01-15: 30 ug/min via INTRAVENOUS

## 2021-01-15 MED ORDER — POTASSIUM CHLORIDE 10 MEQ/100ML IV SOLN
10.0000 meq | INTRAVENOUS | Status: AC
  Administered 2021-01-15 (×3): 10 meq via INTRAVENOUS
  Filled 2021-01-15 (×3): qty 100

## 2021-01-15 MED ORDER — ACETAMINOPHEN 325 MG PO TABS: 650.0000 mg | ORAL_TABLET | ORAL | Status: DC

## 2021-01-15 MED ORDER — POLYETHYLENE GLYCOL 3350 17 G PO PACK: 17.0000 g | PACK | Freq: Every day | ORAL | Status: DC | PRN

## 2021-01-15 MED ORDER — NOREPINEPHRINE BITARTRATE 1 MG/ML IV SOLN
INTRAVENOUS | Status: AC | PRN
Start: 2021-01-15 — End: 2021-01-15
  Administered 2021-01-15: 40 mg via INTRAVENOUS
  Administered 2021-01-15: 20 mg via INTRAVENOUS

## 2021-01-15 MED ORDER — SODIUM CHLORIDE 0.9 % IV SOLN
250.0000 mL | INTRAVENOUS | Status: DC
Start: 2021-01-15 — End: 2021-01-28
  Administered 2021-01-23: 250 mL via INTRAVENOUS

## 2021-01-15 MED ORDER — SODIUM CHLORIDE 0.9 % IV SOLN
3.0000 g | Freq: Four times a day (QID) | INTRAVENOUS | Status: DC
  Administered 2021-01-15: 3 g via INTRAVENOUS
  Filled 2021-01-15 (×2): qty 8

## 2021-01-15 MED ORDER — NOREPINEPHRINE 4 MG/250ML-% IV SOLN: 0.0000 ug/min | INTRAVENOUS | Status: DC

## 2021-01-15 MED ORDER — NOREPINEPHRINE 4 MG/250ML-% IV SOLN
INTRAVENOUS | Status: AC
  Filled 2021-01-15: qty 250

## 2021-01-15 MED ORDER — MAGNESIUM SULFATE 50 % IJ SOLN
INTRAMUSCULAR | Status: AC | PRN
  Administered 2021-01-15: 1 g via INTRAVENOUS

## 2021-01-15 MED ORDER — ACETAMINOPHEN 650 MG RE SUPP
650.0000 mg | RECTAL | Status: DC
Start: 2021-01-15 — End: 2021-01-21
  Administered 2021-01-15: 650 mg via RECTAL
  Filled 2021-01-15: qty 1

## 2021-01-15 MED ORDER — POTASSIUM CHLORIDE 20 MEQ PO PACK
20.0000 meq | PACK | Freq: Once | ORAL | Status: AC
  Administered 2021-01-15: 20 meq
  Filled 2021-01-15: qty 1

## 2021-01-15 MED ORDER — PANTOPRAZOLE SODIUM 40 MG IV SOLR
40.0000 mg | Freq: Every day | INTRAVENOUS | Status: DC
Start: 2021-01-15 — End: 2021-01-23
  Administered 2021-01-15 – 2021-01-22 (×8): 40 mg via INTRAVENOUS
  Filled 2021-01-15 (×8): qty 40

## 2021-01-15 MED ORDER — DEXMEDETOMIDINE HCL IN NACL 400 MCG/100ML IV SOLN
0.2000 ug/kg/h | INTRAVENOUS | Status: DC
  Administered 2021-01-15: 0.2 ug/kg/h via INTRAVENOUS
  Administered 2021-01-16: 0.7 ug/kg/h via INTRAVENOUS
  Administered 2021-01-16: 1 ug/kg/h via INTRAVENOUS
  Administered 2021-01-17: 0.7 ug/kg/h via INTRAVENOUS
  Administered 2021-01-17: 0.3 ug/kg/h via INTRAVENOUS
  Administered 2021-01-17: 1 ug/kg/h via INTRAVENOUS
  Administered 2021-01-18: 0.9 ug/kg/h via INTRAVENOUS
  Administered 2021-01-18: 0.8 ug/kg/h via INTRAVENOUS
  Administered 2021-01-18 (×2): 0.7 ug/kg/h via INTRAVENOUS
  Administered 2021-01-19 (×4): 0.8 ug/kg/h via INTRAVENOUS
  Administered 2021-01-20: 0.9 ug/kg/h via INTRAVENOUS
  Administered 2021-01-20: 1.2 ug/kg/h via INTRAVENOUS
  Administered 2021-01-20: 1 ug/kg/h via INTRAVENOUS
  Administered 2021-01-20 – 2021-01-21 (×2): 0.8 ug/kg/h via INTRAVENOUS
  Filled 2021-01-15 (×30): qty 100

## 2021-01-15 MED ORDER — PIPERACILLIN-TAZOBACTAM 3.375 G IVPB
3.3750 g | Freq: Three times a day (TID) | INTRAVENOUS | Status: DC
  Administered 2021-01-15 – 2021-01-16 (×3): 3.375 g via INTRAVENOUS
  Filled 2021-01-15 (×2): qty 50

## 2021-01-15 NOTE — Progress Notes (Signed)
Patient transported to 2H04 from ED without  complications. RN at bedside.

## 2021-01-15 NOTE — Progress Notes (Signed)
Pharmacy Antibiotic Note  Alexis Ball is a 65 y.o. female admitted on 01/15/2021 with aspiration pneumonia.  Pharmacy has been consulted for zosyn dosing.  Received 1 time dose of unasyn. S/p PEA arrest today. WBC WNL, LA 3.5>2.6, temp 36.3. Scr 1.2.  Plan: Zosyn 3.375g IV q8h (4 hour infusion). Monitor renal fx, cx results, clinical pic, LOT     Temp (24hrs), Avg:97.3 F (36.3 C), Min:97.3 F (36.3 C), Max:97.3 F (36.3 C)  Recent Labs  Lab 01/15/21 0605 01/15/21 0619 01/15/21 0812  WBC 7.7  --   --   CREATININE 1.29* 1.20*  --   LATICACIDVEN 3.5*  --  2.6*    CrCl cannot be calculated (Unknown ideal weight.).    Allergies  Allergen Reactions   Iodine Itching    Per pt when put on skin    Antimicrobials this admission: Unasyn 10/6 x1 Zosyn 10/6 >>   Dose adjustments this admission: N/A  Microbiology results: 10/6 BCx: ngtd 10/6 UCx: sent  10/6 Sputum: ordered  10/6 MRSA PCR: ordered  Thank you for allowing pharmacy to be a part of this patient's care.  Antonietta Jewel, PharmD, [place] Clinical Pharmacist  Phone: [phone] 01/15/2021 10:59 AM  Please check AMION for all [place] phone numbers After 10:00 PM, call [place] [phone]

## 2021-01-15 NOTE — Code Documentation (Signed)
Pt comes via [facility] from home, pt was found by neighbor on porch pulseless, with drug paraphernalia, pt was pulseless, CPR started at 504, ROSC at 515 with ST, one epi PTA

## 2021-01-15 NOTE — ED Notes (Signed)
Pt has second cardiac arrest at 632. CPR initiated Epi x1 given. ROSC obtained at 634. Epi drip initiated per verbal order Dr Sedonia Small

## 2021-01-15 NOTE — Progress Notes (Signed)
Bilateral lower extremity venous duplex has been completed. Preliminary results can be found in CV Proc through chart review.   01/15/21 11:45 AM Alexis Ball RVT

## 2021-01-15 NOTE — Progress Notes (Signed)
Patient unavailable for EEG.  She is currently in CT.

## 2021-01-15 NOTE — Progress Notes (Signed)
EEG LTM maintained done. Imped good. No Skin breakdown noted.

## 2021-01-15 NOTE — Procedures (Signed)
Patient Name: Alexis Ball  MRN: [id number]  Epilepsy Attending: Lora Havens  Referring Physician/Provider: Asa Saunas, NP Date:01/15/2021 Duration: 22.24 mins  Patient history: 65 year old female with polysubstance dependence was brought into the emergency department after she was found unresponsive and pulseless by her neighbor, EMS started CPR, ROSC was achieved and patient was transferred to emergency department, she underwent PEA arrest again, ROSC was achieved after 3 minutes of CPR.  EEG to evaluate for seizures.  Level of alertness:comatose  AEDs during EEG study: None  Technical aspects: This EEG study was done with scalp electrodes positioned according to the 10-20 International system of electrode placement. Electrical activity was acquired at a sampling rate of 500Hz  and reviewed with a high frequency filter of 70Hz  and a low frequency filter of 1Hz . EEG data were recorded continuously and digitally stored.   Description: EEG showed continuous generalized background attenuation.  EEG was reactive to tactile stimulation.  Hyperventilation and photic stimulation were not performed.     Of note, this was a technically difficult study due to significant myogenic artifact.  ABNORMALITY -Background attenuation, generalized  IMPRESSION: This technically difficult study is suggestive of severe diffuse encephalopathy, nonspecific to etiology.  No seizures or epileptiform discharges were seen throughout the recording.  [name] Barbra Sarks

## 2021-01-15 NOTE — Progress Notes (Signed)
2D Echocardiogram has been performed.  Alexis Ball 01/15/2021, 1:35 PM

## 2021-01-15 NOTE — ED Notes (Signed)
0653 Levo & Epi gtt start 0707 Levo at 10

## 2021-01-15 NOTE — Progress Notes (Signed)
eLink Physician-Brief Progress Note Patient Name: Alexis Ball DOB: 04-11-1956 MRN: [id number]   Date of Service  01/15/2021  HPI/Events of Note  Agitation - Patient trying to get OOB.  eICU Interventions  Plan: Low dose Precedex IV infusion. Titrate to RASS = 0.      Intervention Category Major Interventions: Delirium, psychosis, severe agitation - evaluation and management  [name] 01/15/2021, 7:43 PM

## 2021-01-15 NOTE — Progress Notes (Signed)
EEG complete - results pending 

## 2021-01-15 NOTE — ED Provider Notes (Addendum)
[facility] Emergency Department Provider Note MRN:  [id number]  Arrival date & time: 01/15/21     Chief Complaint   Cardiac Arrest   History of Present Illness   Alexis Ball is a 65 y.o. year-old female with a history of DVT, CVA presenting to the ED with chief complaint of cardiac arrest.  Found unresponsive on her front porch, not breathing, pulseless, brought here by EMS.  I was unable to obtain an accurate HPI, PMH, or ROS due to the patient's cardiac arrest.  Level 5 caveat.  Review of Systems  Positive for cardiac arrest.  Patient's Health History    Past Medical History:  Diagnosis Date   Allergic rhinitis    Ambulates with cane    Full dentures    GERD (gastroesophageal reflux disease)    occasional , takes tums   Glaucoma, both eyes    Heart murmur    evalulated by cardiologist--- dr h. Johney Frame 01-07-2020 note in epic, echo done 01-31-2020 normal w/ no valve abnormalities   History of cellulitis    2006  LLE   History of chest pain    had cardiac cath 10-16-2004 (in epic)  showed normal coronaries that are tortous, patent renal arteries, normal LVF with ef 50-55%, noncardiac chest pain;  pt had ED visit 12-07-2019 negative work-up referred to cardiology, was seen by dr Johney Frame 01-07-2020 atypical cp / non-cardiac echo ordered / no further work-up   History of CVA (cerebrovascular accident) without residual deficits    11-29-2019  per pt happened in 2007 and has not had cva/ tia sympomts since   History of DVT of lower extremity    06-26-2020  per pt many years ago had RLE DVT completed blood thinner treatement stated not other blood clot until 07/ 2021 RUE with superficial venous thrombosis assosiated with PICC line which was removed   History of osteomyelitis 10/2019   left foot  post op bunionectomy 06/ 2021   Hypertension    followed by pcp   IDA (iron deficiency anemia)    Migraines    11-29-2019 per pt previously had  botox injection for prevention, last had 3 yrs ago,  now uses breathing , meditation, and minimizes triggers (bright light/ loud noise)   Neuropathy    feet   OA (osteoarthritis)    Vascular dementia ([place])    per pcp h&p 06-18-2020 with mild cognitive impairment take, namenda with improvement   Vitamin D deficiency    Wears glasses     Past Surgical History:  Procedure Laterality Date   BONE BIOPSY Left 10/14/2019   Procedure: BONE BIOPSY PROXIMAL PHALANX;  Surgeon: Evelina Bucy, DPM;  Location: [place];  Service: Podiatry;  Laterality: Left;   BONE BIOPSY Left 04-30-2020  _0    x2  on left foot   BONE BIOPSY Left 07/02/2020   Procedure: BONE BIOPSY - FROZEN SECTION;  Surgeon: Evelina Bucy, DPM;  Location: [place];  Service: Podiatry;  Laterality: Left;   BUNIONECTOMY Left 10-05-2019  dr Milinda Pointer _1    great toe   CARDIAC CATHETERIZATION  10-16-2004  dr Einar Gip   normal coronaries (tortuous), normal LVF   CATARACT EXTRACTION W/ INTRAOCULAR LENS  IMPLANT, BILATERAL  2013   COLONOSCOPY  last one 04-23-2009  dr d. Sharlett Iles   FOOT SURGERY Right unsure date   bunionectomy and hammertoe   HARDWARE REMOVAL Right 06/ 2015  _2    right foot s/p bunionectomy/ hammertoe conrrection  HARDWARE REMOVAL Left 12/05/2019   Procedure: HARDWARE REMOVAL ,WOUND CLOSURE OF FIRST TOE;  Surgeon: Evelina Bucy, DPM;  Location: [place];  Service: Podiatry;  Laterality: Left;   IMPLANT OF A SILASTIC METATARSAL PHALANGE JOINT Left 07/02/2020   Procedure: IMPLANT OF A SILASTIC METATARSAL PHALANGE JOINT WITH REMOVAL OF ANTIBIOTIC SPACER;CAPSULAR REPAIR WITH TISSUE SUBSTITUTE;  Surgeon: Evelina Bucy, DPM;  Location: [place];  Service: Podiatry;  Laterality: Left;   IRRIGATION AND DEBRIDEMENT FOOT Left 10/14/2019   Procedure: INCISION AND DRAINAGE FOR LEFT FOOT INFECTION, COMPLEX;  Surgeon: Evelina Bucy, DPM;  Location: [place];  Service: Podiatry;   Laterality: Left;   IRRIGATION AND DEBRIDEMENT FOOT Left 10/17/2019   Procedure: Debridement and Irrigation of Left Foot; Removal of Silicone Implant; Insertion of Antibiotic Spacer; Application of External Fixator; Possible Application of Wound VAC;  Surgeon: Evelina Bucy, DPM;  Location: [place];  Service: Podiatry;  Laterality: Left;  Pre-op Block POP/SAPH; MAC   KNEE ARTHROSCOPY Left 2018   ORIF CALCANEOUS FRACTURE Left 10/17/2019   Procedure: Application External Fixation;  Surgeon: Evelina Bucy, DPM;  Location: [place];  Service: Podiatry;  Laterality: Left;    Family History  Problem Relation Age of Onset   Hypertension Mother     Social History   Socioeconomic History   Marital status: Widowed    Spouse name: Not on file   Number of children: Not on file   Years of education: Not on file   Highest education level: Not on file  Occupational History   Not on file  Tobacco Use   Smoking status: Never   Smokeless tobacco: Never  Vaping Use   Vaping Use: Never used  Substance and Sexual Activity   Alcohol use: No   Drug use: No   Sexual activity: Not on file  Other Topics Concern   Not on file  Social History Narrative   Not on file   Social Determinants of Health   Financial Resource Strain: Not on file  Food Insecurity: Not on file  Transportation Needs: Not on file  Physical Activity: Not on file  Stress: Not on file  Social Connections: Not on file  Intimate Partner Violence: Not on file     Physical Exam   Vitals:   01/15/21 0603 01/15/21 0606  BP:  (!) 161/106  Pulse: 66   Resp: 18 (!) 21  SpO2: 99%     CONSTITUTIONAL: Ill-appearing NEURO: Unresponsive EYES: Pupils 3 mm, minimally reactive ENT/NECK:  no LAD, no JVD CARDIO: Regular rate, poorly perfused PULM: Scattered rhonchi, King airway in place GI/GU:  normal bowel sounds, non-distended, non-tender MSK/SPINE:  No gross deformities, no edema SKIN:  no rash, atraumatic, skin is cold PSYCH:  Unable to assess  *Additional and/or pertinent findings included in MDM below  Diagnostic and Interventional Summary    EKG Interpretation  Date/Time:  Thursday January 15 2021 05:59:14 EDT Ventricular Rate:  65 PR Interval:  150 QRS Duration: 78 QT Interval:  336 QTC Calculation: 350 R Axis:   39 Text Interpretation: Sinus rhythm Probable left atrial enlargement Anteroseptal infarct, old Repol abnrm suggests ischemia, anterolateral Confirmed by Gerlene Fee [phone]) on 01/15/2021 6:17:54 AM       Labs Reviewed  I-STAT CHEM 8, ED - Abnormal; Notable for the following components:      Result Value   Creatinine, Ser 1.20 (*)    Glucose, Bld 209 (*)    Calcium, Ion  1.03 (*)    Hemoglobin 10.9 (*)    HCT 32.0 (*)    All other components within normal limits  CBC  LACTIC ACID, PLASMA  LACTIC ACID, PLASMA  RAPID URINE DRUG SCREEN, HOSP PERFORMED  URINALYSIS, ROUTINE W REFLEX MICROSCOPIC  BLOOD GAS, ARTERIAL  PROTIME-INR  COMPREHENSIVE METABOLIC PANEL  TROPONIN I (HIGH SENSITIVITY)    DG Chest Portable 1 View    (Results Pending)  CT HEAD WO CONTRAST (5MM)    (Results Pending)  CT Cervical Spine Wo Contrast    (Results Pending)  CT Angio Chest/Abd/Pel for Dissection W and/or Wo Contrast    (Results Pending)    Medications  EPINEPHrine (ADRENALIN) 1 MG/10ML injection (1 mg Intravenous Given 01/15/21 0553)  norepinephrine (LEVOPHED) 4-5 MG/250ML-% infusion SOLN (0 mcg/kg/min  Paused 01/15/21 0603)  EPINEPHrine NaCl 5-0.9 MG/250ML-% premix infusion (has no administration in time range)  norepinephrine (LEVOPHED) injection (40 mg Intravenous Given 01/15/21 0554)  norepinephrine (LEVOPHED) 65m in 2567mpremix infusion ( Intravenous Canceled Entry 01/15/21 0600)     Procedures  /  Critical Care .Critical Care Performed by: BeMaudie FlakesMD Authorized by: BeMaudie FlakesMD   Critical care provider statement:    Critical care time (minutes):  45   Critical care was  necessary to treat or prevent imminent or life-threatening deterioration of the following conditions: Cardiac arrest.   Critical care was time spent personally by me on the following activities:  Discussions with consultants, evaluation of patient's response to treatment, examination of patient, ordering and performing treatments and interventions, ordering and review of laboratory studies, ordering and review of radiographic studies, pulse oximetry, re-evaluation of patient's condition, obtaining history from patient or surrogate and review of old charts Procedure Name: Intubation Date/Time: 01/15/2021 6:18 AM Performed by: BeMaudie FlakesMD Pre-anesthesia Checklist: Patient identified, Patient being monitored, Emergency Drugs available, Timeout performed and Suction available Oxygen Delivery Method: Non-rebreather mask Preoxygenation: Pre-oxygenation with 100% oxygen Induction Type: Rapid sequence Ventilation: Mask ventilation without difficulty Laryngoscope Size: Glidescope and 3 Grade View: Grade I Tube size: 7.5 mm Number of attempts: 1 Airway Equipment and Method: Rigid stylet Placement Confirmation: ETT inserted through vocal cords under direct vision, CO2 detector and Breath sounds checked- equal and bilateral Secured at: 24 cm Tube secured with: ETT holder Comments: Intubated without RSI medications.    .Central Line  Date/Time: 01/15/2021 6:19 AM Performed by: BeMaudie FlakesMD Authorized by: BeMaudie FlakesMD   Consent:    Consent obtained:  Emergent situation Universal protocol:    Patient identity confirmed:  Arm band Pre-procedure details:    Indication(s): central venous access   Sedation:    Sedation type:  None Anesthesia:    Anesthesia method:  None Procedure details:    Location:  R femoral   Patient position:  Supine   Procedural supplies:  Triple lumen   Catheter size:  7 Fr   Landmarks identified: yes     Ultrasound guidance: yes     Ultrasound  guidance timing: real time     Number of attempts:  1   Successful placement: yes   Post-procedure details:    Post-procedure:  Dressing applied and line sutured   Assessment:  Blood return through all ports and free fluid flow   Procedure completion:  Tolerated well, no immediate complications Comments:     Crash central line obtained in the setting of profound hypotension and recent cardiac arrest.  Aseptic technique was used but  not fully sterile.  Recommend removal or replacement in 24 to 48 hours. CPR  Date/Time: 01/15/2021 6:26 AM Performed by: Maudie Flakes, MD Authorized by: Maudie Flakes, MD  CPR Procedure Details:      Amount of time prior to administration of ACLS/BLS (minutes):  4   ACLS/BLS initiated by EMS: Yes     CPR/ACLS performed in the ED: Yes     Duration of CPR (minutes):  15   Outcome: ROSC obtained    CPR performed via ACLS guidelines under my direct supervision.  See RN documentation for details including defibrillator use, medications, doses and timing.  ED Course and Medical Decision Making  I have reviewed the triage vital signs, the nursing notes, and pertinent available records from the EMR.  Listed above are laboratory and imaging tests that I personally ordered, reviewed, and interpreted and then considered in my medical decision making (see below for details).  Cardiac arrest.  Found on her front porch warm but pulseless and not breathing.  11 minutes of CPR, epinephrine given x1 with return of spontaneous circulation.  No shocks required.  King airway in place on arrival.  Patient is cold and poorly perfused.  Difficulty obtaining accurate blood pressure and unable to palpate pulse.  CPR restarted.  Given 2 more milligrams epinephrine, started on norepinephrine drip, intubated, followed by return of spontaneous circulation.  Central line placed as described above.  Patient requiring minimal pressors after this event, awaiting labs, CT imaging.  Will  admit to intensivist service.    6:40 AM update: Called to bedside for question of loss of pulses.  Unable to palpate femoral, CPR restarted for 2 minutes.  Given additional 1 mg epinephrine.  Epi drip started at 10.  Bedside ultrasound showing minimal blood present cardiac activity, no effusion.  ROSC obtained.  7 AM update: Patient with repeated episodes of bradycardia as well, atropine trialed with improvement.  Seemed to have prolonged QT, given 1 g magnesium.   Barth Kirks. [place]onia Small, [place] [email or web address]  Final Clinical Impressions(s) / ED Diagnoses     ICD-10-CM   1. Cardiac arrest [facility])  I46.9       ED Discharge Orders     None        Discharge Instructions Discussed with and Provided to Patient:   Discharge Instructions   None       Maudie Flakes, MD 01/15/21 [id number]    Maudie Flakes, MD 01/15/21 [phone]

## 2021-01-15 NOTE — H&P (Addendum)
NAME:  Alexis Ball, MRN:  [id number], DOB:  31-May-1955, LOS: 0 ADMISSION DATE:  01/15/2021, CONSULTATION DATE: 01/15/2021 REFERRING MD: Emergency department physician, CHIEF COMPLAINT: PEA arrest  History of Present Illness:  65 year old with extensive past medical history as noted below who was found on her porch by a neighbor early a.m. 01/15/2021 unresponsive EMS arrived to find PEA arrest she required intubation multiple rounds of epinephrine chest compressions and coded again in the emergency department x2.  Pulmonary critical care asked to admit note she had drug paraphernalia around her this is presumed drug overdose she does have a history of DVT therefore we will work-up for possible DVT and PE.  Pertinent  Medical History   Past Medical History:  Diagnosis Date   Allergic rhinitis    Ambulates with cane    Full dentures    GERD (gastroesophageal reflux disease)    occasional , takes tums   Glaucoma, both eyes    Heart murmur    evalulated by cardiologist--- dr h. Johney Frame 01-07-2020 note in epic, echo done 01-31-2020 normal w/ no valve abnormalities   History of cellulitis    2006  LLE   History of chest pain    had cardiac cath 10-16-2004 (in epic)  showed normal coronaries that are tortous, patent renal arteries, normal LVF with ef 50-55%, noncardiac chest pain;  pt had ED visit 12-07-2019 negative work-up referred to cardiology, was seen by dr Johney Frame 01-07-2020 atypical cp / non-cardiac echo ordered / no further work-up   History of CVA (cerebrovascular accident) without residual deficits    11-29-2019  per pt happened in 2007 and has not had cva/ tia sympomts since   History of DVT of lower extremity    06-26-2020  per pt many years ago had RLE DVT completed blood thinner treatement stated not other blood clot until 07/ 2021 RUE with superficial venous thrombosis assosiated with PICC line which was removed   History of osteomyelitis 10/2019   left foot  post op  bunionectomy 06/ 2021   Hypertension    followed by pcp   IDA (iron deficiency anemia)    Migraines    11-29-2019 per pt previously had botox injection for prevention, last had 3 yrs ago,  now uses breathing , meditation, and minimizes triggers (bright light/ loud noise)   Neuropathy    feet   OA (osteoarthritis)    Prediabetes    Vascular dementia ([place])    per pcp h&p 06-18-2020 with mild cognitive impairment take, namenda with improvement   Vitamin D deficiency    Wears glasses      Significant Hospital Events: Including procedures, antibiotic start and stop dates in addition to other pertinent events   01/15/2021 found down in PEA multiple rounds of epinephrine Levophed chest compressions 01/15/2021 CT of the head ordered 01/15/2021 lower extremity Doppler studies  Interim History / Subjective:  Status post multiple codes currently on Levophed at 15 mics nonresponsive to be admitted for further evaluation and treatment intensive care unit  Objective   Blood pressure (!) 142/104, pulse 90, resp. rate 20, SpO2 92 %.    Vent Mode: PRVC FiO2 (%):  [100 %] 100 % Set Rate:  [18 bmp-20 bmp] 20 bmp Vt Set:  [400 mL-500 mL] 400 mL PEEP:  [5 cmH20] 5 cmH20 Plateau Pressure:  [19 cmH20-23 cmH20] 19 cmH20  No intake or output data in the 24 hours ending 01/15/21 0744 There were no vitals filed for this visit.  Examination: General: Morbid obese female who is unresponsive HENT: Pupils are pinpoint but reactive no JVD or lymphadenopathy is appreciated Lungs: Diminished throughout Cardiovascular: Heart sounds regular rate and rhythm Abdomen: Soft nontender no bowel sounds Extremities: Positive for edema warm  Neuro: does not respond to verbal stimuli.  Pupils are pinpoint but reactive GU: Foley to be placed  Resolved Hospital Problem list     Assessment & Plan:  Altered mental status in the setting of cardiac arrest with PEA with unknown downtime with multiple code with no  sedation had been given. CT scan of the head Possible neurology consult Hold all sedation Admit to the intensive care unit Normothermia protocol   Cardiac arrest with PEA with multiple interventions including epinephrine Levophed chest compressions.  Remains vasopressor dependent Vasopressors are needed to maintain adequate blood pressure Will need an arterial line in the near future Admit to intensive care unit Monitor and replete all electrolytes potassium given for K of less than 3.5 Twelve-lead EKG Monitor lactic acid and pH Drug screen for possible overdose was found with drug paraphernalia on person Lower extremity Doppler studies No heparin until CT of the head has been evaluated DM CBG (last 3)  No results for input(s): GLUCAP in the last 72 hours.  Best Practice (right click and "Reselect all SmartList Selections" daily)   Diet/type: NPO DVT prophylaxis: not indicated GI prophylaxis: N/A Lines: Central line Foley:  Yes, and it is still needed Code Status:  full code Last date of multidisciplinary goals of care discussion [TBD] Daughter Charna Archer updated ar 0800 and she is in route to [facility] ER Labs   CBC: Recent Labs  Lab 01/15/21 0605 01/15/21 0619 01/15/21 0633  WBC 7.7  --   --   HGB 11.2* 10.9* 11.2*  HCT 34.9* 32.0* 33.0*  MCV 94.3  --   --   PLT 248  --   --     Basic Metabolic Panel: Recent Labs  Lab 01/15/21 0605 01/15/21 0619 01/15/21 0633  NA 137 139 140  K 4.0 4.2 3.1*  CL 104 107  --   CO2 21*  --   --   GLUCOSE 214* 209*  --   BUN 14 15  --   CREATININE 1.29* 1.20*  --   CALCIUM 8.6*  --   --    GFR: CrCl cannot be calculated (Unknown ideal weight.). Recent Labs  Lab 01/15/21 0605  WBC 7.7  LATICACIDVEN 3.5*    Liver Function Tests: Recent Labs  Lab 01/15/21 0605  AST 358*  ALT 434*  ALKPHOS 62  BILITOT PENDING  PROT 6.7  ALBUMIN 3.4*   No results for input(s): LIPASE, AMYLASE in the last 168 hours. No  results for input(s): AMMONIA in the last 168 hours.  ABG    Component Value Date/Time   PHART 7.396 01/15/2021 0633   PCO2ART 29.7 (L) 01/15/2021 0633   PO2ART 80 (L) 01/15/2021 0633   HCO3 18.5 (L) 01/15/2021 0633   TCO2 19 (L) 01/15/2021 0633   ACIDBASEDEF 6.0 (H) 01/15/2021 0633   O2SAT 96.0 01/15/2021 0633     Coagulation Profile: Recent Labs  Lab 01/15/21 0605  INR 1.0    Cardiac Enzymes: No results for input(s): CKTOTAL, CKMB, CKMBINDEX, TROPONINI in the last 168 hours.  HbA1C: Hgb A1c MFr Bld  Date/Time Value Ref Range Status  01/31/2020 09:24 AM 5.7 (H) 4.8 - 5.6 % Final    Comment:  Prediabetes: 5.7 - 6.4          Diabetes: >6.4          Glycemic control for adults with diabetes: <7.0     CBG: No results for input(s): GLUCAP in the last 168 hours.  Review of Systems:   na  Past Medical History:  She,  has a past medical history of Allergic rhinitis, Ambulates with cane, Full dentures, GERD (gastroesophageal reflux disease), Glaucoma, both eyes, Heart murmur, History of cellulitis, History of chest pain, History of CVA (cerebrovascular accident) without residual deficits, History of DVT of lower extremity, History of osteomyelitis (10/2019), Hypertension, IDA (iron deficiency anemia), Migraines, Neuropathy, OA (osteoarthritis), Prediabetes, Vascular dementia ([place]), Vitamin D deficiency, and Wears glasses.   Surgical History:   Past Surgical History:  Procedure Laterality Date   BONE BIOPSY Left 10/14/2019   Procedure: BONE BIOPSY PROXIMAL PHALANX;  Surgeon: Evelina Bucy, DPM;  Location: [place];  Service: Podiatry;  Laterality: Left;   BONE BIOPSY Left 04-30-2020  _0    x2  on left foot   BONE BIOPSY Left 07/02/2020   Procedure: BONE BIOPSY - FROZEN SECTION;  Surgeon: Evelina Bucy, DPM;  Location: [place];  Service: Podiatry;  Laterality: Left;   BUNIONECTOMY Left 10-05-2019  dr Milinda Pointer _1    great toe   CARDIAC  CATHETERIZATION  10-16-2004  dr Einar Gip   normal coronaries (tortuous), normal LVF   CATARACT EXTRACTION W/ INTRAOCULAR LENS  IMPLANT, BILATERAL  2013   COLONOSCOPY  last one 04-23-2009  dr d. Sharlett Iles   FOOT SURGERY Right unsure date   bunionectomy and hammertoe   HARDWARE REMOVAL Right 06/ 2015  _2    right foot s/p bunionectomy/ hammertoe conrrection   HARDWARE REMOVAL Left 12/05/2019   Procedure: HARDWARE REMOVAL ,WOUND CLOSURE OF FIRST TOE;  Surgeon: Evelina Bucy, DPM;  Location: [place];  Service: Podiatry;  Laterality: Left;   IMPLANT OF A SILASTIC METATARSAL PHALANGE JOINT Left 07/02/2020   Procedure: IMPLANT OF A SILASTIC METATARSAL PHALANGE JOINT WITH REMOVAL OF ANTIBIOTIC SPACER;CAPSULAR REPAIR WITH TISSUE SUBSTITUTE;  Surgeon: Evelina Bucy, DPM;  Location: [place];  Service: Podiatry;  Laterality: Left;   IRRIGATION AND DEBRIDEMENT FOOT Left 10/14/2019   Procedure: INCISION AND DRAINAGE FOR LEFT FOOT INFECTION, COMPLEX;  Surgeon: Evelina Bucy, DPM;  Location: [place];  Service: Podiatry;  Laterality: Left;   IRRIGATION AND DEBRIDEMENT FOOT Left 10/17/2019   Procedure: Debridement and Irrigation of Left Foot; Removal of Silicone Implant; Insertion of Antibiotic Spacer; Application of External Fixator; Possible Application of Wound VAC;  Surgeon: Evelina Bucy, DPM;  Location: [place];  Service: Podiatry;  Laterality: Left;  Pre-op Block POP/SAPH; MAC   KNEE ARTHROSCOPY Left 2018   ORIF CALCANEOUS FRACTURE Left 10/17/2019   Procedure: Application External Fixation;  Surgeon: Evelina Bucy, DPM;  Location: [place];  Service: Podiatry;  Laterality: Left;     Social History:   reports that she has never smoked. She has never used smokeless tobacco. She reports that she does not drink alcohol and does not use drugs.   Family History:  Her family history includes Hypertension in her mother.   Allergies Allergies  Allergen Reactions   Iodine  Itching    Per pt when put on skin     Home Medications  Prior to Admission medications   Medication Sig Start Date End Date Taking? Authorizing Provider  amLODipine (NORVASC) 5 MG tablet Take  1 tablet (5 mg total) by mouth daily. Patient taking differently: Take 5 mg by mouth at bedtime. 01/07/20 04/06/20  Freada Bergeron, MD  azelastine (ASTELIN) 0.1 % nasal spray SPRAY 1 SPRAY IN EACH NOSTRIL BY INTRANASAL ROUTE 2 TIMES PER DAY FOR 30 DAYS 06/16/20   [provider]  bimatoprost (LUMIGAN) 0.01 % SOLN Place 1 drop into both eyes at bedtime.     [provider]  brimonidine (ALPHAGAN) 0.15 % ophthalmic solution Place 1 drop into both eyes 2 (two) times daily.     [provider]  calcium carbonate (TUMS - DOSED IN MG ELEMENTAL CALCIUM) 500 MG chewable tablet Chew 1 tablet by mouth as needed for indigestion or heartburn.    [provider]  cephALEXin (KEFLEX) 500 MG capsule Take 1 capsule (500 mg total) by mouth 2 (two) times daily. 07/02/20   Evelina Bucy, DPM  cetirizine (ZYRTEC) 10 MG tablet Take 10 mg by mouth daily.    [provider]  cyclobenzaprine (FLEXERIL) 10 MG tablet Take 1 tablet (10 mg total) by mouth 2 (two) times daily as needed for muscle spasms. 12/17/20   Curatolo, [name], DO  dorzolamide-timolol (COSOPT) 22.3-6.8 MG/ML ophthalmic solution Place 1 drop into both eyes 2 (two) times daily.  05/14/13   [provider]  ferrous sulfate 325 (65 FE) MG tablet Take 325 mg by mouth daily with breakfast.    [provider]  gabapentin (NEURONTIN) 300 MG capsule Take 1 capsule (300 mg total) by mouth 3 (three) times daily. One in the morning and two at bedtime. Patient taking differently: Take 300 mg by mouth 2 (two) times daily. One in the morning and two at bedtime. 01/22/20   Evelina Bucy, DPM  hydrochlorothiazide (HYDRODIURIL) 25 MG tablet Take 25 mg by mouth daily.    [provider]  ibuprofen (ADVIL) 600  MG tablet Take 1 tablet (600 mg total) by mouth every 8 (eight) hours as needed. 11/18/20   Evelina Bucy, DPM  memantine (NAMENDA) 10 MG tablet Take 10 mg by mouth 2 (two) times daily. 03/17/20   [provider]  Multiple Vitamin (MULTIVITAMIN) capsule Take 1 capsule by mouth daily.    [provider]  NARCAN 4 MG/0.1ML LIQD nasal spray kit SMARTSIG:1 Spray(s) Both Nares 12/28/19   [provider]  omega-3 acid ethyl esters (LOVAZA) 1 g capsule Take 1 g by mouth daily. 11/29/19   [provider]  Omega-3 Fatty Acids (FISH OIL) 1000 MG CAPS Take 1,000 mg by mouth daily.    [provider]  ondansetron (ZOFRAN) 4 MG tablet Take 1 tablet (4 mg total) by mouth every 8 (eight) hours as needed for nausea or vomiting. 07/02/20   Evelina Bucy, DPM  oxyCODONE-acetaminophen (PERCOCET) 5-325 MG tablet Take 1 tablet by mouth every 4 (four) hours as needed for severe pain. 12/30/20   Evelina Bucy, DPM  Polyvinyl Alcohol-Povidone (REFRESH OP) Place 1 drop into both eyes 3 (three) times daily as needed (dry eyes).     [provider]  potassium chloride SA (K-DUR,KLOR-CON) 20 MEQ tablet Take 20 mEq by mouth daily.    [provider]  rosuvastatin (CRESTOR) 10 MG tablet Take 1 tablet (10 mg total) by mouth daily. Patient taking differently: Take 10 mg by mouth daily. 01/07/20 04/06/20  Freada Bergeron, MD  silver sulfADIAZINE (SILVADENE) 1 % cream Apply pea-sized amount to wound daily. 12/25/19   Evelina Bucy, DPM  Critical care time: 2 min     Richardson Landry Leeam Cedrone ACNP Acute Care Nurse Practitioner [place] Please consult Amion 01/15/2021, 7:44 AM

## 2021-01-15 NOTE — Progress Notes (Signed)
Patient is unavailable for EEG.  RN asked that we check with her in one hour as to availability.

## 2021-01-15 NOTE — Progress Notes (Signed)
Started cEEG study.  Notified [facility] monitoring.  Tested patient event button. 

## 2021-01-16 ENCOUNTER — Inpatient Hospital Stay (HOSPITAL_COMMUNITY): Payer: Medicare Other

## 2021-01-16 DIAGNOSIS — E876 Hypokalemia: Secondary | ICD-10-CM | POA: Diagnosis not present

## 2021-01-16 DIAGNOSIS — R410 Disorientation, unspecified: Secondary | ICD-10-CM | POA: Diagnosis not present

## 2021-01-16 DIAGNOSIS — G9341 Metabolic encephalopathy: Secondary | ICD-10-CM | POA: Diagnosis not present

## 2021-01-16 DIAGNOSIS — J9601 Acute respiratory failure with hypoxia: Secondary | ICD-10-CM | POA: Diagnosis not present

## 2021-01-16 DIAGNOSIS — I469 Cardiac arrest, cause unspecified: Secondary | ICD-10-CM | POA: Diagnosis not present

## 2021-01-16 LAB — POCT I-STAT 7, (LYTES, BLD GAS, ICA,H+H)
Acid-base deficit: 2 mmol/L (ref 0.0–2.0)
Bicarbonate: 20.9 mmol/L (ref 20.0–28.0)
Calcium, Ion: 1.16 mmol/L (ref 1.15–1.40)
HCT: 38 % (ref 36.0–46.0)
Hemoglobin: 12.9 g/dL (ref 12.0–15.0)
O2 Saturation: 96 %
Patient temperature: 99.8
Potassium: 2.8 mmol/L — ABNORMAL LOW (ref 3.5–5.1)
Sodium: 138 mmol/L (ref 135–145)
TCO2: 22 mmol/L (ref 22–32)
pCO2 arterial: 31.1 mmHg — ABNORMAL LOW (ref 32.0–48.0)
pH, Arterial: 7.437 (ref 7.350–7.450)
pO2, Arterial: 82 mmHg — ABNORMAL LOW (ref 83.0–108.0)

## 2021-01-16 LAB — URINE CULTURE: Culture: NO GROWTH

## 2021-01-16 LAB — BASIC METABOLIC PANEL
Anion gap: 13 (ref 5–15)
Anion gap: 5 (ref 5–15)
BUN: 11 mg/dL (ref 8–23)
BUN: 11 mg/dL (ref 8–23)
CO2: 20 mmol/L — ABNORMAL LOW (ref 22–32)
CO2: 23 mmol/L (ref 22–32)
Calcium: 8.5 mg/dL — ABNORMAL LOW (ref 8.9–10.3)
Calcium: 8.7 mg/dL — ABNORMAL LOW (ref 8.9–10.3)
Chloride: 104 mmol/L (ref 98–111)
Chloride: 108 mmol/L (ref 98–111)
Creatinine, Ser: 1.05 mg/dL — ABNORMAL HIGH (ref 0.44–1.00)
Creatinine, Ser: 0.84 mg/dL (ref 0.44–1.00)
GFR, Estimated: 60 mL/min — ABNORMAL LOW (ref >60)
GFR, Estimated: >60 mL/min (ref >60)
Glucose, Bld: 143 mg/dL — ABNORMAL HIGH (ref 70–99)
Glucose, Bld: 133 mg/dL — ABNORMAL HIGH (ref 70–99)
Potassium: 2.8 mmol/L — ABNORMAL LOW (ref 3.5–5.1)
Potassium: 4.9 mmol/L (ref 3.5–5.1)
Sodium: 137 mmol/L (ref 135–145)
Sodium: 136 mmol/L (ref 135–145)

## 2021-01-16 LAB — CBC
HCT: 37 % (ref 36.0–46.0)
Hemoglobin: 12.2 g/dL (ref 12.0–15.0)
MCH: 30 pg (ref 26.0–34.0)
MCHC: 33 g/dL (ref 30.0–36.0)
MCV: 90.9 fL (ref 80.0–100.0)
Platelets: 213 10*3/uL (ref 150–400)
RBC: 4.07 MIL/uL (ref 3.87–5.11)
RDW: 12.7 % (ref 11.5–15.5)
WBC: 10 10*3/uL (ref 4.0–10.5)
nRBC: 0 % (ref 0.0–0.2)

## 2021-01-16 LAB — MAGNESIUM
Magnesium: 1.7 mg/dL (ref 1.7–2.4)
Magnesium: 2.2 mg/dL (ref 1.7–2.4)

## 2021-01-16 LAB — GLUCOSE, CAPILLARY
Glucose-Capillary: 108 mg/dL — ABNORMAL HIGH (ref 70–99)
Glucose-Capillary: 118 mg/dL — ABNORMAL HIGH (ref 70–99)
Glucose-Capillary: 122 mg/dL — ABNORMAL HIGH (ref 70–99)
Glucose-Capillary: 125 mg/dL — ABNORMAL HIGH (ref 70–99)
Glucose-Capillary: 137 mg/dL — ABNORMAL HIGH (ref 70–99)
Glucose-Capillary: 141 mg/dL — ABNORMAL HIGH (ref 70–99)
Glucose-Capillary: 78 mg/dL (ref 70–99)

## 2021-01-16 LAB — HEPATIC FUNCTION PANEL
ALT: 513 U/L — ABNORMAL HIGH (ref 0–44)
AST: 396 U/L — ABNORMAL HIGH (ref 15–41)
Albumin: 3.3 g/dL — ABNORMAL LOW (ref 3.5–5.0)
Alkaline Phosphatase: 61 U/L (ref 38–126)
Bilirubin, Direct: 0.2 mg/dL (ref 0.0–0.2)
Indirect Bilirubin: 1.4 mg/dL — ABNORMAL HIGH (ref 0.3–0.9)
Total Bilirubin: 1.6 mg/dL — ABNORMAL HIGH (ref 0.3–1.2)
Total Protein: 6.6 g/dL (ref 6.5–8.1)

## 2021-01-16 LAB — PHOSPHORUS
Phosphorus: 2.9 mg/dL (ref 2.5–4.6)
Phosphorus: 1.6 mg/dL — ABNORMAL LOW (ref 2.5–4.6)

## 2021-01-16 MED ORDER — POTASSIUM CHLORIDE 10 MEQ/50ML IV SOLN
10.0000 meq | INTRAVENOUS | Status: AC
  Administered 2021-01-16 (×6): 10 meq via INTRAVENOUS
  Filled 2021-01-16 (×4): qty 50

## 2021-01-16 MED ORDER — MIDAZOLAM HCL 2 MG/2ML IJ SOLN
2.0000 mg | INTRAMUSCULAR | Status: DC | PRN
  Administered 2021-01-17 (×2): 2 mg via INTRAVENOUS
  Filled 2021-01-16 (×2): qty 2

## 2021-01-16 MED ORDER — MIDAZOLAM HCL 2 MG/2ML IJ SOLN
INTRAMUSCULAR | Status: AC
  Administered 2021-01-16: 2 mg via INTRAVENOUS
  Filled 2021-01-16: qty 2

## 2021-01-16 MED ORDER — POTASSIUM CHLORIDE 10 MEQ/50ML IV SOLN
10.0000 meq | INTRAVENOUS | Status: DC
  Administered 2021-01-16 (×3): 10 meq via INTRAVENOUS
  Filled 2021-01-16 (×3): qty 50

## 2021-01-16 MED ORDER — MIDAZOLAM HCL 2 MG/2ML IJ SOLN: 2.0000 mg | Freq: Once | INTRAMUSCULAR | Status: AC

## 2021-01-16 MED ORDER — MAGNESIUM SULFATE 2 GM/50ML IV SOLN
2.0000 g | Freq: Once | INTRAVENOUS | Status: AC
  Administered 2021-01-16: 2 g via INTRAVENOUS
  Filled 2021-01-16: qty 50

## 2021-01-16 MED ORDER — POTASSIUM CHLORIDE 20 MEQ PO PACK
40.0000 meq | PACK | Freq: Once | ORAL | Status: AC
  Administered 2021-01-16: 40 meq
  Filled 2021-01-16: qty 2

## 2021-01-16 MED ORDER — PROSOURCE TF PO LIQD
45.0000 mL | Freq: Two times a day (BID) | ORAL | Status: DC
  Filled 2021-01-16: qty 45

## 2021-01-16 MED ORDER — SODIUM CHLORIDE 0.9 % IV SOLN
3.0000 g | Freq: Four times a day (QID) | INTRAVENOUS | Status: DC
  Administered 2021-01-16 – 2021-01-19 (×12): 3 g via INTRAVENOUS
  Filled 2021-01-16 (×13): qty 8

## 2021-01-16 MED ORDER — VITAL HIGH PROTEIN PO LIQD: 1000.0000 mL | ORAL | Status: DC

## 2021-01-16 MED ORDER — VITAL AF 1.2 CAL PO LIQD
1000.0000 mL | ORAL | Status: DC
  Administered 2021-01-16 – 2021-01-19 (×4): 1000 mL

## 2021-01-16 MED ORDER — ORAL CARE MOUTH RINSE
15.0000 mL | OROMUCOSAL | Status: DC
  Administered 2021-01-16 – 2021-01-21 (×40): 15 mL via OROMUCOSAL

## 2021-01-16 MED ORDER — CHLORHEXIDINE GLUCONATE 0.12% ORAL RINSE (MEDLINE KIT)
15.0000 mL | Freq: Two times a day (BID) | OROMUCOSAL | Status: DC
  Administered 2021-01-16 – 2021-01-21 (×11): 15 mL via OROMUCOSAL

## 2021-01-16 MED ORDER — POTASSIUM CHLORIDE 10 MEQ/50ML IV SOLN
10.0000 meq | INTRAVENOUS | Status: DC
  Filled 2021-01-16 (×2): qty 50

## 2021-01-16 MED ORDER — HEPARIN SODIUM (PORCINE) 5000 UNIT/ML IJ SOLN
5000.0000 [IU] | Freq: Three times a day (TID) | INTRAMUSCULAR | Status: DC
  Administered 2021-01-16 – 2021-01-28 (×37): 5000 [IU] via SUBCUTANEOUS
  Filled 2021-01-16 (×36): qty 1

## 2021-01-16 NOTE — Plan of Care (Signed)
  Problem: Clinical Measurements: Goal: Ability to maintain clinical measurements within normal limits will improve Outcome: Progressing Goal: Will remain free from infection Outcome: Progressing Goal: Diagnostic test results will improve Outcome: Progressing   Problem: Nutrition: Goal: Adequate nutrition will be maintained Outcome: Progressing   Problem: Elimination: Goal: Will not experience complications related to bowel motility Outcome: Progressing   Problem: Pain Managment: Goal: General experience of comfort will improve Outcome: Progressing   Problem: Safety: Goal: Ability to remain free from injury will improve Outcome: Progressing   Problem: Skin Integrity: Goal: Risk for impaired skin integrity will decrease Outcome: Progressing

## 2021-01-16 NOTE — Progress Notes (Signed)
NAME:  Alexis Ball, MRN:  [id number], DOB:  September 28, 1955, LOS: 1 ADMISSION DATE:  01/15/2021, CONSULTATION DATE: 01/15/2021 REFERRING MD: Emergency department physician, CHIEF COMPLAINT: PEA arrest  History of Present Illness:  65 year old with extensive past medical history as noted below who was found on her porch by a neighbor early a.m. 01/15/2021 unresponsive EMS arrived to find PEA arrest she required intubation multiple rounds of epinephrine chest compressions and coded again in the emergency department x2.  Pulmonary critical care asked to admit note she had drug paraphernalia around her this is presumed drug overdose she does have a history of DVT therefore we will work-up for possible DVT and PE.  Pertinent  Medical History   Past Medical History:  Diagnosis Date   Allergic rhinitis    Ambulates with cane    Full dentures    GERD (gastroesophageal reflux disease)    occasional , takes tums   Glaucoma, both eyes    Heart murmur    evalulated by cardiologist--- dr h. Johney Frame 01-07-2020 note in epic, echo done 01-31-2020 normal w/ no valve abnormalities   History of cellulitis    2006  LLE   History of chest pain    had cardiac cath 10-16-2004 (in epic)  showed normal coronaries that are tortous, patent renal arteries, normal LVF with ef 50-55%, noncardiac chest pain;  pt had ED visit 12-07-2019 negative work-up referred to cardiology, was seen by dr Johney Frame 01-07-2020 atypical cp / non-cardiac echo ordered / no further work-up   History of CVA (cerebrovascular accident) without residual deficits    11-29-2019  per pt happened in 2007 and has not had cva/ tia sympomts since   History of DVT of lower extremity    06-26-2020  per pt many years ago had RLE DVT completed blood thinner treatement stated not other blood clot until 07/ 2021 RUE with superficial venous thrombosis assosiated with PICC line which was removed   History of osteomyelitis 10/2019   left foot  post op  bunionectomy 06/ 2021   Hypertension    followed by pcp   IDA (iron deficiency anemia)    Migraines    11-29-2019 per pt previously had botox injection for prevention, last had 3 yrs ago,  now uses breathing , meditation, and minimizes triggers (bright light/ loud noise)   Neuropathy    feet   OA (osteoarthritis)    Prediabetes    Vascular dementia ([place])    per pcp h&p 06-18-2020 with mild cognitive impairment take, namenda with improvement   Vitamin D deficiency    Wears glasses    Significant Hospital Events: Including procedures, antibiotic start and stop dates in addition to other pertinent events   01/15/2021 found down in PEA, on epi/ NE, re-arrest x 2 in ER, starting zosyn for aspiration pna   01/15/2021 CTH / cervical > no intracranial/ acute trauma findings to cervical, multilevel spinal degenerative changes greatest C5-6, dense consolidative changes at lung apices 01/15/2021 CTA chest/abd/ pelvis > neg dissection/ aneurysm, bilateral dependent lung consolidation, borderline to mild cardiomegaly, RCF venous catheter, minimally displaced anterior left 2nd rib fx 01/15/2021 lower extremity Doppler> negative  10/6 SARS/ flu >neg 10/6 MRSA pcr > neg 10/6 Bcx2 >> 10/6 UC >>neg 10/7 trach asp >  10/6 zosyn  10/7 unasyn >  Interim History / Subjective:  Off pressors since mid-day yesterday Agitated overnight requiring restraints, remains on precedex at 1 Reported neuro exam waxes/wanes, right upward gaze at times, still not f/c  and has non purposeful movement Remains on LTM Tmax 100.8 UOP 2.6L/ 24hrs  Ongoing hypokalemia  Objective   Blood pressure 112/69, pulse (!) 58, temperature 99.5 F (37.5 C), temperature source Bladder, resp. rate (!) 24, weight 90.5 kg, SpO2 100 %.    Vent Mode: PRVC FiO2 (%):  [60 %] 60 % Set Rate:  [24 bmp] 24 bmp Vt Set:  [400 mL] 400 mL PEEP:  [5 cmH20] 5 cmH20 Plateau Pressure:  [14 cmH20-16 cmH20] 14 cmH20   Intake/Output Summary (Last  24 hours) at 01/16/2021 0845 Last data filed at 01/16/2021 0400 Gross per 24 hour  Intake 497.17 ml  Output 2825 ml  Net -2327.83 ml   Filed Weights   01/15/21 2000 01/16/21 0415  Weight: 93.1 kg 90.5 kg   Examination: General:  critically ill older female lying in bed in NAD HEENT: MM pink/moist, ETT/ OGT, pupils 3/reactive, +corneals Neuro: does not respond to verbal or f/c, withdrawals to noxious in LE, no response in UE CV: rr, NSR, no murmur,  R femoral CVC PULM:  non labored on MV, coarse, rhonchi on right with dark brown secretions, +cough GI: obese, soft, bs hypo+, foley   Extremities: warm/dry, no LE edema  Skin: no rashes   Resolved Hospital Problem list    Assessment & Plan:   Acute encephalopathy, metabolic/ toxic and given multiple arrest, concern for anoxic injury Cocaine abuse  CTH neg UDS + cocaine P:  - Ongoing LTM- suggestive of diffuse encephalopathy thus far, no seizures or epileptiform discharges.  - Will need MRI brain hopefully today or tomorrow- family reports she has metal in her right foot, will need to clarify ok w/ MRI - Maintain neuro protective measures; goal for eurothermia, euglycemia, eunatermia, normoxia, and PCO2 goal of 35-40 - avoid fever  - start Nutrition and bowel regiment in place  - minimize sedation as able for RASS goal 0/-1 - drug abuse cessation counseling when appriopiate   PEA cardiac arrest- unclear etiology, possibly secondary to cocaine vs hypokalemia  P:  - remained off vasopressors and hemodynamically stable thus far, lactic cleared - TTE with EF 60-65%, normal RV, valves ok, no wall motion abnormalities  - LE duplex neg and normal RV on TTE, very low risk of PE - ongoing supportive care - trending electrolytes and replete as needed   Acute hypoxic respiratory insufficiency in the setting of cardiac arrest and aspiration pneumonia P:  - Continue MV support, 8cc/kg IBW with goal Pplat <30 and DP<15  - VAP prevention  protocol/ PPI - PAD protocol for sedation> precedex/ prn fentanyl  - wean FiO2 as able for SpO2 >92%  - daily SAT & SBT - sending trach asp- not sent yesterday - de-escalate zosyn -> unasyn  - CXR in am   Hypokalemia Hypomag At risk for AKI given hypoperfusion/ hypotension with cardiac arrest - UOP and renal function remains wnl, reassuring - KCL replete ongoing this morning with mag, repeat BMET this afternoon - serial renal indices, replete electrolytes as needed   Transaminitis P:  - ? If shock liver, normal INR, repeat in am, if still elevated, check hepatitis panel  - no acute hepatobiliary findings on CTA abd/ pelvis 10/6  Hyperglycemia P: - A1c 5.4 - likely stress response - continue SSI   Best Practice (right click and "Reselect all SmartList Selections" daily)   Diet/type: NPO; starting TF 10/7 DVT prophylaxis: prophylactic heparin  GI prophylaxis: PPI Lines: Central line; likely can remove today and go  to PIVs Foley:  Yes, and it is still needed Code Status:  full code Last date of multidisciplinary goals of care discussion:  daughter, Charna Archer, updated at bedside   Labs   CBC: Recent Labs  Lab 01/15/21 0605 01/15/21 0619 01/15/21 0633 01/15/21 0756 01/16/21 0310 01/16/21 0456  WBC 7.7  --   --   --  10.0  --   HGB 11.2* 10.9* 11.2* 12.2 12.2 12.9  HCT 34.9* 32.0* 33.0* 36.0 37.0 38.0  MCV 94.3  --   --   --  90.9  --   PLT 248  --   --   --  213  --     Basic Metabolic Panel: Recent Labs  Lab 01/15/21 0605 01/15/21 0619 01/15/21 0633 01/15/21 0754 01/15/21 0756 01/15/21 1416 01/16/21 0310 01/16/21 0456  NA 137 139 140  --  142 139 137 138  K 4.0 4.2 3.1*  --  2.9* 3.9 2.8* 2.8*  CL 104 107  --   --   --  108 104  --   CO2 21*  --   --   --   --  23 20*  --   GLUCOSE 214* 209*  --   --   --  118* 143*  --   BUN 14 15  --   --   --  14 11  --   CREATININE 1.29* 1.20*  --   --   --  1.06* 1.05*  --   CALCIUM 8.6*  --   --   --    --  8.4* 8.5*  --   MG  --   --   --  2.6*  --   --  1.7  --   PHOS  --   --   --  4.0  --   --   --   --    GFR: Estimated Creatinine Clearance: 57.4 mL/min (A) (by C-G formula based on SCr of 1.05 mg/dL (H)). Recent Labs  Lab 01/15/21 0605 01/15/21 0754 01/15/21 0812 01/15/21 1117 01/15/21 1416 01/16/21 0310  PROCALCITON  --  <0.10  --   --   --   --   WBC 7.7  --   --   --   --  10.0  LATICACIDVEN 3.5*  --  2.6* 1.3 1.3  --     Liver Function Tests: Recent Labs  Lab 01/15/21 0605  AST 358*  ALT 434*  ALKPHOS 62  BILITOT 0.9  PROT 6.7  ALBUMIN 3.4*   Recent Labs  Lab 01/15/21 0754  LIPASE 29  AMYLASE 59   No results for input(s): AMMONIA in the last 168 hours.  ABG    Component Value Date/Time   PHART 7.437 01/16/2021 0456   PCO2ART 31.1 (L) 01/16/2021 0456   PO2ART 82 (L) 01/16/2021 0456   HCO3 20.9 01/16/2021 0456   TCO2 22 01/16/2021 0456   ACIDBASEDEF 2.0 01/16/2021 0456   O2SAT 96.0 01/16/2021 0456     Coagulation Profile: Recent Labs  Lab 01/15/21 0605 01/15/21 0754  INR 1.0 1.2    Cardiac Enzymes: No results for input(s): CKTOTAL, CKMB, CKMBINDEX, TROPONINI in the last 168 hours.  HbA1C: Hgb A1c MFr Bld  Date/Time Value Ref Range Status  01/15/2021 07:54 AM 5.4 4.8 - 5.6 % Final    Comment:    (NOTE) Pre diabetes:          5.7%-6.4%  Diabetes:              >  6.4%  Glycemic control for   <7.0% adults with diabetes   01/31/2020 09:24 AM 5.7 (H) 4.8 - 5.6 % Final    Comment:             Prediabetes: 5.7 - 6.4          Diabetes: >6.4          Glycemic control for adults with diabetes: <7.0     CBG: Recent Labs  Lab 01/15/21 1646 01/15/21 2027 01/16/21 0001 01/16/21 0310 01/16/21 0748  GLUCAP 121* 97 122* 141* 108*     Critical care time: 35 min     Kennieth Rad, ACNP [facility] 01/16/2021, 8:45 AM  See Amion for pager If no response to pager, please call PCCM consult pager After 7:00  pm call [place]ink

## 2021-01-16 NOTE — Procedures (Addendum)
Patient Name: Alexis Ball  MRN: [id number]  Epilepsy Attending: Lora Havens  Referring Physician/Provider: Asa Saunas, NP Duration: 01/15/2021 1250 to 01/16/2021 1028   Patient history: 65 year old female with polysubstance dependence was brought into the emergency department after she was found unresponsive and pulseless by her neighbor, EMS started CPR, ROSC was achieved and patient was transferred to emergency department, she underwent PEA arrest again, ROSC was achieved after 3 minutes of CPR.  EEG to evaluate for seizures.   Level of alertness:comatose   AEDs during EEG study: None   Technical aspects: This EEG study was done with scalp electrodes positioned according to the 10-20 International system of electrode placement. Electrical activity was acquired at a sampling rate of 500Hz  and reviewed with a high frequency filter of 70Hz  and a low frequency filter of 1Hz . EEG data were recorded continuously and digitally stored.    Description: EEG initially showed continuous generalized background attenuation.  Gradually EEG evolved into low amplitude 2 to 3 Hz generalized delta slowing and eventually 3 to 5 Hz generalized theta-delta slowing.  EEG was reactive to tactile stimulation.  Hyperventilation and photic stimulation were not performed.      Of note, parts of study were difficult to interpret due to significant movement and electrode artifact.   ABNORMALITY -Continuous slow, generalized   IMPRESSION: This study is suggestive of moderate to severe diffuse encephalopathy, nonspecific to etiology.  No seizures or epileptiform discharges were seen throughout the recording.  EEG appears to be improving compared to previous day.   [name]

## 2021-01-16 NOTE — Progress Notes (Signed)
eLink Physician-Brief Progress Note Patient Name: CHRISTEN WARDROP DOB: 1955/07/20 MRN: [id number]   Date of Service  01/16/2021  HPI/Events of Note  Agitation - Unable to titrate Precedex up d/t bradycardia.   eICU Interventions  Plan: Versed 2 mg IV Q 4 hours PRN agitation.     Intervention Category Major Interventions: Delirium, psychosis, severe agitation - evaluation and management  Margie Brink Eugene 01/16/2021, 9:16 PM

## 2021-01-16 NOTE — Plan of Care (Signed)
  Problem: Safety: Goal: Non-violent Restraint(s) Outcome: Progressing   Problem: Activity: Goal: Ability to tolerate increased activity will improve Outcome: Progressing   Problem: Respiratory: Goal: Ability to maintain a clear airway and adequate ventilation will improve Outcome: Progressing   Problem: Role Relationship: Goal: Method of communication will improve Outcome: Progressing   Problem: Education: Goal: Knowledge of General Education information will improve Description: Including pain rating scale, medication(s)/side effects and non-pharmacologic comfort measures Outcome: Progressing   Problem: Health Behavior/Discharge Planning: Goal: Ability to manage health-related needs will improve Outcome: Progressing   Problem: Clinical Measurements: Goal: Ability to maintain clinical measurements within normal limits will improve Outcome: Progressing Goal: Will remain free from infection Outcome: Progressing Goal: Diagnostic test results will improve Outcome: Progressing Goal: Respiratory complications will improve Outcome: Progressing Goal: Cardiovascular complication will be avoided Outcome: Progressing   Problem: Activity: Goal: Risk for activity intolerance will decrease Outcome: Progressing   Problem: Nutrition: Goal: Adequate nutrition will be maintained Outcome: Progressing   Problem: Coping: Goal: Level of anxiety will decrease Outcome: Progressing   Problem: Elimination: Goal: Will not experience complications related to bowel motility Outcome: Progressing Goal: Will not experience complications related to urinary retention Outcome: Progressing   Problem: Pain Managment: Goal: General experience of comfort will improve Outcome: Progressing   Problem: Safety: Goal: Ability to remain free from injury will improve Outcome: Progressing   Problem: Skin Integrity: Goal: Risk for impaired skin integrity will decrease Outcome: Progressing

## 2021-01-16 NOTE — Progress Notes (Signed)
Pt transported to MRI and back without any complications.

## 2021-01-16 NOTE — Progress Notes (Signed)
LTM maintenance completed; reglued over 10 leads, pt sweaty and oily. No skin breakdown was seen. Called [facility] to make sure patient was back online.

## 2021-01-16 NOTE — Progress Notes (Signed)
Initial Nutrition Assessment     INTERVENTION:   Tube Feeding via OG:  Vital AF 1.2 at 55 ml/hr Provifes 1584 kcals, 99 g of protein and 1069 mL of free water   NUTRITION DIAGNOSIS:   Inadequate oral intake related to acute illness as evidenced by NPO status.   GOAL:   Patient will meet greater than or equal to 90% of their needs  MONITOR:   TF tolerance, Weight trends, Vent status  REASON FOR ASSESSMENT:   Ventilator Enteral/tube feeding initiation and management  ASSESSMENT:   .65 yo female admitted post cardiac arrest, respiratory failure requiring intubation. UDS cocaine +. PMH includes vascular dementia, Vit D and Iron def anemia, CVA, HTN, GERD  10/06 Admitted, intubated 10/07 TF initiated  Pt remains on vent support, off pressors, agitated overnight on precedex Pt out of room for MRI. LTM discontinued.   OG tube in stomach per chest xray  Edentulous at baseline, has dentures  Unable to obtain diet and weight history at this time  Labs: potassium 2.9 (L) Meds: ss novolog, miralax, colace, potassium chloride  NUTRITION - FOCUSED PHYSICAL EXAM:  Not available, out of room for test  Diet Order:   Diet Order             Diet NPO time specified  Diet effective now                   EDUCATION NEEDS:   No education needs have been identified at this time  Skin:  Skin Assessment: Reviewed RN Assessment  Last BM:  PTA  Height:   Ht Readings from Last 1 Encounters:  12/17/20 5\' 2"  (1.575 m)    Weight:   Wt Readings from Last 1 Encounters:  01/16/21 90.5 kg    BMI:  Body mass index is 36.49 kg/m.  Estimated Nutritional Needs:   Kcal:  1500-1700 kcals  Protein:  75-90 g  Fluid:  >/= 1.5 L   Kerman Passey MS, RDN, LDN, CNSC Registered Dietitian III Clinical Nutrition RD Pager and On-Call Pager Number Located in [place]

## 2021-01-16 NOTE — Progress Notes (Signed)
eLink Physician-Brief Progress Note Patient Name: Alexis Ball DOB: 20-Sep-1955 MRN: [id number]   Date of Service  01/16/2021  HPI/Events of Note  Agitation - Patient attempting to slide out of bed and reaching for ETT. Precedex maxed out at 0.6 mcg/kg/hour. Nursing request for bilateral soft wrist restraints.   eICU Interventions  Plan: Increased ceiling of Precedex IV infusion to 1.0 mcg/kg/hour. Titrate to RASS = 0. Bilateral soft wrist restraints X 7 hours.      Intervention Category Major Interventions: Delirium, psychosis, severe agitation - evaluation and management  Tiberius Loftus Eugene 01/16/2021, 2:26 AM

## 2021-01-16 NOTE — Progress Notes (Signed)
LTM discontinued; no skin breakdown was seen. [facility] notified of D/C. 

## 2021-01-16 NOTE — Progress Notes (Signed)
0700 - Report received from [place], [place].  All questions answered.  Safety checks performed. Hand hygiene performed before/after each pt contact. [place], NP rounded on pt. 0900 - Pt's family arrived to visit. 1110 - Pt transported to MRI.  In MRI, pt became agitated and order for 2 mg of midazolam was placed.  Pt calmed with midazolam administration. 1230 - TF initiated at 10 mL/hr. 1630 - TF increased to 20 mL/hr. 1800 - CVC d/c'd per order. 1900 - Report given to PM RN.  All questions answered.  Multiple family members and one neighbor visited pt today.  N: Pt intermittently agitated; unable to follow commands; moves extremities with equal strength when she moves them. R: Vent; 35%, PEEP 5, RR 20, Vt 400 - clear/diminished L/S C: SB GI: Vital AF @ 20 mL/hr with 30 mL q 4 hr flushes.  Goal: 55 mL/hr. GU: Foley, low UOP, amber colored urine. Skin: Intact Musk: moves arms/legs equally, strong, unable to follow commands.  Access: 20 R AC; 20G L hand

## 2021-01-17 ENCOUNTER — Inpatient Hospital Stay (HOSPITAL_COMMUNITY): Payer: Medicare Other

## 2021-01-17 DIAGNOSIS — E876 Hypokalemia: Secondary | ICD-10-CM | POA: Diagnosis not present

## 2021-01-17 DIAGNOSIS — J9601 Acute respiratory failure with hypoxia: Secondary | ICD-10-CM | POA: Diagnosis not present

## 2021-01-17 DIAGNOSIS — I469 Cardiac arrest, cause unspecified: Secondary | ICD-10-CM | POA: Diagnosis not present

## 2021-01-17 DIAGNOSIS — N179 Acute kidney failure, unspecified: Secondary | ICD-10-CM

## 2021-01-17 LAB — MAGNESIUM
Magnesium: 1.9 mg/dL (ref 1.7–2.4)
Magnesium: 2.2 mg/dL (ref 1.7–2.4)

## 2021-01-17 LAB — PHOSPHORUS
Phosphorus: 2 mg/dL — ABNORMAL LOW (ref 2.5–4.6)
Phosphorus: 2.8 mg/dL (ref 2.5–4.6)

## 2021-01-17 LAB — COMPREHENSIVE METABOLIC PANEL
ALT: 281 U/L — ABNORMAL HIGH (ref 0–44)
AST: 97 U/L — ABNORMAL HIGH (ref 15–41)
Albumin: 2.9 g/dL — ABNORMAL LOW (ref 3.5–5.0)
Alkaline Phosphatase: 55 U/L (ref 38–126)
Anion gap: 7 (ref 5–15)
BUN: 13 mg/dL (ref 8–23)
CO2: 21 mmol/L — ABNORMAL LOW (ref 22–32)
Calcium: 8.6 mg/dL — ABNORMAL LOW (ref 8.9–10.3)
Chloride: 110 mmol/L (ref 98–111)
Creatinine, Ser: 0.75 mg/dL (ref 0.44–1.00)
GFR, Estimated: >60 mL/min (ref >60)
Glucose, Bld: 137 mg/dL — ABNORMAL HIGH (ref 70–99)
Potassium: 3.7 mmol/L (ref 3.5–5.1)
Sodium: 138 mmol/L (ref 135–145)
Total Bilirubin: 1 mg/dL (ref 0.3–1.2)
Total Protein: 6.2 g/dL — ABNORMAL LOW (ref 6.5–8.1)

## 2021-01-17 LAB — CBC
HCT: 34.3 % — ABNORMAL LOW (ref 36.0–46.0)
Hemoglobin: 11.4 g/dL — ABNORMAL LOW (ref 12.0–15.0)
MCH: 30.2 pg (ref 26.0–34.0)
MCHC: 33.2 g/dL (ref 30.0–36.0)
MCV: 90.7 fL (ref 80.0–100.0)
Platelets: 167 10*3/uL (ref 150–400)
RBC: 3.78 MIL/uL — ABNORMAL LOW (ref 3.87–5.11)
RDW: 13 % (ref 11.5–15.5)
WBC: 12 10*3/uL — ABNORMAL HIGH (ref 4.0–10.5)
nRBC: 0 % (ref 0.0–0.2)

## 2021-01-17 LAB — GLUCOSE, CAPILLARY
Glucose-Capillary: 118 mg/dL — ABNORMAL HIGH (ref 70–99)
Glucose-Capillary: 123 mg/dL — ABNORMAL HIGH (ref 70–99)
Glucose-Capillary: 126 mg/dL — ABNORMAL HIGH (ref 70–99)
Glucose-Capillary: 129 mg/dL — ABNORMAL HIGH (ref 70–99)
Glucose-Capillary: 145 mg/dL — ABNORMAL HIGH (ref 70–99)

## 2021-01-17 MED ORDER — DORZOLAMIDE HCL-TIMOLOL MAL 2-0.5 % OP SOLN
1.0000 [drp] | Freq: Two times a day (BID) | OPHTHALMIC | Status: DC
  Administered 2021-01-17 – 2021-01-28 (×23): 1 [drp] via OPHTHALMIC
  Filled 2021-01-17 (×2): qty 10

## 2021-01-17 MED ORDER — LATANOPROST 0.005 % OP SOLN
1.0000 [drp] | Freq: Every day | OPHTHALMIC | Status: DC
  Administered 2021-01-17 – 2021-01-27 (×11): 1 [drp] via OPHTHALMIC
  Filled 2021-01-17: qty 2.5

## 2021-01-17 MED ORDER — FENTANYL CITRATE PF 50 MCG/ML IJ SOSY
50.0000 ug | PREFILLED_SYRINGE | INTRAMUSCULAR | Status: DC | PRN
  Administered 2021-01-18: 50 ug via INTRAVENOUS
  Filled 2021-01-17: qty 1

## 2021-01-17 MED ORDER — BRIMONIDINE TARTRATE 0.15 % OP SOLN
1.0000 [drp] | Freq: Two times a day (BID) | OPHTHALMIC | Status: DC
  Administered 2021-01-17 – 2021-01-28 (×23): 1 [drp] via OPHTHALMIC
  Filled 2021-01-17: qty 5

## 2021-01-17 MED ORDER — POTASSIUM PHOSPHATES 15 MMOLE/5ML IV SOLN
30.0000 mmol | Freq: Once | INTRAVENOUS | Status: AC
  Administered 2021-01-17: 30 mmol via INTRAVENOUS
  Filled 2021-01-17: qty 10

## 2021-01-17 MED ORDER — BLISTEX MEDICATED EX OINT
TOPICAL_OINTMENT | CUTANEOUS | Status: DC | PRN
  Filled 2021-01-17: qty 6.3

## 2021-01-17 MED ORDER — POLYVINYL ALCOHOL 1.4 % OP SOLN
1.0000 [drp] | OPHTHALMIC | Status: DC | PRN
  Administered 2021-01-18 – 2021-01-25 (×4): 1 [drp] via OPHTHALMIC
  Filled 2021-01-17: qty 15

## 2021-01-17 MED ORDER — MAGNESIUM SULFATE 2 GM/50ML IV SOLN
2.0000 g | Freq: Once | INTRAVENOUS | Status: AC
  Administered 2021-01-17: 2 g via INTRAVENOUS
  Filled 2021-01-17: qty 50

## 2021-01-17 MED ORDER — FENTANYL CITRATE PF 50 MCG/ML IJ SOSY
50.0000 ug | PREFILLED_SYRINGE | INTRAMUSCULAR | Status: DC | PRN
  Administered 2021-01-17 – 2021-01-18 (×8): 100 ug via INTRAVENOUS
  Filled 2021-01-17 (×7): qty 2

## 2021-01-17 NOTE — Progress Notes (Signed)
0700 - Report received from PM RN.  All questions answered.  Safety checks performed.  Hand hygiene performed before/after each pt contact. 0830 - Dr. Tacy Learn and Nevin Bloodgood, NP rounded.   0900 - Assessment & Rx.   1600 - Pt w/large bowel movement.  Pt cleaned, had another bowel movement, and cleaned again.  Pt agitated during bath.  Precedex increased and Fentanyl administered. 1700 - Pt's SpO2 sensor quit reading.  Same was replaced.  Pt's SpO2 was in the low 70s w/pt's RR in the 40s.  SBT terminated and pt placed back on a rate.  Pt's RR slowed and SpO2 increased. 1800 - Pt w/3rd BM.  Nevin Bloodgood, NP notified of same.  Flexiseal order placed.  Flexi placed w/o complication. 1900 - Report given to PM RN.  All questions answered.  Multiple family members and friends visited pt today.   N: Pt intermittently agitated; unable to follow commands; moves extremities with equal strength when she moves them. R: SBT today from 0350-0938.  Vent; 40%, PEEP 5, RR 20, Vt 400 - clear/diminished L/S w/some ronchi noted. C: SB GI: Vital AF @ 55 mL/hr with 30 mL q 4 hr flushes. GU: Foley, increased UOP, amber colored urine. Skin: Intact Musk: moves arms/legs equally, strong, unable to follow commands.  Access: 20 R AC; 20G L hand

## 2021-01-17 NOTE — Progress Notes (Signed)
NAME:  Alexis Ball, MRN:  [id number], DOB:  02/24/1956, LOS: 2 ADMISSION DATE:  01/15/2021, CONSULTATION DATE: 01/15/2021 REFERRING MD: Emergency department physician, CHIEF COMPLAINT: PEA arrest  History of Present Illness:  65 year old with extensive past medical history as noted below who was found on her porch by a neighbor early a.m. 01/15/2021 unresponsive EMS arrived to find PEA arrest she required intubation multiple rounds of epinephrine chest compressions and coded again in the emergency department x2.  Pulmonary critical care asked to admit note she had drug paraphernalia around her this is presumed drug overdose she does have a history of DVT therefore we will work-up for possible DVT and PE.  Pertinent  Medical History   Past Medical History:  Diagnosis Date   Allergic rhinitis    Ambulates with cane    Full dentures    GERD (gastroesophageal reflux disease)    occasional , takes tums   Glaucoma, both eyes    Heart murmur    evalulated by cardiologist--- dr h. Johney Frame 01-07-2020 note in epic, echo done 01-31-2020 normal w/ no valve abnormalities   History of cellulitis    2006  LLE   History of chest pain    had cardiac cath 10-16-2004 (in epic)  showed normal coronaries that are tortous, patent renal arteries, normal LVF with ef 50-55%, noncardiac chest pain;  pt had ED visit 12-07-2019 negative work-up referred to cardiology, was seen by dr Johney Frame 01-07-2020 atypical cp / non-cardiac echo ordered / no further work-up   History of CVA (cerebrovascular accident) without residual deficits    11-29-2019  per pt happened in 2007 and has not had cva/ tia sympomts since   History of DVT of lower extremity    06-26-2020  per pt many years ago had RLE DVT completed blood thinner treatement stated not other blood clot until 07/ 2021 RUE with superficial venous thrombosis assosiated with PICC line which was removed   History of osteomyelitis 10/2019   left foot  post op  bunionectomy 06/ 2021   Hypertension    followed by pcp   IDA (iron deficiency anemia)    Migraines    11-29-2019 per pt previously had botox injection for prevention, last had 3 yrs ago,  now uses breathing , meditation, and minimizes triggers (bright light/ loud noise)   Neuropathy    feet   OA (osteoarthritis)    Prediabetes    Vascular dementia ([place])    per pcp h&p 06-18-2020 with mild cognitive impairment take, namenda with improvement   Vitamin D deficiency    Wears glasses    Significant Hospital Events: Including procedures, antibiotic start and stop dates in addition to other pertinent events   01/15/2021 found down in PEA, on epi/ NE, re-arrest x 2 in ER, starting zosyn for aspiration pna, off pressors overnight 10/7 intermittently agitated, on precedex, prn versed, LTM d/c'd- neg seizure, MRI brain  01/15/2021 CTH / cervical > no intracranial/ acute trauma findings to cervical, multilevel spinal degenerative changes greatest C5-6, dense consolidative changes at lung apices 01/15/2021 CTA chest/abd/ pelvis > neg dissection/ aneurysm, bilateral dependent lung consolidation, borderline to mild cardiomegaly, RCF venous catheter, minimally displaced anterior left 2nd rib fx 01/15/2021 lower extremity Doppler> negative 10/7 MRI brain >> non acute  10/6 SARS/ flu >neg 10/6 MRSA pcr > neg 10/6 Bcx2 >> 10/6 UC >>neg 10/7 trach asp >  10/6 zosyn  10/7 unasyn >  Interim History / Subjective:  Intermittent agitated s/p versed overnight  Afebrile Tolerating SBT 8/5 thus far   Neighbor of patient reported to staff last evening that they have a new dealer and wasn't sure what was in the crack they smoked  Objective   Blood pressure 110/74, pulse (!) 51, temperature 99.9 F (37.7 C), resp. rate 16, weight 90.1 kg, SpO2 100 %.    Vent Mode: CPAP FiO2 (%):  [35 %] 35 % Set Rate:  [20 bmp] 20 bmp Vt Set:  [400 mL] 400 mL PEEP:  [5 cmH20] 5 cmH20 Pressure Support:  [8 cmH20] 8  cmH20 Plateau Pressure:  [17 cmH20-20 cmH20] 18 cmH20   Intake/Output Summary (Last 24 hours) at 01/17/2021 1129 Last data filed at 01/17/2021 1000 Gross per 24 hour  Intake 1775.74 ml  Output 750 ml  Net 1025.74 ml   Filed Weights   01/15/21 2000 01/16/21 0415 01/17/21 0400  Weight: 93.1 kg 90.5 kg 90.1 kg   Examination: General:  critically ill older female lying in bed in NAD HEENT: MM pink/moist, ETT/ OGT, pupils 3/reactive, +corneals Neuro: does not respond to verbal or f/c, withdrawals to noxious in LE, no response in UE CV: rr, NSR, no murmur,  R femoral CVC PULM:  non labored on MV, coarse, rhonchi on right with dark brown secretions, +cough GI: obese, soft, bs hypo+, foley   Extremities: warm/dry, no LE edema  Skin: no rashes   Resolved Hospital Problem list    Assessment & Plan:   Acute encephalopathy, metabolic/ toxic and given multiple arrest, concern for anoxic injury Cocaine abuse  CTH neg UDS + cocaine P:  - MRI brain 10/7 no acute process, no evidence of hyoxia - LTM stopped 10/7, no seizure activity, severe diffuse encephalopathy  - possibly with her drug abuse prior her arrest, it could have been laced with substance that doesn't show in our tox screen.  Will order lab send out for fentanyl/ norfentanyl screen - Likely needs time for encephalopathy to resolve.  Minimize/ stop benzos, continue w/ prn fentanyl, precedex gtt and consider adding low dose Seroquel  - Maintain neuro protective measures; goal for eurothermia, euglycemia, eunatermia, normoxia, and PCO2 goal of 35-40   PEA cardiac arrest- unclear etiology, possibly secondary to cocaine vs hypokalemia  - TTE with EF 60-65%, normal RV, valves ok, no wall motion abnormalities  - LE duplex neg and normal RV on TTE, very low risk of PE P:  - remains hemodynamically stable off pressors - continue supportive care   Acute hypoxic respiratory insufficiency in the setting of cardiac arrest and aspiration  pneumonia P:  - Continue MV support, 8cc/kg IBW with goal Pplat <30 and DP<15  - VAP prevention protocol/ PPI - PAD protocol for sedation> precedex/ prn fentanyl  - wean FiO2 as able for SpO2 >92%  - daily SAT & SBT, mental status still remains a barrier  - follow trach asp, continue unasyn for now - CXR stable devices, ongoing LLL opacity   Hypokalemia, improving Hypophos P:  - kphos replete today  - Trend BMP / urinary output - Replace electrolytes as indicated - Avoid nephrotoxic agents, ensure adequate renal perfusion  Transaminitis P:  - continues to improve, likely shock liver  - no acute hepatobiliary findings on CTA abd/ pelvis 10/6   Hyperglycemia P: - A1c 5.4 - likely stress response - continue SSI   Best Practice (right click and "Reselect all SmartList Selections" daily)   Diet/type: NPO; TF DVT prophylaxis: prophylactic heparin  GI prophylaxis: PPI Lines: N/A Foley:  Yes, and it is still needed Code Status:  full code Last date of multidisciplinary goals of care discussion:  daughter, Charna Archer, updated at bedside 10/8   Labs   CBC: Recent Labs  Lab 01/15/21 0605 01/15/21 0619 01/15/21 7846 01/15/21 0756 01/16/21 0310 01/16/21 0456 01/17/21 0928  WBC 7.7  --   --   --  10.0  --  12.0*  HGB 11.2*   < > 11.2* 12.2 12.2 12.9 11.4*  HCT 34.9*   < > 33.0* 36.0 37.0 38.0 34.3*  MCV 94.3  --   --   --  90.9  --  90.7  PLT 248  --   --   --  213  --  167   < > = values in this interval not displayed.    Basic Metabolic Panel: Recent Labs  Lab 01/15/21 0605 01/15/21 0619 01/15/21 9629 01/15/21 0754 01/15/21 0756 01/15/21 1416 01/16/21 0310 01/16/21 0456 01/16/21 1756 01/17/21 0928  NA 137 139   < >  --    < > 139 137 138 136 138  K 4.0 4.2   < >  --    < > 3.9 2.8* 2.8* 4.9 3.7  CL 104 107  --   --   --  108 104  --  108 110  CO2 21*  --   --   --   --  23 20*  --  23 21*  GLUCOSE 214* 209*  --   --   --  118* 143*  --  133*  137*  BUN 14 15  --   --   --  14 11  --  11 13  CREATININE 1.29* 1.20*  --   --   --  1.06* 1.05*  --  0.84 0.75  CALCIUM 8.6*  --   --   --   --  8.4* 8.5*  --  8.7* 8.6*  MG  --   --   --  2.6*  --   --  1.7  --  2.2 1.9  PHOS  --   --   --  4.0  --   --  2.9  --  1.6* 2.0*   < > = values in this interval not displayed.   GFR: Estimated Creatinine Clearance: 75.1 mL/min (by C-G formula based on SCr of 0.75 mg/dL). Recent Labs  Lab 01/15/21 0605 01/15/21 0754 01/15/21 0812 01/15/21 1117 01/15/21 1416 01/16/21 0310 01/17/21 0928  PROCALCITON  --  <0.10  --   --   --   --   --   WBC 7.7  --   --   --   --  10.0 12.0*  LATICACIDVEN 3.5*  --  2.6* 1.3 1.3  --   --     Liver Function Tests: Recent Labs  Lab 01/15/21 0605 01/16/21 0310 01/17/21 0928  AST 358* 396* 97*  ALT 434* 513* 281*  ALKPHOS 62 61 55  BILITOT 0.9 1.6* 1.0  PROT 6.7 6.6 6.2*  ALBUMIN 3.4* 3.3* 2.9*   Recent Labs  Lab 01/15/21 0754  LIPASE 29  AMYLASE 59   No results for input(s): AMMONIA in the last 168 hours.  ABG    Component Value Date/Time   PHART 7.437 01/16/2021 0456   PCO2ART 31.1 (L) 01/16/2021 0456   PO2ART 82 (L) 01/16/2021 0456   HCO3 20.9 01/16/2021 0456   TCO2 22 01/16/2021 0456   ACIDBASEDEF 2.0 01/16/2021 0456   O2SAT  96.0 01/16/2021 0456     Coagulation Profile: Recent Labs  Lab 01/15/21 0605 01/15/21 0754  INR 1.0 1.2    Cardiac Enzymes: No results for input(s): CKTOTAL, CKMB, CKMBINDEX, TROPONINI in the last 168 hours.  HbA1C: Hgb A1c MFr Bld  Date/Time Value Ref Range Status  01/15/2021 07:54 AM 5.4 4.8 - 5.6 % Final    Comment:    (NOTE) Pre diabetes:          5.7%-6.4%  Diabetes:              >6.4%  Glycemic control for   <7.0% adults with diabetes   01/31/2020 09:24 AM 5.7 (H) 4.8 - 5.6 % Final    Comment:             Prediabetes: 5.7 - 6.4          Diabetes: >6.4          Glycemic control for adults with diabetes: <7.0     CBG: Recent  Labs  Lab 01/16/21 1543 01/16/21 2007 01/16/21 2329 01/17/21 0307 01/17/21 0746  GLUCAP 118* 137* 78 118* 123*     Critical care time: 30 min     Kennieth Rad, ACNP [facility] 01/17/2021, 11:29 AM  See Shea Evans for pager If no response to pager, please call PCCM consult pager After 7:00 pm call [place]

## 2021-01-17 NOTE — Plan of Care (Signed)
  Problem: Respiratory: Goal: Ability to maintain a clear airway and adequate ventilation will improve Outcome: Progressing   

## 2021-01-17 NOTE — Plan of Care (Signed)
  Problem: Safety: Goal: Non-violent Restraint(s) Outcome: Progressing   Problem: Activity: Goal: Ability to tolerate increased activity will improve Outcome: Progressing   Problem: Clinical Measurements: Goal: Diagnostic test results will improve Outcome: Progressing Goal: Respiratory complications will improve Outcome: Progressing Goal: Cardiovascular complication will be avoided Outcome: Progressing   Problem: Coping: Goal: Level of anxiety will decrease Outcome: Progressing

## 2021-01-17 NOTE — Progress Notes (Signed)
Placed on CPAP 8/5 by MD. Patient tolerating well.

## 2021-01-18 DIAGNOSIS — I469 Cardiac arrest, cause unspecified: Secondary | ICD-10-CM | POA: Diagnosis not present

## 2021-01-18 LAB — CULTURE, RESPIRATORY W GRAM STAIN

## 2021-01-18 LAB — BASIC METABOLIC PANEL
Anion gap: 6 (ref 5–15)
BUN: 14 mg/dL (ref 8–23)
CO2: 22 mmol/L (ref 22–32)
Calcium: 8.6 mg/dL — ABNORMAL LOW (ref 8.9–10.3)
Chloride: 108 mmol/L (ref 98–111)
Creatinine, Ser: 0.75 mg/dL (ref 0.44–1.00)
GFR, Estimated: >60 mL/min (ref >60)
Glucose, Bld: 127 mg/dL — ABNORMAL HIGH (ref 70–99)
Potassium: 4.2 mmol/L (ref 3.5–5.1)
Sodium: 136 mmol/L (ref 135–145)

## 2021-01-18 LAB — CBC
HCT: 34.6 % — ABNORMAL LOW (ref 36.0–46.0)
Hemoglobin: 11.2 g/dL — ABNORMAL LOW (ref 12.0–15.0)
MCH: 29.8 pg (ref 26.0–34.0)
MCHC: 32.4 g/dL (ref 30.0–36.0)
MCV: 92 fL (ref 80.0–100.0)
Platelets: 209 K/uL (ref 150–400)
RBC: 3.76 MIL/uL — ABNORMAL LOW (ref 3.87–5.11)
RDW: 13.1 % (ref 11.5–15.5)
WBC: 11.7 K/uL — ABNORMAL HIGH (ref 4.0–10.5)
nRBC: 0 % (ref 0.0–0.2)

## 2021-01-18 LAB — GLUCOSE, CAPILLARY
Glucose-Capillary: 110 mg/dL — ABNORMAL HIGH (ref 70–99)
Glucose-Capillary: 112 mg/dL — ABNORMAL HIGH (ref 70–99)
Glucose-Capillary: 131 mg/dL — ABNORMAL HIGH (ref 70–99)
Glucose-Capillary: 133 mg/dL — ABNORMAL HIGH (ref 70–99)
Glucose-Capillary: 144 mg/dL — ABNORMAL HIGH (ref 70–99)
Glucose-Capillary: 145 mg/dL — ABNORMAL HIGH (ref 70–99)
Glucose-Capillary: 99 mg/dL (ref 70–99)

## 2021-01-18 LAB — MAGNESIUM: Magnesium: 2.1 mg/dL (ref 1.7–2.4)

## 2021-01-18 LAB — AMMONIA: Ammonia: 54 umol/L — ABNORMAL HIGH (ref 9–35)

## 2021-01-18 MED ORDER — FENTANYL 2500MCG IN NS 250ML (10MCG/ML) PREMIX INFUSION
50.0000 ug/h | INTRAVENOUS | Status: DC
Start: 2021-01-18 — End: 2021-01-20
  Administered 2021-01-18 – 2021-01-19 (×3): 100 ug/h via INTRAVENOUS
  Administered 2021-01-20: 200 ug/h via INTRAVENOUS
  Filled 2021-01-18 (×4): qty 250

## 2021-01-18 MED ORDER — CHLORHEXIDINE GLUCONATE CLOTH 2 % EX PADS
6.0000 | MEDICATED_PAD | Freq: Every day | CUTANEOUS | Status: DC
  Administered 2021-01-19 – 2021-01-27 (×7): 6 via TOPICAL

## 2021-01-18 NOTE — Plan of Care (Signed)
  Problem: Safety: Goal: Non-violent Restraint(s) Outcome: Completed/Met

## 2021-01-18 NOTE — Progress Notes (Signed)
NAME:  Alexis Ball, MRN:  [id number], DOB:  Feb 05, 1956, LOS: 3 ADMISSION DATE:  01/15/2021, CONSULTATION DATE: 01/15/2021 REFERRING MD: Emergency department physician, CHIEF COMPLAINT: PEA arrest  History of Present Illness:  65 year old with extensive past medical history as noted below who was found on her porch by a neighbor early a.m. 01/15/2021 unresponsive EMS arrived to find PEA arrest she required intubation multiple rounds of epinephrine chest compressions and coded again in the emergency department x2.  Pulmonary critical care asked to admit note she had drug paraphernalia around her this is presumed drug overdose she does have a history of DVT therefore we will work-up for possible DVT and PE.  FYI: Neighbor of patient reported to staff last evening that they have a new dealer and wasn't sure what was in the crack they smoked  Pertinent  Medical History   Past Medical History:  Diagnosis Date   Allergic rhinitis    Ambulates with cane    Full dentures    GERD (gastroesophageal reflux disease)    occasional , takes tums   Glaucoma, both eyes    Heart murmur    evalulated by cardiologist--- dr h. Johney Frame 01-07-2020 note in epic, echo done 01-31-2020 normal w/ no valve abnormalities   History of cellulitis    2006  LLE   History of chest pain    had cardiac cath 10-16-2004 (in epic)  showed normal coronaries that are tortous, patent renal arteries, normal LVF with ef 50-55%, noncardiac chest pain;  pt had ED visit 12-07-2019 negative work-up referred to cardiology, was seen by dr Johney Frame 01-07-2020 atypical cp / non-cardiac echo ordered / no further work-up   History of CVA (cerebrovascular accident) without residual deficits    11-29-2019  per pt happened in 2007 and has not had cva/ tia sympomts since   History of DVT of lower extremity    06-26-2020  per pt many years ago had RLE DVT completed blood thinner treatement stated not other blood clot until 07/ 2021 RUE with  superficial venous thrombosis assosiated with PICC line which was removed   History of osteomyelitis 10/2019   left foot  post op bunionectomy 06/ 2021   Hypertension    followed by pcp   IDA (iron deficiency anemia)    Migraines    11-29-2019 per pt previously had botox injection for prevention, last had 3 yrs ago,  now uses breathing , meditation, and minimizes triggers (bright light/ loud noise)   Neuropathy    feet   OA (osteoarthritis)    Prediabetes    Vascular dementia ([place])    per pcp h&p 06-18-2020 with mild cognitive impairment take, namenda with improvement   Vitamin D deficiency    Wears glasses    Significant Hospital Events: Including procedures, antibiotic start and stop dates in addition to other pertinent events   01/15/2021 found down in PEA, on epi/ NE, re-arrest x 2 in ER, starting zosyn for aspiration pna, off pressors overnight 10/7 intermittently agitated, on precedex, prn versed, LTM d/c'd- neg seizure, MRI brain  01/15/2021 CTH / cervical > no intracranial/ acute trauma findings to cervical, multilevel spinal degenerative changes greatest C5-6, dense consolidative changes at lung apices 01/15/2021 CTA chest/abd/ pelvis > neg dissection/ aneurysm, bilateral dependent lung consolidation, borderline to mild cardiomegaly, RCF venous catheter, minimally displaced anterior left 2nd rib fx 01/15/2021 lower extremity Doppler> negative 10/7 MRI brain >> non acute  10/6 SARS/ flu >neg 10/6 MRSA pcr > neg 10/6 Bcx2 >>  10/6 UC >>neg 10/7 trach asp >  10/6 zosyn  10/7 unasyn >  Interim History / Subjective:   Remains critically ill, intubated on life support   Objective   Blood pressure 127/67, pulse (!) 54, temperature 98.3 F (36.8 C), temperature source Axillary, resp. rate 18, weight 91.4 kg, SpO2 96 %.    Vent Mode: PSV;CPAP FiO2 (%):  [35 %-100 %] 40 % Set Rate:  [20 bmp] 20 bmp Vt Set:  [400 mL] 400 mL PEEP:  [5 cmH20] 5 cmH20 Pressure Support:  [5  cmH20-8 cmH20] 5 cmH20 Plateau Pressure:  [19 cmH20-21 cmH20] 21 cmH20   Intake/Output Summary (Last 24 hours) at 01/18/2021 0845 Last data filed at 01/18/2021 0700 Gross per 24 hour  Intake 2235.18 ml  Output 475 ml  Net 1760.18 ml   Filed Weights   01/16/21 0415 01/17/21 0400 01/18/21 0411  Weight: 90.5 kg 90.1 kg 91.4 kg   Examination: General:  critically ill, intubated on life support  HEENT: opens eyes, does not track, corneals+  Neuro: spontaneous movements but no withdraw to pain  CV: RRR, s1 s2  PULM:  CTAB, BL vented breaths  GI: obese soft nt nd  Extremities: no edema  Skin: no rash   Resolved Hospital Problem list    Assessment & Plan:   Acute encephalopathy, metabolic/ toxic and given multiple arrest, concern for anoxic injury Cocaine abuse  CTH neg UDS + cocaine P:  - MRI brain neg, EEG no seizure  - U tox sent for fentanyl  - I think she still needs time to see what her brain does  - supportive care post arrest   PEA cardiac arrest- unclear etiology, possibly secondary to cocaine vs hypokalemia  - TTE with EF 60-65%, normal RV, valves ok, no wall motion abnormalities  - LE duplex neg and normal RV on TTE, very low risk of PE P:  - supportive care post arrest   Acute hypoxic respiratory insufficiency in the setting of cardiac arrest and aspiration pneumonia P:  - full vent support  - LTVV VAP ppx PAD sedation  SAT SBT as tolerated  LLL infiltrate, follow on cxr   Hypokalemia, improving Hypophos P:  Replete as needed  Follow BMET   Transaminitis P:  - suspect from post arrest shock liver - follow   Hyperglycemia P: - continue ssi    Best Practice (right click and "Reselect all SmartList Selections" daily)   Diet/type: NPO; TF DVT prophylaxis: prophylactic heparin  GI prophylaxis: PPI Lines: N/A Foley:  Yes, and it is still needed Code Status:  full code Last date of multidisciplinary goals of care discussion:  daughter, Charna Archer, updated at bedside 10/8   Labs   CBC: Recent Labs  Lab 01/15/21 0605 01/15/21 0619 01/15/21 0756 01/16/21 0310 01/16/21 0456 01/17/21 0928 01/18/21 0253  WBC 7.7  --   --  10.0  --  12.0* 11.7*  HGB 11.2*   < > 12.2 12.2 12.9 11.4* 11.2*  HCT 34.9*   < > 36.0 37.0 38.0 34.3* 34.6*  MCV 94.3  --   --  90.9  --  90.7 92.0  PLT 248  --   --  213  --  167 209   < > = values in this interval not displayed.    Basic Metabolic Panel: Recent Labs  Lab 01/15/21 0754 01/15/21 0756 01/15/21 1416 01/16/21 0310 01/16/21 0456 01/16/21 1756 01/17/21 0928 01/17/21 1758 01/18/21 0253  NA  --    < >  139 137 138 136 138  --  136  K  --    < > 3.9 2.8* 2.8* 4.9 3.7  --  4.2  CL  --   --  108 104  --  108 110  --  108  CO2  --   --  23 20*  --  23 21*  --  22  GLUCOSE  --   --  118* 143*  --  133* 137*  --  127*  BUN  --   --  14 11  --  11 13  --  14  CREATININE  --   --  1.06* 1.05*  --  0.84 0.75  --  0.75  CALCIUM  --   --  8.4* 8.5*  --  8.7* 8.6*  --  8.6*  MG 2.6*  --   --  1.7  --  2.2 1.9 2.2 2.1  PHOS 4.0  --   --  2.9  --  1.6* 2.0* 2.8  --    < > = values in this interval not displayed.   GFR: Estimated Creatinine Clearance: 75.7 mL/min (by C-G formula based on SCr of 0.75 mg/dL). Recent Labs  Lab 01/15/21 0605 01/15/21 0754 01/15/21 0812 01/15/21 1117 01/15/21 1416 01/16/21 0310 01/17/21 0928 01/18/21 0253  PROCALCITON  --  <0.10  --   --   --   --   --   --   WBC 7.7  --   --   --   --  10.0 12.0* 11.7*  LATICACIDVEN 3.5*  --  2.6* 1.3 1.3  --   --   --     Liver Function Tests: Recent Labs  Lab 01/15/21 0605 01/16/21 0310 01/17/21 0928  AST 358* 396* 97*  ALT 434* 513* 281*  ALKPHOS 62 61 55  BILITOT 0.9 1.6* 1.0  PROT 6.7 6.6 6.2*  ALBUMIN 3.4* 3.3* 2.9*   Recent Labs  Lab 01/15/21 0754  LIPASE 29  AMYLASE 59   Recent Labs  Lab 01/18/21 0225  AMMONIA 54*    ABG    Component Value Date/Time   PHART 7.437 01/16/2021 0456    PCO2ART 31.1 (L) 01/16/2021 0456   PO2ART 82 (L) 01/16/2021 0456   HCO3 20.9 01/16/2021 0456   TCO2 22 01/16/2021 0456   ACIDBASEDEF 2.0 01/16/2021 0456   O2SAT 96.0 01/16/2021 0456     Coagulation Profile: Recent Labs  Lab 01/15/21 0605 01/15/21 0754  INR 1.0 1.2    Cardiac Enzymes: No results for input(s): CKTOTAL, CKMB, CKMBINDEX, TROPONINI in the last 168 hours.  HbA1C: Hgb A1c MFr Bld  Date/Time Value Ref Range Status  01/15/2021 07:54 AM 5.4 4.8 - 5.6 % Final    Comment:    (NOTE) Pre diabetes:          5.7%-6.4%  Diabetes:              >6.4%  Glycemic control for   <7.0% adults with diabetes   01/31/2020 09:24 AM 5.7 (H) 4.8 - 5.6 % Final    Comment:             Prediabetes: 5.7 - 6.4          Diabetes: >6.4          Glycemic control for adults with diabetes: <7.0     CBG: Recent Labs  Lab 01/17/21 1551 01/17/21 1920 01/18/21 0000 01/18/21 0403 01/18/21 0729  GLUCAP 129* 145* 112* 131* 99  This patient is critically ill with multiple organ system failure; which, requires frequent high complexity decision making, assessment, support, evaluation, and titration of therapies. This was completed through the application of advanced monitoring technologies and extensive interpretation of multiple databases. During this encounter critical care time was devoted to patient care services described in this note for 33 minutes.  Garner Nash, DO [facility] 01/18/2021 8:45 AM

## 2021-01-19 DIAGNOSIS — G9340 Encephalopathy, unspecified: Secondary | ICD-10-CM

## 2021-01-19 DIAGNOSIS — I469 Cardiac arrest, cause unspecified: Secondary | ICD-10-CM | POA: Diagnosis not present

## 2021-01-19 DIAGNOSIS — J9690 Respiratory failure, unspecified, unspecified whether with hypoxia or hypercapnia: Secondary | ICD-10-CM

## 2021-01-19 DIAGNOSIS — T50901A Poisoning by unspecified drugs, medicaments and biological substances, accidental (unintentional), initial encounter: Secondary | ICD-10-CM

## 2021-01-19 LAB — BASIC METABOLIC PANEL
Anion gap: 7 (ref 5–15)
BUN: 13 mg/dL (ref 8–23)
CO2: 25 mmol/L (ref 22–32)
Calcium: 8.8 mg/dL — ABNORMAL LOW (ref 8.9–10.3)
Chloride: 108 mmol/L (ref 98–111)
Creatinine, Ser: 0.67 mg/dL (ref 0.44–1.00)
GFR, Estimated: >60 mL/min (ref >60)
Glucose, Bld: 160 mg/dL — ABNORMAL HIGH (ref 70–99)
Potassium: 3.6 mmol/L (ref 3.5–5.1)
Sodium: 140 mmol/L (ref 135–145)

## 2021-01-19 LAB — PHOSPHORUS: Phosphorus: 2.4 mg/dL — ABNORMAL LOW (ref 2.5–4.6)

## 2021-01-19 LAB — HEPATIC FUNCTION PANEL
ALT: 126 U/L — ABNORMAL HIGH (ref 0–44)
AST: 31 U/L (ref 15–41)
Albumin: 2.6 g/dL — ABNORMAL LOW (ref 3.5–5.0)
Alkaline Phosphatase: 51 U/L (ref 38–126)
Bilirubin, Direct: 0.1 mg/dL (ref 0.0–0.2)
Indirect Bilirubin: 0.4 mg/dL (ref 0.3–0.9)
Total Bilirubin: 0.5 mg/dL (ref 0.3–1.2)
Total Protein: 6.2 g/dL — ABNORMAL LOW (ref 6.5–8.1)

## 2021-01-19 LAB — GLUCOSE, CAPILLARY
Glucose-Capillary: 149 mg/dL — ABNORMAL HIGH (ref 70–99)
Glucose-Capillary: 117 mg/dL — ABNORMAL HIGH (ref 70–99)
Glucose-Capillary: 133 mg/dL — ABNORMAL HIGH (ref 70–99)
Glucose-Capillary: 131 mg/dL — ABNORMAL HIGH (ref 70–99)
Glucose-Capillary: 179 mg/dL — ABNORMAL HIGH (ref 70–99)
Glucose-Capillary: 117 mg/dL — ABNORMAL HIGH (ref 70–99)

## 2021-01-19 LAB — CBC
HCT: 32.8 % — ABNORMAL LOW (ref 36.0–46.0)
Hemoglobin: 10.9 g/dL — ABNORMAL LOW (ref 12.0–15.0)
MCH: 30.5 pg (ref 26.0–34.0)
MCHC: 33.2 g/dL (ref 30.0–36.0)
MCV: 91.9 fL (ref 80.0–100.0)
Platelets: 204 10*3/uL (ref 150–400)
RBC: 3.57 MIL/uL — ABNORMAL LOW (ref 3.87–5.11)
RDW: 13 % (ref 11.5–15.5)
WBC: 12.2 10*3/uL — ABNORMAL HIGH (ref 4.0–10.5)
nRBC: 0 % (ref 0.0–0.2)

## 2021-01-19 LAB — AMMONIA: Ammonia: 45 umol/L — ABNORMAL HIGH (ref 9–35)

## 2021-01-19 LAB — MAGNESIUM: Magnesium: 1.8 mg/dL (ref 1.7–2.4)

## 2021-01-19 MED ORDER — MAGNESIUM SULFATE 2 GM/50ML IV SOLN
2.0000 g | Freq: Once | INTRAVENOUS | Status: AC
  Administered 2021-01-19: 2 g via INTRAVENOUS
  Filled 2021-01-19: qty 50

## 2021-01-19 MED ORDER — VITAL HIGH PROTEIN PO LIQD: 1000.0000 mL | ORAL | Status: DC

## 2021-01-19 MED ORDER — SODIUM CHLORIDE 0.9 % IV SOLN: 2.0000 g | INTRAVENOUS | Status: DC

## 2021-01-19 MED ORDER — PROSOURCE TF PO LIQD
45.0000 mL | Freq: Two times a day (BID) | ORAL | Status: DC
  Administered 2021-01-19 (×2): 45 mL
  Filled 2021-01-19 (×2): qty 45

## 2021-01-19 MED ORDER — FUROSEMIDE 10 MG/ML IJ SOLN
40.0000 mg | Freq: Four times a day (QID) | INTRAMUSCULAR | Status: AC
Start: 2021-01-19 — End: 2021-01-19
  Administered 2021-01-19 (×2): 40 mg via INTRAVENOUS
  Filled 2021-01-19 (×2): qty 4

## 2021-01-19 MED ORDER — POTASSIUM PHOSPHATES 15 MMOLE/5ML IV SOLN
30.0000 mmol | Freq: Once | INTRAVENOUS | Status: AC
  Administered 2021-01-19: 30 mmol via INTRAVENOUS
  Filled 2021-01-19: qty 10

## 2021-01-19 MED ORDER — POTASSIUM CHLORIDE 20 MEQ PO PACK
40.0000 meq | PACK | Freq: Once | ORAL | Status: AC
  Administered 2021-01-19: 40 meq
  Filled 2021-01-19: qty 2

## 2021-01-19 NOTE — Progress Notes (Deleted)
MD notified of endo tool recommendation and transition orders, MD would like for pt to stay on insulin drip and endo tool at this time. CBGs continue to be monitored every 1hr. No ss of hypoglycemia noted. Family at bedside updated by MD.

## 2021-01-19 NOTE — Progress Notes (Signed)
NAME:  JURNI CESARO, MRN:  [id number], DOB:  1955/05/28, LOS: 4 ADMISSION DATE:  01/15/2021, CONSULTATION DATE: 01/15/2021 REFERRING MD: Emergency department physician, CHIEF COMPLAINT: PEA arrest  History of Present Illness:  65 year old with extensive past medical history as noted below who was found on her porch by a neighbor early a.m. 01/15/2021 unresponsive EMS arrived to find PEA arrest she required intubation multiple rounds of epinephrine chest compressions and coded again in the emergency department x2.  Pulmonary critical care asked to admit note she had drug paraphernalia around her this is presumed drug overdose she does have a history of DVT therefore we will work-up for possible DVT and PE.  FYI: Neighbor of patient reported to staff last evening that they have a new dealer and wasn't sure what was in the crack they smoked  Pertinent  Medical History   Past Medical History:  Diagnosis Date   Allergic rhinitis    Ambulates with cane    Full dentures    GERD (gastroesophageal reflux disease)    occasional , takes tums   Glaucoma, both eyes    Heart murmur    evalulated by cardiologist--- dr h. Johney Frame 01-07-2020 note in epic, echo done 01-31-2020 normal w/ no valve abnormalities   History of cellulitis    2006  LLE   History of chest pain    had cardiac cath 10-16-2004 (in epic)  showed normal coronaries that are tortous, patent renal arteries, normal LVF with ef 50-55%, noncardiac chest pain;  pt had ED visit 12-07-2019 negative work-up referred to cardiology, was seen by dr Johney Frame 01-07-2020 atypical cp / non-cardiac echo ordered / no further work-up   History of CVA (cerebrovascular accident) without residual deficits    11-29-2019  per pt happened in 2007 and has not had cva/ tia sympomts since   History of DVT of lower extremity    06-26-2020  per pt many years ago had RLE DVT completed blood thinner treatement stated not other blood clot until 07/ 2021 RUE with  superficial venous thrombosis assosiated with PICC line which was removed   History of osteomyelitis 10/2019   left foot  post op bunionectomy 06/ 2021   Hypertension    followed by pcp   IDA (iron deficiency anemia)    Migraines    11-29-2019 per pt previously had botox injection for prevention, last had 3 yrs ago,  now uses breathing , meditation, and minimizes triggers (bright light/ loud noise)   Neuropathy    feet   OA (osteoarthritis)    Prediabetes    Vascular dementia ([place])    per pcp h&p 06-18-2020 with mild cognitive impairment take, namenda with improvement   Vitamin D deficiency    Wears glasses    Significant Hospital Events: Including procedures, antibiotic start and stop dates in addition to other pertinent events   01/15/2021 found down in PEA, on epi/ NE, re-arrest x 2 in ER, starting zosyn for aspiration pna, off pressors overnight 10/7 intermittently agitated, on precedex, prn versed, LTM d/c'd- neg seizure, MRI brain  01/15/2021 CTH / cervical > no intracranial/ acute trauma findings to cervical, multilevel spinal degenerative changes greatest C5-6, dense consolidative changes at lung apices 01/15/2021 CTA chest/abd/ pelvis > neg dissection/ aneurysm, bilateral dependent lung consolidation, borderline to mild cardiomegaly, RCF venous catheter, minimally displaced anterior left 2nd rib fx 01/15/2021 lower extremity Doppler> negative 10/7 MRI brain >> non acute  10/6 SARS/ flu >neg 10/6 MRSA pcr > neg 10/6 Bcx2 >>  10/6 UC >>neg 10/7 trach asp >  10/6 zosyn  10/7 unasyn >  Interim History / Subjective:  Weaning 10/5 this AM.  On Fentanyl and Precedex, Fentanyl being weaned to hopefully wake up more, currently low effort on SBT  Objective   Blood pressure 106/63, pulse (!) 57, temperature 99.1 F (37.3 C), temperature source Oral, resp. rate (!) 21, weight 91.4 kg, SpO2 100 %.    Vent Mode: CPAP;PSV FiO2 (%):  [40 %] 40 % Set Rate:  [20 bmp] 20 bmp Vt Set:   [400 mL] 400 mL PEEP:  [5 cmH20] 5 cmH20 Pressure Support:  [5 cmH20-10 cmH20] 10 cmH20 Plateau Pressure:  [15 cmH20-22 cmH20] 15 cmH20   Intake/Output Summary (Last 24 hours) at 01/19/2021 0817 Last data filed at 01/19/2021 0500 Gross per 24 hour  Intake 1883.21 ml  Output 675 ml  Net 1208.21 ml    Filed Weights   01/17/21 0400 01/18/21 0411 01/19/21 0500  Weight: 90.1 kg 91.4 kg 91.4 kg   Examination: General:  critically ill female, intubated on life support  HEENT: [place]/AT, MMM. ETT in place Neuro: spontaneous movements but not following commands, eyes open and tracking CV: RRR PULM:  CTAB, BL vented breaths  GI: obese soft nt nd  Extremities: no edema  Skin: no rash    Assessment & Plan:   Acute encephalopathy, metabolic/ toxic and given multiple arrest, concern for anoxic injury.  Initial CT head and MRI brain neg.  EEG without seizues. Cocaine abuse  P:  Continue supportive care Still needs time to see what her brain does   PEA cardiac arrest- unclear etiology, possibly secondary to cocaine vs hypokalemia  - TTE with EF 60-65%, normal RV, valves ok, no wall motion abnormalities  - LE duplex neg and normal RV on TTE, very low risk of PE P:  Supportive care post arrest   Acute hypoxic respiratory insufficiency in the setting of cardiac arrest and aspiration pneumonia P:  Continue SBT Wean fentanyl  VAp ppx Continue Unasyn  Hypokalemia - resolved Hypophos - resolved P:  Replete as needed  Follow BMET   Transaminitis P:  - suspect from post arrest shock liver - follow   Hyperglycemia P: - continue ssi    Best Practice (right click and "Reselect all SmartList Selections" daily)   Diet/type: NPO; TF DVT prophylaxis: prophylactic heparin  GI prophylaxis: PPI Lines: N/A Foley:  Yes, and it is still needed Code Status:  full code Last date of multidisciplinary goals of care discussion:  daughter, Charna Archer, updated at bedside 10/8   CC  time: 30 min.   Montey Hora, [facility] For pager details, please see AMION or use Epic chat  After 1900, please call [facility] for cross coverage needs 01/19/2021, 8:27 AM

## 2021-01-19 NOTE — Progress Notes (Signed)
MD making rounds, pt has been placed on wake up assessments x1hr and for possible extubation, fentanyl drip cut down to 26mcg/hr, precedex to 0.28mcg/min. Pt very anxious, fighting the ventilator, tachypneic, unable to follow commands. MD orders sedation to be resumed, pt bolused. Will reassess for extubation in a.m

## 2021-01-20 ENCOUNTER — Inpatient Hospital Stay (HOSPITAL_COMMUNITY): Payer: Medicare Other

## 2021-01-20 DIAGNOSIS — I469 Cardiac arrest, cause unspecified: Secondary | ICD-10-CM | POA: Diagnosis not present

## 2021-01-20 LAB — BASIC METABOLIC PANEL
Anion gap: 8 (ref 5–15)
BUN: 14 mg/dL (ref 8–23)
CO2: 26 mmol/L (ref 22–32)
Calcium: 8.8 mg/dL — ABNORMAL LOW (ref 8.9–10.3)
Chloride: 104 mmol/L (ref 98–111)
Creatinine, Ser: 0.6 mg/dL (ref 0.44–1.00)
GFR, Estimated: >60 mL/min (ref >60)
Glucose, Bld: 128 mg/dL — ABNORMAL HIGH (ref 70–99)
Potassium: 4.2 mmol/L (ref 3.5–5.1)
Sodium: 138 mmol/L (ref 135–145)

## 2021-01-20 LAB — CBC
HCT: 30.1 % — ABNORMAL LOW (ref 36.0–46.0)
Hemoglobin: 9.9 g/dL — ABNORMAL LOW (ref 12.0–15.0)
MCH: 29.8 pg (ref 26.0–34.0)
MCHC: 32.9 g/dL (ref 30.0–36.0)
MCV: 90.7 fL (ref 80.0–100.0)
Platelets: 179 10*3/uL (ref 150–400)
RBC: 3.32 MIL/uL — ABNORMAL LOW (ref 3.87–5.11)
RDW: 12.9 % (ref 11.5–15.5)
WBC: 10.2 10*3/uL (ref 4.0–10.5)
nRBC: 0 % (ref 0.0–0.2)

## 2021-01-20 LAB — CULTURE, BLOOD (ROUTINE X 2)
Culture: NO GROWTH
Culture: NO GROWTH
Culture: NO GROWTH
Culture: NO GROWTH
Special Requests: ADEQUATE
Special Requests: ADEQUATE
Special Requests: ADEQUATE

## 2021-01-20 LAB — HEPATIC FUNCTION PANEL
ALT: 109 U/L — ABNORMAL HIGH (ref 0–44)
AST: 31 U/L (ref 15–41)
Albumin: 2.5 g/dL — ABNORMAL LOW (ref 3.5–5.0)
Alkaline Phosphatase: 49 U/L (ref 38–126)
Bilirubin, Direct: 0.1 mg/dL (ref 0.0–0.2)
Indirect Bilirubin: 0.5 mg/dL (ref 0.3–0.9)
Total Bilirubin: 0.6 mg/dL (ref 0.3–1.2)
Total Protein: 6 g/dL — ABNORMAL LOW (ref 6.5–8.1)

## 2021-01-20 LAB — GLUCOSE, CAPILLARY
Glucose-Capillary: 107 mg/dL — ABNORMAL HIGH (ref 70–99)
Glucose-Capillary: 114 mg/dL — ABNORMAL HIGH (ref 70–99)
Glucose-Capillary: 116 mg/dL — ABNORMAL HIGH (ref 70–99)
Glucose-Capillary: 126 mg/dL — ABNORMAL HIGH (ref 70–99)
Glucose-Capillary: 130 mg/dL — ABNORMAL HIGH (ref 70–99)
Glucose-Capillary: 154 mg/dL — ABNORMAL HIGH (ref 70–99)

## 2021-01-20 LAB — PHOSPHORUS: Phosphorus: 3.6 mg/dL (ref 2.5–4.6)

## 2021-01-20 LAB — MAGNESIUM: Magnesium: 1.9 mg/dL (ref 1.7–2.4)

## 2021-01-20 MED ORDER — FUROSEMIDE 10 MG/ML IJ SOLN
40.0000 mg | Freq: Four times a day (QID) | INTRAMUSCULAR | Status: AC
  Administered 2021-01-20 (×2): 40 mg via INTRAVENOUS
  Filled 2021-01-20 (×2): qty 4

## 2021-01-20 MED ORDER — MAGNESIUM SULFATE 2 GM/50ML IV SOLN
2.0000 g | Freq: Once | INTRAVENOUS | Status: AC
  Administered 2021-01-20: 2 g via INTRAVENOUS
  Filled 2021-01-20: qty 50

## 2021-01-20 MED ORDER — POTASSIUM CHLORIDE 20 MEQ PO PACK: 40.0000 meq | PACK | Freq: Once | ORAL | Status: DC

## 2021-01-20 MED ORDER — HALOPERIDOL LACTATE 5 MG/ML IJ SOLN
5.0000 mg | Freq: Once | INTRAMUSCULAR | Status: AC
  Administered 2021-01-20: 5 mg via INTRAVENOUS
  Filled 2021-01-20: qty 1

## 2021-01-20 NOTE — Progress Notes (Signed)
SLP Cancellation Note  Patient Details Name: Alexis Ball MRN: [id number] DOB: January 28, 1956   Cancelled treatment:       Reason Eval/Treat Not Completed: Medical issues which prohibited therapy. RN reports pt has been restless since extubation, necessitating Precedex and Haldol. Pt is currently inappropriate for BSE. Will continue efforts. Pt is currently NPO without nutritional source.  Alexis Ball, [facility], [employer] Speech Language Pathologist Office: [phone]  Alexis Ball 01/20/2021, 2:48 PM

## 2021-01-20 NOTE — Plan of Care (Signed)
  Problem: Activity: Goal: Ability to tolerate increased activity will improve Outcome: Progressing   Problem: Respiratory: Goal: Ability to maintain a clear airway and adequate ventilation will improve Outcome: Progressing   Problem: Role Relationship: Goal: Method of communication will improve Outcome: Progressing   Problem: Education: Goal: Knowledge of General Education information will improve Description: Including pain rating scale, medication(s)/side effects and non-pharmacologic comfort measures Outcome: Progressing   Problem: Health Behavior/Discharge Planning: Goal: Ability to manage health-related needs will improve Outcome: Progressing   Problem: Clinical Measurements: Goal: Ability to maintain clinical measurements within normal limits will improve Outcome: Progressing Goal: Will remain free from infection Outcome: Progressing Goal: Diagnostic test results will improve Outcome: Progressing Goal: Respiratory complications will improve Outcome: Progressing Goal: Cardiovascular complication will be avoided Outcome: Progressing   Problem: Activity: Goal: Risk for activity intolerance will decrease Outcome: Progressing   Problem: Nutrition: Goal: Adequate nutrition will be maintained Outcome: Progressing   Problem: Coping: Goal: Level of anxiety will decrease Outcome: Progressing   Problem: Pain Managment: Goal: General experience of comfort will improve Outcome: Progressing   Problem: Safety: Goal: Ability to remain free from injury will improve Outcome: Progressing   Problem: Skin Integrity: Goal: Risk for impaired skin integrity will decrease Outcome: Progressing

## 2021-01-20 NOTE — Progress Notes (Signed)
NAME:  Alexis Ball, MRN:  [id number], DOB:  01-09-1956, LOS: 5 ADMISSION DATE:  01/15/2021, CONSULTATION DATE: 01/15/2021 REFERRING MD: Emergency department physician, CHIEF COMPLAINT: PEA arrest  History of Present Illness:  65 year old with extensive past medical history as noted below who was found on her porch by a neighbor early a.m. 01/15/2021 unresponsive EMS arrived to find PEA arrest she required intubation multiple rounds of epinephrine chest compressions and coded again in the emergency department x2.  Pulmonary critical care asked to admit note she had drug paraphernalia around her this is presumed drug overdose she does have a history of DVT therefore we will work-up for possible DVT and PE.  FYI: Neighbor of patient reported to staff last evening that they have a new dealer and wasn't sure what was in the crack they smoked  Pertinent  Medical History   Past Medical History:  Diagnosis Date   Allergic rhinitis    Ambulates with cane    Full dentures    GERD (gastroesophageal reflux disease)    occasional , takes tums   Glaucoma, both eyes    Heart murmur    evalulated by cardiologist--- dr h. Johney Frame 01-07-2020 note in epic, echo done 01-31-2020 normal w/ no valve abnormalities   History of cellulitis    2006  LLE   History of chest pain    had cardiac cath 10-16-2004 (in epic)  showed normal coronaries that are tortous, patent renal arteries, normal LVF with ef 50-55%, noncardiac chest pain;  pt had ED visit 12-07-2019 negative work-up referred to cardiology, was seen by dr Johney Frame 01-07-2020 atypical cp / non-cardiac echo ordered / no further work-up   History of CVA (cerebrovascular accident) without residual deficits    11-29-2019  per pt happened in 2007 and has not had cva/ tia sympomts since   History of DVT of lower extremity    06-26-2020  per pt many years ago had RLE DVT completed blood thinner treatement stated not other blood clot until 07/ 2021 RUE with  superficial venous thrombosis assosiated with PICC line which was removed   History of osteomyelitis 10/2019   left foot  post op bunionectomy 06/ 2021   Hypertension    followed by pcp   IDA (iron deficiency anemia)    Migraines    11-29-2019 per pt previously had botox injection for prevention, last had 3 yrs ago,  now uses breathing , meditation, and minimizes triggers (bright light/ loud noise)   Neuropathy    feet   OA (osteoarthritis)    Prediabetes    Vascular dementia ([place])    per pcp h&p 06-18-2020 with mild cognitive impairment take, namenda with improvement   Vitamin D deficiency    Wears glasses    Significant Hospital Events: Including procedures, antibiotic start and stop dates in addition to other pertinent events   01/15/2021 found down in PEA, on epi/ NE, re-arrest x 2 in ER, starting zosyn for aspiration pna, off pressors overnight 10/7 intermittently agitated, on precedex, prn versed, LTM d/c'd- neg seizure, MRI brain  01/15/2021 CTH / cervical > no intracranial/ acute trauma findings to cervical, multilevel spinal degenerative changes greatest C5-6, dense consolidative changes at lung apices 01/15/2021 CTA chest/abd/ pelvis > neg dissection/ aneurysm, bilateral dependent lung consolidation, borderline to mild cardiomegaly, RCF venous catheter, minimally displaced anterior left 2nd rib fx 01/15/2021 lower extremity Doppler> negative 10/7 MRI brain >> non acute  10/6 SARS/ flu >neg 10/6 MRSA pcr > neg 10/6 Bcx2 >>  10/6 UC >>neg 10/7 trach asp >  10/6 zosyn  10/7 unasyn >  Interim History / Subjective:  Weaning at 5/5.  Very agitated and thrashing around bed.  Not following commands though. Family agreeable to trial of extubation.  Objective   Blood pressure 107/68, pulse (!) 50, temperature 99.2 F (37.3 C), temperature source Oral, resp. rate 20, weight 91.4 kg, SpO2 92 %.    Vent Mode: PRVC FiO2 (%):  [30 %-40 %] 30 % Set Rate:  [20 bmp] 20 bmp Vt Set:   [400 mL] 400 mL PEEP:  [5 cmH20] 5 cmH20 Plateau Pressure:  [18 cmH20-22 cmH20] 22 cmH20   Intake/Output Summary (Last 24 hours) at 01/20/2021 6606 Last data filed at 01/20/2021 0400 Gross per 24 hour  Intake 2253.95 ml  Output 3425 ml  Net -1171.05 ml    Filed Weights   01/17/21 0400 01/18/21 0411 01/19/21 0500  Weight: 90.1 kg 91.4 kg 91.4 kg   Examination: General:  critically ill female, intubated on life support, very agitated HEENT: [place]/AT, MMM. ETT in place Neuro: spontaneous movements all 4's but not following commands, eyes open and tracking CV: RRR PULM:  CTAB, BL vented breaths  GI: obese soft nt nd  Extremities: no edema  Skin: no rash    Assessment & Plan:   Acute encephalopathy, metabolic/ toxic and given multiple arrest, concern for anoxic injury.  Initial CT head and MRI brain neg.  EEG without seizues. Cocaine abuse  P:  Continue supportive care Will need some time to see her neuro recovery, hopefully after extubation, she will improve further Avoid any sedating or mind altering drugs  PEA cardiac arrest- unclear etiology, possibly secondary to cocaine vs hypokalemia  - TTE with EF 60-65%, normal RV, valves ok, no wall motion abnormalities  - LE duplex neg and normal RV on TTE, very low risk of PE P:  Supportive care post arrest   Acute hypoxic respiratory insufficiency in the setting of cardiac arrest and possible aspiration pneumonia (not convinced) P:  Extubate now Continue Unasyn  Transaminitis P:  - suspect from post arrest shock liver - follow intermittently  Hyperglycemia P: - continue ssi   Nutrition P: SLP eval after extubation  Best Practice (right click and "Reselect all SmartList Selections" daily)   Diet/type: NPO DVT prophylaxis: prophylactic heparin  GI prophylaxis: PPI Lines: N/A Foley:  Yes, and it is still needed Code Status:  full code Last date of multidisciplinary goals of care discussion:  daughter, Charna Archer, updated at bedside 10/8   CC time: 30 min.   Montey Hora, [place] [facility] For pager details, please see AMION or use Epic chat  After 1900, please call [facility] for cross coverage needs 01/20/2021, 8:33 AM

## 2021-01-20 NOTE — Plan of Care (Signed)
  Problem: Activity: Goal: Ability to tolerate increased activity will improve Outcome: Progressing   Problem: Respiratory: Goal: Ability to maintain a clear airway and adequate ventilation will improve Outcome: Progressing   Problem: Role Relationship: Goal: Method of communication will improve Outcome: Progressing   Problem: Education: Goal: Knowledge of General Education information will improve Description: Including pain rating scale, medication(s)/side effects and non-pharmacologic comfort measures Outcome: Progressing   Problem: Health Behavior/Discharge Planning: Goal: Ability to manage health-related needs will improve Outcome: Progressing   Problem: Clinical Measurements: Goal: Ability to maintain clinical measurements within normal limits will improve Outcome: Progressing Goal: Will remain free from infection Outcome: Progressing Goal: Diagnostic test results will improve Outcome: Progressing Goal: Respiratory complications will improve Outcome: Progressing Goal: Cardiovascular complication will be avoided Outcome: Progressing   Problem: Activity: Goal: Risk for activity intolerance will decrease Outcome: Progressing   Problem: Nutrition: Goal: Adequate nutrition will be maintained Outcome: Progressing   Problem: Coping: Goal: Level of anxiety will decrease Outcome: Progressing   Problem: Elimination: Goal: Will not experience complications related to bowel motility Outcome: Progressing Goal: Will not experience complications related to urinary retention Outcome: Progressing

## 2021-01-20 NOTE — Procedures (Signed)
Extubation Procedure Note  Patient Details:   Name: Alexis Ball DOB: Sep 04, 1955 MRN: [id number]   Airway Documentation:  Airway 7.5 mm (Active)  Secured at (cm) 22 cm 01/20/21 0420  Measured From Lips 01/20/21 [place] 01/20/21 0420  Secured By [employer] 01/20/21 0420  Tube Holder Repositioned Yes 01/20/21 0420  Prone position No 01/20/21 0420  Cuff Pressure (cm H2O) Green OR 18-26 [facility] 01/19/21 1929  Site Condition Dry 01/20/21 0420   Vent end date: (not recorded) Vent end time: (not recorded)   Evaluation  O2 sats: stable throughout Complications: No apparent complications Patient did tolerate procedure well. Bilateral Breath Sounds: Rhonchi, Diminished   No, patient confused.   Patient extubated at 0829 per MD order. RN at bedside. Patient placed on 4L nasal cannula. Vitals stable at this time. RT will continue to monitor.   Seward Speck 01/20/2021, 8:32 AM

## 2021-01-21 ENCOUNTER — Inpatient Hospital Stay (HOSPITAL_COMMUNITY): Payer: Medicare Other

## 2021-01-21 DIAGNOSIS — R410 Disorientation, unspecified: Secondary | ICD-10-CM

## 2021-01-21 DIAGNOSIS — I469 Cardiac arrest, cause unspecified: Secondary | ICD-10-CM | POA: Diagnosis not present

## 2021-01-21 DIAGNOSIS — Z515 Encounter for palliative care: Secondary | ICD-10-CM | POA: Diagnosis not present

## 2021-01-21 DIAGNOSIS — Z66 Do not resuscitate: Secondary | ICD-10-CM | POA: Diagnosis not present

## 2021-01-21 DIAGNOSIS — Z9289 Personal history of other medical treatment: Secondary | ICD-10-CM

## 2021-01-21 DIAGNOSIS — T405X1A Poisoning by cocaine, accidental (unintentional), initial encounter: Secondary | ICD-10-CM | POA: Diagnosis not present

## 2021-01-21 DIAGNOSIS — G9340 Encephalopathy, unspecified: Secondary | ICD-10-CM

## 2021-01-21 DIAGNOSIS — G8250 Quadriplegia, unspecified: Secondary | ICD-10-CM | POA: Diagnosis not present

## 2021-01-21 LAB — HEPATIC FUNCTION PANEL
ALT: 112 U/L — ABNORMAL HIGH (ref 0–44)
AST: 54 U/L — ABNORMAL HIGH (ref 15–41)
Albumin: 2.8 g/dL — ABNORMAL LOW (ref 3.5–5.0)
Alkaline Phosphatase: 60 U/L (ref 38–126)
Bilirubin, Direct: 0.1 mg/dL (ref 0.0–0.2)
Indirect Bilirubin: 0.8 mg/dL (ref 0.3–0.9)
Total Bilirubin: 0.9 mg/dL (ref 0.3–1.2)
Total Protein: 6.9 g/dL (ref 6.5–8.1)

## 2021-01-21 LAB — CBC
HCT: 38.9 % (ref 36.0–46.0)
Hemoglobin: 12.5 g/dL (ref 12.0–15.0)
MCH: 29.6 pg (ref 26.0–34.0)
MCHC: 32.1 g/dL (ref 30.0–36.0)
MCV: 92 fL (ref 80.0–100.0)
Platelets: 264 10*3/uL (ref 150–400)
RBC: 4.23 MIL/uL (ref 3.87–5.11)
RDW: 12.9 % (ref 11.5–15.5)
WBC: 13.6 10*3/uL — ABNORMAL HIGH (ref 4.0–10.5)
nRBC: 0 % (ref 0.0–0.2)

## 2021-01-21 LAB — BASIC METABOLIC PANEL
Anion gap: 12 (ref 5–15)
BUN: 13 mg/dL (ref 8–23)
CO2: 27 mmol/L (ref 22–32)
Calcium: 9.3 mg/dL (ref 8.9–10.3)
Chloride: 100 mmol/L (ref 98–111)
Creatinine, Ser: 0.68 mg/dL (ref 0.44–1.00)
GFR, Estimated: >60 mL/min (ref >60)
Glucose, Bld: 123 mg/dL — ABNORMAL HIGH (ref 70–99)
Potassium: 4.1 mmol/L (ref 3.5–5.1)
Sodium: 139 mmol/L (ref 135–145)

## 2021-01-21 LAB — MAGNESIUM: Magnesium: 2.1 mg/dL (ref 1.7–2.4)

## 2021-01-21 LAB — GLUCOSE, CAPILLARY
Glucose-Capillary: 101 mg/dL — ABNORMAL HIGH (ref 70–99)
Glucose-Capillary: 106 mg/dL — ABNORMAL HIGH (ref 70–99)
Glucose-Capillary: 122 mg/dL — ABNORMAL HIGH (ref 70–99)
Glucose-Capillary: 126 mg/dL — ABNORMAL HIGH (ref 70–99)
Glucose-Capillary: 128 mg/dL — ABNORMAL HIGH (ref 70–99)
Glucose-Capillary: 129 mg/dL — ABNORMAL HIGH (ref 70–99)

## 2021-01-21 LAB — PHOSPHORUS: Phosphorus: 3.3 mg/dL (ref 2.5–4.6)

## 2021-01-21 MED ORDER — HALOPERIDOL LACTATE 5 MG/ML IJ SOLN
1.0000 mg | INTRAMUSCULAR | Status: DC | PRN
  Administered 2021-01-22: 1 mg via INTRAVENOUS
  Filled 2021-01-21: qty 1

## 2021-01-21 MED ORDER — MEMANTINE HCL 10 MG PO TABS
10.0000 mg | ORAL_TABLET | Freq: Every day | ORAL | Status: DC
  Administered 2021-01-21 – 2021-01-28 (×8): 10 mg
  Filled 2021-01-21 (×8): qty 1

## 2021-01-21 MED ORDER — QUETIAPINE FUMARATE 100 MG PO TABS: 100.0000 mg | ORAL_TABLET | Freq: Every day | ORAL | Status: DC

## 2021-01-21 MED ORDER — AMLODIPINE BESYLATE 5 MG PO TABS
5.0000 mg | ORAL_TABLET | Freq: Once | ORAL | Status: AC
  Administered 2021-01-21: 5 mg
  Filled 2021-01-21: qty 1

## 2021-01-21 MED ORDER — HALOPERIDOL 1 MG PO TABS
1.0000 mg | ORAL_TABLET | ORAL | Status: DC | PRN
  Filled 2021-01-21: qty 1

## 2021-01-21 MED ORDER — LORAZEPAM 2 MG/ML IJ SOLN
1.0000 mg | Freq: Four times a day (QID) | INTRAMUSCULAR | Status: DC | PRN
  Administered 2021-01-22 – 2021-01-25 (×4): 1 mg via INTRAVENOUS
  Filled 2021-01-21 (×5): qty 1

## 2021-01-21 MED ORDER — HALOPERIDOL LACTATE 5 MG/ML IJ SOLN
1.0000 mg | INTRAMUSCULAR | Status: DC | PRN
Start: 2021-01-21 — End: 2021-01-21

## 2021-01-21 MED ORDER — OSMOLITE 1.2 CAL PO LIQD
1000.0000 mL | ORAL | Status: DC
  Administered 2021-01-21 – 2021-01-26 (×4): 1000 mL
  Filled 2021-01-21 (×15): qty 1000

## 2021-01-21 MED ORDER — ACETAMINOPHEN 160 MG/5ML PO SOLN
650.0000 mg | Freq: Three times a day (TID) | ORAL | Status: DC | PRN
  Filled 2021-01-21: qty 20.3

## 2021-01-21 MED ORDER — HYDRALAZINE HCL 25 MG PO TABS
25.0000 mg | ORAL_TABLET | Freq: Four times a day (QID) | ORAL | Status: DC | PRN
  Administered 2021-01-21 – 2021-01-22 (×2): 25 mg via ORAL
  Filled 2021-01-21: qty 1

## 2021-01-21 MED ORDER — CHLORHEXIDINE GLUCONATE 0.12 % MT SOLN
15.0000 mL | Freq: Two times a day (BID) | OROMUCOSAL | Status: DC
Start: 2021-01-22 — End: 2021-01-28
  Administered 2021-01-22 – 2021-01-28 (×13): 15 mL via OROMUCOSAL
  Filled 2021-01-21 (×12): qty 15

## 2021-01-21 MED ORDER — AMLODIPINE BESYLATE 10 MG PO TABS
10.0000 mg | ORAL_TABLET | Freq: Every day | ORAL | Status: DC
Start: 2021-01-22 — End: 2021-01-28
  Administered 2021-01-22 – 2021-01-27 (×6): 10 mg
  Filled 2021-01-21 (×6): qty 1

## 2021-01-21 MED ORDER — PROSOURCE TF PO LIQD
45.0000 mL | Freq: Every day | ORAL | Status: DC
  Administered 2021-01-21 – 2021-01-27 (×7): 45 mL
  Filled 2021-01-21 (×7): qty 45

## 2021-01-21 MED ORDER — QUETIAPINE FUMARATE 50 MG PO TABS
50.0000 mg | ORAL_TABLET | Freq: Two times a day (BID) | ORAL | Status: DC
  Administered 2021-01-21 (×2): 50 mg
  Filled 2021-01-21 (×2): qty 1

## 2021-01-21 MED ORDER — HYDRALAZINE HCL 25 MG PO TABS
25.0000 mg | ORAL_TABLET | Freq: Three times a day (TID) | ORAL | Status: DC
  Administered 2021-01-21 – 2021-01-26 (×14): 25 mg
  Filled 2021-01-21 (×16): qty 1

## 2021-01-21 MED ORDER — AMLODIPINE BESYLATE 5 MG PO TABS
5.0000 mg | ORAL_TABLET | Freq: Every day | ORAL | Status: DC
Start: 2021-01-21 — End: 2021-01-21
  Administered 2021-01-21: 5 mg via ORAL
  Filled 2021-01-21: qty 1

## 2021-01-21 MED ORDER — HYDROCHLOROTHIAZIDE 25 MG PO TABS
25.0000 mg | ORAL_TABLET | Freq: Every day | ORAL | Status: DC
Start: 2021-01-21 — End: 2021-01-22
  Administered 2021-01-21 – 2021-01-22 (×2): 25 mg via ORAL
  Filled 2021-01-21 (×2): qty 1

## 2021-01-21 MED ORDER — ORAL CARE MOUTH RINSE
15.0000 mL | Freq: Two times a day (BID) | OROMUCOSAL | Status: DC
  Administered 2021-01-21 – 2021-01-28 (×12): 15 mL via OROMUCOSAL

## 2021-01-21 NOTE — Progress Notes (Signed)
NAME:  Alexis Ball, MRN:  [id number], DOB:  18-Aug-1955, LOS: 6 ADMISSION DATE:  01/15/2021, CONSULTATION DATE: 01/15/2021 REFERRING MD: Emergency department physician, CHIEF COMPLAINT: PEA arrest  History of Present Illness:  65 year old with extensive past medical history as noted below who was found on her porch by a neighbor early a.m. 01/15/2021 unresponsive EMS arrived to find PEA arrest she required intubation multiple rounds of epinephrine chest compressions and coded again in the emergency department x2.  Pulmonary critical care asked to admit note she had drug paraphernalia around her this is presumed drug overdose she does have a history of DVT therefore we will work-up for possible DVT and PE.   Pertinent  Medical History   Past Medical History:  Diagnosis Date   Allergic rhinitis    Ambulates with cane    Full dentures    GERD (gastroesophageal reflux disease)    occasional , takes tums   Glaucoma, both eyes    Heart murmur    evalulated by cardiologist--- dr h. Johney Frame 01-07-2020 note in epic, echo done 01-31-2020 normal w/ no valve abnormalities   History of cellulitis    2006  LLE   History of chest pain    had cardiac cath 10-16-2004 (in epic)  showed normal coronaries that are tortous, patent renal arteries, normal LVF with ef 50-55%, noncardiac chest pain;  pt had ED visit 12-07-2019 negative work-up referred to cardiology, was seen by dr Johney Frame 01-07-2020 atypical cp / non-cardiac echo ordered / no further work-up   History of CVA (cerebrovascular accident) without residual deficits    11-29-2019  per pt happened in 2007 and has not had cva/ tia sympomts since   History of DVT of lower extremity    06-26-2020  per pt many years ago had RLE DVT completed blood thinner treatement stated not other blood clot until 07/ 2021 RUE with superficial venous thrombosis assosiated with PICC line which was removed   History of osteomyelitis 10/2019   left foot  post op  bunionectomy 06/ 2021   Hypertension    followed by pcp   IDA (iron deficiency anemia)    Migraines    11-29-2019 per pt previously had botox injection for prevention, last had 3 yrs ago,  now uses breathing , meditation, and minimizes triggers (bright light/ loud noise)   Neuropathy    feet   OA (osteoarthritis)    Prediabetes    Vascular dementia ([place])    per pcp h&p 06-18-2020 with mild cognitive impairment take, namenda with improvement   Vitamin D deficiency    Wears glasses    Significant Hospital Events: Including procedures, antibiotic start and stop dates in addition to other pertinent events   01/15/2021 found down in PEA, on epi/ NE, re-arrest x 2 in ER, starting zosyn for aspiration pna, off pressors overnight 10/7 intermittently agitated, on precedex, prn versed, LTM d/c'd- neg seizure, MRI brain 10/12 Extubated yesterday, agitated requiring Precedex overnight   01/15/2021 CTH / cervical > no intracranial/ acute trauma findings to cervical, multilevel spinal degenerative changes greatest C5-6, dense consolidative changes at lung apices 01/15/2021 CTA chest/abd/ pelvis > neg dissection/ aneurysm, bilateral dependent lung consolidation, borderline to mild cardiomegaly, RCF venous catheter, minimally displaced anterior left 2nd rib fx 01/15/2021 lower extremity Doppler> negative 10/7 MRI brain >> non acute  10/6 SARS/ flu >neg 10/6 MRSA pcr > neg 10/6 Bcx2 >> 10/6 UC >>neg 10/7 trach asp >  10/6 zosyn  10/7 unasyn >  Interim  History / Subjective:  Doing well post-extubation from respiratory perspective Agitation/sedation and delirium continue to be difficult to manage  Objective   Blood pressure (!) 148/66, pulse (!) 48, temperature 98.1 F (36.7 C), temperature source Axillary, resp. rate 15, weight 89.5 kg, SpO2 95 %.        Intake/Output Summary (Last 24 hours) at 01/21/2021 0814 Last data filed at 01/21/2021 0800 Gross per 24 hour  Intake 611.06 ml  Output  3605 ml  Net -2993.94 ml    Filed Weights   01/18/21 0411 01/19/21 0500 01/21/21 0500  Weight: 91.4 kg 91.4 kg 89.5 kg    General:  elderly F, well nourished, somnolent HEENT: MM pink/moist Neuro: sleeping, arousable and then falls quickly back to sleep  CV: s1s2 bradycardic, regular, no m/r/g PULM:  clear on nasal cannula without significant rhonchi or wheezing GI: soft, bsx4 active  Extremities: warm/dry, no edema  Skin: no rashes or lesions      Assessment & Plan:   Acute encephalopathy, metabolic/ toxic and given multiple arrest, concern for anoxic injury.  Initial CT head and MRI brain neg.  EEG without seizues. Cocaine abuse  P:  -Some issues with delirium and agitation postextubation, treated with Precedex and initially somnolent this morning.  However later awake and responding to family at the bedside -Plan for NG tube and wean IV sedation, start Seroquel -Start PT/OT    PEA cardiac arrest- unclear etiology, possibly secondary to cocaine vs hypokalemia  - TTE with EF 60-65%, normal RV, valves ok, no wall motion abnormalities  - LE duplex neg and normal RV on TTE, very low risk of PE P:  -Continue supportive care  Acute hypoxic respiratory insufficiency in the setting of cardiac arrest and possible aspiration pneumonia  P:  -Improving, now extubated on nasal cannula -Completed course of Unasyn on 10/10  Transaminitis P:  - suspect from post arrest shock liver -Continue to follow intermittently  Hyperglycemia P: - continue ssi   Nutrition P: -NG tube -SLP eval, was to agitated yesterday, attempt repeat hopefully today  Best Practice (right click and "Reselect all SmartList Selections" daily)   Diet/type: NPO, speech eval DVT prophylaxis: prophylactic heparin  GI prophylaxis: PPI Lines: N/A Foley:  Yes, and it is still needed Code Status:  full code Last date of multidisciplinary goals of care discussion:  daughter, Charna Archer, updated at  bedside 10/12   CRITICAL CARE Performed by: Otilio Carpen Erian Rosengren   Total critical care time: 33 minutes  Critical care time was exclusive of separately billable procedures and treating other patients.  Critical care was necessary to treat or prevent imminent or life-threatening deterioration.  Critical care was time spent personally by me on the following activities: development of treatment plan with patient and/or surrogate as well as nursing, discussions with consultants, evaluation of patient's response to treatment, examination of patient, obtaining history from patient or surrogate, ordering and performing treatments and interventions, ordering and review of laboratory studies, ordering and review of radiographic studies, pulse oximetry and re-evaluation of patient's condition.   Otilio Carpen Gloriajean Okun, PA-C [facility] See Amion for pager If no response to pager , please call [phone] until 7pm After 7:00 pm call Elink  [phone]

## 2021-01-21 NOTE — Progress Notes (Signed)
EEG complete - results pending 

## 2021-01-21 NOTE — Progress Notes (Signed)
Nutrition Follow-up  DOCUMENTATION CODES:   Not applicable  INTERVENTION:   Tube Feeding via Cortrak:  Osmolite 1.2 at 60 ml/hr Pro-Source TF 45 mL daily Provides 91 g of protein, 1768 kcals and 1166 mL of free water   Monitor for diet advancement  NUTRITION DIAGNOSIS:   Inadequate oral intake related to acute illness as evidenced by NPO status.  Being addressed via TF   GOAL:   Patient will meet greater than or equal to 90% of their needs  Progressing  MONITOR:   TF tolerance, Weight trends, Vent status  REASON FOR ASSESSMENT:   Ventilator Enteral/tube feeding initiation and management  ASSESSMENT:   .64 yo female admitted post cardiac arrest, respiratory failure requiring intubation. UDS cocaine +. PMH includes vascular dementia, Vit D and Iron def anemia, CVA, HTN, GERD  10/06 Admitted, intubated 10/07 TF initiated 10/11 Extubated 10/12 Cortrak placed  Pt currently on precedex; pt alert but confused, agitated at times. Moving around a lot in the bed  NPO, SLP following but not ready for eval Cortrak placed today, plan to resume TF  Current wt 89.5 kg; admit wt 89.5 kg  +liquid stool, not on scheduled bowel regimen, receiving antibiotics  Labs: CBGs 106-154 Meds: ss novolog, lasix   Diet Order:   Diet Order             Diet NPO time specified  Diet effective now                   EDUCATION NEEDS:   No education needs have been identified at this time  Skin:  Skin Assessment: Reviewed RN Assessment  Last BM:  10/12 via FMS  Height:   Ht Readings from Last 1 Encounters:  12/17/20 5\' 2"  (1.575 m)    Weight:   Wt Readings from Last 1 Encounters:  01/21/21 89.5 kg     BMI:  Body mass index is 36.09 kg/m.  Estimated Nutritional Needs:   Kcal:  1500-1700 kcals  Protein:  75-90 g  Fluid:  >/= 1.5 L   Kerman Passey MS, RDN, LDN, CNSC Registered Dietitian III Clinical Nutrition RD Pager and On-Call Pager Number Located  in [place]

## 2021-01-21 NOTE — Progress Notes (Signed)
SLP Cancellation Note  Patient Details Name: LEKIA NIER MRN: [id number] DOB: December 11, 1955   Cancelled treatment:       Reason Eval/Treat Not Completed: Patient not medically ready. RN reports pt continues to be poorly responsive, requires sedation due to agitation. Awaiting COrtrak this am. Will f/u for swallow eval when appropriate.    Benaiah Behan, Katherene Ponto 01/21/2021, 10:05 AM

## 2021-01-21 NOTE — Progress Notes (Signed)
eLink Physician-Brief Progress Note Patient Name: Alexis Ball DOB: 04/06/1956 MRN: [id number]   Date of Service  01/21/2021  HPI/Events of Note  Patient has elevated AST, and ALT RN asks you to stop the scheduled Tylenol  eICU Interventions  Scheduled Tylenol part of post arrest hypothermia protocol. Will discontinue Ordered Tylenol prn     Intervention Category Intermediate Interventions: Other: Minor Interventions: Routine modifications to care plan (e.g. PRN medications for pain, fever)  Alexis Ball 01/21/2021, 10:47 PM

## 2021-01-21 NOTE — Procedures (Signed)
Cortrak  Tube Type:  Cortrak - 43 inches Tube Location:  Left nare Initial Placement:  Stomach Secured by: Bridle [employer]ique Used to Measure Tube Placement:  Marking at nare/corner of mouth Cortrak Secured At:  70 cm  Cortrak Tube Team Note:  Consult received to place a Cortrak feeding tube.   X-ray is required, abdominal x-ray has been ordered by the Cortrak team. Please confirm tube placement before using the Cortrak tube.   If the tube becomes dislodged please keep the tube and contact the Cortrak team at [email or web address] (password [email or web address]) for replacement.  If after hours and replacement cannot be delayed, place a NG tube and confirm placement with an abdominal x-ray.    Jackson Fetters MS, RD, LDN Please refer to AMION for RD and/or RD on-call/weekend/after hours pager   

## 2021-01-21 NOTE — Plan of Care (Signed)
  Problem: Respiratory: Goal: Ability to maintain a clear airway and adequate ventilation will improve Outcome: Progressing   

## 2021-01-21 NOTE — Evaluation (Signed)
Physical Therapy Evaluation Patient Details Name: Alexis Ball MRN: [id number] DOB: Jun 04, 1955 Today's Date: 01/21/2021  History of Present Illness  Pt is a 65 y.o.F admitted to ICU 01/15/2021 who was found unresponsive on her porch with PEA. She required intubation, multiple rounds of epinephrine, chest compressions and coded again in the ED x 2. Pt intubated 10/6-10/11. Pt also with acute encephalopathy, concern for anoxic injury; initial CT head and MRI brain neg. Significant PMH: drug use, CVA without residual deficits, vascular dementia.  Clinical Impression  PTA, pt lives alone and is independent with mobility using a cane vs a walker. Pt presents with decreased functional mobility secondary to cognitive deficits, generalized weakness, poor sitting balance, decreased activity tolerance. Pt requiring two person total assist for bed mobility. Able to sit edge of bed ~10-15 minutes; she is visually tracking and following ~25 % of one step commands. SpO2 95% on RA, HR 47-50, BP 161/89. Will continue to assess and progress mobility as tolerated.      Recommendations for follow up therapy are one component of a multi-disciplinary discharge planning process, led by the attending physician.  Recommendations may be updated based on patient status, additional functional criteria and insurance authorization.  Follow Up Recommendations SNF;Supervision/Assistance - 24 hour    Equipment Recommendations  Other (comment) (TBA)    Recommendations for Other Services       Precautions / Restrictions Precautions Precautions: Fall;Other (comment) Precaution Comments: NGT, rectal tube, bilat mitts Restrictions Weight Bearing Restrictions: No      Mobility  Bed Mobility Overal bed mobility: Needs Assistance Bed Mobility: Supine to Sit;Sit to Supine     Supine to sit: Total assist;+2 for physical assistance Sit to supine: Total assist;+2 for physical assistance   General bed mobility  comments: No initiation noted by pt    Transfers                    Ambulation/Gait                Stairs            Wheelchair Mobility    Modified Rankin (Stroke Patients Only)       Balance Overall balance assessment: Needs assistance Sitting-balance support: Feet supported;No upper extremity supported Sitting balance-Leahy Scale: Poor Sitting balance - Comments: Varying levels of assist required - from minA to maxA, intermittent posterior lean and truncal/hip extension                                     Pertinent Vitals/Pain Pain Assessment: Faces Faces Pain Scale: No hurt    Home Living Family/patient expects to be discharged to:: Private residence Living Arrangements: Alone Available Help at Discharge: Family Type of Home: House Home Access: Stairs to enter       Home Equipment: Environmental consultant - 2 wheels;Cane - single point      Prior Function Level of Independence: Needs assistance   Gait / Transfers Assistance Needed: using cane vs walker  ADL's / Homemaking Assistance Needed: independent ADL's, does not drive        Hand Dominance        Extremity/Trunk Assessment   Upper Extremity Assessment Upper Extremity Assessment: Defer to OT evaluation    Lower Extremity Assessment Lower Extremity Assessment: Generalized weakness       Communication   Communication: Other (comment) (nonverbal)  Cognition Arousal/Alertness: Awake/alert Behavior During Therapy:  Agitated Overall Cognitive Status: Impaired/Different from baseline Area of Impairment: Following commands;Safety/judgement;Awareness                       Following Commands: Follows one step commands inconsistently Safety/Judgement: Decreased awareness of safety Awareness: Intellectual   General Comments: Pt has been trying to get out of bed per RN; she is alert and visually tracking. Follows ~25% of 1 step commands i.e. raise leg, kick leg out,  but does not participate in functional task of washing face      General Comments      Exercises General Exercises - Lower Extremity Long Arc Quad: Both;PROM;5 reps;Seated   Assessment/Plan    PT Assessment Patient needs continued PT services  PT Problem List Decreased strength;Decreased activity tolerance;Decreased balance;Decreased mobility;Decreased cognition;Decreased safety awareness       PT Treatment Interventions DME instruction;Gait training;Therapeutic activities;Functional mobility training;Therapeutic exercise;Balance training;Cognitive remediation;Patient/family education    PT Goals (Current goals can be found in the Care Plan section)  Acute Rehab PT Goals Patient Stated Goal: pt daughter in law would like for her to improve PT Goal Formulation: With patient/family Time For Goal Achievement: 02/04/21 Potential to Achieve Goals: Fair    Frequency Min 3X/week   Barriers to discharge        Co-evaluation               AM-PAC PT "6 Clicks" Mobility  Outcome Measure Help needed turning from your back to your side while in a flat bed without using bedrails?: Total Help needed moving from lying on your back to sitting on the side of a flat bed without using bedrails?: Total Help needed moving to and from a bed to a chair (including a wheelchair)?: Total Help needed standing up from a chair using your arms (e.g., wheelchair or bedside chair)?: Total Help needed to walk in hospital room?: Total Help needed climbing 3-5 steps with a railing? : Total 6 Click Score: 6    End of Session   Activity Tolerance: Patient tolerated treatment well Patient left: in bed;with call bell/phone within reach;with nursing/sitter in room;with family/visitor present;with restraints reapplied Nurse Communication: Mobility status PT Visit Diagnosis: Other abnormalities of gait and mobility (R26.89);Muscle weakness (generalized) (M62.81)    Time: 1210-1240 PT Time Calculation  (min) (ACUTE ONLY): 30 min   Charges:   PT Evaluation $PT Eval Moderate Complexity: 1 Mod PT Treatments $Therapeutic Activity: 8-22 mins        Wyona Almas, PT, DPT Acute Rehabilitation Services Pager [phone] Office [phone]   Deno Etienne 01/21/2021, 2:15 PM

## 2021-01-21 NOTE — Procedures (Signed)
Patient Name: Alexis Ball  MRN: [id number]  Epilepsy Attending: Lora Havens  Referring Physician/Provider: laura Gleason, NP Date:10/612/2022 Duration: 22.07 mins   Patient history: 65 year old female with polysubstance dependence was brought into the emergency department after she was found unresponsive and pulseless by her neighbor, EMS started CPR, ROSC was achieved and patient was transferred to emergency department, she underwent PEA arrest again, ROSC was achieved after 3 minutes of CPR.  EEG to evaluate for seizures.   Level of alertness: lethargic   AEDs during EEG study: None   Technical aspects: This EEG study was done with scalp electrodes positioned according to the 10-20 International system of electrode placement. Electrical activity was acquired at a sampling rate of 500Hz  and reviewed with a high frequency filter of 70Hz  and a low frequency filter of 1Hz . EEG data were recorded continuously and digitally stored.    Description:  EEG showed continuous generalized 3 to 5 Hz theta-delta slowing.  Hyperventilation and photic stimulation were not performed.     ABNORMALITY -Continuous slow, generalized   IMPRESSION: This study is suggestive of moderate to severe diffuse encephalopathy, nonspecific to etiology.  No seizures or epileptiform discharges were seen throughout the recording.   Marly Schuld Barbra Sarks

## 2021-01-21 NOTE — TOC Initial Note (Signed)
Transition of Care [facility]) - Initial/Assessment Note    Patient Details  Name: Alexis Ball MRN: [id number] Date of Birth: [birth year]  Transition of Care [facility]) CM/SW Contact:    Milas Gain, [place] Phone Number: 01/21/2021, 4:06 PM  Clinical Narrative:                  CSW received consult for possible SNF placement at time of discharge. Patient currently disoriented.CSW spoke with patients daughter regarding PT recommendation of SNF placement at time of discharge. Patients daughter reports patient comes from home alone. Patients daughter expressed understanding of PT recommendation and is agreeable to SNF placement for patient at time of discharge. Patient daughter gave CSW permission to fax out initial referral near the [place] area.CSW discussed insurance authorization process .Patients daughter reports that patient has received the COVID vaccines no booster. No further questions reported at this time. CSW to continue to follow and assist with discharge planning needs.   Expected Discharge Plan: Skilled Nursing Facility Barriers to Discharge: Continued Medical Work up   Patient Goals and CMS Choice   CMS Medicare.gov Compare Post Acute Care list provided to:: Patient Represenative (must comment) (Spoke with patients daughter [name]) Choice offered to / list presented to : Adult Children (patietns daughter [name])  Expected Discharge Plan and Services Expected Discharge Plan: [place] In-house Referral: Clinical Social Work     Living arrangements for the past 2 months: Single Family Home                                      Prior Living Arrangements/Services Living arrangements for the past 2 months: Single Family Home Lives with:: Self Patient language and need for interpreter reviewed:: Yes Do you feel safe going back to the place where you live?: No   SNF  Need for Family Participation in Patient Care: Yes (Comment) Care giver support  system in place?: Yes (comment)      Activities of Daily Living      Permission Sought/Granted Permission sought to share information with : Case Manager, Family Supports, Customer service manager Permission granted to share information with : No  Share Information with NAME: Patient not oriented spoke with patients daughter [name]  Permission granted to share info w AGENCY: Patient not oriented spoke with patients daughter Natisha/SNF  Permission granted to share info w Relationship: Patient not oriented spoke with patients daughter [name]  Permission granted to share info w Contact Information: Patient not oriented spoke with patients daughter [name] [phone]  Emotional Assessment       Orientation: :  (Disoriented x 4) Alcohol / Substance Use: Not Applicable Psych Involvement: No (comment)  Admission diagnosis:  Cardiac arrest [facility]) [I46.9] Patient Active Problem List   Diagnosis Date Noted   History of ETT    Delirium    Respiratory failure ([place])    Accidental drug overdose    Encephalopathy acute    Cardiac arrest ([place]) 01/15/2021   Osteomyelitis of toe of right foot ([place])    Wound dehiscence    PICC (peripherally inserted central catheter) in place 11/23/2019   Medication monitoring encounter 11/23/2019   Osteomyelitis of ankle or foot, acute, right ([place]) 11/22/2019   Superficial venous thrombosis of arm, right 11/02/2019   Abscess of bursa of left foot 10/14/2019   Foot abscess, left 10/14/2019   Wound infection    Unilateral primary osteoarthritis, left knee  04/27/2019   Chondromalacia patellae, left knee 07/02/2016   Arthritis of left knee 02/25/2016   Vaginitis and vulvovaginitis, unspecified 08/08/2013   Leiomyoma of uterus, unspecified 08/08/2013   Atypical chest pain 03/13/2013   Abnormal ECG 03/13/2013   Obesity, unspecified 03/13/2013   Hypertension    PCP:  Arthur Holms, NP Pharmacy:   [facility] [place], [place] -  [address] [name] [address] Phone: [phone] Fax: [phone]  [id number] - [address], [name] [address] Phone: [phone] Fax: [phone]  [id number] - [place], [place] [address] [name] [address] Phone: [phone] Fax: [phone]     Social Determinants of Health (SDOH) Interventions    Readmission Risk Interventions No flowsheet data found.

## 2021-01-22 DIAGNOSIS — Z66 Do not resuscitate: Secondary | ICD-10-CM | POA: Diagnosis not present

## 2021-01-22 DIAGNOSIS — T405X1A Poisoning by cocaine, accidental (unintentional), initial encounter: Secondary | ICD-10-CM | POA: Diagnosis not present

## 2021-01-22 DIAGNOSIS — G8250 Quadriplegia, unspecified: Secondary | ICD-10-CM | POA: Diagnosis not present

## 2021-01-22 DIAGNOSIS — I469 Cardiac arrest, cause unspecified: Secondary | ICD-10-CM

## 2021-01-22 DIAGNOSIS — Z515 Encounter for palliative care: Secondary | ICD-10-CM | POA: Diagnosis not present

## 2021-01-22 LAB — HEPATIC FUNCTION PANEL
ALT: 146 U/L — ABNORMAL HIGH (ref 0–44)
AST: 97 U/L — ABNORMAL HIGH (ref 15–41)
Albumin: 2.9 g/dL — ABNORMAL LOW (ref 3.5–5.0)
Alkaline Phosphatase: 65 U/L (ref 38–126)
Bilirubin, Direct: 0.2 mg/dL (ref 0.0–0.2)
Indirect Bilirubin: UNDETERMINED mg/dL
Total Bilirubin: UNDETERMINED mg/dL (ref 0.3–1.2)
Total Protein: 7.6 g/dL (ref 6.5–8.1)

## 2021-01-22 LAB — GLUCOSE, CAPILLARY
Glucose-Capillary: 128 mg/dL — ABNORMAL HIGH (ref 70–99)
Glucose-Capillary: 169 mg/dL — ABNORMAL HIGH (ref 70–99)
Glucose-Capillary: 118 mg/dL — ABNORMAL HIGH (ref 70–99)
Glucose-Capillary: 234 mg/dL — ABNORMAL HIGH (ref 70–99)
Glucose-Capillary: 181 mg/dL — ABNORMAL HIGH (ref 70–99)

## 2021-01-22 LAB — BASIC METABOLIC PANEL
Anion gap: 14 (ref 5–15)
BUN: 17 mg/dL (ref 8–23)
CO2: 22 mmol/L (ref 22–32)
Calcium: 9.5 mg/dL (ref 8.9–10.3)
Chloride: 101 mmol/L (ref 98–111)
Creatinine, Ser: 0.68 mg/dL (ref 0.44–1.00)
GFR, Estimated: >60 mL/min (ref >60)
Glucose, Bld: 119 mg/dL — ABNORMAL HIGH (ref 70–99)
Potassium: 3.9 mmol/L (ref 3.5–5.1)
Sodium: 137 mmol/L (ref 135–145)

## 2021-01-22 LAB — CBC
HCT: 43.3 % (ref 36.0–46.0)
Hemoglobin: 14.2 g/dL (ref 12.0–15.0)
MCH: 29.6 pg (ref 26.0–34.0)
MCHC: 32.8 g/dL (ref 30.0–36.0)
MCV: 90.2 fL (ref 80.0–100.0)
Platelets: 280 10*3/uL (ref 150–400)
RBC: 4.8 MIL/uL (ref 3.87–5.11)
RDW: 12.8 % (ref 11.5–15.5)
WBC: 13.8 10*3/uL — ABNORMAL HIGH (ref 4.0–10.5)
nRBC: 0 % (ref 0.0–0.2)

## 2021-01-22 LAB — PHOSPHORUS: Phosphorus: 3.6 mg/dL (ref 2.5–4.6)

## 2021-01-22 LAB — MAGNESIUM: Magnesium: UNDETERMINED mg/dL (ref 1.7–2.4)

## 2021-01-22 MED ORDER — HALOPERIDOL 5 MG PO TABS
5.0000 mg | ORAL_TABLET | ORAL | Status: DC | PRN
  Filled 2021-01-22: qty 1

## 2021-01-22 MED ORDER — HALOPERIDOL LACTATE 5 MG/ML IJ SOLN
5.0000 mg | INTRAMUSCULAR | Status: DC | PRN
  Administered 2021-01-22: 5 mg via INTRAVENOUS

## 2021-01-22 MED ORDER — LABETALOL HCL 5 MG/ML IV SOLN
5.0000 mg | INTRAVENOUS | Status: DC | PRN
Start: 2021-01-22 — End: 2021-01-22
  Administered 2021-01-22 (×2): 5 mg via INTRAVENOUS

## 2021-01-22 MED ORDER — OXYCODONE-ACETAMINOPHEN 5-325 MG PO TABS
1.0000 | ORAL_TABLET | ORAL | Status: DC | PRN
  Filled 2021-01-22: qty 1

## 2021-01-22 MED ORDER — CYCLOBENZAPRINE HCL 10 MG PO TABS
10.0000 mg | ORAL_TABLET | Freq: Two times a day (BID) | ORAL | Status: DC | PRN
  Administered 2021-01-22 – 2021-01-23 (×2): 10 mg via ORAL
  Filled 2021-01-22 (×4): qty 1

## 2021-01-22 MED ORDER — GABAPENTIN 300 MG PO CAPS
300.0000 mg | ORAL_CAPSULE | Freq: Three times a day (TID) | ORAL | Status: DC
  Administered 2021-01-22 – 2021-01-25 (×9): 300 mg
  Filled 2021-01-22 (×8): qty 1

## 2021-01-22 MED ORDER — HALOPERIDOL 1 MG PO TABS
1.0000 mg | ORAL_TABLET | ORAL | Status: DC | PRN
Start: 2021-01-22 — End: 2021-01-22
  Filled 2021-01-22: qty 1

## 2021-01-22 MED ORDER — HYDROCHLOROTHIAZIDE 25 MG PO TABS
25.0000 mg | ORAL_TABLET | Freq: Every day | ORAL | Status: DC
  Administered 2021-01-23 – 2021-01-26 (×4): 25 mg
  Filled 2021-01-22 (×4): qty 1

## 2021-01-22 MED ORDER — GERHARDT'S BUTT CREAM
TOPICAL_CREAM | Freq: Four times a day (QID) | CUTANEOUS | Status: DC | PRN
  Filled 2021-01-22: qty 1

## 2021-01-22 MED ORDER — QUETIAPINE FUMARATE 50 MG PO TABS
50.0000 mg | ORAL_TABLET | Freq: Every day | ORAL | Status: DC | PRN
  Administered 2021-01-22: 50 mg
  Filled 2021-01-22: qty 1

## 2021-01-22 MED ORDER — QUETIAPINE FUMARATE 50 MG PO TABS
150.0000 mg | ORAL_TABLET | Freq: Every day | ORAL | Status: DC
  Administered 2021-01-22 – 2021-01-28 (×7): 150 mg
  Filled 2021-01-22 (×7): qty 1

## 2021-01-22 MED ORDER — QUETIAPINE FUMARATE 50 MG PO TABS: 50.0000 mg | ORAL_TABLET | Freq: Every day | ORAL | Status: DC

## 2021-01-22 MED ORDER — LABETALOL HCL 5 MG/ML IV SOLN
5.0000 mg | INTRAVENOUS | Status: DC | PRN
  Filled 2021-01-22: qty 4

## 2021-01-22 MED ORDER — DIPHENHYDRAMINE HCL 50 MG/ML IJ SOLN: 25.0000 mg | Freq: Once | INTRAMUSCULAR | Status: DC | PRN

## 2021-01-22 MED ORDER — HALOPERIDOL LACTATE 5 MG/ML IJ SOLN
1.0000 mg | INTRAMUSCULAR | Status: DC | PRN
  Administered 2021-01-22: 1 mg via INTRAVENOUS
  Filled 2021-01-22 (×2): qty 1

## 2021-01-22 MED ORDER — LABETALOL HCL 5 MG/ML IV SOLN
5.0000 mg | INTRAVENOUS | Status: DC | PRN
Start: 2021-01-22 — End: 2021-01-28
  Filled 2021-01-22: qty 4

## 2021-01-22 MED ORDER — OXYCODONE-ACETAMINOPHEN 5-325 MG PO TABS
1.0000 | ORAL_TABLET | ORAL | Status: DC | PRN
  Administered 2021-01-22 – 2021-01-26 (×4): 1
  Filled 2021-01-22 (×3): qty 1

## 2021-01-22 MED ORDER — GABAPENTIN 300 MG PO CAPS
300.0000 mg | ORAL_CAPSULE | Freq: Three times a day (TID) | ORAL | Status: DC
  Filled 2021-01-22: qty 1

## 2021-01-22 MED ORDER — HYDRALAZINE HCL 25 MG PO TABS
25.0000 mg | ORAL_TABLET | Freq: Four times a day (QID) | ORAL | Status: DC | PRN
  Administered 2021-01-22: 25 mg via ORAL
  Filled 2021-01-22: qty 1

## 2021-01-22 MED ORDER — LORAZEPAM 2 MG/ML IJ SOLN: 2.0000 mg | Freq: Once | INTRAMUSCULAR | Status: DC | PRN

## 2021-01-22 NOTE — Progress Notes (Signed)
Pt increasingly agitated this afternoon, Haldol increased and was calmer at the time of my evaluation. Resume home oxycodone, Gabapentin and Flexeril and one time Ativan 2mg  and Benadryl 25mg  if becomes severely agitated again.

## 2021-01-22 NOTE — Progress Notes (Signed)
NAME:  Alexis Ball, MRN:  [id number], DOB:  [birth year], LOS: 7 ADMISSION DATE:  01/15/2021, CONSULTATION DATE: 01/15/2021 REFERRING MD: Emergency department physician, CHIEF COMPLAINT: PEA arrest  History of Present Illness:  65 year old with extensive past medical history as noted below who was found on her porch by a neighbor early a.m. 01/15/2021 unresponsive EMS arrived to find PEA arrest she required intubation multiple rounds of epinephrine chest compressions and coded again in the emergency department x2.  Pulmonary critical care asked to admit note she had drug paraphernalia around her this is presumed drug overdose she does have a history of DVT therefore we will work-up for possible DVT and PE.   Pertinent  Medical History   Past Medical History:  Diagnosis Date   Allergic rhinitis    Ambulates with cane    Full dentures    GERD (gastroesophageal reflux disease)    occasional , takes tums   Glaucoma, both eyes    Heart murmur    evalulated by cardiologist--- dr h. Johney Frame 01-07-2020 note in epic, echo done 01-31-2020 normal w/ no valve abnormalities   History of cellulitis    2006  LLE   History of chest pain    had cardiac cath 10-16-2004 (in epic)  showed normal coronaries that are tortous, patent renal arteries, normal LVF with ef 50-55%, noncardiac chest pain;  pt had ED visit 12-07-2019 negative work-up referred to cardiology, was seen by dr Johney Frame 01-07-2020 atypical cp / non-cardiac echo ordered / no further work-up   History of CVA (cerebrovascular accident) without residual deficits    11-29-2019  per pt happened in 2007 and has not had cva/ tia sympomts since   History of DVT of lower extremity    06-26-2020  per pt many years ago had RLE DVT completed blood thinner treatement stated not other blood clot until 07/ 2021 RUE with superficial venous thrombosis assosiated with PICC line which was removed   History of osteomyelitis 10/2019   left foot  post op  bunionectomy 06/ 2021   Hypertension    followed by pcp   IDA (iron deficiency anemia)    Migraines    11-29-2019 per pt previously had botox injection for prevention, last had 3 yrs ago,  now uses breathing , meditation, and minimizes triggers (bright light/ loud noise)   Neuropathy    feet   OA (osteoarthritis)    Prediabetes    Vascular dementia ([place])    per pcp h&p 06-18-2020 with mild cognitive impairment take, namenda with improvement   Vitamin D deficiency    Wears glasses    Significant Hospital Events: Including procedures, antibiotic start and stop dates in addition to other pertinent events   01/15/2021 found down in PEA, on epi/ NE, re-arrest x 2 in ER, starting zosyn for aspiration pna, off pressors overnight 10/7 intermittently agitated, on precedex, prn versed, LTM d/c'd- neg seizure, MRI brain 10/12 Extubated yesterday, agitated requiring Precedex overnight   01/15/2021 CTH / cervical > no intracranial/ acute trauma findings to cervical, multilevel spinal degenerative changes greatest C5-6, dense consolidative changes at lung apices 01/15/2021 CTA chest/abd/ pelvis > neg dissection/ aneurysm, bilateral dependent lung consolidation, borderline to mild cardiomegaly, RCF venous catheter, minimally displaced anterior left 2nd rib fx 01/15/2021 lower extremity Doppler> negative 10/7 MRI brain >> non acute  10/6 SARS/ flu >neg 10/6 MRSA pcr > neg 10/6 Bcx2 >>neg 10/6 UC >>neg 10/7 trach asp >klebsiella pna  10/6 zosyn  10/7 unasyn >10/10  Interim History / Subjective:  Stable and off Precedex this morning Nursing reports was awake most of the night, Ativan did not help but improved with Haldol.  Awake and calm this morning    Objective   Blood pressure (!) 165/76, pulse (!) 102, temperature 98.1 F (36.7 C), temperature source Axillary, resp. rate (!) 25, weight 87.1 kg, SpO2 92 %.        Intake/Output Summary (Last 24 hours) at 01/22/2021 0734 Last data filed at  01/22/2021 0600 Gross per 24 hour  Intake 1406.31 ml  Output 1300 ml  Net 106.31 ml    Filed Weights   01/19/21 0500 01/21/21 0500 01/22/21 0100  Weight: 91.4 kg 89.5 kg 87.1 kg    General: Well-nourished elderly female, laying in bed eyes open, minimally interactive HEENT: MM pink/moist, NG tube in place Neuro: Awake, calm, not following commands CV: s1s2 rrr, no m/r/g PULM: No distress on room air, no rhonchi or wheezing, no accessory muscle use GI: soft, bsx4 active  Extremities: warm/dry, 1+ edema  Skin: no rashes or lesions     Assessment & Plan:   Acute encephalopathy, metabolic/ toxic and given multiple arrest, concern for anoxic injury.  Initial CT head and MRI brain neg.  EEG without seizues. Cocaine abuse  P:  -Extubated, stable, awake all night per report, off Precedex.  Nursing reports responds better to Haldol than Ativan.  Increase nighttime Seroquel, continue Namenda, continue as needed Haldol.  Family reports that patient is often up at night at baseline -Stable for transfer out of ICU, continue delirium precautions    PEA cardiac arrest- unclear etiology, possibly secondary to cocaine vs hypokalemia  - TTE with EF 60-65%, normal RV, valves ok, no wall motion abnormalities  - LE duplex neg and normal RV on TTE, very low risk of PE P:  -Continue supportive care  Acute hypoxic respiratory insufficiency in the setting of cardiac arrest and possible aspiration pneumonia  P:  -Improving, now extubated on room air -Completed course of Unasyn on 10/10  Transaminitis P:  - suspect from post arrest shock liver -Continue to follow intermittently, AST 97, ALT 146 today  Hyperglycemia P: - continue ssi   Nutrition P: -NG tube and tube feeds -SLP eval, was to agitated yesterday, attempt repeat when patient's mental status more amenable  Best Practice (right click and "Reselect all SmartList Selections" daily)   Diet/type: tubefeeds, speech eval DVT  prophylaxis: prophylactic heparin  GI prophylaxis: PPI Lines: N/A Foley:  removal ordered  Code Status:  full code Last date of multidisciplinary goals of care discussion: Family at the bedside updated 10/13  CRITICAL CARE Performed by: Otilio Carpen Gloristine Turrubiates   Total critical care time: 35 minutes  Critical care time was exclusive of separately billable procedures and treating other patients.  Critical care was necessary to treat or prevent imminent or life-threatening deterioration.  Critical care was time spent personally by me on the following activities: development of treatment plan with patient and/or surrogate as well as nursing, discussions with consultants, evaluation of patient's response to treatment, examination of patient, obtaining history from patient or surrogate, ordering and performing treatments and interventions, ordering and review of laboratory studies, ordering and review of radiographic studies, pulse oximetry and re-evaluation of patient's condition.   Otilio Carpen Karmen Altamirano, PA-C [facility] See Amion for pager If no response to pager , please call [phone] until 7pm After 7:00 pm call [place]  [phone]

## 2021-01-22 NOTE — Progress Notes (Signed)
eLink Physician-Brief Progress Note Patient Name: Alexis Ball DOB: [birth year] MRN: [id number]   Date of Service  01/22/2021  HPI/Events of Note  Notified of SBP 170s despite prn hydralazine Also has scheduled amlodipine and hydralazine  eICU Interventions  Ordered prn labetalol for now        Judd Lien 01/22/2021, 4:34 AM

## 2021-01-22 NOTE — Evaluation (Signed)
Occupational Therapy Evaluation Patient Details Name: Alexis Ball MRN: [id number] DOB: 1955-08-26 Today's Date: 01/22/2021   History of Present Illness Pt is a 65 y.o.F admitted to ICU 01/15/2021 who was found unresponsive on her porch with PEA. She required intubation, multiple rounds of epinephrine, chest compressions and coded again in the ED x 2. Pt intubated 10/6-10/11. Pt also with acute encephalopathy, concern for anoxic injury; initial CT head and MRI brain neg. Significant PMH: drug use, CVA without residual deficits, vascular dementia.   Clinical Impression   Patient admitted for the diagnosis above.  RN cleared for sitting edge of bed this date.  Initially patient was very restless, but calmed and began closing eyes and sleeping off an on.  Patient able to sit edge of bed for about 10 minutes with varying support depending on LOA.  Attempted AROM and seated grooming with no follow through.  Patient returned to supine with +2 from nursing.  Patient rolled side to side for linen changes Dep +1 and repositioned in bed with +2 Dep.  OT to continue efforts in the acute setting to maximize functional status, but SNF is recommended for post acute rehab to assist with eventual transition home.        Recommendations for follow up therapy are one component of a multi-disciplinary discharge planning process, led by the attending physician.  Recommendations may be updated based on patient status, additional functional criteria and insurance authorization.   Follow Up Recommendations  SNF    Equipment Recommendations  Wheelchair cushion (measurements OT);Wheelchair (measurements OT)    Recommendations for Other Services       Precautions / Restrictions Precautions Precautions: Fall;Other (comment) Precaution Comments: NGT, rectal tube, bilat mitts Restrictions Weight Bearing Restrictions: No      Mobility Bed Mobility Overal bed mobility: Needs Assistance Bed Mobility: Supine  to Sit;Sit to Supine     Supine to sit: Total assist;+2 for physical assistance     General bed mobility comments: decreased LOA    Transfers                      Balance Overall balance assessment: Needs assistance Sitting-balance support: Feet unsupported;No upper extremity supported Sitting balance-Leahy Scale: Zero Sitting balance - Comments: no righting reactions due to decreased LOA Postural control: Posterior lean;Right lateral lean;Left lateral lean                                 ADL either performed or assessed with clinical judgement   ADL                                         General ADL Comments: unable to assess due to poor LOA     Vision Patient Visual Report: No change from baseline       Perception     Praxis      Pertinent Vitals/Pain Pain Assessment: Faces Faces Pain Scale: No hurt Pain Intervention(s): Monitored during session     Hand Dominance Right   Extremity/Trunk Assessment Upper Extremity Assessment Upper Extremity Assessment: Difficult to assess due to impaired cognition   Lower Extremity Assessment Lower Extremity Assessment: Defer to PT evaluation   Cervical / Trunk Assessment Cervical / Trunk Assessment: Kyphotic   Communication Communication Communication: Other (comment)   Cognition Arousal/Alertness: Lethargic Behavior  During Therapy: Flat affect Overall Cognitive Status: Impaired/Different from baseline                                 General Comments: DIL states patient is taking meds for dementia.  Decreased ST memort, declines to problem solving and complex thought.  Increased assist from family.   General Comments       Exercises     Shoulder Instructions      Home Living Family/patient expects to be discharged to:: Private residence Living Arrangements: Alone Available Help at Discharge: Family;Available PRN/intermittently Type of Home: House Home  Access: Stairs to enter     Home Layout: One level     Bathroom Shower/Tub: Teacher, early years/pre: Standard     Home Equipment: Environmental consultant - 2 wheels;Cane - single point;Grab bars - tub/shower;Toilet riser;Shower seat      Lives With: Alone    Prior Functioning/Environment Level of Independence: Needs assistance  Gait / Transfers Assistance Needed: using a 2WRW ADL's / Homemaking Assistance Needed: DIL states family has been assisting with med, meals, home management, bills, supervision with bathing and assist with dressing due to cognitive decline.  patient does not drive any more            OT Problem List: Impaired balance (sitting and/or standing)      OT Treatment/Interventions: Self-care/ADL training;Therapeutic exercise;Therapeutic activities;Cognitive remediation/compensation;Patient/family education;DME and/or AE instruction;Balance training    OT Goals(Current goals can be found in the care plan section) Acute Rehab OT Goals Patient Stated Goal: Family hoping for increased LOA and ability to participate OT Goal Formulation: Patient unable to participate in goal setting Time For Goal Achievement: 02/05/21 Potential to Achieve Goals: Good ADL Goals Pt Will Perform Grooming: with min assist;bed level Additional ADL Goal #1: Patient will roll side to side with Min A to increase OOB independence with toileting. Additional ADL Goal #2: Patient will sit EOB unsupported for up to 5 min to increase independence with toileting.  OT Frequency: Min 2X/week   Barriers to D/C:    none noted       Co-evaluation              AM-PAC OT "6 Clicks" Daily Activity     Outcome Measure Help from another person eating meals?: Total Help from another person taking care of personal grooming?: Total Help from another person toileting, which includes using toliet, bedpan, or urinal?: Total Help from another person bathing (including washing, rinsing, drying)?:  Total Help from another person to put on and taking off regular upper body clothing?: Total Help from another person to put on and taking off regular lower body clothing?: Total 6 Click Score: 6   End of Session    Activity Tolerance: Patient limited by lethargy Patient left: in bed;with call bell/phone within reach;with nursing/sitter in room;with family/visitor present  OT Visit Diagnosis: Muscle weakness (generalized) (M62.81)                Time: 1448-1510 OT Time Calculation (min): 22 min Charges:  OT General Charges $OT Visit: 1 Visit OT Evaluation $OT Eval Moderate Complexity: 1 Mod  01/22/2021  RP, OTR/L  Acute Rehabilitation Services  Office:  [phone]   Metta Clines 01/22/2021, 4:02 PM

## 2021-01-22 NOTE — Evaluation (Signed)
Speech Language Pathology Evaluation Patient Details Name: Alexis Ball MRN: [id number] DOB: [birth year] Today's Date: 01/22/2021 Time: 1040-1100 SLP Time Calculation (min) (ACUTE ONLY): 20 min  Problem List:  Patient Active Problem List   Diagnosis Date Noted   History of ETT    Delirium    Respiratory failure ([place])    Accidental drug overdose    Encephalopathy acute    Cardiac arrest ([place]) 01/15/2021   Osteomyelitis of toe of right foot (HCC)    Wound dehiscence    PICC (peripherally inserted central catheter) in place 11/23/2019   Medication monitoring encounter 11/23/2019   Osteomyelitis of ankle or foot, acute, right ([place]) 11/22/2019   Superficial venous thrombosis of arm, right 11/02/2019   Abscess of bursa of left foot 10/14/2019   Foot abscess, left 10/14/2019   Wound infection    Unilateral primary osteoarthritis, left knee 04/27/2019   Chondromalacia patellae, left knee 07/02/2016   Arthritis of left knee 02/25/2016   Vaginitis and vulvovaginitis, unspecified 08/08/2013   Leiomyoma of uterus, unspecified 08/08/2013   Atypical chest pain 03/13/2013   Abnormal ECG 03/13/2013   Obesity, unspecified 03/13/2013   Hypertension    Past Medical History:  Past Medical History:  Diagnosis Date   Allergic rhinitis    Ambulates with cane    Full dentures    GERD (gastroesophageal reflux disease)    occasional , takes tums   Glaucoma, both eyes    Heart murmur    evalulated by cardiologist--- dr h. Johney Frame 01-07-2020 note in epic, echo done 01-31-2020 normal w/ no valve abnormalities   History of cellulitis    2006  LLE   History of chest pain    had cardiac cath 10-16-2004 (in epic)  showed normal coronaries that are tortous, patent renal arteries, normal LVF with ef 50-55%, noncardiac chest pain;  pt had ED visit 12-07-2019 negative work-up referred to cardiology, was seen by dr Johney Frame 01-07-2020 atypical cp / non-cardiac echo ordered / no further work-up    History of CVA (cerebrovascular accident) without residual deficits    11-29-2019  per pt happened in 2007 and has not had cva/ tia sympomts since   History of DVT of lower extremity    06-26-2020  per pt many years ago had RLE DVT completed blood thinner treatement stated not other blood clot until 07/ 2021 RUE with superficial venous thrombosis assosiated with PICC line which was removed   History of osteomyelitis 10/2019   left foot  post op bunionectomy 06/ 2021   Hypertension    followed by pcp   IDA (iron deficiency anemia)    Migraines    11-29-2019 per pt previously had botox injection for prevention, last had 3 yrs ago,  now uses breathing , meditation, and minimizes triggers (bright light/ loud noise)   Neuropathy    feet   OA (osteoarthritis)    Prediabetes    Vascular dementia ([place])    per pcp h&p 06-18-2020 with mild cognitive impairment take, namenda with improvement   Vitamin D deficiency    Wears glasses    Past Surgical History:  Past Surgical History:  Procedure Laterality Date   BONE BIOPSY Left 10/14/2019   Procedure: BONE BIOPSY PROXIMAL PHALANX;  Surgeon: Evelina Bucy, DPM;  Location: [place];  Service: Podiatry;  Laterality: Left;   BONE BIOPSY Left 04-30-2020  _0    x2  on left foot   BONE BIOPSY Left 07/02/2020   Procedure: BONE BIOPSY - FROZEN SECTION;  Surgeon: Evelina Bucy, DPM;  Location: [facility];  Service: Podiatry;  Laterality: Left;   BUNIONECTOMY Left 10-05-2019  dr Milinda Pointer _0    great toe   CARDIAC CATHETERIZATION  10-16-2004  dr Einar Gip   normal coronaries (tortuous), normal LVF   CATARACT EXTRACTION W/ INTRAOCULAR LENS  IMPLANT, BILATERAL  2013   COLONOSCOPY  last one 04-23-2009  dr d. Sharlett Iles   FOOT SURGERY Right unsure date   bunionectomy and hammertoe   HARDWARE REMOVAL Right 06/ 2015  _1    right foot s/p bunionectomy/ hammertoe conrrection   HARDWARE REMOVAL Left 12/05/2019   Procedure: HARDWARE REMOVAL ,WOUND  CLOSURE OF FIRST TOE;  Surgeon: Evelina Bucy, DPM;  Location: [place];  Service: Podiatry;  Laterality: Left;   IMPLANT OF A SILASTIC METATARSAL PHALANGE JOINT Left 07/02/2020   Procedure: IMPLANT OF A SILASTIC METATARSAL PHALANGE JOINT WITH REMOVAL OF ANTIBIOTIC SPACER;CAPSULAR REPAIR WITH TISSUE SUBSTITUTE;  Surgeon: Evelina Bucy, DPM;  Location: [place];  Service: Podiatry;  Laterality: Left;   IRRIGATION AND DEBRIDEMENT FOOT Left 10/14/2019   Procedure: INCISION AND DRAINAGE FOR LEFT FOOT INFECTION, COMPLEX;  Surgeon: Evelina Bucy, DPM;  Location: [place];  Service: Podiatry;  Laterality: Left;   IRRIGATION AND DEBRIDEMENT FOOT Left 10/17/2019   Procedure: Debridement and Irrigation of Left Foot; Removal of Silicone Implant; Insertion of Antibiotic Spacer; Application of External Fixator; Possible Application of Wound VAC;  Surgeon: Evelina Bucy, DPM;  Location: [place];  Service: Podiatry;  Laterality: Left;  Pre-op Block POP/SAPH; MAC   KNEE ARTHROSCOPY Left 2018   ORIF CALCANEOUS FRACTURE Left 10/17/2019   Procedure: Application External Fixation;  Surgeon: Evelina Bucy, DPM;  Location: [place];  Service: Podiatry;  Laterality: Left;   HPI:  Pt is a 65 y.o.F admitted to ICU 01/15/2021 who was found unresponsive on her porch with PEA. She required intubation, multiple rounds of epinephrine, chest compressions and coded again in the ED x 2. Pt intubated 10/6-10/11. Pt also with acute encephalopathy, concern for anoxic injury; initial CT head and MRI brain neg. Significant PMH: recent cocaine use, CVA without residual deficits, vascular dementia.   Assessment / Plan / Recommendation Clinical Impression  Pt demonstrates severe cognitive linguistic impairment following PEA arrest with potential anoxia. Pt alert today, will focus gaze on speaker (more on left than right), follow 75% of one step oral motor commands, 25% of LUE commands and 0 RUE commands.  Pt demonstates some auditory comprehension with these tasks, but cannot give a consistent Y/N response at this time. Appears to have poor neck control for accurate movement for nod/shake. Pt does not phonate or articulate during session despite efforts. There is potential for a general left hemisphere disorder with right inattention and expressive aphasia observed. Will f/u to faciliate cognitive linguitic improvement.    SLP Assessment  SLP Recommendation/Assessment: Patient needs continued Speech Lanaguage Pathology Services SLP Visit Diagnosis: Dysphagia, unspecified (R13.10)    Recommendations for follow up therapy are one component of a multi-disciplinary discharge planning process, led by the attending physician.  Recommendations may be updated based on patient status, additional functional criteria and insurance authorization.    Follow Up Recommendations  Skilled Nursing facility    Frequency and Duration min 2x/week  2 weeks      SLP Evaluation Cognition  Overall Cognitive Status: Impaired/Different from baseline Arousal/Alertness: Awake/alert Orientation Level: Other (comment) (unable to give a consistent Y/N) Attention: Focused Focused Attention: Impaired  Focused Attention Impairment: Verbal basic;Functional basic       Comprehension  Auditory Comprehension Overall Auditory Comprehension: Impaired Yes/No Questions: Impaired Basic Biographical Questions: 0-25% accurate Commands: Impaired One Step Basic Commands: 50-74% accurate Visual Recognition/Discrimination Discrimination: Not tested Reading Comprehension Reading Status: Not tested    Expression Verbal Expression Overall Verbal Expression: Other (comment) (no verbalizations) Initiation: Impaired Automatic Speech:  (unable)   Oral / Motor  Oral Motor/Sensory Function Overall Oral Motor/Sensory Function: Within functional limits Motor Speech Overall Motor Speech: Impaired   GO                   Herbie Baltimore, MA CCC-SLP  Acute Rehabilitation Services Office [phone]  Lynann Beaver 01/22/2021, 2:42 PM

## 2021-01-22 NOTE — Evaluation (Signed)
Clinical/Bedside Swallow Evaluation Patient Details  Name: Alexis Ball MRN: [id number] Date of Birth: Sep 07, 1955  Today's Date: 01/22/2021 Time: SLP Start Time (ACUTE ONLY): 1000 SLP Stop Time (ACUTE ONLY): 1100 SLP Time Calculation (min) (ACUTE ONLY): 20 min  Past Medical History:  Past Medical History:  Diagnosis Date   Allergic rhinitis    Ambulates with cane    Full dentures    GERD (gastroesophageal reflux disease)    occasional , takes tums   Glaucoma, both eyes    Heart murmur    evalulated by cardiologist--- dr h. Johney Frame 01-07-2020 note in epic, echo done 01-31-2020 normal w/ no valve abnormalities   History of cellulitis    2006  LLE   History of chest pain    had cardiac cath 10-16-2004 (in epic)  showed normal coronaries that are tortous, patent renal arteries, normal LVF with ef 50-55%, noncardiac chest pain;  pt had ED visit 12-07-2019 negative work-up referred to cardiology, was seen by dr Johney Frame 01-07-2020 atypical cp / non-cardiac echo ordered / no further work-up   History of CVA (cerebrovascular accident) without residual deficits    11-29-2019  per pt happened in 2007 and has not had cva/ tia sympomts since   History of DVT of lower extremity    06-26-2020  per pt many years ago had RLE DVT completed blood thinner treatement stated not other blood clot until 07/ 2021 RUE with superficial venous thrombosis assosiated with PICC line which was removed   History of osteomyelitis 10/2019   left foot  post op bunionectomy 06/ 2021   Hypertension    followed by pcp   IDA (iron deficiency anemia)    Migraines    11-29-2019 per pt previously had botox injection for prevention, last had 3 yrs ago,  now uses breathing , meditation, and minimizes triggers (bright light/ loud noise)   Neuropathy    feet   OA (osteoarthritis)    Prediabetes    Vascular dementia ([place])    per pcp h&p 06-18-2020 with mild cognitive impairment take, namenda with improvement    Vitamin D deficiency    Wears glasses    Past Surgical History:  Past Surgical History:  Procedure Laterality Date   BONE BIOPSY Left 10/14/2019   Procedure: BONE BIOPSY PROXIMAL PHALANX;  Surgeon: Evelina Bucy, DPM;  Location: [place];  Service: Podiatry;  Laterality: Left;   BONE BIOPSY Left 04-30-2020  _0    x2  on left foot   BONE BIOPSY Left 07/02/2020   Procedure: BONE BIOPSY - FROZEN SECTION;  Surgeon: Evelina Bucy, DPM;  Location: [place];  Service: Podiatry;  Laterality: Left;   BUNIONECTOMY Left 10-05-2019  dr Milinda Pointer _1    great toe   CARDIAC CATHETERIZATION  10-16-2004  dr Einar Gip   normal coronaries (tortuous), normal LVF   CATARACT EXTRACTION W/ INTRAOCULAR LENS  IMPLANT, BILATERAL  2013   COLONOSCOPY  last one 04-23-2009  dr d. Sharlett Iles   FOOT SURGERY Right unsure date   bunionectomy and hammertoe   HARDWARE REMOVAL Right 06/ 2015  _2    right foot s/p bunionectomy/ hammertoe conrrection   HARDWARE REMOVAL Left 12/05/2019   Procedure: HARDWARE REMOVAL ,WOUND CLOSURE OF FIRST TOE;  Surgeon: Evelina Bucy, DPM;  Location: [place];  Service: Podiatry;  Laterality: Left;   IMPLANT OF A SILASTIC METATARSAL PHALANGE JOINT Left 07/02/2020   Procedure: IMPLANT OF A SILASTIC METATARSAL PHALANGE JOINT WITH REMOVAL OF ANTIBIOTIC SPACER;CAPSULAR REPAIR  WITH TISSUE SUBSTITUTE;  Surgeon: Evelina Bucy, DPM;  Location: [facility];  Service: Podiatry;  Laterality: Left;   IRRIGATION AND DEBRIDEMENT FOOT Left 10/14/2019   Procedure: INCISION AND DRAINAGE FOR LEFT FOOT INFECTION, COMPLEX;  Surgeon: Evelina Bucy, DPM;  Location: [place];  Service: Podiatry;  Laterality: Left;   IRRIGATION AND DEBRIDEMENT FOOT Left 10/17/2019   Procedure: Debridement and Irrigation of Left Foot; Removal of Silicone Implant; Insertion of Antibiotic Spacer; Application of External Fixator; Possible Application of Wound VAC;  Surgeon: Evelina Bucy,  DPM;  Location: [place];  Service: Podiatry;  Laterality: Left;  Pre-op Block POP/SAPH; MAC   KNEE ARTHROSCOPY Left 2018   ORIF CALCANEOUS FRACTURE Left 10/17/2019   Procedure: Application External Fixation;  Surgeon: Evelina Bucy, DPM;  Location: [place];  Service: Podiatry;  Laterality: Left;   HPI:  Pt is a 65 y.o.F admitted to ICU 01/15/2021 who was found unresponsive on her porch with PEA. She required intubation, multiple rounds of epinephrine, chest compressions and coded again in the ED x 2. Pt intubated 10/6-10/11. Pt also with acute encephalopathy, concern for anoxic injury; initial CT head and MRI brain neg. Significant PMH: recent cocaine use, CVA without residual deficits, vascular dementia.    Assessment / Plan / Recommendation  Clinical Impression  Pt demonstrates cognitive impairment impacting ability to swallow. She is alert, visually tracks PO, opens her mouth in anticipation, accepts spoon and demonstrates good labial clousre. Pt then holds and thrusts bolus, spitting out water and puree. One swallow of secretions observed. Suspect in tact swallow neuromusculature, but pt does not accept PO due to cognitive impairment. Will f/u for further trials. SLP Visit Diagnosis: Dysphagia, unspecified (R13.10)    Aspiration Risk  Risk for inadequate nutrition/hydration    Diet Recommendation NPO        Other  Recommendations Oral Care Recommendations: Oral care QID    Recommendations for follow up therapy are one component of a multi-disciplinary discharge planning process, led by the attending physician.  Recommendations may be updated based on patient status, additional functional criteria and insurance authorization.  Follow up Recommendations Skilled Nursing facility      Frequency and Duration min 2x/week          Prognosis Prognosis for Safe Diet Advancement: Good Barriers to Reach Goals: Cognitive deficits      Swallow Study   General HPI: Pt is a 65 y.o.F admitted  to ICU 01/15/2021 who was found unresponsive on her porch with PEA. She required intubation, multiple rounds of epinephrine, chest compressions and coded again in the ED x 2. Pt intubated 10/6-10/11. Pt also with acute encephalopathy, concern for anoxic injury; initial CT head and MRI brain neg. Significant PMH: recent cocaine use, CVA without residual deficits, vascular dementia. Type of Study: Bedside Swallow Evaluation Previous Swallow Assessment: none Diet Prior to this Study: NPO Temperature Spikes Noted: No Respiratory Status: Room air History of Recent Intubation: No Behavior/Cognition: Alert;Requires cueing Oral Cavity Assessment: Within Functional Limits Oral Care Completed by SLP: No Oral Cavity - Dentition: Edentulous Vision: Impaired for self-feeding Self-Feeding Abilities: Total assist Patient Positioning: Upright in bed Baseline Vocal Quality: Not observed Volitional Cough: Cognitively unable to elicit Volitional Swallow: Unable to elicit    Oral/Motor/Sensory Function Overall Oral Motor/Sensory Function: Within functional limits   Ice Chips     Thin Liquid Thin Liquid: Impaired Presentation: Cup Oral Phase Functional Implications: Other (comment);Oral holding (pt spit out water)  Nectar Thick Nectar Thick Liquid: Not tested   Honey Thick Honey Thick Liquid: Not tested   Puree Puree: Impaired Presentation: Spoon Oral Phase Impairments: Poor awareness of bolus Other Comments:  (spit out bolus)   Solid            Alexis Ball, Alexis Ball 01/22/2021,2:35 PM

## 2021-01-22 NOTE — Progress Notes (Signed)
OT Cancellation Note  Patient Details Name: Alexis Ball MRN: [id number] DOB: [birth year]   Cancelled Treatment:    Reason Eval/Treat Not Completed: Nursing requesting to try later in the day, OT to check back as schedule allows.    [name] 01/22/2021, 8:57 AM

## 2021-01-23 ENCOUNTER — Ambulatory Visit: Payer: Medicare Other | Admitting: Podiatry

## 2021-01-23 DIAGNOSIS — Z515 Encounter for palliative care: Secondary | ICD-10-CM | POA: Diagnosis not present

## 2021-01-23 DIAGNOSIS — T50901A Poisoning by unspecified drugs, medicaments and biological substances, accidental (unintentional), initial encounter: Secondary | ICD-10-CM

## 2021-01-23 DIAGNOSIS — T405X1A Poisoning by cocaine, accidental (unintentional), initial encounter: Secondary | ICD-10-CM | POA: Diagnosis not present

## 2021-01-23 DIAGNOSIS — Z66 Do not resuscitate: Secondary | ICD-10-CM | POA: Diagnosis not present

## 2021-01-23 DIAGNOSIS — G8250 Quadriplegia, unspecified: Secondary | ICD-10-CM | POA: Diagnosis not present

## 2021-01-23 LAB — HEPATIC FUNCTION PANEL
ALT: 121 U/L — ABNORMAL HIGH (ref 0–44)
AST: 49 U/L — ABNORMAL HIGH (ref 15–41)
Albumin: 2.9 g/dL — ABNORMAL LOW (ref 3.5–5.0)
Alkaline Phosphatase: 69 U/L (ref 38–126)
Bilirubin, Direct: 0.2 mg/dL (ref 0.0–0.2)
Indirect Bilirubin: 0.3 mg/dL (ref 0.3–0.9)
Total Bilirubin: 0.5 mg/dL (ref 0.3–1.2)
Total Protein: 7 g/dL (ref 6.5–8.1)

## 2021-01-23 LAB — GLUCOSE, CAPILLARY
Glucose-Capillary: 120 mg/dL — ABNORMAL HIGH (ref 70–99)
Glucose-Capillary: 135 mg/dL — ABNORMAL HIGH (ref 70–99)
Glucose-Capillary: 157 mg/dL — ABNORMAL HIGH (ref 70–99)
Glucose-Capillary: 163 mg/dL — ABNORMAL HIGH (ref 70–99)
Glucose-Capillary: 184 mg/dL — ABNORMAL HIGH (ref 70–99)
Glucose-Capillary: 195 mg/dL — ABNORMAL HIGH (ref 70–99)
Glucose-Capillary: 199 mg/dL — ABNORMAL HIGH (ref 70–99)

## 2021-01-23 LAB — BASIC METABOLIC PANEL
Anion gap: 10 (ref 5–15)
BUN: 25 mg/dL — ABNORMAL HIGH (ref 8–23)
CO2: 22 mmol/L (ref 22–32)
Calcium: 9.4 mg/dL (ref 8.9–10.3)
Chloride: 100 mmol/L (ref 98–111)
Creatinine, Ser: 0.82 mg/dL (ref 0.44–1.00)
GFR, Estimated: >60 mL/min (ref >60)
Glucose, Bld: 172 mg/dL — ABNORMAL HIGH (ref 70–99)
Potassium: 3.8 mmol/L (ref 3.5–5.1)
Sodium: 132 mmol/L — ABNORMAL LOW (ref 135–145)

## 2021-01-23 LAB — CBC
HCT: 39.3 % (ref 36.0–46.0)
Hemoglobin: 12.8 g/dL (ref 12.0–15.0)
MCH: 29.8 pg (ref 26.0–34.0)
MCHC: 32.6 g/dL (ref 30.0–36.0)
MCV: 91.4 fL (ref 80.0–100.0)
Platelets: 336 10*3/uL (ref 150–400)
RBC: 4.3 MIL/uL (ref 3.87–5.11)
RDW: 13.1 % (ref 11.5–15.5)
WBC: 13.7 10*3/uL — ABNORMAL HIGH (ref 4.0–10.5)
nRBC: 0 % (ref 0.0–0.2)

## 2021-01-23 LAB — MAGNESIUM: Magnesium: 2 mg/dL (ref 1.7–2.4)

## 2021-01-23 LAB — PROCALCITONIN: Procalcitonin: <0.1 ng/mL

## 2021-01-23 LAB — PHOSPHORUS: Phosphorus: 4.4 mg/dL (ref 2.5–4.6)

## 2021-01-23 MED ORDER — POTASSIUM CHLORIDE 20 MEQ PO PACK
20.0000 meq | PACK | Freq: Once | ORAL | Status: AC
  Administered 2021-01-23: 20 meq
  Filled 2021-01-23: qty 1

## 2021-01-23 NOTE — Progress Notes (Signed)
Speech Language Pathology Treatment: Dysphagia;Cognitive-Linquistic  Patient Details Name: Alexis Ball MRN: [id number] DOB: [birth year] Today's Date: 01/23/2021 Time: 8309-4076 SLP Time Calculation (min) (ACUTE ONLY): 24 min  Assessment / Plan / Recommendation Clinical Impression  Pt sleepy initially but awakened after repositioning and oral care.  Able to follow simple commands intermittently today (e.g., raise eyebrows, wrinkle nose).  With verbal/tactile cues and modeling of basic counting from 1-3, she was able to say "one" after repetitive cues.   Swallowing is not yet functional. She demonstrated spontaneous swallowing during oral care today. She continued to track the spoon/straw as it moved toward her and opened her mouth in anticipation.  She sealed lips but did not swallow and instead expectorated 4/4 bolus trials. She required consistent verbal/tactile cues for attention.    Dtr in Sports coach, Alexis Ball, at bedside and very supportive.  Discussed our plan to continue to follow Alexis Ball for communication/swallowing.  She verbalized understanding.    HPI HPI: Pt is a 65 y.o.F admitted to ICU 01/15/2021 who was found unresponsive on her porch with PEA. She required intubation, multiple rounds of epinephrine, chest compressions and coded again in the ED x 2. Pt intubated 10/6-10/11. Pt also with acute encephalopathy, concern for anoxic injury; initial CT head and MRI brain neg. Significant PMH: recent cocaine use, CVA without residual deficits, vascular dementia.      SLP Plan  Continue with current plan of care      Recommendations for follow up therapy are one component of a multi-disciplinary discharge planning process, led by the attending physician.  Recommendations may be updated based on patient status, additional functional criteria and insurance authorization.    Recommendations  Diet recommendations: NPO                Oral Care Recommendations: Oral care QID Follow  up Recommendations: Skilled Nursing facility SLP Visit Diagnosis: Dysphagia, unspecified (R13.10) Plan: Continue with current plan of care                      Halton Neas L. Tivis Ringer, [place] CCC/SLP Acute Rehabilitation Services Office number [phone] Pager [phone]  Juan Quam Laurice 01/23/2021, 9:21 AM

## 2021-01-23 NOTE — Progress Notes (Signed)
Occupational Therapy Treatment Patient Details Name: Alexis Ball MRN: [id number] DOB: 09-04-1955 Today's Date: 01/23/2021   History of present illness Pt is a 65 y.o.F admitted to ICU 01/15/2021 who was found unresponsive on her porch with PEA. She required intubation, multiple rounds of epinephrine, chest compressions and coded again in the ED x 2. Pt intubated 10/6-10/11. Pt also with acute encephalopathy, concern for anoxic injury; initial CT head and MRI brain neg. Significant PMH: drug use, CVA without residual deficits, vascular dementia.   OT comments  Pt lethargic when in supine, alert once assisted to sit at EOB. Focus of session on sitting balance, trunk control. Pt able to follow 50% of commands, responding to yes/no questions with head nod and smiling appropriately. VSS on RA.    Recommendations for follow up therapy are one component of a multi-disciplinary discharge planning process, led by the attending physician.  Recommendations may be updated based on patient status, additional functional criteria and insurance authorization.    Follow Up Recommendations  SNF    Equipment Recommendations  Wheelchair (measurements OT);Wheelchair cushion (measurements OT);Hospital bed;Other (comment) (lift equipment)    Recommendations for Other Services      Precautions / Restrictions Precautions Precautions: Fall;Other (comment) Precaution Comments: cortrak, rectal tube, L mitt Restrictions Weight Bearing Restrictions: No       Mobility Bed Mobility Overal bed mobility: Needs Assistance Bed Mobility: Supine to Sit;Sit to Supine     Supine to sit: Total assist;+2 for physical assistance Sit to supine: Total assist;+2 for physical assistance   General bed mobility comments: totalA to complete safely, pt needing maxA to maintain static sitting balance once in sitting    Transfers                 General transfer comment: unable to attempt safely    Balance  Overall balance assessment: Needs assistance Sitting-balance support: Feet unsupported;No upper extremity supported Sitting balance-Leahy Scale: Zero Sitting balance - Comments: no righting reactions due to decreased LOA                           ADL either performed or assessed with clinical judgement   ADL                                         General ADL Comments: dependent, with support under elbow can reach toward her face     Vision       Perception     Praxis      Cognition Arousal/Alertness: Awake/alert Behavior During Therapy: Restless (appropriate facial expressions) Overall Cognitive Status: Impaired/Different from baseline Area of Impairment: Attention;Following commands;Awareness;Safety/judgement                   Current Attention Level: Focused   Following Commands: Follows one step commands inconsistently;Follows one step commands with increased time Safety/Judgement: Decreased awareness of safety;Decreased awareness of deficits Awareness: Intellectual   General Comments: needs increased time and cues to complete, following <50% of simple commands this session, but making attempts to follow various commands involving UE, facial expressions, and LE.        Exercises    Shoulder Instructions       General Comments VSS on RA    Pertinent Vitals/ Pain       Pain Assessment: Faces Faces Pain Scale: No hurt Pain  Intervention(s): Monitored during session  Home Living                                          Prior Functioning/Environment              Frequency  Min 2X/week        Progress Toward Goals  OT Goals(current goals can now be found in the care plan section)  Progress towards OT goals: Progressing toward goals  Acute Rehab OT Goals Patient Stated Goal: none stated, family hoping for improved alertness OT Goal Formulation: Patient unable to participate in goal  setting Time For Goal Achievement: 02/05/21 Potential to Achieve Goals: Good  Plan Discharge plan remains appropriate    Co-evaluation    PT/OT/SLP Co-Evaluation/Treatment: Yes Reason for Co-Treatment: Necessary to address cognition/behavior during functional activity PT goals addressed during session: Mobility/safety with mobility;Balance;Proper use of DME;Strengthening/ROM OT goals addressed during session: Strengthening/ROM      AM-PAC OT "6 Clicks" Daily Activity     Outcome Measure   Help from another person eating meals?: Total Help from another person taking care of personal grooming?: Total Help from another person toileting, which includes using toliet, bedpan, or urinal?: Total Help from another person bathing (including washing, rinsing, drying)?: Total Help from another person to put on and taking off regular upper body clothing?: Total Help from another person to put on and taking off regular lower body clothing?: Total 6 Click Score: 6    End of Session    OT Visit Diagnosis: Muscle weakness (generalized) (M62.81)   Activity Tolerance Patient tolerated treatment well   Patient Left in bed;with call bell/phone within reach;with bed alarm set   Nurse Communication Mobility status        Time: 3149-7026 OT Time Calculation (min): 32 min  Charges: OT General Charges $OT Visit: 1 Visit OT Treatments $Therapeutic Activity: 8-22 mins  Nestor Lewandowsky, OTR/L Acute Rehabilitation Services Pager: [phone] Office: [phone]   Malka So 01/23/2021, 2:17 PM

## 2021-01-23 NOTE — TOC Progression Note (Signed)
Transition of Care [facility]) - Progression Note    Patient Details  Name: Alexis Ball MRN: [id number] Date of Birth: [birth year]  Transition of Care [facility]) CM/SW [place], [place] Phone Number: 01/23/2021, 1:06 PM  Clinical Narrative:     CSW following to fax out initial referral. Patient has cortrak. CSW will continue to follow and assist with patients dc planning needs.  Expected Discharge Plan: [place] Barriers to Discharge: Continued Medical Work up  Expected Discharge Plan and Services Expected Discharge Plan: [place] In-house Referral: Clinical Social Work     Living arrangements for the past 2 months: Single Family Home                                       Social Determinants of Health (SDOH) Interventions    Readmission Risk Interventions No flowsheet data found.

## 2021-01-23 NOTE — Progress Notes (Signed)
PROGRESS NOTE    Alexis Ball  HQI:[id number] DOB: 1956/02/13 DOA: 01/15/2021 PCP: Arthur Holms, NP   Brief Narrative:  65 year old with extensive past medical history of GERD, CVA, DVT, hypertension, iron deficiency anemia, prediabetes, who was found on her porch by a neighbor early a.m. 01/15/2021 unresponsive EMS arrived to find PEA arrest she required intubation multiple rounds of epinephrine chest compressions and coded again in the emergency department x2.  Pulmonary critical care asked to admit, note she had drug paraphernalia around her, so this is presumed drug overdose.  She was admitted under ICU/PCCM and transferred to [facility] on 01/23/2021   Significant Hospital Events: Including procedures, antibiotic start and stop dates in addition to other pertinent events   01/15/2021 found down in PEA, on epi/ NE, re-arrest x 2 in ER, starting zosyn for aspiration pna, off pressors overnight 10/7 intermittently agitated, on precedex, prn versed, LTM d/c'd- neg seizure, MRI brain 10/12 Extubated yesterday, agitated requiring Precedex overnight    01/15/2021 CTH / cervical > no intracranial/ acute trauma findings to cervical, multilevel spinal degenerative changes greatest C5-6, dense consolidative changes at lung apices 01/15/2021 CTA chest/abd/ pelvis > neg dissection/ aneurysm, bilateral dependent lung consolidation, borderline to mild cardiomegaly, RCF venous catheter, minimally displaced anterior left 2nd rib fx 01/15/2021 lower extremity Doppler> negative 10/7 MRI brain >> non acute   10/6 SARS/ flu >neg 10/6 MRSA pcr > neg 10/6 Bcx2 >>neg 10/6 UC >>neg 10/7 trach asp >klebsiella pna   10/6 zosyn  10/7 unasyn >10/10  Assessment & Plan:   Active Problems:   Cardiac arrest ([place])   Respiratory failure (HCC)   Accidental drug overdose   Encephalopathy acute   History of ETT   Delirium  Acute encephalopathy, metabolic/ toxic and given multiple arrest, concern for anoxic injury.   Initial CT head and MRI brain neg.  EEG without seizues. Cocaine abuse: Remains off Precedex.  Calm at this point in time.  Appears to be following simple commands such as trying to wiggle her toes.  Unable to speak.  Will need extended therapies to help recover if she ever would.  She remains on Namenda, Seroquel and as needed Ativan for agitation.  PEA cardiac arrest- unclear etiology, possibly secondary to cocaine vs hypokalemia. TTE with EF 60-65%, normal RV, valves ok, no wall motion abnormalities. LE duplex neg and normal RV on TTE, very low risk of PE.  Continue supportive care.  Acute hypoxic respiratory insufficiency in the setting of cardiac arrest and possible aspiration pneumonia: Extubated on 01/20/2021.  Currently on room air.  Completed course of Unasyn.   Transaminitis: suspect from post arrest shock liver.  Improving.  Hyperglycemia: Controlled, continue SSI.  Dysphagia/nutrition: Nutrition per NG tube.  SLP following.  Remains NPO.  Failing swallow.  Essential hypertension: Controlled, continue amlodipine 10 mg daily, hydrochlorothiazide 25 mg and hydralazine 25 mg every 8 hours.  DVT prophylaxis: heparin injection 5,000 Units Start: 01/16/21 1400 SCDs Start: 01/15/21 0735   Code Status: Full Code  Family Communication: Daughter-in-law present at bedside.  Plan of care discussed with her.  Answered questions.  Confirmed CODE STATUS with her and they would like to keep her full code. Status is: Inpatient  Remains inpatient appropriate because: Needs inpatient hospitalization.  Estimated body mass index is 34.44 kg/m as calculated from the following:   Height as of 12/17/20: 5\' 2"  (1.575 m).   Weight as of this encounter: 85.4 kg.  Nutritional Assessment: Body mass index is 34.44 kg/m.Marland Kitchen Seen  by dietician.  I agree with the assessment and plan as outlined below: Nutrition Status: Nutrition Problem: Inadequate oral intake Etiology: acute illness Signs/Symptoms: NPO  status Interventions: MVI, Tube feeding  .  Skin Assessment: I have examined the patient's skin and I agree with the wound assessment as performed by the wound care RN as outlined below:    Consultants:  None  Procedures:  None  Antimicrobials:  Anti-infectives (From admission, onward)    Start     Dose/Rate Route Frequency Ordered Stop   01/19/21 1145  cefTRIAXone (ROCEPHIN) 2 g in sodium chloride 0.9 % 100 mL IVPB  Status:  Discontinued        2 g 200 mL/hr over 30 Minutes Intravenous Every 24 hours 01/19/21 1052 01/19/21 1052   01/16/21 1400  Ampicillin-Sulbactam (UNASYN) 3 g in sodium chloride 0.9 % 100 mL IVPB  Status:  Discontinued        3 g 200 mL/hr over 30 Minutes Intravenous Every 6 hours 01/16/21 0816 01/19/21 1052   01/15/21 1400  piperacillin-tazobactam (ZOSYN) IVPB 3.375 g  Status:  Discontinued        3.375 g 12.5 mL/hr over 240 Minutes Intravenous Every 8 hours 01/15/21 1104 01/16/21 0816   01/15/21 0800  Ampicillin-Sulbactam (UNASYN) 3 g in sodium chloride 0.9 % 100 mL IVPB  Status:  Discontinued        3 g 200 mL/hr over 30 Minutes Intravenous Every 6 hours 01/15/21 0652 01/15/21 1104          Subjective: Patient seen and examined.  Daughter-in-law at the bedside.  Objective: Vitals:   01/23/21 0500 01/23/21 0600 01/23/21 0800 01/23/21 0801  BP: 111/72 120/68 125/65   Pulse: (!) 105 (!) 104    Resp: (!) 22 20 (!) 22   Temp:    (!) 97.3 F (36.3 C)  TempSrc:    Oral  SpO2: 99% 100%    Weight: 85.4 kg       Intake/Output Summary (Last 24 hours) at 01/23/2021 1005 Last data filed at 01/23/2021 0800 Gross per 24 hour  Intake 870 ml  Output 550 ml  Net 320 ml   Filed Weights   01/21/21 0500 01/22/21 0100 01/23/21 0500  Weight: 89.5 kg 87.1 kg 85.4 kg    Examination:  General exam: Appears calm and comfortable  Respiratory system: Clear to auscultation. Respiratory effort normal. Cardiovascular system: S1 & S2 heard, RRR. No JVD,  murmurs, rubs, gallops or clicks. No pedal edema. Gastrointestinal system: Abdomen is nondistended, soft and nontender. No organomegaly or masses felt. Normal bowel sounds heard. Central nervous system: Alert but not oriented.  Able to wiggle toes in both lower extremities so power is 1 out of 4 in bilateral lower extremities and 1 out of 4 in right upper extremity but 0 in left upper extremity. Extremities: Symmetric 5 x 5 power. Skin: No rashes, lesions or ulcers   Data Reviewed: I have personally reviewed following labs and imaging studies  CBC: Recent Labs  Lab 01/19/21 1216 01/20/21 0031 01/21/21 0250 01/22/21 0105 01/23/21 0330  WBC 12.2* 10.2 13.6* 13.8* 13.7*  HGB 10.9* 9.9* 12.5 14.2 12.8  HCT 32.8* 30.1* 38.9 43.3 39.3  MCV 91.9 90.7 92.0 90.2 91.4  PLT 204 179 264 280 010   Basic Metabolic Panel: Recent Labs  Lab 01/19/21 1216 01/20/21 0031 01/21/21 0250 01/22/21 0105 01/23/21 0330  NA 140 138 139 137 132*  K 3.6 4.2 4.1 3.9 3.8  CL 108 104  100 101 100  CO2 25 26 27 22 22   GLUCOSE 160* 128* 123* 119* 172*  BUN 13 14 13 17  25*  CREATININE 0.67 0.60 0.68 0.68 0.82  CALCIUM 8.8* 8.8* 9.3 9.5 9.4  MG 1.8 1.9 2.1 QUANTITY NOT SUFFICIENT, UNABLE TO PERFORM TEST 2.0  PHOS 2.4* 3.6 3.3 3.6 4.4   GFR: Estimated Creatinine Clearance: 71.2 mL/min (by C-G formula based on SCr of 0.82 mg/dL). Liver Function Tests: Recent Labs  Lab 01/19/21 1216 01/20/21 0031 01/21/21 0250 01/22/21 0105 01/23/21 0330  AST 31 31 54* 97* 49*  ALT 126* 109* 112* 146* 121*  ALKPHOS 51 49 60 65 69  BILITOT 0.5 0.6 0.9 QUANTITY NOT SUFFICIENT, UNABLE TO PERFORM TEST 0.5  PROT 6.2* 6.0* 6.9 7.6 7.0  ALBUMIN 2.6* 2.5* 2.8* 2.9* 2.9*   No results for input(s): LIPASE, AMYLASE in the last 168 hours. Recent Labs  Lab 01/18/21 0225 01/19/21 1216  AMMONIA 54* 45*   Coagulation Profile: No results for input(s): INR, PROTIME in the last 168 hours. Cardiac Enzymes: No results for  input(s): CKTOTAL, CKMB, CKMBINDEX, TROPONINI in the last 168 hours. BNP (last 3 results) No results for input(s): PROBNP in the last 8760 hours. HbA1C: No results for input(s): HGBA1C in the last 72 hours. CBG: Recent Labs  Lab 01/22/21 1550 01/22/21 2048 01/23/21 0011 01/23/21 0412 01/23/21 0806  GLUCAP 234* 181* 135* 184* 163*   Lipid Profile: No results for input(s): CHOL, HDL, LDLCALC, TRIG, CHOLHDL, LDLDIRECT in the last 72 hours. Thyroid Function Tests: No results for input(s): TSH, T4TOTAL, FREET4, T3FREE, THYROIDAB in the last 72 hours. Anemia Panel: No results for input(s): VITAMINB12, FOLATE, FERRITIN, TIBC, IRON, RETICCTPCT in the last 72 hours. Sepsis Labs: Recent Labs  Lab 01/23/21 0330  PROCALCITON <0.10    Recent Results (from the past 240 hour(s))  Urine Culture     Status: None   Collection Time: 01/15/21  7:38 AM   Specimen: Urine, Catheterized  Result Value Ref Range Status   Specimen Description URINE, CATHETERIZED  Final   Special Requests NONE  Final   Culture   Final    NO GROWTH Performed at [facility], [address] [place], [place] [address]    Report Status 01/16/2021 FINAL  Final  Culture, blood (routine x 2)     Status: None   Collection Time: 01/15/21  8:08 AM   Specimen: BLOOD  Result Value Ref Range Status   Specimen Description BLOOD SITE NOT SPECIFIED  Final   Special Requests   Final    BOTTLES DRAWN AEROBIC AND ANAEROBIC Blood Culture results may not be optimal due to an excessive volume of blood received in culture bottles   Culture   Final    NO GROWTH 5 DAYS Performed at [facility], [place] [address], [place], [place] [address]    Report Status 01/20/2021 FINAL  Final  Culture, blood (routine x 2)     Status: None   Collection Time: 01/15/21  8:09 AM   Specimen: BLOOD  Result Value Ref Range Status   Specimen Description BLOOD SITE NOT SPECIFIED  Final   Special Requests   Final    BOTTLES DRAWN AEROBIC  ONLY Blood Culture adequate volume   Culture   Final    NO GROWTH 5 DAYS Performed at [facility], [address] [place],  [address]    Report Status 01/20/2021 FINAL  Final  Resp Panel by RT-PCR (  Flu A&B, Covid) Nasopharyngeal Swab     Status: None   Collection Time: 01/15/21  8:22 AM   Specimen: Nasopharyngeal Swab; Nasopharyngeal(NP) swabs in vial transport medium  Result Value Ref Range Status   SARS Coronavirus 2 by RT PCR NEGATIVE NEGATIVE Final    Comment: (NOTE) SARS-CoV-2 target nucleic acids are NOT DETECTED.  The SARS-CoV-2 RNA is generally detectable in upper respiratory specimens during the acute phase of infection. The lowest concentration of SARS-CoV-2 viral copies this assay can detect is 138 copies/mL. A negative result does not preclude SARS-Cov-2 infection and should not be used as the sole basis for treatment or other patient management decisions. A negative result may occur with  improper specimen collection/handling, submission of specimen other than nasopharyngeal swab, presence of viral mutation(s) within the areas targeted by this assay, and inadequate number of viral copies(<138 copies/mL). A negative result must be combined with clinical observations, patient history, and epidemiological information. The expected result is Negative.  Fact Sheet for Patients:  EntrepreneurPulse.com.au  Fact Sheet for Healthcare Providers:  IncredibleEmployment.be  This test is no t yet approved or cleared by the [place] FDA and  has been authorized for detection and/or diagnosis of SARS-CoV-2 by FDA under an Emergency Use Authorization (EUA). This EUA will remain  in effect (meaning this test can be used) for the duration of the COVID-19 declaration under Section 564(b)(1) of the Act, 21 U.S.C.section 360bbb-3(b)(1), unless the authorization is terminated  or revoked sooner.       Influenza A by PCR NEGATIVE  NEGATIVE Final   Influenza B by PCR NEGATIVE NEGATIVE Final    Comment: (NOTE) The Xpert Xpress SARS-CoV-2/FLU/RSV plus assay is intended as an aid in the diagnosis of influenza from Nasopharyngeal swab specimens and should not be used as a sole basis for treatment. Nasal washings and aspirates are unacceptable for Xpert Xpress SARS-CoV-2/FLU/RSV testing.  Fact Sheet for Patients: EntrepreneurPulse.com.au  Fact Sheet for Healthcare Providers: IncredibleEmployment.be  This test is not yet approved or cleared by the [place] FDA and has been authorized for detection and/or diagnosis of SARS-CoV-2 by FDA under an Emergency Use Authorization (EUA). This EUA will remain in effect (meaning this test can be used) for the duration of the COVID-19 declaration under Section 564(b)(1) of the Act, 21 U.S.C. section 360bbb-3(b)(1), unless the authorization is terminated or revoked.  Performed at [facility], [place] [address] [place], [place] [address]   MRSA Next Gen by PCR, Nasal     Status: None   Collection Time: 01/15/21 10:31 AM   Specimen: Nasal Swab  Result Value Ref Range Status   MRSA by PCR Next Gen NOT DETECTED NOT DETECTED Final    Comment: (NOTE) The GeneXpert MRSA Assay (FDA approved for NASAL specimens only), is one component of a comprehensive MRSA colonization surveillance program. It is not intended to diagnose MRSA infection nor to guide or monitor treatment for MRSA infections. Test performance is not FDA approved in patients less than 4 years old. Performed at [facility], [place] [address], [place], [place] [address]   Culture, blood (routine x 2)     Status: None   Collection Time: 01/15/21 11:12 AM   Specimen: BLOOD  Result Value Ref Range Status   Specimen Description BLOOD RIGHT ANTECUBITAL  Final   Special Requests IN PEDIATRIC BOTTLE Blood Culture adequate volume  Final   Culture   Final    NO GROWTH 5  DAYS Performed at  [facility], [place] [address] [place], [place] [address]    Report Status 01/20/2021 FINAL  Final  Culture, blood (routine x 2)     Status: None   Collection Time: 01/15/21 11:12 AM   Specimen: BLOOD  Result Value Ref Range Status   Specimen Description BLOOD LEFT ANTECUBITAL  Final   Special Requests IN PEDIATRIC BOTTLE Blood Culture adequate volume  Final   Culture   Final    NO GROWTH 5 DAYS Performed at [facility], [place] [address], [place], [place] [address]    Report Status 01/20/2021 FINAL  Final  Culture, Respiratory w Gram Stain     Status: None   Collection Time: 01/16/21  8:53 AM   Specimen: Tracheal Aspirate; Respiratory  Result Value Ref Range Status   Specimen Description TRACHEAL ASPIRATE  Final   Special Requests NONE  Final   Gram Stain   Final    MODERATE WBC PRESENT,BOTH PMN AND MONONUCLEAR RARE YEAST Performed at [facility], [address] [place], [place] [address]    Culture FEW KLEBSIELLA PNEUMONIAE FEW CANDIDA ALBICANS   Final   Report Status 01/18/2021 FINAL  Final   Organism ID, Bacteria KLEBSIELLA PNEUMONIAE  Final      Susceptibility   Klebsiella pneumoniae - MIC*    AMPICILLIN >=32 RESISTANT Resistant     CEFAZOLIN <=4 SENSITIVE Sensitive     CEFEPIME <=0.12 SENSITIVE Sensitive     CEFTAZIDIME <=1 SENSITIVE Sensitive     CEFTRIAXONE <=0.25 SENSITIVE Sensitive     CIPROFLOXACIN <=0.25 SENSITIVE Sensitive     GENTAMICIN <=1 SENSITIVE Sensitive     IMIPENEM <=0.25 SENSITIVE Sensitive     TRIMETH/SULFA >=320 RESISTANT Resistant     AMPICILLIN/SULBACTAM 8 SENSITIVE Sensitive     PIP/TAZO <=4 SENSITIVE Sensitive     * FEW KLEBSIELLA PNEUMONIAE      Radiology Studies: DG Abd Portable 1V  Result Date: 01/21/2021 CLINICAL DATA:  Feeding tube placement EXAM: PORTABLE ABDOMEN - 1 VIEW COMPARISON:  None. FINDINGS: Feeding tube terminates in the distal stomach. Nonobstructive bowel gas pattern where imaged.  Mild right hemidiaphragm elevation. No free intraperitoneal air. IMPRESSION: Feeding tube terminating at the distal stomach. Electronically Signed   By: Abigail Miyamoto M.D.   On: 01/21/2021 11:18   EEG adult  Result Date: 01/21/2021 Lora Havens, MD     01/21/2021  3:29 PM Patient Name: Alexis Ball MRN: [id number] Epilepsy Attending: Lora Havens Referring Physician/Provider: laura Gleason, NP Date:10/612/2022 Duration: 22.07 mins  Patient history: 65 year old female with polysubstance dependence was brought into the emergency department after she was found unresponsive and pulseless by her neighbor, EMS started CPR, ROSC was achieved and patient was transferred to emergency department, she underwent PEA arrest again, ROSC was achieved after 3 minutes of CPR.  EEG to evaluate for seizures.  Level of alertness: lethargic  AEDs during EEG study: None  Technical aspects: This EEG study was done with scalp electrodes positioned according to the 10-20 International system of electrode placement. Electrical activity was acquired at a sampling rate of 500Hz  and reviewed with a high frequency filter of 70Hz  and a low frequency filter of 1Hz . EEG data were recorded continuously and digitally stored.  Description:  EEG showed continuous generalized 3 to 5 Hz theta-delta slowing.  Hyperventilation and photic stimulation were not performed.   ABNORMALITY -Continuous slow, generalized  IMPRESSION: This study is suggestive of moderate to severe diffuse encephalopathy, nonspecific to  etiology.  No seizures or epileptiform discharges were seen throughout the recording.  Priyanka Barbra Sarks    Scheduled Meds:  amLODipine  10 mg Per Tube QHS   brimonidine  1 drop Both Eyes BID   chlorhexidine  15 mL Mouth Rinse BID   Chlorhexidine Gluconate Cloth  6 each Topical Daily   dorzolamide-timolol  1 drop Both Eyes BID   feeding supplement (PROSource TF)  45 mL Per Tube Daily   gabapentin  300 mg Per Tube TID    heparin injection (subcutaneous)  5,000 Units Subcutaneous Q8H   hydrALAZINE  25 mg Per Tube Q8H   hydrochlorothiazide  25 mg Per Tube Daily   insulin aspart  2-6 Units Subcutaneous Q4H   latanoprost  1 drop Both Eyes QHS   mouth rinse  15 mL Mouth Rinse q12n4p   memantine  10 mg Per Tube Daily   pantoprazole (PROTONIX) IV  40 mg Intravenous QHS   QUEtiapine  150 mg Per Tube QHS   Continuous Infusions:  sodium chloride     feeding supplement (OSMOLITE 1.2 CAL) 1,000 mL (01/22/21 0809)     LOS: 8 days   Time spent: 36 minutes   Darliss Cheney, MD [facility]  01/23/2021, 10:05 AM  Please page via [facility] and do not message via secure chat for anything urgent. Secure chat can be used for anything non urgent.  How to contact the [facility] Attending or Consulting provider [place] or covering provider during after hours [place], for this patient?  Check the care team in [facility] and look for a) attending/consulting [facility] provider listed and b) the [facility] team listed. Page or secure chat [phone]. Log into [email or web address] and use [place]'s universal password to access. If you do not have the password, please contact the hospital operator. Locate the [facility] provider you are looking for under [facility] and page to a number that you can be directly reached. If you still have difficulty reaching the provider, please page the [facility] (Director on Call) for the Hospitalists listed on amion for assistance.

## 2021-01-23 NOTE — Progress Notes (Signed)
Physical Therapy Treatment Patient Details Name: Alexis Ball MRN: [id number] DOB: 01/11/1956 Today's Date: 01/23/2021   History of Present Illness Pt is a 65 y.o.F admitted to ICU 01/15/2021 who was found unresponsive on her porch with PEA. She required intubation, multiple rounds of epinephrine, chest compressions and coded again in the ED x 2. Pt intubated 10/6-10/11. Pt also with acute encephalopathy, concern for anoxic injury; initial CT head and MRI brain neg. Significant PMH: drug use, CVA without residual deficits, vascular dementia.    PT Comments    The pt was able to participate in today's session with improved command following and attention to therapists. The pt continues to require totalA of 2 to complete bed mobility, and minA-maxA to maintain static sitting balance. She was able to initiate lateral leaning and movement to command with all 4 extremities, but needed assist  to complete movements as well as multimodal cues at times. Continue to recommend SNF at d/c due to level of assist needed, will continue to assess as pt progresses.    Recommendations for follow up therapy are one component of a multi-disciplinary discharge planning process, led by the attending physician.  Recommendations may be updated based on patient status, additional functional criteria and insurance authorization.  Follow Up Recommendations  SNF;Supervision/Assistance - 24 hour     Equipment Recommendations  Other (comment)    Recommendations for Other Services       Precautions / Restrictions Precautions Precautions: Fall;Other (comment) Precaution Comments: NGT, rectal tube, bilat mitts Restrictions Weight Bearing Restrictions: No     Mobility  Bed Mobility Overal bed mobility: Needs Assistance Bed Mobility: Supine to Sit;Sit to Supine     Supine to sit: Total assist;+2 for physical assistance Sit to supine: Total assist;+2 for physical assistance   General bed mobility  comments: totalA to complete safely, pt needing maxA to maintain static sitting balance once in sitting    Transfers                 General transfer comment: unable to attempt safely      Balance Overall balance assessment: Needs assistance Sitting-balance support: Feet unsupported;No upper extremity supported Sitting balance-Leahy Scale: Zero Sitting balance - Comments: no righting reactions due to decreased LOA Postural control: Posterior lean;Right lateral lean;Left lateral lean Standing balance support: Single extremity supported;Bilateral upper extremity supported Standing balance-Leahy Scale: Zero Standing balance comment: dependent on min-maxA to maintain static sitting. pt with frequent LOB in all directions                            Cognition Arousal/Alertness: Awake/alert Behavior During Therapy: Restless;WFL for tasks assessed/performed Overall Cognitive Status: Impaired/Different from baseline Area of Impairment: Following commands;Safety/judgement;Awareness                       Following Commands: Follows one step commands inconsistently;Follows one step commands with increased time Safety/Judgement: Decreased awareness of safety Awareness: Intellectual   General Comments: needs increased time and cues to complete, following <50% of simple commands this session, but making attempts to follow various commands involving UE, facial expressions, and LE.      Exercises General Exercises - Lower Extremity Ankle Circles/Pumps: PROM;Both;10 reps Long Arc Quad: AROM;Both;5 reps;Seated Heel Slides: AAROM;Both;5 reps;Supine Other Exercises Other Exercises: lateral leaning to propped elbow both directions x5, modA to return to sitting    General Comments General comments (skin integrity, edema, etc.): VSS on  RA      Pertinent Vitals/Pain Pain Assessment: Faces Faces Pain Scale: No hurt Pain Intervention(s): Monitored during session      PT Goals (current goals can now be found in the care plan section) Acute Rehab PT Goals Patient Stated Goal: none stated, family hoping for improved alertness PT Goal Formulation: With patient/family Time For Goal Achievement: 02/04/21 Potential to Achieve Goals: Fair Progress towards PT goals: Progressing toward goals    Frequency    Min 3X/week      PT Plan Current plan remains appropriate    Co-evaluation PT/OT/SLP Co-Evaluation/Treatment: Yes Reason for Co-Treatment: Necessary to address cognition/behavior during functional activity;For patient/therapist safety;To address functional/ADL transfers;Complexity of the patient's impairments (multi-system involvement) PT goals addressed during session: Mobility/safety with mobility;Balance;Proper use of DME;Strengthening/ROM        AM-PAC PT "6 Clicks" Mobility   Outcome Measure  Help needed turning from your back to your side while in a flat bed without using bedrails?: Total Help needed moving from lying on your back to sitting on the side of a flat bed without using bedrails?: Total Help needed moving to and from a bed to a chair (including a wheelchair)?: Total Help needed standing up from a chair using your arms (e.g., wheelchair or bedside chair)?: Total Help needed to walk in hospital room?: Total Help needed climbing 3-5 steps with a railing? : Total 6 Click Score: 6    End of Session   Activity Tolerance: Patient tolerated treatment well Patient left: in bed;with call bell/phone within reach;with nursing/sitter in room;with family/visitor present;with restraints reapplied Nurse Communication: Mobility status PT Visit Diagnosis: Other abnormalities of gait and mobility (R26.89);Muscle weakness (generalized) (M62.81)     Time: 4854-6270 PT Time Calculation (min) (ACUTE ONLY): 32 min  Charges:  $Therapeutic Activity: 8-22 mins                     West Carbo, PT, DPT   Acute Rehabilitation  Department Pager #: [phone]   Sandra Cockayne 01/23/2021, 1:49 PM

## 2021-01-24 DIAGNOSIS — T50901A Poisoning by unspecified drugs, medicaments and biological substances, accidental (unintentional), initial encounter: Secondary | ICD-10-CM | POA: Diagnosis not present

## 2021-01-24 LAB — GLUCOSE, CAPILLARY
Glucose-Capillary: 195 mg/dL — ABNORMAL HIGH (ref 70–99)
Glucose-Capillary: 200 mg/dL — ABNORMAL HIGH (ref 70–99)
Glucose-Capillary: 160 mg/dL — ABNORMAL HIGH (ref 70–99)
Glucose-Capillary: 175 mg/dL — ABNORMAL HIGH (ref 70–99)
Glucose-Capillary: 136 mg/dL — ABNORMAL HIGH (ref 70–99)

## 2021-01-24 LAB — CBC WITH DIFFERENTIAL/PLATELET
Abs Immature Granulocytes: 0.24 10*3/uL — ABNORMAL HIGH (ref 0.00–0.07)
Basophils Absolute: 0.1 10*3/uL (ref 0.0–0.1)
Basophils Relative: 1 %
Eosinophils Absolute: 0 10*3/uL (ref 0.0–0.5)
Eosinophils Relative: 0 %
HCT: 39.2 % (ref 36.0–46.0)
Hemoglobin: 13.1 g/dL (ref 12.0–15.0)
Immature Granulocytes: 1 %
Lymphocytes Relative: 11 %
Lymphs Abs: 1.9 10*3/uL (ref 0.7–4.0)
MCH: 29.9 pg (ref 26.0–34.0)
MCHC: 33.4 g/dL (ref 30.0–36.0)
MCV: 89.5 fL (ref 80.0–100.0)
Monocytes Absolute: 1.9 10*3/uL — ABNORMAL HIGH (ref 0.1–1.0)
Monocytes Relative: 11 %
Neutro Abs: 13.1 10*3/uL — ABNORMAL HIGH (ref 1.7–7.7)
Neutrophils Relative %: 76 %
Platelets: 380 10*3/uL (ref 150–400)
RBC: 4.38 MIL/uL (ref 3.87–5.11)
RDW: 12.9 % (ref 11.5–15.5)
WBC: 17.2 10*3/uL — ABNORMAL HIGH (ref 4.0–10.5)
nRBC: 0 % (ref 0.0–0.2)

## 2021-01-24 MED ORDER — CARVEDILOL 3.125 MG PO TABS
3.1250 mg | ORAL_TABLET | Freq: Two times a day (BID) | ORAL | Status: DC
  Administered 2021-01-24 – 2021-01-25 (×3): 3.125 mg via ORAL
  Filled 2021-01-24 (×4): qty 1

## 2021-01-24 MED ORDER — INSULIN GLARGINE-YFGN 100 UNIT/ML ~~LOC~~ SOLN
10.0000 [IU] | Freq: Every day | SUBCUTANEOUS | Status: DC
  Administered 2021-01-24 – 2021-01-28 (×4): 10 [IU] via SUBCUTANEOUS
  Filled 2021-01-24 (×5): qty 0.1

## 2021-01-24 NOTE — Progress Notes (Signed)
Pt admitted to the unit as a transfer from [facility]. Pt alert but nonverbal, VSS, telemetry applied and verified with CCMD and NT called to confirm. Sacral foam dsg clean and intact, rectal pouch and pur-wick intact. Delia Heady RN   01/24/21 1743  Vitals  Temp 98.1 F (36.7 C)  Temp Source Oral  BP (!) 175/93  MAP (mmHg) 117  BP Location Right Arm  BP Method Automatic  Patient Position (if appropriate) Lying  Pulse Rate (!) 101  Pulse Rate Source Monitor  Resp 18  Level of Consciousness  Level of Consciousness Alert  MEWS COLOR  MEWS Score Color Green  Oxygen Therapy  SpO2 97 %  O2 Device Room Air  Pain Assessment  Pain Scale Faces  Faces Pain Scale 0  MEWS Score  MEWS Temp 0  MEWS Systolic 0  MEWS Pulse 1  MEWS RR 0  MEWS LOC 0  MEWS Score 1

## 2021-01-24 NOTE — Progress Notes (Addendum)
PROGRESS NOTE    Alexis Ball  ZOX:[id number] DOB: May 08, 1955 DOA: 01/15/2021 PCP: Arthur Holms, NP   Brief Narrative:  65 year old with extensive past medical history of GERD, CVA, DVT, hypertension, iron deficiency anemia, prediabetes, who was found on her porch by a neighbor early a.m. 01/15/2021 unresponsive EMS arrived to find PEA arrest she required intubation multiple rounds of epinephrine chest compressions and coded again in the emergency department x2.  Pulmonary critical care asked to admit, note she had drug paraphernalia around her, so this is presumed drug overdose.  She was admitted under ICU/PCCM and transferred to [facility] on 01/23/2021   Significant Hospital Events: Including procedures, antibiotic start and stop dates in addition to other pertinent events   01/15/2021 found down in PEA, on epi/ NE, re-arrest x 2 in ER, starting zosyn for aspiration pna, off pressors overnight 10/7 intermittently agitated, on precedex, prn versed, LTM d/c'd- neg seizure, MRI brain 10/12 Extubated yesterday, agitated requiring Precedex overnight    01/15/2021 CTH / cervical > no intracranial/ acute trauma findings to cervical, multilevel spinal degenerative changes greatest C5-6, dense consolidative changes at lung apices 01/15/2021 CTA chest/abd/ pelvis > neg dissection/ aneurysm, bilateral dependent lung consolidation, borderline to mild cardiomegaly, RCF venous catheter, minimally displaced anterior left 2nd rib fx 01/15/2021 lower extremity Doppler> negative 10/7 MRI brain >> non acute   10/6 SARS/ flu >neg 10/6 MRSA pcr > neg 10/6 Bcx2 >>neg 10/6 UC >>neg 10/7 trach asp >klebsiella pna   10/6 zosyn  10/7 unasyn >10/10  Assessment & Plan:   Active Problems:   Cardiac arrest ([place])   Respiratory failure (HCC)   Accidental drug overdose   Encephalopathy acute   History of ETT   Delirium  Acute encephalopathy, metabolic/ toxic and given multiple arrest, concern for anoxic injury.   Initial CT head and MRI brain neg.  EEG without seizues. Cocaine abuse: Precedex discontinued on 01/21/2021 calm at this point in time.  Appears to be following simple commands such as trying to wiggle her toes.  Unable to speak.  Will need extended therapies to help recover if she ever would.  She remains on Namenda, Seroquel and as needed Ativan for agitation.  PEA cardiac arrest- unclear etiology, possibly secondary to cocaine vs hypokalemia. TTE with EF 60-65%, normal RV, valves ok, no wall motion abnormalities. LE duplex neg and normal RV on TTE, very low risk of PE.  Continue supportive care.  Acute hypoxic respiratory insufficiency in the setting of cardiac arrest and possible aspiration pneumonia: Extubated on 01/20/2021.  Currently on room air.  Completed course of Unasyn.  AKI: Likely due to dehydration.  Will start on gentle hydration.   Transaminitis: suspect from post arrest shock liver.  Improving.  Hyperglycemia: Slightly elevated, will start on Semglee 10 units and continue SSI.  Dysphagia/nutrition: Nutrition per NG tube.  SLP following.  Remains NPO.  Failing swallow.  Essential hypertension: Controlled, continue amlodipine 10 mg daily, hydrochlorothiazide 25 mg and hydralazine 25 mg every 8 hours.  DVT prophylaxis: heparin injection 5,000 Units Start: 01/16/21 1400 SCDs Start: 01/15/21 0735   Code Status: Full Code  Family Communication: Daughter present at bedside.  Plan of care discussed with her.  Answered questions.    Status is: Inpatient  Remains inpatient appropriate because: Needs inpatient hospitalization.  Estimated body mass index is 34.44 kg/m as calculated from the following:   Height as of this encounter: 5\' 2"  (1.575 m).   Weight as of this encounter: 85.4 kg.  Nutritional Assessment: Body mass index is 34.44 kg/m.Marland Kitchen Seen by dietician.  I agree with the assessment and plan as outlined below: Nutrition Status: Nutrition Problem: Inadequate oral  intake Etiology: acute illness Signs/Symptoms: NPO status Interventions: MVI, Tube feeding  .  Skin Assessment: I have examined the patient's skin and I agree with the wound assessment as performed by the wound care RN as outlined below:    Consultants:  None  Procedures:  None  Antimicrobials:  Anti-infectives (From admission, onward)    Start     Dose/Rate Route Frequency Ordered Stop   01/19/21 1145  cefTRIAXone (ROCEPHIN) 2 g in sodium chloride 0.9 % 100 mL IVPB  Status:  Discontinued        2 g 200 mL/hr over 30 Minutes Intravenous Every 24 hours 01/19/21 1052 01/19/21 1052   01/16/21 1400  Ampicillin-Sulbactam (UNASYN) 3 g in sodium chloride 0.9 % 100 mL IVPB  Status:  Discontinued        3 g 200 mL/hr over 30 Minutes Intravenous Every 6 hours 01/16/21 0816 01/19/21 1052   01/15/21 1400  piperacillin-tazobactam (ZOSYN) IVPB 3.375 g  Status:  Discontinued        3.375 g 12.5 mL/hr over 240 Minutes Intravenous Every 8 hours 01/15/21 1104 01/16/21 0816   01/15/21 0800  Ampicillin-Sulbactam (UNASYN) 3 g in sodium chloride 0.9 % 100 mL IVPB  Status:  Discontinued        3 g 200 mL/hr over 30 Minutes Intravenous Every 6 hours 01/15/21 0652 01/15/21 1104          Subjective: Seen and examined.  Daughter at the bedside.  Patient sleepy but easily arousable and nonverbal.  Trying to follow commands.  Looks very tired.  Objective: Vitals:   01/24/21 0700 01/24/21 0744 01/24/21 0800 01/24/21 0900  BP: (!) 155/68  (!) 152/92 (!) 154/78  Pulse: (!) 107  (!) 105 98  Resp: (!) 31  (!) 26 (!) 26  Temp:  97.8 F (36.6 C)    TempSrc:  Oral    SpO2: 96%  96% 96%  Weight:      Height:        Intake/Output Summary (Last 24 hours) at 01/24/2021 1040 Last data filed at 01/24/2021 0900 Gross per 24 hour  Intake 60 ml  Output 695 ml  Net -635 ml    Filed Weights   01/22/21 0100 01/23/21 0500 01/24/21 0500  Weight: 87.1 kg 85.4 kg 85.4 kg    Examination:  General  exam: Appears calm and comfortable  Respiratory system: Clear to auscultation. Respiratory effort normal. Cardiovascular system: S1 & S2 heard, RRR. No JVD, murmurs, rubs, gallops or clicks. No pedal edema. Gastrointestinal system: Abdomen is nondistended, soft and nontender. No organomegaly or masses felt. Normal bowel sounds heard. Central nervous system: Sleepy and unable to assess orientation due to her nonverbal status.  Able to wiggle toes and fingers and all 4 extremities.  Data Reviewed: I have personally reviewed following labs and imaging studies  CBC: Recent Labs  Lab 01/20/21 0031 01/21/21 0250 01/22/21 0105 01/23/21 0330 01/24/21 0229  WBC 10.2 13.6* 13.8* 13.7* 17.2*  NEUTROABS  --   --   --   --  13.1*  HGB 9.9* 12.5 14.2 12.8 13.1  HCT 30.1* 38.9 43.3 39.3 39.2  MCV 90.7 92.0 90.2 91.4 89.5  PLT 179 264 280 336 882    Basic Metabolic Panel: Recent Labs  Lab 01/19/21 1216 01/20/21 0031 01/21/21 0250 01/22/21 0105  01/23/21 0330  NA 140 138 139 137 132*  K 3.6 4.2 4.1 3.9 3.8  CL 108 104 100 101 100  CO2 25 26 27 22 22   GLUCOSE 160* 128* 123* 119* 172*  BUN 13 14 13 17  25*  CREATININE 0.67 0.60 0.68 0.68 0.82  CALCIUM 8.8* 8.8* 9.3 9.5 9.4  MG 1.8 1.9 2.1 QUANTITY NOT SUFFICIENT, UNABLE TO PERFORM TEST 2.0  PHOS 2.4* 3.6 3.3 3.6 4.4    GFR: Estimated Creatinine Clearance: 71.2 mL/min (by C-G formula based on SCr of 0.82 mg/dL). Liver Function Tests: Recent Labs  Lab 01/19/21 1216 01/20/21 0031 01/21/21 0250 01/22/21 0105 01/23/21 0330  AST 31 31 54* 97* 49*  ALT 126* 109* 112* 146* 121*  ALKPHOS 51 49 60 65 69  BILITOT 0.5 0.6 0.9 QUANTITY NOT SUFFICIENT, UNABLE TO PERFORM TEST 0.5  PROT 6.2* 6.0* 6.9 7.6 7.0  ALBUMIN 2.6* 2.5* 2.8* 2.9* 2.9*    No results for input(s): LIPASE, AMYLASE in the last 168 hours. Recent Labs  Lab 01/18/21 0225 01/19/21 1216  AMMONIA 54* 45*    Coagulation Profile: No results for input(s): INR, PROTIME in  the last 168 hours. Cardiac Enzymes: No results for input(s): CKTOTAL, CKMB, CKMBINDEX, TROPONINI in the last 168 hours. BNP (last 3 results) No results for input(s): PROBNP in the last 8760 hours. HbA1C: No results for input(s): HGBA1C in the last 72 hours. CBG: Recent Labs  Lab 01/23/21 1610 01/23/21 1945 01/23/21 2356 01/24/21 0407 01/24/21 0745  GLUCAP 157* 199* 195* 195* 200*    Lipid Profile: No results for input(s): CHOL, HDL, LDLCALC, TRIG, CHOLHDL, LDLDIRECT in the last 72 hours. Thyroid Function Tests: No results for input(s): TSH, T4TOTAL, FREET4, T3FREE, THYROIDAB in the last 72 hours. Anemia Panel: No results for input(s): VITAMINB12, FOLATE, FERRITIN, TIBC, IRON, RETICCTPCT in the last 72 hours. Sepsis Labs: Recent Labs  Lab 01/23/21 0330  PROCALCITON <0.10     Recent Results (from the past 240 hour(s))  Urine Culture     Status: None   Collection Time: 01/15/21  7:38 AM   Specimen: Urine, Catheterized  Result Value Ref Range Status   Specimen Description URINE, CATHETERIZED  Final   Special Requests NONE  Final   Culture   Final    NO GROWTH Performed at [facility], [address] [place], [place] [address]    Report Status 01/16/2021 FINAL  Final  Culture, blood (routine x 2)     Status: None   Collection Time: 01/15/21  8:08 AM   Specimen: BLOOD  Result Value Ref Range Status   Specimen Description BLOOD SITE NOT SPECIFIED  Final   Special Requests   Final    BOTTLES DRAWN AEROBIC AND ANAEROBIC Blood Culture results may not be optimal due to an excessive volume of blood received in culture bottles   Culture   Final    NO GROWTH 5 DAYS Performed at [facility], [place] [address], [place], [place] [address]    Report Status 01/20/2021 FINAL  Final  Culture, blood (routine x 2)     Status: None   Collection Time: 01/15/21  8:09 AM   Specimen: BLOOD  Result Value Ref Range Status   Specimen Description BLOOD SITE NOT SPECIFIED   Final   Special Requests   Final    BOTTLES DRAWN AEROBIC ONLY Blood Culture adequate volume   Culture   Final    NO GROWTH 5 DAYS Performed at  [facility], [place] [address], [place], [place] [address]    Report Status 01/20/2021 FINAL  Final  Resp Panel by RT-PCR (Flu A&B, Covid) Nasopharyngeal Swab     Status: None   Collection Time: 01/15/21  8:22 AM   Specimen: Nasopharyngeal Swab; Nasopharyngeal(NP) swabs in vial transport medium  Result Value Ref Range Status   SARS Coronavirus 2 by RT PCR NEGATIVE NEGATIVE Final    Comment: (NOTE) SARS-CoV-2 target nucleic acids are NOT DETECTED.  The SARS-CoV-2 RNA is generally detectable in upper respiratory specimens during the acute phase of infection. The lowest concentration of SARS-CoV-2 viral copies this assay can detect is 138 copies/mL. A negative result does not preclude SARS-Cov-2 infection and should not be used as the sole basis for treatment or other patient management decisions. A negative result may occur with  improper specimen collection/handling, submission of specimen other than nasopharyngeal swab, presence of viral mutation(s) within the areas targeted by this assay, and inadequate number of viral copies(<138 copies/mL). A negative result must be combined with clinical observations, patient history, and epidemiological information. The expected result is Negative.  Fact Sheet for Patients:  EntrepreneurPulse.com.au  Fact Sheet for Healthcare Providers:  IncredibleEmployment.be  This test is no t yet approved or cleared by the [place] FDA and  has been authorized for detection and/or diagnosis of SARS-CoV-2 by FDA under an Emergency Use Authorization (EUA). This EUA will remain  in effect (meaning this test can be used) for the duration of the COVID-19 declaration under Section 564(b)(1) of the Act, 21 U.S.C.section 360bbb-3(b)(1), unless the authorization is terminated   or revoked sooner.       Influenza A by PCR NEGATIVE NEGATIVE Final   Influenza B by PCR NEGATIVE NEGATIVE Final    Comment: (NOTE) The Xpert Xpress SARS-CoV-2/FLU/RSV plus assay is intended as an aid in the diagnosis of influenza from Nasopharyngeal swab specimens and should not be used as a sole basis for treatment. Nasal washings and aspirates are unacceptable for Xpert Xpress SARS-CoV-2/FLU/RSV testing.  Fact Sheet for Patients: EntrepreneurPulse.com.au  Fact Sheet for Healthcare Providers: IncredibleEmployment.be  This test is not yet approved or cleared by the [place] FDA and has been authorized for detection and/or diagnosis of SARS-CoV-2 by FDA under an Emergency Use Authorization (EUA). This EUA will remain in effect (meaning this test can be used) for the duration of the COVID-19 declaration under Section 564(b)(1) of the Act, 21 U.S.C. section 360bbb-3(b)(1), unless the authorization is terminated or revoked.  Performed at [facility], [place] [address] [place], [place] [address]   MRSA Next Gen by PCR, Nasal     Status: None   Collection Time: 01/15/21 10:31 AM   Specimen: Nasal Swab  Result Value Ref Range Status   MRSA by PCR Next Gen NOT DETECTED NOT DETECTED Final    Comment: (NOTE) The GeneXpert MRSA Assay (FDA approved for NASAL specimens only), is one component of a comprehensive MRSA colonization surveillance program. It is not intended to diagnose MRSA infection nor to guide or monitor treatment for MRSA infections. Test performance is not FDA approved in patients less than 56 years old. Performed at [facility], [place] [address], [place], [place] [address]   Culture, blood (routine x 2)     Status: None   Collection Time: 01/15/21 11:12 AM   Specimen: BLOOD  Result Value Ref Range Status   Specimen Description BLOOD RIGHT ANTECUBITAL  Final   Special Requests  IN PEDIATRIC BOTTLE Blood Culture  adequate volume  Final   Culture   Final    NO GROWTH 5 DAYS Performed at [facility], [place] [address] [place], [place] [address]    Report Status 01/20/2021 FINAL  Final  Culture, blood (routine x 2)     Status: None   Collection Time: 01/15/21 11:12 AM   Specimen: BLOOD  Result Value Ref Range Status   Specimen Description BLOOD LEFT ANTECUBITAL  Final   Special Requests IN PEDIATRIC BOTTLE Blood Culture adequate volume  Final   Culture   Final    NO GROWTH 5 DAYS Performed at [facility], [place] [address] [place], [place] [address]    Report Status 01/20/2021 FINAL  Final  Culture, Respiratory w Gram Stain     Status: None   Collection Time: 01/16/21  8:53 AM   Specimen: Tracheal Aspirate; Respiratory  Result Value Ref Range Status   Specimen Description TRACHEAL ASPIRATE  Final   Special Requests NONE  Final   Gram Stain   Final    MODERATE WBC PRESENT,BOTH PMN AND MONONUCLEAR RARE YEAST Performed at [facility], [address], [place], [place] [address]    Culture FEW KLEBSIELLA PNEUMONIAE FEW CANDIDA ALBICANS   Final   Report Status 01/18/2021 FINAL  Final   Organism ID, Bacteria KLEBSIELLA PNEUMONIAE  Final      Susceptibility   Klebsiella pneumoniae - MIC*    AMPICILLIN >=32 RESISTANT Resistant     CEFAZOLIN <=4 SENSITIVE Sensitive     CEFEPIME <=0.12 SENSITIVE Sensitive     CEFTAZIDIME <=1 SENSITIVE Sensitive     CEFTRIAXONE <=0.25 SENSITIVE Sensitive     CIPROFLOXACIN <=0.25 SENSITIVE Sensitive     GENTAMICIN <=1 SENSITIVE Sensitive     IMIPENEM <=0.25 SENSITIVE Sensitive     TRIMETH/SULFA >=320 RESISTANT Resistant     AMPICILLIN/SULBACTAM 8 SENSITIVE Sensitive     PIP/TAZO <=4 SENSITIVE Sensitive     * FEW KLEBSIELLA PNEUMONIAE       Radiology Studies: No results found.  Scheduled Meds:  amLODipine  10 mg Per Tube QHS   brimonidine  1 drop Both Eyes BID   chlorhexidine  15 mL Mouth Rinse BID   Chlorhexidine Gluconate Cloth  6  each Topical Daily   dorzolamide-timolol  1 drop Both Eyes BID   feeding supplement (PROSource TF)  45 mL Per Tube Daily   gabapentin  300 mg Per Tube TID   heparin injection (subcutaneous)  5,000 Units Subcutaneous Q8H   hydrALAZINE  25 mg Per Tube Q8H   hydrochlorothiazide  25 mg Per Tube Daily   insulin aspart  2-6 Units Subcutaneous Q4H   insulin glargine-yfgn  10 Units Subcutaneous Daily   latanoprost  1 drop Both Eyes QHS   mouth rinse  15 mL Mouth Rinse q12n4p   memantine  10 mg Per Tube Daily   QUEtiapine  150 mg Per Tube QHS   Continuous Infusions:  sodium chloride 250 mL (01/23/21 1819)   feeding supplement (OSMOLITE 1.2 CAL) 60 mL/hr at 01/24/21 0600     LOS: 9 days   Time spent: 30 minutes   Darliss Cheney, MD [facility]  01/24/2021, 10:40 AM  Please page via Shea [facility] and do not message via secure chat for anything urgent. Secure chat can be used for anything non urgent.  How to contact the [facility] Attending or Consulting provider [place] or covering provider during after hours  7P -7A, for this patient?  Check the care team in [facility] and look for a) attending/consulting [facility] provider listed and b) the [facility] team listed. Page or secure chat [phone]. Log into [email or web address] and use [place]'s universal password to access. If you do not have the password, please contact the hospital operator. Locate the [facility] provider you are looking for under [facility] and page to a number that you can be directly reached. If you still have difficulty reaching the provider, please page the [facility] (Director on Call) for the Hospitalists listed on amion for assistance.

## 2021-01-25 DIAGNOSIS — T50901A Poisoning by unspecified drugs, medicaments and biological substances, accidental (unintentional), initial encounter: Secondary | ICD-10-CM | POA: Diagnosis not present

## 2021-01-25 LAB — GLUCOSE, CAPILLARY
Glucose-Capillary: 142 mg/dL — ABNORMAL HIGH (ref 70–99)
Glucose-Capillary: 149 mg/dL — ABNORMAL HIGH (ref 70–99)
Glucose-Capillary: 158 mg/dL — ABNORMAL HIGH (ref 70–99)
Glucose-Capillary: 171 mg/dL — ABNORMAL HIGH (ref 70–99)
Glucose-Capillary: 175 mg/dL — ABNORMAL HIGH (ref 70–99)
Glucose-Capillary: 186 mg/dL — ABNORMAL HIGH (ref 70–99)

## 2021-01-25 LAB — CBC
HCT: 38.4 % (ref 36.0–46.0)
Hemoglobin: 12.7 g/dL (ref 12.0–15.0)
MCH: 29.7 pg (ref 26.0–34.0)
MCHC: 33.1 g/dL (ref 30.0–36.0)
MCV: 89.7 fL (ref 80.0–100.0)
Platelets: 392 10*3/uL (ref 150–400)
RBC: 4.28 MIL/uL (ref 3.87–5.11)
RDW: 13.2 % (ref 11.5–15.5)
WBC: 23.1 10*3/uL — ABNORMAL HIGH (ref 4.0–10.5)
nRBC: 0 % (ref 0.0–0.2)

## 2021-01-25 MED ORDER — GABAPENTIN 300 MG PO CAPS
300.0000 mg | ORAL_CAPSULE | Freq: Two times a day (BID) | ORAL | Status: DC
  Administered 2021-01-25 – 2021-01-26 (×2): 300 mg
  Filled 2021-01-25 (×2): qty 1

## 2021-01-25 MED ORDER — HALOPERIDOL 5 MG PO TABS
5.0000 mg | ORAL_TABLET | ORAL | Status: DC | PRN
  Filled 2021-01-25: qty 1

## 2021-01-25 MED ORDER — HYDRALAZINE HCL 25 MG PO TABS: 25.0000 mg | ORAL_TABLET | Freq: Four times a day (QID) | ORAL | Status: DC | PRN

## 2021-01-25 MED ORDER — CYCLOBENZAPRINE HCL 10 MG PO TABS
10.0000 mg | ORAL_TABLET | Freq: Two times a day (BID) | ORAL | Status: DC | PRN
  Administered 2021-01-25: 10 mg

## 2021-01-25 MED ORDER — HALOPERIDOL LACTATE 5 MG/ML IJ SOLN: 5.0000 mg | INTRAMUSCULAR | Status: DC | PRN

## 2021-01-25 NOTE — Progress Notes (Signed)
PROGRESS NOTE    DEVANSHI Ball  OEV:[id number] DOB: 1956/03/22 DOA: 01/15/2021 PCP: Arthur Holms, NP   Brief Narrative:  65 year old with extensive past medical history of GERD, CVA, DVT, hypertension, iron deficiency anemia, prediabetes, who was found on her porch by a neighbor early a.m. 01/15/2021 unresponsive EMS arrived to find PEA arrest she required intubation multiple rounds of epinephrine chest compressions and coded again in the emergency department x2.  Pulmonary critical care asked to admit, note she had drug paraphernalia around her, so this is presumed drug overdose.  She was admitted under ICU/PCCM and transferred to [facility] on 01/23/2021   Significant Hospital Events: Including procedures, antibiotic start and stop dates in addition to other pertinent events   01/15/2021 found down in PEA, on epi/ NE, re-arrest x 2 in ER, starting zosyn for aspiration pna, off pressors overnight 10/7 intermittently agitated, on precedex, prn versed, LTM d/c'd- neg seizure, MRI brain 10/12 Extubated yesterday, agitated requiring Precedex overnight    01/15/2021 CTH / cervical > no intracranial/ acute trauma findings to cervical, multilevel spinal degenerative changes greatest C5-6, dense consolidative changes at lung apices 01/15/2021 CTA chest/abd/ pelvis > neg dissection/ aneurysm, bilateral dependent lung consolidation, borderline to mild cardiomegaly, RCF venous catheter, minimally displaced anterior left 2nd rib fx 01/15/2021 lower extremity Doppler> negative 10/7 MRI brain >> non acute   10/6 SARS/ flu >neg 10/6 MRSA pcr > neg 10/6 Bcx2 >>neg 10/6 UC >>neg 10/7 trach asp >klebsiella pna   10/6 zosyn  10/7 unasyn >10/10  Assessment & Plan:   Active Problems:   Cardiac arrest ([place])   Respiratory failure (HCC)   Accidental drug overdose   Encephalopathy acute   History of ETT   Delirium  Acute encephalopathy, metabolic/ toxic and given multiple arrest, concern for anoxic injury.   Initial CT head and MRI brain neg.  EEG without seizues. Cocaine abuse: Precedex discontinued on 01/21/2021.  Patient with aphasia.  Tries to follow commands intermittently.  PEA cardiac arrest- unclear etiology, possibly secondary to cocaine vs hypokalemia. TTE with EF 60-65%, normal RV, valves ok, no wall motion abnormalities. LE duplex neg and normal RV on TTE, very low risk of PE.  Continue supportive care.  Acute hypoxic respiratory insufficiency in the setting of cardiac arrest and possible aspiration pneumonia: Extubated on 01/20/2021.  Currently on room air.  Completed course of Unasyn.   Transaminitis: suspect from post arrest shock liver.  Improving.  Hyperglycemia: Controlled.  Continue Semglee 10 units and continue SSI.  Dysphagia/nutrition: Nutrition per NG tube.  SLP following.  Remains NPO.  Failing swallow.  Essential hypertension: Controlled, continue amlodipine 10 mg daily, hydrochlorothiazide 25 mg and hydralazine 25 mg every 8 hours.  DVT prophylaxis: heparin injection 5,000 Units Start: 01/16/21 1400 SCDs Start: 01/15/21 0735   Code Status: Full Code  Family Communication: None present at bedside.   Status is: Inpatient  Remains inpatient appropriate because: Needs inpatient hospitalization.  Estimated body mass index is 34.44 kg/m as calculated from the following:   Height as of this encounter: 5\' 2"  (1.575 m).   Weight as of this encounter: 85.4 kg.  Nutritional Assessment: Body mass index is 34.44 kg/m.Marland Kitchen Seen by dietician.  I agree with the assessment and plan as outlined below: Nutrition Status: Nutrition Problem: Inadequate oral intake Etiology: acute illness Signs/Symptoms: NPO status Interventions: MVI, Tube feeding  .  Skin Assessment: I have examined the patient's skin and I agree with the wound assessment as performed by the wound care  RN as outlined below:    Consultants:  None  Procedures:  None  Antimicrobials:  Anti-infectives  (From admission, onward)    Start     Dose/Rate Route Frequency Ordered Stop   01/19/21 1145  cefTRIAXone (ROCEPHIN) 2 g in sodium chloride 0.9 % 100 mL IVPB  Status:  Discontinued        2 g 200 mL/hr over 30 Minutes Intravenous Every 24 hours 01/19/21 1052 01/19/21 1052   01/16/21 1400  Ampicillin-Sulbactam (UNASYN) 3 g in sodium chloride 0.9 % 100 mL IVPB  Status:  Discontinued        3 g 200 mL/hr over 30 Minutes Intravenous Every 6 hours 01/16/21 0816 01/19/21 1052   01/15/21 1400  piperacillin-tazobactam (ZOSYN) IVPB 3.375 g  Status:  Discontinued        3.375 g 12.5 mL/hr over 240 Minutes Intravenous Every 8 hours 01/15/21 1104 01/16/21 0816   01/15/21 0800  Ampicillin-Sulbactam (UNASYN) 3 g in sodium chloride 0.9 % 100 mL IVPB  Status:  Discontinued        3 g 200 mL/hr over 30 Minutes Intravenous Every 6 hours 01/15/21 0652 01/15/21 1104          Subjective: Patient seen and examined.  No change.  Too lethargic to open her eyes and follow commands today.  Looks comfortable though.  Objective: Vitals:   01/24/21 1743 01/24/21 2037 01/25/21 0458 01/25/21 1135  BP: (!) 175/93 (!) 151/93 (!) 152/75 134/66  Pulse: (!) 101 98 100 97  Resp: 18 20 20 19   Temp: 98.1 F (36.7 C) 98.4 F (36.9 C) 98.3 F (36.8 C) 99 F (37.2 C)  TempSrc: Oral Oral Oral Oral  SpO2: 97% 100% 99% 94%  Weight:      Height:        Intake/Output Summary (Last 24 hours) at 01/25/2021 1346 Last data filed at 01/24/2021 1600 Gross per 24 hour  Intake --  Output 100 ml  Net -100 ml    Filed Weights   01/22/21 0100 01/23/21 0500 01/24/21 0500  Weight: 87.1 kg 85.4 kg 85.4 kg    Examination:  General exam: Appears calm and comfortable  Respiratory system: Clear to auscultation. Respiratory effort normal. Cardiovascular system: S1 & S2 heard, RRR. No JVD, murmurs, rubs, gallops or clicks. No pedal edema. Gastrointestinal system: Abdomen is nondistended, soft and nontender. No organomegaly  or masses felt. Normal bowel sounds heard. Central nervous system: Lethargic today.  Data Reviewed: I have personally reviewed following labs and imaging studies  CBC: Recent Labs  Lab 01/21/21 0250 01/22/21 0105 01/23/21 0330 01/24/21 0229 01/25/21 0313  WBC 13.6* 13.8* 13.7* 17.2* 23.1*  NEUTROABS  --   --   --  13.1*  --   HGB 12.5 14.2 12.8 13.1 12.7  HCT 38.9 43.3 39.3 39.2 38.4  MCV 92.0 90.2 91.4 89.5 89.7  PLT 264 280 336 380 539    Basic Metabolic Panel: Recent Labs  Lab 01/19/21 1216 01/20/21 0031 01/21/21 0250 01/22/21 0105 01/23/21 0330 01/25/21 0313  NA 140 138 139 137 132* 131*  K 3.6 4.2 4.1 3.9 3.8 5.1  CL 108 104 100 101 100 99  CO2 25 26 27 22 22  19*  GLUCOSE 160* 128* 123* 119* 172* 166*  BUN 13 14 13 17  25* 66*  CREATININE 0.67 0.60 0.68 0.68 0.82 2.39*  CALCIUM 8.8* 8.8* 9.3 9.5 9.4 9.1  MG 1.8 1.9 2.1 QUANTITY NOT SUFFICIENT, UNABLE TO PERFORM TEST 2.0  --  PHOS 2.4* 3.6 3.3 3.6 4.4  --     GFR: Estimated Creatinine Clearance: 24.4 mL/min (A) (by C-G formula based on SCr of 2.39 mg/dL (H)). Liver Function Tests: Recent Labs  Lab 01/19/21 1216 01/20/21 0031 01/21/21 0250 01/22/21 0105 01/23/21 0330  AST 31 31 54* 97* 49*  ALT 126* 109* 112* 146* 121*  ALKPHOS 51 49 60 65 69  BILITOT 0.5 0.6 0.9 QUANTITY NOT SUFFICIENT, UNABLE TO PERFORM TEST 0.5  PROT 6.2* 6.0* 6.9 7.6 7.0  ALBUMIN 2.6* 2.5* 2.8* 2.9* 2.9*    No results for input(s): LIPASE, AMYLASE in the last 168 hours. Recent Labs  Lab 01/19/21 1216  AMMONIA 45*    Coagulation Profile: No results for input(s): INR, PROTIME in the last 168 hours. Cardiac Enzymes: No results for input(s): CKTOTAL, CKMB, CKMBINDEX, TROPONINI in the last 168 hours. BNP (last 3 results) No results for input(s): PROBNP in the last 8760 hours. HbA1C: No results for input(s): HGBA1C in the last 72 hours. CBG: Recent Labs  Lab 01/24/21 1621 01/24/21 2029 01/25/21 0014 01/25/21 0417  01/25/21 1138  GLUCAP 175* 136* 175* 186* 158*    Lipid Profile: No results for input(s): CHOL, HDL, LDLCALC, TRIG, CHOLHDL, LDLDIRECT in the last 72 hours. Thyroid Function Tests: No results for input(s): TSH, T4TOTAL, FREET4, T3FREE, THYROIDAB in the last 72 hours. Anemia Panel: No results for input(s): VITAMINB12, FOLATE, FERRITIN, TIBC, IRON, RETICCTPCT in the last 72 hours. Sepsis Labs: Recent Labs  Lab 01/23/21 0330  PROCALCITON <0.10     Recent Results (from the past 240 hour(s))  Culture, Respiratory w Gram Stain     Status: None   Collection Time: 01/16/21  8:53 AM   Specimen: Tracheal Aspirate; Respiratory  Result Value Ref Range Status   Specimen Description TRACHEAL ASPIRATE  Final   Special Requests NONE  Final   Gram Stain   Final    MODERATE WBC PRESENT,BOTH PMN AND MONONUCLEAR RARE YEAST Performed at [facility], [address] [place], [place] [address]    Culture FEW KLEBSIELLA PNEUMONIAE FEW CANDIDA ALBICANS   Final   Report Status 01/18/2021 FINAL  Final   Organism ID, Bacteria KLEBSIELLA PNEUMONIAE  Final      Susceptibility   Klebsiella pneumoniae - MIC*    AMPICILLIN >=32 RESISTANT Resistant     CEFAZOLIN <=4 SENSITIVE Sensitive     CEFEPIME <=0.12 SENSITIVE Sensitive     CEFTAZIDIME <=1 SENSITIVE Sensitive     CEFTRIAXONE <=0.25 SENSITIVE Sensitive     CIPROFLOXACIN <=0.25 SENSITIVE Sensitive     GENTAMICIN <=1 SENSITIVE Sensitive     IMIPENEM <=0.25 SENSITIVE Sensitive     TRIMETH/SULFA >=320 RESISTANT Resistant     AMPICILLIN/SULBACTAM 8 SENSITIVE Sensitive     PIP/TAZO <=4 SENSITIVE Sensitive     * FEW KLEBSIELLA PNEUMONIAE       Radiology Studies: No results found.  Scheduled Meds:  amLODipine  10 mg Per Tube QHS   brimonidine  1 drop Both Eyes BID   carvedilol  3.125 mg Oral BID WC   chlorhexidine  15 mL Mouth Rinse BID   Chlorhexidine Gluconate Cloth  6 each Topical Daily   dorzolamide-timolol  1 drop Both Eyes BID    feeding supplement (PROSource TF)  45 mL Per Tube Daily   gabapentin  300 mg Per Tube BID   heparin injection (subcutaneous)  5,000 Units Subcutaneous Q8H   hydrALAZINE  25 mg Per Tube Q8H   hydrochlorothiazide  25 mg Per Tube Daily   insulin aspart  2-6 Units Subcutaneous Q4H   insulin glargine-yfgn  10 Units Subcutaneous Daily   latanoprost  1 drop Both Eyes QHS   mouth rinse  15 mL Mouth Rinse q12n4p   memantine  10 mg Per Tube Daily   QUEtiapine  150 mg Per Tube QHS   Continuous Infusions:  sodium chloride 250 mL (01/23/21 1819)   feeding supplement (OSMOLITE 1.2 CAL) 60 mL/hr at 01/24/21 0600     LOS: 10 days   Time spent: 28 minutes   Darliss Cheney, MD [facility]  01/25/2021, 1:46 PM  Please page via [facility] and do not message via secure chat for anything urgent. Secure chat can be used for anything non urgent.  How to contact the [facility] Attending or Consulting provider [place] or covering provider during after hours [place], for this patient?  Check the care team in [facility] and look for a) attending/consulting TRH provider listed and b) the [facility] team listed. Page or secure chat [phone]. Log into [email or web address] and use [place]'s universal password to access. If you do not have the password, please contact the hospital operator. Locate the [facility] provider you are looking for under [facility] and page to a number that you can be directly reached. If you still have difficulty reaching the provider, please page the [facility] (Director on Call) for the Hospitalists listed on amion for assistance.

## 2021-01-26 DIAGNOSIS — I469 Cardiac arrest, cause unspecified: Secondary | ICD-10-CM | POA: Diagnosis not present

## 2021-01-26 DIAGNOSIS — G8250 Quadriplegia, unspecified: Secondary | ICD-10-CM | POA: Diagnosis not present

## 2021-01-26 DIAGNOSIS — T50901A Poisoning by unspecified drugs, medicaments and biological substances, accidental (unintentional), initial encounter: Secondary | ICD-10-CM | POA: Diagnosis not present

## 2021-01-26 DIAGNOSIS — T405X1A Poisoning by cocaine, accidental (unintentional), initial encounter: Secondary | ICD-10-CM | POA: Diagnosis not present

## 2021-01-26 DIAGNOSIS — G9340 Encephalopathy, unspecified: Secondary | ICD-10-CM | POA: Diagnosis not present

## 2021-01-26 DIAGNOSIS — Z66 Do not resuscitate: Secondary | ICD-10-CM | POA: Diagnosis not present

## 2021-01-26 DIAGNOSIS — Z7189 Other specified counseling: Secondary | ICD-10-CM

## 2021-01-26 DIAGNOSIS — J9601 Acute respiratory failure with hypoxia: Secondary | ICD-10-CM | POA: Diagnosis not present

## 2021-01-26 DIAGNOSIS — Z515 Encounter for palliative care: Secondary | ICD-10-CM

## 2021-01-26 LAB — GLUCOSE, CAPILLARY
Glucose-Capillary: 132 mg/dL — ABNORMAL HIGH (ref 70–99)
Glucose-Capillary: 154 mg/dL — ABNORMAL HIGH (ref 70–99)
Glucose-Capillary: 146 mg/dL — ABNORMAL HIGH (ref 70–99)
Glucose-Capillary: 113 mg/dL — ABNORMAL HIGH (ref 70–99)
Glucose-Capillary: 80 mg/dL (ref 70–99)

## 2021-01-26 LAB — BASIC METABOLIC PANEL
Anion gap: 13 (ref 5–15)
Anion gap: 14 (ref 5–15)
BUN: 66 mg/dL — ABNORMAL HIGH (ref 8–23)
BUN: 101 mg/dL — ABNORMAL HIGH (ref 8–23)
CO2: 19 mmol/L — ABNORMAL LOW (ref 22–32)
CO2: 20 mmol/L — ABNORMAL LOW (ref 22–32)
Calcium: 9.1 mg/dL (ref 8.9–10.3)
Calcium: 9.1 mg/dL (ref 8.9–10.3)
Chloride: 99 mmol/L (ref 98–111)
Chloride: 96 mmol/L — ABNORMAL LOW (ref 98–111)
Creatinine, Ser: 2.39 mg/dL — ABNORMAL HIGH (ref 0.44–1.00)
Creatinine, Ser: 4.67 mg/dL — ABNORMAL HIGH (ref 0.44–1.00)
GFR, Estimated: 22 mL/min — ABNORMAL LOW (ref >60)
GFR, Estimated: 10 mL/min — ABNORMAL LOW (ref >60)
Glucose, Bld: 166 mg/dL — ABNORMAL HIGH (ref 70–99)
Glucose, Bld: 156 mg/dL — ABNORMAL HIGH (ref 70–99)
Potassium: 5.1 mmol/L (ref 3.5–5.1)
Potassium: 6.2 mmol/L — ABNORMAL HIGH (ref 3.5–5.1)
Sodium: 131 mmol/L — ABNORMAL LOW (ref 135–145)
Sodium: 130 mmol/L — ABNORMAL LOW (ref 135–145)

## 2021-01-26 LAB — CBC
HCT: 34.8 % — ABNORMAL LOW (ref 36.0–46.0)
Hemoglobin: 11.6 g/dL — ABNORMAL LOW (ref 12.0–15.0)
MCH: 30.2 pg (ref 26.0–34.0)
MCHC: 33.3 g/dL (ref 30.0–36.0)
MCV: 90.6 fL (ref 80.0–100.0)
Platelets: 342 10*3/uL (ref 150–400)
RBC: 3.84 MIL/uL — ABNORMAL LOW (ref 3.87–5.11)
RDW: 13.2 % (ref 11.5–15.5)
WBC: 20.4 10*3/uL — ABNORMAL HIGH (ref 4.0–10.5)
nRBC: 0 % (ref 0.0–0.2)

## 2021-01-26 MED ORDER — SODIUM ZIRCONIUM CYCLOSILICATE 10 G PO PACK
10.0000 g | PACK | Freq: Two times a day (BID) | ORAL | Status: AC
  Administered 2021-01-26 (×2): 10 g
  Filled 2021-01-26 (×2): qty 1

## 2021-01-26 MED ORDER — DEXTROSE 50 % IV SOLN
1.0000 | Freq: Once | INTRAVENOUS | Status: AC
  Administered 2021-01-26: 50 mL via INTRAVENOUS
  Filled 2021-01-26: qty 50

## 2021-01-26 MED ORDER — GABAPENTIN 300 MG PO CAPS
300.0000 mg | ORAL_CAPSULE | Freq: Every day | ORAL | Status: DC
  Administered 2021-01-27: 300 mg
  Filled 2021-01-26: qty 1

## 2021-01-26 MED ORDER — INSULIN ASPART 100 UNIT/ML IV SOLN
10.0000 [IU] | Freq: Once | INTRAVENOUS | Status: AC
  Administered 2021-01-26: 10 [IU] via INTRAVENOUS

## 2021-01-26 MED ORDER — SODIUM CHLORIDE 0.9 % IV SOLN: INTRAVENOUS | Status: DC

## 2021-01-26 MED ORDER — HYDRALAZINE HCL 50 MG PO TABS
50.0000 mg | ORAL_TABLET | Freq: Three times a day (TID) | ORAL | Status: DC
  Administered 2021-01-26 – 2021-01-28 (×6): 50 mg
  Filled 2021-01-26 (×6): qty 1

## 2021-01-26 NOTE — Progress Notes (Signed)
Speech Language Pathology Treatment: Dysphagia  Patient Details Name: Alexis Ball MRN: [id number] DOB: 1955-12-15 Today's Date: 01/26/2021 Time: 1281-1886 SLP Time Calculation (min) (ACUTE ONLY): 17 min  Assessment / Plan / Recommendation Clinical Impression  Pt seen for ongoing dysphagia treatment.  On arrival pt with congested breath sounds.  Pt slumped in bed to L side.  RN assisted with repositioning.  SLP provided oral care.  Pt brought up a large large amount of thick, sticky, tan/greenish substance.  RN messaged MD.  SLP placed tube feeds on hold.  Pt expectorated ice chip trial and 1 of 2 trials of thin liquid. No swallow reflex was palpated for remaining trial and suction was used to remove any remaining liquid.    Recommend pt remain NPO with alternate means of nutrition, hydration, and medication.      HPI HPI: Pt is a 65 y.o.F admitted to ICU 01/15/2021 who was found unresponsive on her porch with PEA. She required intubation, multiple rounds of epinephrine, chest compressions and coded again in the ED x 2. Pt intubated 10/6-10/11. Pt also with acute encephalopathy, concern for anoxic injury; initial CT head and MRI brain neg. Significant PMH: recent cocaine use, CVA without residual deficits, vascular dementia.      SLP Plan  Continue with current plan of care      Recommendations for follow up therapy are one component of a multi-disciplinary discharge planning process, led by the attending physician.  Recommendations may be updated based on patient status, additional functional criteria and insurance authorization.    Recommendations  Diet recommendations: NPO Medication Administration: Via alternative means                Oral Care Recommendations: Oral care QID Follow up Recommendations: Skilled Nursing facility SLP Visit Diagnosis: Dysphagia, unspecified (R13.10) Plan: Continue with current plan of care       GO                Alexis Ball  Marquavion Venhuizen  01/26/2021, 12:44 PM

## 2021-01-26 NOTE — TOC Progression Note (Signed)
Transition of Care [facility]) - Progression Note    Patient Details  Name: Alexis Ball MRN: [id number] Date of Birth: [birth year]  [facility] CM/SW Contact  Zenon Mayo, RN Phone Number: 01/26/2021, 8:21 PM  Clinical Narrative:    Patient is transferred from [facility], from home, cardiac arrest , resp failure, accidental DO, Encephalopathy, positive PSA, declining, cret doubled over night, palliative consulted , has cortrak tube feeds. Family on board with speaking with palliative.  Await Palliative Recs. TOC will continue to follow for dc needs.   Expected Discharge Plan: [place] Barriers to Discharge: Continued Medical Work up  Expected Discharge Plan and Services Expected Discharge Plan: [place] In-house Referral: Clinical Social Work     Living arrangements for the past 2 months: Single Family Home                                       Social Determinants of Health (SDOH) Interventions    Readmission Risk Interventions No flowsheet data found.

## 2021-01-26 NOTE — Progress Notes (Addendum)
PROGRESS NOTE    Alexis Ball  HER:[id number] DOB: [birth year] DOA: 01/15/2021 PCP: Arthur Holms, NP   Brief Narrative:  65 year old with extensive past medical history of GERD, CVA, DVT, hypertension, iron deficiency anemia, prediabetes, who was found on her porch by a neighbor early a.m. 01/15/2021 unresponsive EMS arrived to find PEA arrest she required intubation multiple rounds of epinephrine chest compressions and coded again in the emergency department x2.  Pulmonary critical care asked to admit, note she had drug paraphernalia around her, so this is presumed drug overdose.  She was admitted under ICU/PCCM and transferred to [facility] on 01/23/2021   Significant Hospital Events: Including procedures, antibiotic start and stop dates in addition to other pertinent events   01/15/2021 found down in PEA, on epi/ NE, re-arrest x 2 in ER, starting zosyn for aspiration pna, off pressors overnight 10/7 intermittently agitated, on precedex, prn versed, LTM d/c'd- neg seizure, MRI brain 10/12 Extubated yesterday, agitated requiring Precedex overnight    01/15/2021 CTH / cervical > no intracranial/ acute trauma findings to cervical, multilevel spinal degenerative changes greatest C5-6, dense consolidative changes at lung apices 01/15/2021 CTA chest/abd/ pelvis > neg dissection/ aneurysm, bilateral dependent lung consolidation, borderline to mild cardiomegaly, RCF venous catheter, minimally displaced anterior left 2nd rib fx 01/15/2021 lower extremity Doppler> negative 10/7 MRI brain >> non acute   10/6 SARS/ flu >neg 10/6 MRSA pcr > neg 10/6 Bcx2 >>neg 10/6 UC >>neg 10/7 trach asp >klebsiella pna   10/6 zosyn  10/7 unasyn >10/10  Assessment & Plan:   Active Problems:   Cardiac arrest ([place])   Respiratory failure (HCC)   Accidental drug overdose   Encephalopathy acute   History of ETT   Delirium  Acute encephalopathy, metabolic/ toxic and given multiple arrest, concern for anoxic injury.   Initial CT head and MRI brain neg.  EEG without seizues. Cocaine abuse: Precedex discontinued on 01/21/2021.  Patient with aphasia.  She is not following any commands today but she is moving her extremities intermittently.  PEA cardiac arrest- unclear etiology, possibly secondary to cocaine vs hypokalemia. TTE with EF 60-65%, normal RV, valves ok, no wall motion abnormalities. LE duplex neg and normal RV on TTE, very low risk of PE.  Continue supportive care.  Acute hypoxic respiratory insufficiency in the setting of cardiac arrest and possible aspiration pneumonia: Extubated on 01/20/2021.  Currently on room air.  Completed course of Unasyn.   Transaminitis: suspect from post arrest shock liver.  Improving.  Hyperglycemia: Controlled.  Continue Semglee 10 units and continue SSI.  Dysphagia/nutrition: Nutrition per NG tube.  SLP following.  Remains NPO.  Failing swallow.  Hyperkalemia: 6.1.  Will try both Lokelma, dextrose and IV insulin.  Recheck in the morning.  Acute renal failure: Rapid decline in the kidney function with creatinine over 4 today.  No urine output has been charted.  Normal saline at 125 cc/h.  Avoid nephrotoxic agents.  Discontinue hydrochlorothiazide.  Essential hypertension: Controlled, continue amlodipine 10 mg daily, increase hydralazine to 50 mg every 8 hours, discontinue Coreg and hydrochlorothiazide.  Goal of care/significant deconditioning: Patient extremely dependent with quadriparesis.  Chances of recovery very slim if there is any.  I had a frank discussion with patient's son and daughter-in-law who were at the bedside.  Son did not talk at all, all the talking was done by daughter-in-law who seems to be very reasonable person.  We also broke the discussion of hospice.  I encouraged DNR but at this point in  time they were not able to make any decision and would rather like to keep her full code.  She seemed somewhat interested and hospice at home.  She is interested  in talking to palliative care.  I have consulted palliative care.  DVT prophylaxis: heparin injection 5,000 Units Start: 01/16/21 1400 SCDs Start: 01/15/21 0735   Code Status: Full Code  Family Communication: Daughter-in-law and son present at the bedside.  Status is: Inpatient  Remains inpatient appropriate because: Needs inpatient hospitalization.  Estimated body mass index is 34.64 kg/m as calculated from the following:   Height as of this encounter: 5\' 2"  (1.575 m).   Weight as of this encounter: 85.9 kg.  Nutritional Assessment: Body mass index is 34.64 kg/m.Marland Kitchen Seen by dietician.  I agree with the assessment and plan as outlined below: Nutrition Status: Nutrition Problem: Inadequate oral intake Etiology: acute illness Signs/Symptoms: NPO status Interventions: MVI, Tube feeding  .  Skin Assessment: I have examined the patient's skin and I agree with the wound assessment as performed by the wound care RN as outlined below:    Consultants:  None  Procedures:  None  Antimicrobials:  Anti-infectives (From admission, onward)    Start     Dose/Rate Route Frequency Ordered Stop   01/19/21 1145  cefTRIAXone (ROCEPHIN) 2 g in sodium chloride 0.9 % 100 mL IVPB  Status:  Discontinued        2 g 200 mL/hr over 30 Minutes Intravenous Every 24 hours 01/19/21 1052 01/19/21 1052   01/16/21 1400  Ampicillin-Sulbactam (UNASYN) 3 g in sodium chloride 0.9 % 100 mL IVPB  Status:  Discontinued        3 g 200 mL/hr over 30 Minutes Intravenous Every 6 hours 01/16/21 0816 01/19/21 1052   01/15/21 1400  piperacillin-tazobactam (ZOSYN) IVPB 3.375 g  Status:  Discontinued        3.375 g 12.5 mL/hr over 240 Minutes Intravenous Every 8 hours 01/15/21 1104 01/16/21 0816   01/15/21 0800  Ampicillin-Sulbactam (UNASYN) 3 g in sodium chloride 0.9 % 100 mL IVPB  Status:  Discontinued        3 g 200 mL/hr over 30 Minutes Intravenous Every 6 hours 01/15/21 0652 01/15/21 1104           Subjective: Patient seen and examined.  No change compared to yesterday.  Seemed to have gurgling sound with a lot of upper airway secretions.  Objective: Vitals:   01/25/21 1135 01/25/21 1926 01/26/21 0432 01/26/21 0709  BP: 134/66 (!) 150/80 136/64 130/75  Pulse: 97 89 92 87  Resp: 19 20 20 20   Temp: 99 F (37.2 C) 98 F (36.7 C) 98.7 F (37.1 C) 98.3 F (36.8 C)  TempSrc: Oral Oral Oral Oral  SpO2: 94% 95% 93% 93%  Weight:   85.9 kg   Height:        Intake/Output Summary (Last 24 hours) at 01/26/2021 0758 Last data filed at 01/26/2021 0657 Gross per 24 hour  Intake 0 ml  Output --  Net 0 ml    Filed Weights   01/23/21 0500 01/24/21 0500 01/26/21 0432  Weight: 85.4 kg 85.4 kg 85.9 kg    Examination:  General exam: Appears calm and comfortable  Respiratory system: Clear to auscultation with upper airway sounds. Respiratory effort normal. Cardiovascular system: S1 & S2 heard, RRR. No JVD, murmurs, rubs, gallops or clicks. No pedal edema. Gastrointestinal system: Abdomen is nondistended, soft and nontender. No organomegaly or masses felt. Normal bowel sounds  heard. Central nervous system: Alert but not oriented.  Aphasic with quadriparesis.  Data Reviewed: I have personally reviewed following labs and imaging studies  CBC: Recent Labs  Lab 01/22/21 0105 01/23/21 0330 01/24/21 0229 01/25/21 0313 01/26/21 0345  WBC 13.8* 13.7* 17.2* 23.1* 20.4*  NEUTROABS  --   --  13.1*  --   --   HGB 14.2 12.8 13.1 12.7 11.6*  HCT 43.3 39.3 39.2 38.4 34.8*  MCV 90.2 91.4 89.5 89.7 90.6  PLT 280 336 380 392 784    Basic Metabolic Panel: Recent Labs  Lab 01/19/21 1216 01/20/21 0031 01/21/21 0250 01/22/21 0105 01/23/21 0330 01/25/21 0313 01/26/21 0345  NA 140 138 139 137 132* 131* 130*  K 3.6 4.2 4.1 3.9 3.8 5.1 6.2*  CL 108 104 100 101 100 99 96*  CO2 25 26 27 22 22  19* 20*  GLUCOSE 160* 128* 123* 119* 172* 166* 156*  BUN 13 14 13 17  25* 66* 101*   CREATININE 0.67 0.60 0.68 0.68 0.82 2.39* 4.67*  CALCIUM 8.8* 8.8* 9.3 9.5 9.4 9.1 9.1  MG 1.8 1.9 2.1 QUANTITY NOT SUFFICIENT, UNABLE TO PERFORM TEST 2.0  --   --   PHOS 2.4* 3.6 3.3 3.6 4.4  --   --     GFR: Estimated Creatinine Clearance: 12.5 mL/min (A) (by C-G formula based on SCr of 4.67 mg/dL (H)). Liver Function Tests: Recent Labs  Lab 01/19/21 1216 01/20/21 0031 01/21/21 0250 01/22/21 0105 01/23/21 0330  AST 31 31 54* 97* 49*  ALT 126* 109* 112* 146* 121*  ALKPHOS 51 49 60 65 69  BILITOT 0.5 0.6 0.9 QUANTITY NOT SUFFICIENT, UNABLE TO PERFORM TEST 0.5  PROT 6.2* 6.0* 6.9 7.6 7.0  ALBUMIN 2.6* 2.5* 2.8* 2.9* 2.9*    No results for input(s): LIPASE, AMYLASE in the last 168 hours. Recent Labs  Lab 01/19/21 1216  AMMONIA 45*    Coagulation Profile: No results for input(s): INR, PROTIME in the last 168 hours. Cardiac Enzymes: No results for input(s): CKTOTAL, CKMB, CKMBINDEX, TROPONINI in the last 168 hours. BNP (last 3 results) No results for input(s): PROBNP in the last 8760 hours. HbA1C: No results for input(s): HGBA1C in the last 72 hours. CBG: Recent Labs  Lab 01/25/21 1614 01/25/21 1928 01/25/21 2352 01/26/21 0417 01/26/21 0708  GLUCAP 142* 171* 149* 132* 154*    Lipid Profile: No results for input(s): CHOL, HDL, LDLCALC, TRIG, CHOLHDL, LDLDIRECT in the last 72 hours. Thyroid Function Tests: No results for input(s): TSH, T4TOTAL, FREET4, T3FREE, THYROIDAB in the last 72 hours. Anemia Panel: No results for input(s): VITAMINB12, FOLATE, FERRITIN, TIBC, IRON, RETICCTPCT in the last 72 hours. Sepsis Labs: Recent Labs  Lab 01/23/21 0330  PROCALCITON <0.10     Recent Results (from the past 240 hour(s))  Culture, Respiratory w Gram Stain     Status: None   Collection Time: 01/16/21  8:53 AM   Specimen: Tracheal Aspirate; Respiratory  Result Value Ref Range Status   Specimen Description TRACHEAL ASPIRATE  Final   Special Requests NONE  Final    Gram Stain   Final    MODERATE WBC PRESENT,BOTH PMN AND MONONUCLEAR RARE YEAST Performed at [facility], [address] [place], [place] [address]    Culture FEW KLEBSIELLA PNEUMONIAE FEW CANDIDA ALBICANS   Final   Report Status 01/18/2021 FINAL  Final   Organism ID, Bacteria KLEBSIELLA PNEUMONIAE  Final      Susceptibility   Klebsiella pneumoniae -  MIC*    AMPICILLIN >=32 RESISTANT Resistant     CEFAZOLIN <=4 SENSITIVE Sensitive     CEFEPIME <=0.12 SENSITIVE Sensitive     CEFTAZIDIME <=1 SENSITIVE Sensitive     CEFTRIAXONE <=0.25 SENSITIVE Sensitive     CIPROFLOXACIN <=0.25 SENSITIVE Sensitive     GENTAMICIN <=1 SENSITIVE Sensitive     IMIPENEM <=0.25 SENSITIVE Sensitive     TRIMETH/SULFA >=320 RESISTANT Resistant     AMPICILLIN/SULBACTAM 8 SENSITIVE Sensitive     PIP/TAZO <=4 SENSITIVE Sensitive     * FEW KLEBSIELLA PNEUMONIAE       Radiology Studies: No results found.  Scheduled Meds:  amLODipine  10 mg Per Tube QHS   brimonidine  1 drop Both Eyes BID   carvedilol  3.125 mg Oral BID WC   chlorhexidine  15 mL Mouth Rinse BID   Chlorhexidine Gluconate Cloth  6 each Topical Daily   insulin aspart  10 Units Intravenous Once   And   dextrose  1 ampule Intravenous Once   dorzolamide-timolol  1 drop Both Eyes BID   feeding supplement (PROSource TF)  45 mL Per Tube Daily   gabapentin  300 mg Per Tube BID   heparin injection (subcutaneous)  5,000 Units Subcutaneous Q8H   hydrALAZINE  25 mg Per Tube Q8H   hydrochlorothiazide  25 mg Per Tube Daily   insulin aspart  2-6 Units Subcutaneous Q4H   insulin glargine-yfgn  10 Units Subcutaneous Daily   latanoprost  1 drop Both Eyes QHS   mouth rinse  15 mL Mouth Rinse q12n4p   memantine  10 mg Per Tube Daily   QUEtiapine  150 mg Per Tube QHS   sodium zirconium cyclosilicate  10 g Per Tube BID   Continuous Infusions:  sodium chloride 250 mL (01/23/21 1819)   feeding supplement (OSMOLITE 1.2 CAL) 60 mL/hr at 01/24/21  0600     LOS: 11 days   Time spent: 32 minutes   Darliss Cheney, MD [facility]  01/26/2021, 7:58 AM  Please page via [facility] and do not message via secure chat for anything urgent. Secure chat can be used for anything non urgent.  How to contact the [facility] Attending or Consulting provider [facility] [place] or covering provider during after hours [place], for this patient?  Check the care team in [facility] and look for a) attending/consulting [facility] provider listed and b) the [facility] team listed. Page or secure chat [phone]. Log into [email or web address] and use [place]'s universal password to access. If you do not have the password, please contact the hospital operator. Locate the [facility] provider you are looking for under [facility] and page to a number that you can be directly reached. If you still have difficulty reaching the provider, please page the [facility] (Director on Call) for the Hospitalists listed on amion for assistance.

## 2021-01-26 NOTE — Progress Notes (Signed)
Physical Therapy Treatment Patient Details Name: Alexis Ball MRN: [id number] DOB: [birth year] Today's Date: 01/26/2021   History of Present Illness Pt is a 65 y.o.F admitted to ICU 01/15/2021 who was found unresponsive on her porch with PEA. She required intubation, multiple rounds of epinephrine, chest compressions and coded again in the ED x 2. Pt intubated 10/6-10/11. Pt also with acute encephalopathy, concern for anoxic injury; initial CT head and MRI brain neg. Significant PMH: drug use, CVA without residual deficits, vascular dementia.    PT Comments    Pt was up to side of bed today and able to assist with more trunk control.  Her head control went from max to min guard at one point, and trunk was at times for brief moments min guard.  Pt is fatigued with the effort, returned to bed and assisted her to sit high on the bed.  Nursing in to get her repositioned, pt is calm and goes to sleep.  Follow acute PT goals for therapy, consider CIR next session.   Recommendations for follow up therapy are one component of a multi-disciplinary discharge planning process, led by the attending physician.  Recommendations may be updated based on patient status, additional functional criteria and insurance authorization.  Follow Up Recommendations  SNF     Equipment Recommendations  None recommended by PT    Recommendations for Other Services       Precautions / Restrictions Precautions Precautions: Fall;Other (comment) Precaution Comments: cortrak, mitts, foley Restrictions Weight Bearing Restrictions: No     Mobility  Bed Mobility Overal bed mobility: Needs Assistance Bed Mobility: Supine to Sit;Sit to Supine     Supine to sit: Total assist Sit to supine: Total assist   General bed mobility comments: total assist to sit up and return to bed but nursing in to help pull up in bed    Transfers                 General transfer comment: unable  Ambulation/Gait              General Gait Details: unable   Stairs             Wheelchair Mobility    Modified Rankin (Stroke Patients Only)       Balance Overall balance assessment: Needs assistance Sitting-balance support: Feet supported;Single extremity supported;Bilateral upper extremity supported Sitting balance-Leahy Scale: Poor Sitting balance - Comments: pt was assisted with minor cues eventually to control sit balance, to hold up head and shift wgt in sitting Postural control: Other (comment);Posterior lean;Right lateral lean;Left lateral lean (leaning forward at times, required redirection)                                  Cognition Arousal/Alertness: Awake/alert;Lethargic Behavior During Therapy: Flat affect;Restless Overall Cognitive Status: Impaired/Different from baseline Area of Impairment: Attention;Following commands;Awareness;Safety/judgement                   Current Attention Level: Selective   Following Commands: Follows one step commands with increased time Safety/Judgement: Decreased awareness of deficits;Decreased awareness of safety Awareness: Intellectual   General Comments: facial expressions with therapy, follows cues for head control and trunk until tired      Exercises      General Comments General comments (skin integrity, edema, etc.): Pt is motivated to try to move and balance, but weak and struggling with midline  Pertinent Vitals/Pain Pain Assessment: Faces Faces Pain Scale: No hurt    Home Living                      Prior Function            PT Goals (current goals can now be found in the care plan section) Acute Rehab PT Goals Patient Stated Goal: not able to state Progress towards PT goals: Progressing toward goals    Frequency    Min 3X/week      PT Plan Current plan remains appropriate    Co-evaluation              AM-PAC PT "6 Clicks" Mobility   Outcome Measure  Help needed  turning from your back to your side while in a flat bed without using bedrails?: A Lot Help needed moving from lying on your back to sitting on the side of a flat bed without using bedrails?: Total Help needed moving to and from a bed to a chair (including a wheelchair)?: Total Help needed standing up from a chair using your arms (e.g., wheelchair or bedside chair)?: Total Help needed to walk in hospital room?: Total Help needed climbing 3-5 steps with a railing? : Total 6 Click Score: 7    End of Session   Activity Tolerance: Patient tolerated treatment well Patient left: in bed;with call bell/phone within reach;with nursing/sitter in room;with family/visitor present;with restraints reapplied Nurse Communication: Mobility status PT Visit Diagnosis: Other abnormalities of gait and mobility (R26.89);Muscle weakness (generalized) (M62.81)     Time: 7116-5790 PT Time Calculation (min) (ACUTE ONLY): 24 min  Charges:  $Therapeutic Activity: 8-22 mins $Neuromuscular Re-education: 8-22 mins                   Ramond Dial 01/26/2021, 3:41 PM  Mee Hives, PT PhD Acute Rehab Dept. Number: [place] [facility] [place]

## 2021-01-26 NOTE — Consult Note (Signed)
Consultation Note Date: 01/26/2021   Patient Name: Alexis Ball  DOB: June 23, 1955  MRN: [id number]  Age / Sex: 65 y.o., female  PCP: Arthur Holms, NP Referring Physician: Darliss Cheney, MD  Reason for Consultation: Establishing goals of care "[place], ready for hospice, family interested"  HPI/Patient Profile: 65 y.o. female  with past medical history CVA without residual deficits, DVT of lower extremity, osteomyelitis (10/2019), vascular dementia, HTN, anemia, and polysubstance abuse was brought into the emergency department on 01/15/2021 after she was found pulseless by her neighbor. EMS started CPR, ROSC was achieved. In the ED, patient underwent PEA arrest again, ROSC was achieved after 3 minutes CPR. Vasopressors were needed to maintain adequate blood pressure. She was admitted to [facility].  Patient was intubated on admission and extubated 10/11.   10/17 - SLP recommend patient remains NPO  Clinical Assessment and Goals of Care: I have reviewed medical records including EPIC notes, labs and imaging, and examined the patient. She is non-verbal. On my assessment, it appears she is attempting to follow commands but it is difficult to tell due to her weakness.    I spoke with daughter/Natisha by phone to discuss diagnosis, prognosis, GOC, EOL wishes, disposition, and options. I introduced Palliative Medicine as specialized medical care for people living with serious illness. It focuses on providing relief from the symptoms and stress of a serious illness.   We discussed a brief life review of the patient. She was born in [place], and moved to [name] with her family when she was a child. 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 They separated after 11 years; he is deceased now. They had 3 children. Daughter/Natisha is the oldest, daughter Caryl Ada (who is wheelchair bound due to cerebellar ataxia), and son Harrell Gave.  [name] tells me that she is the point of contact for the family; her brother is involved but defers to [place] for major decisions.   Prior to admissions, patient lived alone in her home. As far as functional status, she was ambulatory and completely independent. The only help she received from family is organizing her medications into a weekly pill box.   We discussed her current illness and what it means in the larger context of her ongoing co-morbidities. Discussed that patient had suffered cardiac arrest (died) twice. I provided education that although she had survived the initial event, she had suffered significant (anoxic) brain injury due to lack of oxygen during the arrest.  Discussed that she was now is a debilitated state - unable to walk, unable to talk/communicate, and unable to swallow/eat by mouth. Discussed   I attempted to elicit values and goals of care important to the patient. [name] describes her mother as a "busy body - always doing something". She also describes her as happy and independent.   The difference between full scope medical intervention and comfort care was considered. I provided education that through this hospitalization, patient has been receiving full scope treatment. If this continues, it would mean patient would need placement in a long-term care facility.  She would also need a permanent feeding tube (PEG).      I introduced the concept of a comfort path, emphasizing that this path involves de-escalating and stopping full scope medical interventions, allowing a natural course to occur. Discussed that the goal is comfort and dignity rather than cure/prolonging life. Encouraged Natisha to consider at what point they would want to stop full scope medical interventions, keeping in mind the concept of quality of life.   We did discuss code status. Encouraged Natisha to consider DNR/DNI status understanding evidenced based poor outcomes in similar hospitalized patients,  as the cause of the arrest is likely associated with chronic/terminal disease rather than a reversible acute cardio-pulmonary event. I explained that it is a protective measure to keep [place] from harming the patient in their last moments of life. She seems to indicate that she would agree that DNR is appropriate however she is not yet ready to make that decision.  [name] is receptive, but also very overwhelmed by this information. She states she needs time to process, and is not able or willing to make any major decisions today. [name] agrees to a follow-up [place] discussion with PMT and her brother Harrell Gave if possible.   I emailed [name] an electronic copy of "hard choices book".   Primary decision maker: Legally, the majority of her adult children. There is no documented HCPOA.  [name] states she is the main Media planner for the family, but we need to include Harrell Gave in any major decisions.     SUMMARY OF RECOMMENDATIONS   Full code full scope with ongoing discussions Discussed with daughter full scope versus comfort care Daughter is overwhelmed, needs times to process Plan for follow-up [place] discussion with daughter and son tomorrow   Code Status/Advance Care Planning: DNR  Palliative Prophylaxis:  Oral Care and Turn Reposition  Additional Recommendations (Limitations, Scope, Preferences): Full Scope Treatment  Prognosis:  Overall poor  Discharge Planning: To Be Determined      Primary Diagnoses: Present on Admission:  Cardiac arrest [facility])   I have reviewed the medical record, interviewed the patient and family, and examined the patient. The following aspects are pertinent.  Past Medical History:  Diagnosis Date   Allergic rhinitis    Ambulates with cane    Full dentures    GERD (gastroesophageal reflux disease)    occasional , takes tums   Glaucoma, both eyes    Heart murmur    evalulated by cardiologist--- dr h. Johney Frame 01-07-2020 note in epic, echo done  01-31-2020 normal w/ no valve abnormalities   History of cellulitis    2006  LLE   History of chest pain    had cardiac cath 10-16-2004 (in epic)  showed normal coronaries that are tortous, patent renal arteries, normal LVF with ef 50-55%, noncardiac chest pain;  pt had ED visit 12-07-2019 negative work-up referred to cardiology, was seen by dr Johney Frame 01-07-2020 atypical cp / non-cardiac echo ordered / no further work-up   History of CVA (cerebrovascular accident) without residual deficits    11-29-2019  per pt happened in 2007 and has not had cva/ tia sympomts since   History of DVT of lower extremity    06-26-2020  per pt many years ago had RLE DVT completed blood thinner treatement stated not other blood clot until 07/ 2021 RUE with superficial venous thrombosis assosiated with PICC line which was removed   History of osteomyelitis 10/2019   left foot  post op bunionectomy 06/ 2021  Hypertension    followed by pcp   IDA (iron deficiency anemia)    Migraines    11-29-2019 per pt previously had botox injection for prevention, last had 3 yrs ago,  now uses breathing , meditation, and minimizes triggers (bright light/ loud noise)   Neuropathy    feet   OA (osteoarthritis)    Prediabetes    Vascular dementia (HCC)    per pcp h&p 06-18-2020 with mild cognitive impairment take, namenda with improvement   Vitamin D deficiency    Wears glasses      Family History  Problem Relation Age of Onset   Hypertension Mother    Scheduled Meds:  amLODipine  10 mg Per Tube QHS   brimonidine  1 drop Both Eyes BID   chlorhexidine  15 mL Mouth Rinse BID   Chlorhexidine Gluconate Cloth  6 each Topical Daily   dorzolamide-timolol  1 drop Both Eyes BID   feeding supplement (PROSource TF)  45 mL Per Tube Daily   [START ON 01/27/2021] gabapentin  300 mg Per Tube QHS   heparin injection (subcutaneous)  5,000 Units Subcutaneous Q8H   hydrALAZINE  50 mg Per Tube Q8H   insulin aspart  2-6 Units  Subcutaneous Q4H   insulin glargine-yfgn  10 Units Subcutaneous Daily   latanoprost  1 drop Both Eyes QHS   mouth rinse  15 mL Mouth Rinse q12n4p   memantine  10 mg Per Tube Daily   QUEtiapine  150 mg Per Tube QHS   sodium zirconium cyclosilicate  10 g Per Tube BID   Continuous Infusions:  sodium chloride 250 mL (01/23/21 1819)   sodium chloride 125 mL/hr at 01/26/21 1249   feeding supplement (OSMOLITE 1.2 CAL) 1,000 mL (01/26/21 0914)   PRN Meds:.acetaminophen (TYLENOL) oral liquid 160 mg/5 mL, cyclobenzaprine, docusate, Gerhardt's butt cream, haloperidol **OR** haloperidol lactate, hydrALAZINE, labetalol, lip balm, LORazepam, LORazepam, oxyCODONE-acetaminophen, polyethylene glycol, polyvinyl alcohol, QUEtiapine   Allergies  Allergen Reactions   Iodine Itching    Per pt when put on skin   Review of Systems  Unable to perform ROS: Patient nonverbal   Physical Exam Vitals reviewed.  Constitutional:      General: She is not in acute distress.    Appearance: She is ill-appearing.  Pulmonary:     Effort: Pulmonary effort is normal.  Abdominal:     Comments: cortrak  Neurological:     Mental Status: She is alert.     Motor: Weakness present.  Psychiatric:        Speech: She is noncommunicative.    Vital Signs: BP (!) 122/92 (BP Location: Right Arm)   Pulse 88   Temp 98 F (36.7 C) (Oral)   Resp 18   Ht 5\' 2"  (1.575 m)   Wt 85.9 kg   SpO2 94%   BMI 34.64 kg/m  Pain Scale: FLACC   Pain Score: Asleep   SpO2: SpO2: 94 % O2 Device:SpO2: 94 %   LBM: Last BM Date: 01/24/21 Baseline Weight: Weight: 93.1 kg Most recent weight: Weight: 85.9 kg      Palliative Assessment/Data: PPS 30%     Time In: 1600 Time Out: 1710 Time Total: 70 minutes Greater than 50%  of this time was spent counseling and coordinating care related to the above assessment and plan.  Signed by: Lavena Bullion, NP   Please contact Palliative Medicine Team phone at [phone] for  questions and concerns.  For individual provider: See Shea Evans

## 2021-01-27 ENCOUNTER — Inpatient Hospital Stay (HOSPITAL_COMMUNITY): Payer: Medicare Other

## 2021-01-27 DIAGNOSIS — G8250 Quadriplegia, unspecified: Secondary | ICD-10-CM | POA: Diagnosis not present

## 2021-01-27 DIAGNOSIS — Z711 Person with feared health complaint in whom no diagnosis is made: Secondary | ICD-10-CM

## 2021-01-27 DIAGNOSIS — Z66 Do not resuscitate: Secondary | ICD-10-CM | POA: Diagnosis not present

## 2021-01-27 DIAGNOSIS — T50901A Poisoning by unspecified drugs, medicaments and biological substances, accidental (unintentional), initial encounter: Secondary | ICD-10-CM

## 2021-01-27 DIAGNOSIS — N179 Acute kidney failure, unspecified: Secondary | ICD-10-CM | POA: Diagnosis not present

## 2021-01-27 DIAGNOSIS — I469 Cardiac arrest, cause unspecified: Secondary | ICD-10-CM | POA: Diagnosis not present

## 2021-01-27 DIAGNOSIS — T405X1A Poisoning by cocaine, accidental (unintentional), initial encounter: Secondary | ICD-10-CM | POA: Diagnosis not present

## 2021-01-27 DIAGNOSIS — Z789 Other specified health status: Secondary | ICD-10-CM

## 2021-01-27 DIAGNOSIS — G9340 Encephalopathy, unspecified: Secondary | ICD-10-CM | POA: Diagnosis not present

## 2021-01-27 DIAGNOSIS — Z515 Encounter for palliative care: Secondary | ICD-10-CM | POA: Diagnosis not present

## 2021-01-27 DIAGNOSIS — R638 Other symptoms and signs concerning food and fluid intake: Secondary | ICD-10-CM

## 2021-01-27 LAB — BASIC METABOLIC PANEL
Anion gap: 19 — ABNORMAL HIGH (ref 5–15)
Anion gap: 13 (ref 5–15)
BUN: 127 mg/dL — ABNORMAL HIGH (ref 8–23)
BUN: 110 mg/dL — ABNORMAL HIGH (ref 8–23)
CO2: 15 mmol/L — ABNORMAL LOW (ref 22–32)
CO2: 19 mmol/L — ABNORMAL LOW (ref 22–32)
Calcium: 8.6 mg/dL — ABNORMAL LOW (ref 8.9–10.3)
Calcium: 9.6 mg/dL (ref 8.9–10.3)
Chloride: 97 mmol/L — ABNORMAL LOW (ref 98–111)
Chloride: 101 mmol/L (ref 98–111)
Creatinine, Ser: 6.62 mg/dL — ABNORMAL HIGH (ref 0.44–1.00)
Creatinine, Ser: 5.03 mg/dL — ABNORMAL HIGH (ref 0.44–1.00)
GFR, Estimated: 7 mL/min — ABNORMAL LOW (ref >60)
GFR, Estimated: 9 mL/min — ABNORMAL LOW (ref >60)
Glucose, Bld: 84 mg/dL (ref 70–99)
Glucose, Bld: 92 mg/dL (ref 70–99)
Potassium: 6.9 mmol/L (ref 3.5–5.1)
Potassium: 6.2 mmol/L — ABNORMAL HIGH (ref 3.5–5.1)
Sodium: 131 mmol/L — ABNORMAL LOW (ref 135–145)
Sodium: 133 mmol/L — ABNORMAL LOW (ref 135–145)

## 2021-01-27 LAB — GLUCOSE, CAPILLARY
Glucose-Capillary: 111 mg/dL — ABNORMAL HIGH (ref 70–99)
Glucose-Capillary: 111 mg/dL — ABNORMAL HIGH (ref 70–99)
Glucose-Capillary: 122 mg/dL — ABNORMAL HIGH (ref 70–99)
Glucose-Capillary: 128 mg/dL — ABNORMAL HIGH (ref 70–99)
Glucose-Capillary: 130 mg/dL — ABNORMAL HIGH (ref 70–99)
Glucose-Capillary: 78 mg/dL (ref 70–99)
Glucose-Capillary: 85 mg/dL (ref 70–99)

## 2021-01-27 LAB — CBC
HCT: 33 % — ABNORMAL LOW (ref 36.0–46.0)
Hemoglobin: 10.4 g/dL — ABNORMAL LOW (ref 12.0–15.0)
MCH: 29.5 pg (ref 26.0–34.0)
MCHC: 31.5 g/dL (ref 30.0–36.0)
MCV: 93.8 fL (ref 80.0–100.0)
Platelets: 288 10*3/uL (ref 150–400)
RBC: 3.52 MIL/uL — ABNORMAL LOW (ref 3.87–5.11)
RDW: 13 % (ref 11.5–15.5)
WBC: 17.7 10*3/uL — ABNORMAL HIGH (ref 4.0–10.5)
nRBC: 0 % (ref 0.0–0.2)

## 2021-01-27 LAB — MAGNESIUM: Magnesium: 2.6 mg/dL — ABNORMAL HIGH (ref 1.7–2.4)

## 2021-01-27 LAB — CK: Total CK: 68 U/L (ref 38–234)

## 2021-01-27 MED ORDER — INSULIN ASPART 100 UNIT/ML IV SOLN
10.0000 [IU] | Freq: Once | INTRAVENOUS | Status: AC
  Administered 2021-01-27: 10 [IU] via INTRAVENOUS

## 2021-01-27 MED ORDER — NEPRO/CARBSTEADY PO LIQD: 1000.0000 mL | Freq: Two times a day (BID) | ORAL | Status: DC

## 2021-01-27 MED ORDER — NEPRO/CARBSTEADY PO LIQD
1000.0000 mL | ORAL | Status: DC
  Administered 2021-01-27 – 2021-01-28 (×2): 1000 mL via ORAL
  Filled 2021-01-27 (×3): qty 1000

## 2021-01-27 MED ORDER — SODIUM ZIRCONIUM CYCLOSILICATE 10 G PO PACK
10.0000 g | PACK | Freq: Two times a day (BID) | ORAL | Status: AC
  Administered 2021-01-27 (×2): 10 g
  Filled 2021-01-27 (×2): qty 1

## 2021-01-27 MED ORDER — DEXTROSE 50 % IV SOLN
1.0000 | Freq: Once | INTRAVENOUS | Status: AC
  Administered 2021-01-27: 50 mL via INTRAVENOUS
  Filled 2021-01-27: qty 50

## 2021-01-27 NOTE — Progress Notes (Signed)
   01/27/21 7681  Provider Notification  Provider Name/Title V. Rathore  Date Provider Notified 01/27/21  Time Provider Notified [phone]  Notification Type Page  Notification Reason Critical result (Potassium 6.9)  Date Critical Result Received 01/27/21  Time Critical Result Received 6203  Provider response Other (Comment) (awaiting call back)

## 2021-01-27 NOTE — Progress Notes (Addendum)
Daily Progress Note   Patient Name: Alexis Ball       Date: 01/27/2021 DOB: Apr 01, 1956  Age: 65 y.o. MRN#: [id number] Attending Physician: Darliss Cheney, MD Primary Care Physician: Arthur Holms, NP Admit Date: 01/15/2021  Reason for Consultation/Follow-up: Establishing goals of care "[place], ready for hospice, family interested"  Subjective: Chart review performed. Noted tube feeds had been stopped due to suspicion of aspiration. Received report from primary RN - no acute concerns. Reports patient's output since foley insertion has been large; remains minimally responsive and only mumbles to communicate.  Went to visit patient at bedside - no family/visitors present. Patient was lying in bed - she does not wake to voice/gentle touch. Mitts in use. Coretrak in place but not in use. No signs or non-verbal gestures of pain or discomfort noted. No respiratory distress, increased work of breathing, or secretions noted.   1:45 PM Called daughter/Natisha - she was at work and requested return call at 2:30pm.  2:35 PM Called Natisha per her request. Spoke with [name], her brother/patient's son, and patient's DIL/son's wife/Brittany via speakerphone. Reviewed interval history since last PMT discussion. Provided space and opportunity for family to review information previously given to them. Family as my personal thoughts on what next steps should be. Recommendation given for full comfort measures with hospice - reviewed patient's continual decline despite interventions as well as new concern for aspiration, which has lead to tube feeds being stopped. Expectations at EOL were reviewed.   Family stated that they do not want patient to pass away in the hospital and ask about options to meet this goal.  Provided education and counseling at length on the philosophy and benefits of hospice care. Discussed that it offers a holistic approach to care in the setting of end-stage illness, and is about supporting the patient where they are allowing nature to take it's course. Discussed the hospice team includes RNs, physicians, social workers, and chaplains. They can provide personal care, support for the family, and help keep patient out of the hospital as well as assist with DME needs for home hospice. Education provided on the difference between home vs residential hospice. Provided recommendation to transfer patient sooner than later if goal is to ensure she is not in the hospital at passing, as she does seem stable for transfer at  this time, but stability could change any time and she may become high risk of passing during transfer.   Family ask about g-tube placement at EOL. We discussed that evidence has shown that PEG tubes at EOL do not increase survival, prevent aspiration, or improve wound healing - they are considered a life prolonging measure. Use of PEG tubes is not medically recommended at EOL; rather, careful hand feeding with aspiration precautions has been shown to provide the best quality of life.   Family are clear that returning home with family and hospice is not an option. They are considering residential hospice facility vs LTC with hospice. Family request time to consider options and will call PMT later this afternoon with a decision.   4:30 PM Received notification family had called PMT number requesting return call.  4:45 PM Returned call - spoke with [name]. She explains after family discussion, they were agreeable to hospice referral and DNR/DNI. Requested [facility]. Family seemed to defer decisions after this to [name].    We talked about transition to comfort measures in house and what that would entail inclusive of medications to control pain, dyspnea, agitation, nausea,  and itching. We discussed stopping all unnecessary measures such as blood draws, needle sticks, oxygen, antibiotics, CBGs/insulin, cardiac monitoring, IVF, and frequent vital signs.  [name] initially was agreeable for transition to full comfort care in house; however, Delon [place] stated she wanted to "leave things as is" and see how patient's kidney's were doing tomorrow. Reviewed that despite kidney labs, patient's overall prognosis would not change due to inadequate nutritional intake and risk for recurrent infections due to dysphagia - they expressed understanding. Family is agreeable not to restart tube feeds.     Length of Stay: 12  Current Medications: Scheduled Meds:   amLODipine  10 mg Per Tube QHS   brimonidine  1 drop Both Eyes BID   chlorhexidine  15 mL Mouth Rinse BID   Chlorhexidine Gluconate Cloth  6 each Topical Daily   dorzolamide-timolol  1 drop Both Eyes BID   feeding supplement (PROSource TF)  45 mL Per Tube Daily   gabapentin  300 mg Per Tube QHS   heparin injection (subcutaneous)  5,000 Units Subcutaneous Q8H   hydrALAZINE  50 mg Per Tube Q8H   insulin aspart  2-6 Units Subcutaneous Q4H   insulin glargine-yfgn  10 Units Subcutaneous Daily   latanoprost  1 drop Both Eyes QHS   mouth rinse  15 mL Mouth Rinse q12n4p   memantine  10 mg Per Tube Daily   QUEtiapine  150 mg Per Tube QHS   sodium zirconium cyclosilicate  10 g Per Tube BID    Continuous Infusions:  sodium chloride 250 mL (01/23/21 1819)   sodium chloride 125 mL/hr at 01/27/21 0433   feeding supplement (OSMOLITE 1.2 CAL) Stopped (01/26/21 1220)    PRN Meds: acetaminophen (TYLENOL) oral liquid 160 mg/5 mL, cyclobenzaprine, docusate, Gerhardt's butt cream, haloperidol **OR** haloperidol lactate, hydrALAZINE, labetalol, lip balm, LORazepam, LORazepam, oxyCODONE-acetaminophen, polyethylene glycol, polyvinyl alcohol, QUEtiapine  Physical Exam Vitals and nursing note reviewed.  Constitutional:       General: She is not in acute distress.    Appearance: She is ill-appearing.  Pulmonary:     Effort: No respiratory distress.  Abdominal:     Comments: Coretrak in place  Skin:    General: Skin is warm and dry.  Neurological:     Mental Status: She is unresponsive.     Motor: Weakness present.  Psychiatric:        Speech: She is noncommunicative.            Vital Signs: BP (!) 142/82 (BP Location: Left Arm)   Pulse 88   Temp 97.9 F (36.6 C) (Oral)   Resp 20   Ht 5\' 2"  (1.575 m)   Wt 97.5 kg   SpO2 100%   BMI 39.31 kg/m  SpO2: SpO2: 100 % O2 Device: O2 Device: Room Air O2 Flow Rate: O2 Flow Rate (L/min): 1 L/min  Intake/output summary:  Intake/Output Summary (Last 24 hours) at 01/27/2021 1343 Last data filed at 01/27/2021 1158 Gross per 24 hour  Intake 0 ml  Output 3850 ml  Net -3850 ml   LBM: Last BM Date: 01/24/21 Baseline Weight: Weight: 93.1 kg Most recent weight: Weight: 97.5 kg       Palliative Assessment/Data: PPS 10%      Patient Active Problem List   Diagnosis Date Noted   History of ETT    Delirium    Respiratory failure (HCC)    Accidental drug overdose    Encephalopathy acute    Cardiac arrest ([place]) 01/15/2021   Osteomyelitis of toe of right foot (HCC)    Wound dehiscence    PICC (peripherally inserted central catheter) in place 11/23/2019   Medication monitoring encounter 11/23/2019   Osteomyelitis of ankle or foot, acute, right (HCC) 11/22/2019   Superficial venous thrombosis of arm, right 11/02/2019   Abscess of bursa of left foot 10/14/2019   Foot abscess, left 10/14/2019   Wound infection    Unilateral primary osteoarthritis, left knee 04/27/2019   Chondromalacia patellae, left knee 07/02/2016   Arthritis of left knee 02/25/2016   Vaginitis and vulvovaginitis, unspecified 08/08/2013   Leiomyoma of uterus, unspecified 08/08/2013   Atypical chest pain 03/13/2013   Abnormal ECG 03/13/2013   Obesity, unspecified 03/13/2013    Hypertension     Palliative Care Assessment & Plan   Patient Profile: 65 y.o. female  with past medical history CVA without residual deficits, DVT of lower extremity, osteomyelitis (10/2019), vascular dementia, HTN, anemia, and polysubstance abuse was brought into the emergency department on 01/15/2021 after she was found pulseless by her neighbor. EMS started CPR, ROSC was achieved. In the ED, patient underwent PEA arrest again, ROSC was achieved after 3 minutes CPR. Vasopressors were needed to maintain adequate blood pressure. She was admitted to [facility]  Patient was intubated on admission and extubated 10/11.    10/17 - SLP recommend patient remains NPO  Assessment: Acute encephalopathy PEA cardia arrests Acute hypoxic respiratory insufficiency Dysphagia Acute renal failure with hyperkalemia  Recommendations/Plan: Continue current medical treatment. Family want to see how patient's renal labs look tomorrow before making decision for transition to full comfort measures. Family understand that despite kidney function, patient's overall prognosis remains poor.  DNR/DNI initiated Family agreeable not to restart tube feeds Family are agreeable to residential hospice referral - requesting [facility]. TOC and [place] liaison notified. TOC consulted for: [facility] referral Family are clear they do not want patient to pass away in house PMT will follow up with family tomorrow 10/19 for decision on full comfort measures PMT will continue to follow and support holistically   Goals of Care and Additional Recommendations: Limitations on Scope of Treatment: No Artificial Feeding  Code Status:    Code Status Orders  (From admission, onward)           Start     Ordered   01/15/21  7262  Full code  Continuous        01/15/21 0739           Code Status History     Date Active Date Inactive Code Status Order ID Comments User Context   10/14/2019 1011 11/10/2019 0940 Full Code [id number]   Lequita Halt, MD Inpatient       Prognosis:  < 2 weeks  Discharge Planning: Hospice facility  Care plan was discussed with primary RN, hospice liaison, Dr. Doristine [place], [facility], patient's daughter, son, and DIL  Thank you for allowing the Palliative Medicine Team to assist in the care of this patient.   Total Time 95 minutes Prolonged Time Billed  yes       Greater than 50%  of this time was spent counseling and coordinating care related to the above assessment and plan.  Lin Landsman, NP  Please contact Palliative Medicine Team phone at [phone] for questions and concerns.

## 2021-01-27 NOTE — Progress Notes (Signed)
Dr. Marlowe Sax ordered Novolog 10units, EKG Stat and Dextrose 50% and Lokelma.

## 2021-01-27 NOTE — Progress Notes (Addendum)
Nutrition Follow-up  DOCUMENTATION CODES:   Not applicable  INTERVENTION:   -D/c Osmolite 1.2 Initiate Nepro @ 40 ml/hr via cortrak  Tube feeding regimen provides 1728 kcal (100% of needs), 78 grams of protein, and 698 ml of H2O.    NUTRITION DIAGNOSIS:   Inadequate oral intake related to acute illness as evidenced by NPO status.  Ongoing  GOAL:   Patient will meet greater than or equal to 90% of their needs  Progressing   MONITOR:   TF tolerance, Weight trends, Vent status  REASON FOR ASSESSMENT:   Ventilator Enteral/tube feeding initiation and management  ASSESSMENT:   .65 yo female admitted post cardiac arrest, respiratory failure requiring intubation. UDS cocaine +. PMH includes vascular dementia, Vit D and Iron def anemia, CVA, HTN, GERD  Reviewed I/O's: 0 ml x 24 hours and -2.7 L since admission  UOP: 1.3 L x 24 hours  Per SLP notes, plan to continue NPO status.   Pt unable to provide any meaningful history at time of visit. No family at bedside.   Palliative care following for goals of care discussions; plan for follow-up conversations today regarding goals of care.   Case discussed with pharmacy; recommending re-evaluating TF formula secondary to high K. K has been elevated for the past 48 hours.   Medications reviewed and include 0.9% sodium chloride infusion @ 125 ml/hr.   Labs reviewed: Na: 131, K: 6.9, CBGS: 111-122 (inpatient orders for glycemic control are 2-6 units insulin aspart every 4 hours and 10 units insulin glargine-yfgn daily).    Diet Order:   Diet Order             Diet NPO time specified  Diet effective now                   EDUCATION NEEDS:   No education needs have been identified at this time  Skin:  Skin Assessment: Skin Integrity Issues: Skin Integrity Issues:: Other (Comment) Other: skin tear medial perineum  Last BM:  01/24/21 (via rectal tube)  Height:   Ht Readings from Last 1 Encounters:  01/23/21 5'  2" (1.575 m)    Weight:   Wt Readings from Last 1 Encounters:  01/27/21 97.5 kg   BMI:  Body mass index is 39.31 kg/m.  Estimated Nutritional Needs:   Kcal:  1500-1700 kcals  Protein:  75-90 g  Fluid:  >/= 1.5 L    Loistine Chance, RD, LDN, [place] Registered Dietitian II Certified Diabetes Care and Education Specialist Please refer to [facility] for RD and/or RD on-call/weekend/after hours pager

## 2021-01-27 NOTE — Progress Notes (Signed)
PROGRESS NOTE    Alexis Ball  BOF:[id number] DOB: 03-17-1956 DOA: 01/15/2021 PCP: Arthur Holms, NP   Brief Narrative:  65 year old with extensive past medical history of GERD, CVA, DVT, hypertension, iron deficiency anemia, prediabetes, who was found on her porch by a neighbor early a.m. 01/15/2021 unresponsive EMS arrived to find PEA arrest she required intubation multiple rounds of epinephrine chest compressions and coded again in the emergency department x2.  Pulmonary critical care asked to admit, note she had drug paraphernalia around her, so this is presumed drug overdose.  She was admitted under ICU/PCCM and transferred to [facility] on 01/23/2021   Significant Hospital Events: Including procedures, antibiotic start and stop dates in addition to other pertinent events   01/15/2021 found down in PEA, on epi/ NE, re-arrest x 2 in ER, starting zosyn for aspiration pna, off pressors overnight 10/7 intermittently agitated, on precedex, prn versed, LTM d/c'd- neg seizure, MRI brain 10/12 Extubated yesterday, agitated requiring Precedex overnight    01/15/2021 CTH / cervical > no intracranial/ acute trauma findings to cervical, multilevel spinal degenerative changes greatest C5-6, dense consolidative changes at lung apices 01/15/2021 CTA chest/abd/ pelvis > neg dissection/ aneurysm, bilateral dependent lung consolidation, borderline to mild cardiomegaly, RCF venous catheter, minimally displaced anterior left 2nd rib fx 01/15/2021 lower extremity Doppler> negative 10/7 MRI brain >> non acute   10/6 SARS/ flu >neg 10/6 MRSA pcr > neg 10/6 Bcx2 >>neg 10/6 UC >>neg 10/7 trach asp >klebsiella pna   10/6 zosyn  10/7 unasyn >10/10  Assessment & Plan:   Active Problems:   Cardiac arrest ([place])   Respiratory failure (HCC)   Accidental drug overdose   Encephalopathy acute   History of ETT   Delirium  Acute encephalopathy, metabolic/ toxic and given multiple arrest, concern for anoxic injury.   Initial CT head and MRI brain neg.  EEG without seizues. Cocaine abuse: Precedex discontinued on 01/21/2021.  Patient with aphasia.  She is not following any commands today, appears to be densely encephalopathy, likely uremic.  PEA cardiac arrest- unclear etiology, possibly secondary to cocaine vs hypokalemia. TTE with EF 60-65%, normal RV, valves ok, no wall motion abnormalities. LE duplex neg and normal RV on TTE, very low risk of PE.  Continue supportive care.  Acute hypoxic respiratory insufficiency in the setting of cardiac arrest and possible aspiration pneumonia: Extubated on 01/20/2021.  Currently on room air.  Completed course of Unasyn.   Transaminitis: suspect from post arrest shock liver.  Improving.  Hyperglycemia: Controlled.  Continue Semglee 10 units and continue SSI.  Dysphagia/nutrition: Tube feedings are stopped due to suspicion of possible aspiration.  SLP following.  Remains NPO.  Failing swallow.  Acute renal failure with hyperkalemia: Creatinine jumped to 6.5 and potassium to 6.9 despite of treating hyperkalemia with hyperkalemia protocol yesterday.  Had almost no urine output.  Indwelling Foley catheter was ordered and reportedly, patient put out about 1250 cc once Foley catheter was inserted.  Patient was also given dextrose, regular insulin and Lokelma this morning by night hospitalist.  Patient has had another 1100 cc output with total of 2300 so far.  With significant urine output I expect that her kidney function and hyperkalemia will improve.  Rechecking BMP at noon.  I have consulted nephrology to stay on board in case she were to require urgent dialysis.  Ultrasound renal also pending.    Essential hypertension: Controlled, continue amlodipine 10 mg daily, hydralazine 50 mg every 8 hours, discontinued Coreg and hydrochlorothiazide on 01/26/2021.  Goal of care/significant deconditioning: Patient extremely dependent with quadriparesis.  Chances of meaningful recovery  very slim if there is any.  Patient's daughter-in-law is very receptive and reasonable of the fact that patient should be hospice but the decision has to come from patient's oldest sister.  According to daughter-in-law who I spoke to at the bedside once again today, she understands that patient will not have meaningful recovery and that she agrees patient should be hospice and DNR and that she is trying to convince the rest of the family however the middle daughter of the patient who herself is wheelchair-bound is resistant of the idea and at this point in time, they want aggressive care.  We discussed about DNR as well and they are thinking about that as well and has not come to a decision yet.  Palliative care was also able to speak to the family.  DVT prophylaxis: heparin injection 5,000 Units Start: 01/16/21 1400 SCDs Start: 01/15/21 0735   Code Status: Full Code  Family Communication: Daughter-in-law at bedside Status is: Inpatient  Remains inpatient appropriate because: Needs inpatient hospitalization.  Estimated body mass index is 39.31 kg/m as calculated from the following:   Height as of this encounter: 5\' 2"  (1.575 m).   Weight as of this encounter: 97.5 kg.  Nutritional Assessment: Body mass index is 39.31 kg/m.Marland Kitchen Seen by dietician.  I agree with the assessment and plan as outlined below: Nutrition Status: Nutrition Problem: Inadequate oral intake Etiology: acute illness Signs/Symptoms: NPO status Interventions: MVI, Tube feeding  .  Skin Assessment: I have examined the patient's skin and I agree with the wound assessment as performed by the wound care RN as outlined below:    Consultants:  None  Procedures:  None  Antimicrobials:  Anti-infectives (From admission, onward)    Start     Dose/Rate Route Frequency Ordered Stop   01/19/21 1145  cefTRIAXone (ROCEPHIN) 2 g in sodium chloride 0.9 % 100 mL IVPB  Status:  Discontinued        2 g 200 mL/hr over 30 Minutes  Intravenous Every 24 hours 01/19/21 1052 01/19/21 1052   01/16/21 1400  Ampicillin-Sulbactam (UNASYN) 3 g in sodium chloride 0.9 % 100 mL IVPB  Status:  Discontinued        3 g 200 mL/hr over 30 Minutes Intravenous Every 6 hours 01/16/21 0816 01/19/21 1052   01/15/21 1400  piperacillin-tazobactam (ZOSYN) IVPB 3.375 g  Status:  Discontinued        3.375 g 12.5 mL/hr over 240 Minutes Intravenous Every 8 hours 01/15/21 1104 01/16/21 0816   01/15/21 0800  Ampicillin-Sulbactam (UNASYN) 3 g in sodium chloride 0.9 % 100 mL IVPB  Status:  Discontinued        3 g 200 mL/hr over 30 Minutes Intravenous Every 6 hours 01/15/21 0652 01/15/21 1104          Subjective: Patient seen and examined.  Patient is densely encephalopathic, perhaps uremic encephalopathy today.  Not opening eyes, not following any commands.  Objective: Vitals:   01/26/21 0709 01/26/21 1137 01/26/21 2023 01/27/21 0342  BP: 130/75 (!) 122/92 (!) 155/80 131/72  Pulse: 87 88 88 84  Resp: 20 18 (!) 24 20  Temp: 98.3 F (36.8 C) 98 F (36.7 C) 97.7 F (36.5 C) 97.9 F (36.6 C)  TempSrc: Oral Oral Oral Oral  SpO2: 93% 94% 99% 91%  Weight:    97.5 kg  Height:        Intake/Output Summary (Last  24 hours) at 01/27/2021 1055 Last data filed at 01/27/2021 0830 Gross per 24 hour  Intake 0 ml  Output 1250 ml  Net -1250 ml    Filed Weights   01/24/21 0500 01/26/21 0432 01/27/21 0342  Weight: 85.4 kg 85.9 kg 97.5 kg    Examination:  General exam: Appears encephalopathic. Respiratory system: Clear to auscultation with upper airway sounds. Respiratory effort normal. Cardiovascular system: S1 & S2 heard, RRR. No JVD, murmurs, rubs, gallops or clicks. No pedal edema. Gastrointestinal system: Abdomen is nondistended, soft and nontender. No organomegaly or masses felt. Normal bowel sounds heard. Central nervous system: Lethargic, not following commands.   Data Reviewed: I have personally reviewed following labs and imaging  studies  CBC: Recent Labs  Lab 01/23/21 0330 01/24/21 0229 01/25/21 0313 01/26/21 0345 01/27/21 0327  WBC 13.7* 17.2* 23.1* 20.4* 17.7*  NEUTROABS  --  13.1*  --   --   --   HGB 12.8 13.1 12.7 11.6* 10.4*  HCT 39.3 39.2 38.4 34.8* 33.0*  MCV 91.4 89.5 89.7 90.6 93.8  PLT 336 380 392 342 194    Basic Metabolic Panel: Recent Labs  Lab 01/21/21 0250 01/22/21 0105 01/23/21 0330 01/25/21 0313 01/26/21 0345 01/27/21 0327  NA 139 137 132* 131* 130* 131*  K 4.1 3.9 3.8 5.1 6.2* 6.9*  CL 100 101 100 99 96* 97*  CO2 27 22 22  19* 20* 15*  GLUCOSE 123* 119* 172* 166* 156* 84  BUN 13 17 25* 66* 101* 127*  CREATININE 0.68 0.68 0.82 2.39* 4.67* 6.62*  CALCIUM 9.3 9.5 9.4 9.1 9.1 8.6*  MG 2.1 QUANTITY NOT SUFFICIENT, UNABLE TO PERFORM TEST 2.0  --   --  2.6*  PHOS 3.3 3.6 4.4  --   --   --     GFR: Estimated Creatinine Clearance: 9.5 mL/min (A) (by C-G formula based on SCr of 6.62 mg/dL (H)). Liver Function Tests: Recent Labs  Lab 01/21/21 0250 01/22/21 0105 01/23/21 0330  AST 54* 97* 49*  ALT 112* 146* 121*  ALKPHOS 60 65 69  BILITOT 0.9 QUANTITY NOT SUFFICIENT, UNABLE TO PERFORM TEST 0.5  PROT 6.9 7.6 7.0  ALBUMIN 2.8* 2.9* 2.9*    No results for input(s): LIPASE, AMYLASE in the last 168 hours. No results for input(s): AMMONIA in the last 168 hours.  Coagulation Profile: No results for input(s): INR, PROTIME in the last 168 hours. Cardiac Enzymes: Recent Labs  Lab 01/27/21 0327  CKTOTAL 68   BNP (last 3 results) No results for input(s): PROBNP in the last 8760 hours. HbA1C: No results for input(s): HGBA1C in the last 72 hours. CBG: Recent Labs  Lab 01/26/21 1546 01/26/21 2019 01/27/21 0007 01/27/21 0414 01/27/21 0715  GLUCAP 113* 80 128* 111* 122*    Lipid Profile: No results for input(s): CHOL, HDL, LDLCALC, TRIG, CHOLHDL, LDLDIRECT in the last 72 hours. Thyroid Function Tests: No results for input(s): TSH, T4TOTAL, FREET4, T3FREE, THYROIDAB in  the last 72 hours. Anemia Panel: No results for input(s): VITAMINB12, FOLATE, FERRITIN, TIBC, IRON, RETICCTPCT in the last 72 hours. Sepsis Labs: Recent Labs  Lab 01/23/21 0330  PROCALCITON <0.10     No results found for this or any previous visit (from the past 240 hour(s)).      Radiology Studies: No results found.  Scheduled Meds:  amLODipine  10 mg Per Tube QHS   brimonidine  1 drop Both Eyes BID   chlorhexidine  15 mL Mouth Rinse BID  Chlorhexidine Gluconate Cloth  6 each Topical Daily   dorzolamide-timolol  1 drop Both Eyes BID   feeding supplement (PROSource TF)  45 mL Per Tube Daily   gabapentin  300 mg Per Tube QHS   heparin injection (subcutaneous)  5,000 Units Subcutaneous Q8H   hydrALAZINE  50 mg Per Tube Q8H   insulin aspart  2-6 Units Subcutaneous Q4H   insulin glargine-yfgn  10 Units Subcutaneous Daily   latanoprost  1 drop Both Eyes QHS   mouth rinse  15 mL Mouth Rinse q12n4p   memantine  10 mg Per Tube Daily   QUEtiapine  150 mg Per Tube QHS   sodium zirconium cyclosilicate  10 g Per Tube BID   Continuous Infusions:  sodium chloride 250 mL (01/23/21 1819)   sodium chloride 125 mL/hr at 01/27/21 0433   feeding supplement (OSMOLITE 1.2 CAL) Stopped (01/26/21 1220)     LOS: 12 days   Time spent: 35 minutes   Darliss Cheney, MD [facility]  01/27/2021, 10:55 AM  Please page via [facility] and do not message via secure chat for anything urgent. Secure chat can be used for anything non urgent.  How to contact the [facility] Attending or Consulting provider [place] or covering provider during after hours [place], for this patient?  Check the care team in [facility] and look for a) attending/consulting [facility] provider listed and b) the [facility] team listed. Page or secure chat [phone]. Log into [email or web address] and use [place]'s universal password to access. If you do not have the password, please contact the hospital operator. Locate the [facility] provider you are looking for  under [facility] and page to a number that you can be directly reached. If you still have difficulty reaching the provider, please page the [facility] (Director on Call) for the Hospitalists listed on amion for assistance.

## 2021-01-27 NOTE — Consult Note (Signed)
Alexis Ball Admit Date: 01/15/2021 01/27/2021 Alexis Ball Requesting Physician:  Alexis Bosworth MD  Reason for Consult:  AKI, Hyperkalemia HPI:  26F history of [place], DVT, hypertension, substance use admitted on 10/6 after out of hospital cardiac arrest, PEA.  Prolonged time until return of spontaneous circulation and then in the ED patient had a second 3-minute arrest.  Required ICU stay from 10/6 through 10/14.  Course complicated by prolonged encephalopathy, likely anoxic injury.  MRI was negative.  She requires enteral nutrition and has very little purposeful movement up until now.  Palliative care is assisting with goals of care.  I was called for consult this morning as the patient has had rapid anuric kidney failure over the past 72 hours from a normal baseline.  Today her creatinine is 6.6, BUN 127, potassium 6.9, bicarbonate 15.  EKG without peaked T waves.  She has received insulin and dextrose, Lokelma.  Renal ultrasound is scheduled.  Just prior to my arrival nursing inserted a Foley catheter with immediate 1.3 L urine output.  Creatinine, Ser (mg/dL)  Date Value  01/27/2021 6.62 (H)  01/26/2021 4.67 (H)  01/25/2021 2.39 (H)  01/23/2021 0.82  01/22/2021 0.68  01/21/2021 0.68  01/20/2021 0.60  01/19/2021 0.67  01/18/2021 0.75  01/17/2021 0.75  ] I/Os: No intake/output data recorded.   ROS NSAIDS: No exposure IV Contrast no exposure TMP/SMX no exposure Hypotension not present Balance of 12 systems is negative w/ exceptions as above  PMH  Past Medical History:  Diagnosis Date   Allergic rhinitis    Ambulates with cane    Full dentures    GERD (gastroesophageal reflux disease)    occasional , takes tums   Glaucoma, both eyes    Heart murmur    evalulated by cardiologist--- dr Alexis Ball 01-07-2020 note in epic, echo done 01-31-2020 normal w/ no valve abnormalities   History of cellulitis    2006  LLE   History of chest pain    had cardiac cath  10-16-2004 (in epic)  showed normal coronaries that are tortous, patent renal arteries, normal LVF with ef 50-55%, noncardiac chest pain;  pt had ED visit 12-07-2019 negative work-up referred to cardiology, was seen by dr Johney Ball 01-07-2020 atypical cp / non-cardiac echo ordered / no further work-up   History of CVA (cerebrovascular accident) without residual deficits    11-29-2019  per pt happened in 2007 and has not had cva/ tia sympomts since   History of DVT of lower extremity    06-26-2020  per pt many years ago had RLE DVT completed blood thinner treatement stated not other blood clot until 07/ 2021 RUE with superficial venous thrombosis assosiated with PICC line which was removed   History of osteomyelitis 10/2019   left foot  post op bunionectomy 06/ 2021   Hypertension    followed by pcp   IDA (iron deficiency anemia)    Migraines    11-29-2019 per pt previously had botox injection for prevention, last had 3 yrs ago,  now uses breathing , meditation, and minimizes triggers (bright light/ loud noise)   Neuropathy    feet   OA (osteoarthritis)    Prediabetes    Vascular dementia ([place])    per pcp h&p 06-18-2020 with mild cognitive impairment take, namenda with improvement   Vitamin D deficiency    Wears glasses    PSH  Past Surgical History:  Procedure Laterality Date   BONE BIOPSY Left 10/14/2019   Procedure: BONE BIOPSY  PROXIMAL PHALANX;  Surgeon: Alexis Ball, DPM;  Location: [place];  Service: Podiatry;  Laterality: Left;   BONE BIOPSY Left 04-30-2020  _0    x2  on left foot   BONE BIOPSY Left 07/02/2020   Procedure: BONE BIOPSY - FROZEN SECTION;  Surgeon: Alexis Ball, DPM;  Location: [place];  Service: Podiatry;  Laterality: Left;   BUNIONECTOMY Left 10-05-2019  dr Alexis Ball _1    great toe   CARDIAC CATHETERIZATION  10-16-2004  dr Alexis Ball   normal coronaries (tortuous), normal LVF   CATARACT EXTRACTION W/ INTRAOCULAR LENS  IMPLANT, BILATERAL  2013    COLONOSCOPY  last one 04-23-2009  dr Alexis Ball   FOOT SURGERY Right unsure date   bunionectomy and hammertoe   HARDWARE REMOVAL Right 06/ 2015  _2    right foot s/p bunionectomy/ hammertoe conrrection   HARDWARE REMOVAL Left 12/05/2019   Procedure: HARDWARE REMOVAL ,WOUND CLOSURE OF FIRST TOE;  Surgeon: Alexis Ball, DPM;  Location: [place];  Service: Podiatry;  Laterality: Left;   IMPLANT OF A SILASTIC METATARSAL PHALANGE JOINT Left 07/02/2020   Procedure: IMPLANT OF A SILASTIC METATARSAL PHALANGE JOINT WITH REMOVAL OF ANTIBIOTIC SPACER;CAPSULAR REPAIR WITH TISSUE SUBSTITUTE;  Surgeon: Alexis Ball, DPM;  Location: [place];  Service: Podiatry;  Laterality: Left;   IRRIGATION AND DEBRIDEMENT FOOT Left 10/14/2019   Procedure: INCISION AND DRAINAGE FOR LEFT FOOT INFECTION, COMPLEX;  Surgeon: Alexis Ball, DPM;  Location: [place];  Service: Podiatry;  Laterality: Left;   IRRIGATION AND DEBRIDEMENT FOOT Left 10/17/2019   Procedure: Debridement and Irrigation of Left Foot; Removal of Silicone Implant; Insertion of Antibiotic Spacer; Application of External Fixator; Possible Application of Wound VAC;  Surgeon: Alexis Ball, DPM;  Location: [place];  Service: Podiatry;  Laterality: Left;  Pre-op Block POP/SAPH; MAC   KNEE ARTHROSCOPY Left 2018   ORIF CALCANEOUS FRACTURE Left 10/17/2019   Procedure: Application External Fixation;  Surgeon: Alexis Ball, DPM;  Location: [place];  Service: Podiatry;  Laterality: Left;   FH  Family History  Problem Relation Age of Onset   Hypertension Mother    SH  reports that she has never smoked. She has never used smokeless tobacco. She reports that she does not drink alcohol and does not use drugs. Allergies  Allergies  Allergen Reactions   Iodine Itching    Per pt when put on skin   Home medications Prior to Admission medications   Medication Sig Start Date End Date Taking? Authorizing Provider  amLODipine  (NORVASC) 5 MG tablet Take 1 tablet (5 mg total) by mouth daily. Patient taking differently: Take 5 mg by mouth at bedtime. 01/07/20 03/21/21 Yes Alexis Ball, Alexis Ee, MD  azelastine (ASTELIN) 0.1 % nasal spray SPRAY 1 SPRAY IN EACH NOSTRIL BY INTRANASAL ROUTE 2 TIMES PER DAY FOR 30 DAYS 06/16/20  Yes [provider]  bimatoprost (LUMIGAN) 0.01 % SOLN Place 1 drop into both eyes at bedtime.    Yes [provider]  brimonidine (ALPHAGAN) 0.15 % ophthalmic solution Place 1 drop into both eyes 2 (two) times daily.    Yes [provider]  calcium carbonate (TUMS - DOSED IN MG ELEMENTAL CALCIUM) 500 MG chewable tablet Chew 1 tablet by mouth as needed for indigestion or heartburn.   Yes [provider]  carboxymethylcellulose (REFRESH PLUS) 0.5 % SOLN Place 1 drop into both eyes daily as needed (dry eyes).   Yes [provider]  cetirizine (ZYRTEC) 10 MG tablet Take 10 mg by mouth daily.   Yes [provider]  cyclobenzaprine (FLEXERIL) 10 MG tablet Take 1 tablet (10 mg total) by mouth 2 (two) times daily as needed for muscle spasms. 12/17/20  Yes Curatolo, [name], DO  dorzolamide-timolol (COSOPT) 22.3-6.8 MG/ML ophthalmic solution Place 1 drop into both eyes 2 (two) times daily.  05/14/13  Yes [provider]  ferrous sulfate 325 (65 FE) MG tablet Take 325 mg by mouth daily with breakfast.   Yes [provider]  gabapentin (NEURONTIN) 300 MG capsule Take 1 capsule (300 mg total) by mouth 3 (three) times daily. One in the morning and two at bedtime. Patient taking differently: Take 300-600 mg by mouth See admin instructions. One in the morning ( 313m )  and two ( 6066m at bedtime 01/22/20  Yes Price, MiChristian MateDPM  hydrochlorothiazide (HYDRODIURIL) 25 MG tablet Take 25 mg by mouth daily.   Yes [provider]  ibuprofen (ADVIL) 600 MG tablet Take 1 tablet (600 mg total) by mouth every 8 (eight) hours as needed. Patient taking  differently: Take 600 mg by mouth every 8 (eight) hours as needed for headache or mild pain. 11/18/20  Yes PrEvelina BucyDPM  memantine (NAMENDA) 10 MG tablet Take 10 mg by mouth 2 (two) times daily. 03/17/20  Yes [provider]  Multiple Vitamin (MULTIVITAMIN) capsule Take 1 capsule by mouth daily.   Yes [provider]  NARCAN 4 MG/0.1ML LIQD nasal spray kit Place 0.4 mg into the nose as needed (opioid reversal). 12/28/19  Yes [provider]  Omega-3 Fatty Acids (FISH OIL) 1000 MG CAPS Take 1,000 mg by mouth daily.   Yes [provider]  ondansetron (ZOFRAN) 4 MG tablet Take 1 tablet (4 mg total) by mouth every 8 (eight) hours as needed for nausea or vomiting. 07/02/20  Yes PrEvelina BucyDPM  oxyCODONE-acetaminophen (PERCOCET) 5-325 MG tablet Take 1 tablet by mouth every 4 (four) hours as needed for severe pain. 12/30/20  Yes PrEvelina BucyDPM  Polyvinyl Alcohol-Povidone (REFRESH OP) Place 1 drop into both eyes 3 (three) times daily as needed (dry eyes).    Yes [provider]  potassium chloride SA (K-DUR,KLOR-CON) 20 MEQ tablet Take 20 mEq by mouth daily.   Yes [provider]  rosuvastatin (CRESTOR) 10 MG tablet Take 1 tablet (10 mg total) by mouth daily. Patient taking differently: Take 10 mg by mouth daily. 01/07/20 03/21/21 Yes PeFreada BergeronMD  silver sulfADIAZINE (SILVADENE) 1 % cream Apply pea-sized amount to wound daily. 12/25/19  Yes PrEvelina BucyDPM    Current Medications Scheduled Meds:  amLODipine  10 mg Per Tube QHS   brimonidine  1 drop Both Eyes BID   chlorhexidine  15 mL Mouth Rinse BID   Chlorhexidine Gluconate Cloth  6 each Topical Daily   dorzolamide-timolol  1 drop Both Eyes BID   feeding supplement (PROSource TF)  45 mL Per Tube Daily   gabapentin  300 mg Per Tube QHS   heparin injection (subcutaneous)  5,000 Units Subcutaneous Q8H   hydrALAZINE  50 mg Per Tube Q8H   insulin aspart  2-6 Units  Subcutaneous Q4H   insulin glargine-yfgn  10 Units Subcutaneous Daily   latanoprost  1 drop Both Eyes QHS   mouth rinse  15 mL Mouth Rinse q12n4p   memantine  10 mg Per Tube Daily   QUEtiapine  150 mg Per Tube  QHS   sodium zirconium cyclosilicate  10 g Per Tube BID   Continuous Infusions:  sodium chloride 250 mL (01/23/21 1819)   sodium chloride 125 mL/hr at 01/27/21 0433   feeding supplement (OSMOLITE 1.2 CAL) Stopped (01/26/21 1220)   PRN Meds:.acetaminophen (TYLENOL) oral liquid 160 mg/5 mL, cyclobenzaprine, docusate, Gerhardt's butt cream, haloperidol **OR** haloperidol lactate, hydrALAZINE, labetalol, lip balm, LORazepam, LORazepam, oxyCODONE-acetaminophen, polyethylene glycol, polyvinyl alcohol, QUEtiapine  CBC Recent Labs  Lab 01/24/21 0229 01/25/21 0313 01/26/21 0345 01/27/21 0327  WBC 17.2* 23.1* 20.4* 17.7*  NEUTROABS 13.1*  --   --   --   HGB 13.1 12.7 11.6* 10.4*  HCT 39.2 38.4 34.8* 33.0*  MCV 89.5 89.7 90.6 93.8  PLT 380 392 342 314   Basic Metabolic Panel Recent Labs  Lab 01/21/21 0250 01/22/21 0105 01/23/21 0330 01/25/21 0313 01/26/21 0345 01/27/21 0327  NA 139 137 132* 131* 130* 131*  K 4.1 3.9 3.8 5.1 6.2* 6.9*  CL 100 101 100 99 96* 97*  CO2 _0 19* 20* 15*  GLUCOSE 123* 119* 172* 166* 156* 84  BUN 13 17 25* 66* 101* 127*  CREATININE 0.68 0.68 0.82 2.39* 4.67* 6.62*  CALCIUM 9.3 9.5 9.4 9.1 9.1 8.6*  PHOS 3.3 3.6 4.4  --   --   --     Physical Exam   Blood pressure 131/72, pulse 84, temperature 97.9 F (36.6 C), temperature source Oral, resp. rate 20, height _1  (1.575 m), weight 97.5 kg, SpO2 91 %. GEN: Not interactive, lying flat in the bed ENT: NGT in place EYES: eyes closed CV: RRR no rub PULM: courase bs b/l ABD: soft not tender, no SP fullness GU: Foley in place SKIN: No rashes/lesions. No petechiae/purpura EXT:No sig LEE  Assessment 44F Anuric AKI with hyperkalemia, suspect obstructive.    Anuric AKI, normal baseline  SCR; likely 2/2 acute obstruction S/p cardiac arrest x2 Persistent encepahlopathy Hyeprkalemia, moderate, related to #1; EKG w/o peaked T waves Metabolic acidosis Hyponatremia, mild Hx/o HTN: off BP meds  Plan Cont foley Renal US pending Check CK, rhabdo could have similar presentation K should iimproved with relief of obstruction No HD needs currently, expect improvement. She is not a candidate for RRT given problem #3 Daily weights, Daily Renal Panel, Strict I/Os, Avoid nephrotoxins (NSAIDs, judicious IV Contrast)  Alexis Ball  01/27/2021, 9:42 AM

## 2021-01-28 DIAGNOSIS — R410 Disorientation, unspecified: Secondary | ICD-10-CM

## 2021-01-28 DIAGNOSIS — T50901A Poisoning by unspecified drugs, medicaments and biological substances, accidental (unintentional), initial encounter: Secondary | ICD-10-CM | POA: Diagnosis not present

## 2021-01-28 LAB — GLUCOSE, CAPILLARY
Glucose-Capillary: 107 mg/dL — ABNORMAL HIGH (ref 70–99)
Glucose-Capillary: 120 mg/dL — ABNORMAL HIGH (ref 70–99)
Glucose-Capillary: 123 mg/dL — ABNORMAL HIGH (ref 70–99)
Glucose-Capillary: 105 mg/dL — ABNORMAL HIGH (ref 70–99)

## 2021-01-28 LAB — CBC
HCT: 36.1 % (ref 36.0–46.0)
Hemoglobin: 11.9 g/dL — ABNORMAL LOW (ref 12.0–15.0)
MCH: 29.8 pg (ref 26.0–34.0)
MCHC: 33 g/dL (ref 30.0–36.0)
MCV: 90.5 fL (ref 80.0–100.0)
Platelets: 362 10*3/uL (ref 150–400)
RBC: 3.99 MIL/uL (ref 3.87–5.11)
RDW: 12.6 % (ref 11.5–15.5)
WBC: 14 10*3/uL — ABNORMAL HIGH (ref 4.0–10.5)
nRBC: 0 % (ref 0.0–0.2)

## 2021-01-28 LAB — MAGNESIUM: Magnesium: 1.8 mg/dL (ref 1.7–2.4)

## 2021-01-28 LAB — BASIC METABOLIC PANEL WITH GFR
Anion gap: 9 (ref 5–15)
BUN: 38 mg/dL — ABNORMAL HIGH (ref 8–23)
CO2: 21 mmol/L — ABNORMAL LOW (ref 22–32)
Calcium: 9.7 mg/dL (ref 8.9–10.3)
Chloride: 103 mmol/L (ref 98–111)
Creatinine, Ser: 1.26 mg/dL — ABNORMAL HIGH (ref 0.44–1.00)
GFR, Estimated: 48 mL/min — ABNORMAL LOW
Glucose, Bld: 124 mg/dL — ABNORMAL HIGH (ref 70–99)
Potassium: 3.8 mmol/L (ref 3.5–5.1)
Sodium: 133 mmol/L — ABNORMAL LOW (ref 135–145)

## 2021-01-28 MED ORDER — POLYVINYL ALCOHOL 1.4 % OP SOLN
1.0000 [drp] | Freq: Four times a day (QID) | OPHTHALMIC | Status: DC | PRN
  Filled 2021-01-28: qty 15

## 2021-01-28 MED ORDER — HYDROMORPHONE HCL 1 MG/ML IJ SOLN: 0.5000 mg | INTRAMUSCULAR | Status: DC | PRN

## 2021-01-28 MED ORDER — BIOTENE DRY MOUTH MT LIQD
15.0000 mL | Freq: Two times a day (BID) | OROMUCOSAL | Status: DC
  Administered 2021-01-28 – 2021-01-29 (×3): 15 mL via TOPICAL

## 2021-01-28 MED ORDER — GLYCOPYRROLATE 0.2 MG/ML IJ SOLN: 0.2000 mg | INTRAMUSCULAR | Status: DC | PRN

## 2021-01-28 MED ORDER — ONDANSETRON HCL 4 MG/2ML IJ SOLN: 4.0000 mg | Freq: Four times a day (QID) | INTRAMUSCULAR | Status: DC | PRN

## 2021-01-28 MED ORDER — ONDANSETRON 4 MG PO TBDP
4.0000 mg | ORAL_TABLET | Freq: Four times a day (QID) | ORAL | Status: DC | PRN
  Filled 2021-01-28: qty 1

## 2021-01-28 MED ORDER — ACETAMINOPHEN 650 MG RE SUPP: 650.0000 mg | Freq: Four times a day (QID) | RECTAL | Status: DC | PRN

## 2021-01-28 MED ORDER — HALOPERIDOL LACTATE 2 MG/ML PO CONC
2.0000 mg | Freq: Four times a day (QID) | ORAL | Status: DC | PRN
  Filled 2021-01-28: qty 1

## 2021-01-28 MED ORDER — HALOPERIDOL LACTATE 5 MG/ML IJ SOLN: 2.0000 mg | Freq: Four times a day (QID) | INTRAMUSCULAR | Status: DC | PRN

## 2021-01-28 MED ORDER — LORAZEPAM 2 MG/ML IJ SOLN: 1.0000 mg | INTRAMUSCULAR | Status: DC | PRN

## 2021-01-28 NOTE — Progress Notes (Signed)
Daily Progress Note   Patient Name: Alexis Ball       Date: 01/28/2021 DOB: 1955-07-25  Age: 65 y.o. MRN#: [id number] Attending Physician: Darliss Cheney, MD Primary Care Physician: Arthur Holms, NP Admit Date: 01/15/2021  Reason for Consultation/Follow-up: Establishing goals of care  Subjective: Chart review performed. Received report and updates from primary RN and Dr. Doristine Bosworth.  Received notification [name] called PMT number requesting I meet her at bedside.  10:00 AM Went to patient's bedside - [name] was present. Patient was lying in bed awake, alert, disoriented, and not able to participate in conversation or complex medical decision making. Patient seemed restless. No respiratory distress, increased work of breathing, or secretions noted. Coretrak is in place and in use.  [name] stated family reached a decision that they would like patient discharge to LTC. Asked [name] if [name] would be available by phone so I could educate on anticipated course if LTC is chosen. Natisha joined conversation via [employer].  Discussed if family would like to pursue PEG tube if goal is LTC. We discussed that evidence has shown that PEG tubes in patients with advanced dementia/EOL do not increase survival, prevent aspiration, or improve wound healing.  They can promote isolation and use of restraints leading to increased potential for pressure ulcers. Use of PEG tubes is not medically recommended in this population; rather, careful hand feeding with aspiration precautions has been shown to provide the best quality of life. Natural trajectory and expectations at EOL were reviewed in detail.  Reviewed that patient would be at high risk for rehospitalization if not discharged with hospice services.  Family are clear they would not want patient to die in the hospital - encouraged family to consider this when making decisions.   Reflected with family on patient's current holistic situation - reviewed current quality of life and if this is how she would want her life prolonged. Family is clear this is not how she would want to live. Reviewed that, unfortunately, despite her renal improvement, it does not change her overall situation (not able to communicate meaningfully, swallow/eat, walk). Family were understanding.   Daughter would like time to think about information discussed today - is agreeable for PMT to touch base this afternoon.   After Natisha left facetime, provided therapeutic listening and emotional support to [name] as  she expresses thoughts and feelings around patient's current medical situation. She feels patient would not want to prolong her life in this way and hospice would be the best option. She reflects on how close she and the patient have been over the last 5 years. She expresses that her husband/patient's son also agrees with hospice care. She is agreeable for PMT to reach out this afternoon.   3:50 PM Called family back to touch base on thoughts from this morning. Spoke with [place], and patient's son/Natisha's brother on speakerphone. They have decided to move forward with residential hospice referral, still ok with [facility]. They would like to continue tube feeds until patient is discharged - family understand that tube feeds will not be continued at [facility]. They ask if we "can stop poking her."  We talked again about transition to comfort measures in house and what that would entail inclusive of medications to control pain, dyspnea, agitation, nausea, and itching. We discussed stopping all unnecessary measures such as blood draws, needle sticks, oxygen, antibiotics, CBGs/insulin, cardiac monitoring, IVF, and frequent vital signs. Family are agreeable for  transition to full comfort measures in house today except stopping tube feeds.   Family do not want patient to pass away in the hospital and are hopeful for transfer to residential hospice facility soon.   All questions and concerns addressed. Encouraged to call with questions and/or concerns. PMT card previously provided.    Length of Stay: 13  Current Medications: Scheduled Meds:   amLODipine  10 mg Per Tube QHS   brimonidine  1 drop Both Eyes BID   chlorhexidine  15 mL Mouth Rinse BID   Chlorhexidine Gluconate Cloth  6 each Topical Daily   dorzolamide-timolol  1 drop Both Eyes BID   feeding supplement (NEPRO CARB STEADY)  1,000 mL Oral Q24H   gabapentin  300 mg Per Tube QHS   heparin injection (subcutaneous)  5,000 Units Subcutaneous Q8H   hydrALAZINE  50 mg Per Tube Q8H   insulin aspart  2-6 Units Subcutaneous Q4H   insulin glargine-yfgn  10 Units Subcutaneous Daily   latanoprost  1 drop Both Eyes QHS   mouth rinse  15 mL Mouth Rinse q12n4p   memantine  10 mg Per Tube Daily   QUEtiapine  150 mg Per Tube QHS    Continuous Infusions:  sodium chloride 250 mL (01/23/21 1819)   sodium chloride 125 mL/hr at 01/28/21 0938    PRN Meds: acetaminophen (TYLENOL) oral liquid 160 mg/5 mL, cyclobenzaprine, docusate, Gerhardt's butt cream, haloperidol **OR** haloperidol lactate, hydrALAZINE, labetalol, lip balm, LORazepam, LORazepam, oxyCODONE-acetaminophen, polyethylene glycol, polyvinyl alcohol, QUEtiapine  Physical Exam Vitals and nursing note reviewed.  Constitutional:      General: She is not in acute distress.    Appearance: She is ill-appearing.  Pulmonary:     Effort: No respiratory distress.  Abdominal:     Comments: Coretrak in place  Skin:    General: Skin is warm and dry.  Neurological:     Mental Status: She is alert and oriented to person, place, and time.     Motor: Weakness present.  Psychiatric:        Attention and Perception: Attention normal.         Speech: She is noncommunicative.        Behavior: Behavior is cooperative.        Cognition and Memory: Cognition and memory normal.            Vital  Signs: BP (!) 141/73 (BP Location: Right Arm)   Pulse 79   Temp 97.9 F (36.6 C) (Oral)   Resp 20   Ht 5\' 2"  (1.575 m)   Wt 86.7 kg   SpO2 96%   BMI 34.96 kg/m  SpO2: SpO2: 96 % O2 Device: O2 Device: Room Air O2 Flow Rate: O2 Flow Rate (L/min): 1 L/min  Intake/output summary:  Intake/Output Summary (Last 24 hours) at 01/28/2021 1233 Last data filed at 01/28/2021 1308 Gross per 24 hour  Intake 3083.78 ml  Output 6575 ml  Net -3491.22 ml   LBM: Last BM Date: 01/27/21 Baseline Weight: Weight: 93.1 kg Most recent weight: Weight: 86.7 kg       Palliative Assessment/Data: PPS 30% on tube feeds (10% once tube feeds stopped)      Patient Active Problem List   Diagnosis Date Noted   History of ETT    Delirium    Respiratory failure (HCC)    Accidental drug overdose    Encephalopathy acute    Cardiac arrest ([place]) 01/15/2021   Osteomyelitis of toe of right foot (HCC)    Wound dehiscence    PICC (peripherally inserted central catheter) in place 11/23/2019   Medication monitoring encounter 11/23/2019   Osteomyelitis of ankle or foot, acute, right (HCC) 11/22/2019   Superficial venous thrombosis of arm, right 11/02/2019   Abscess of bursa of left foot 10/14/2019   Foot abscess, left 10/14/2019   Wound infection    Unilateral primary osteoarthritis, left knee 04/27/2019   Chondromalacia patellae, left knee 07/02/2016   Arthritis of left knee 02/25/2016   Vaginitis and vulvovaginitis, unspecified 08/08/2013   Leiomyoma of uterus, unspecified 08/08/2013   Atypical chest pain 03/13/2013   Abnormal ECG 03/13/2013   Obesity, unspecified 03/13/2013   Hypertension     Palliative Care Assessment & Plan   Patient Profile: 65 y.o. female  with past medical history CVA without residual deficits, DVT of lower extremity,  osteomyelitis (10/2019), vascular dementia, HTN, anemia, and polysubstance abuse was brought into the emergency department on 01/15/2021 after she was found pulseless by her neighbor. EMS started CPR, ROSC was achieved. In the ED, patient underwent PEA arrest again, ROSC was achieved after 3 minutes CPR. Vasopressors were needed to maintain adequate blood pressure. She was admitted to [facility].  Patient was intubated on admission and extubated 10/11.    10/17 - SLP recommend patient remains NPO  Assessment: Acute encephalopathy PEA cardia arrests Acute hypoxic respiratory insufficiency Dysphagia Acute renal failure with hyperkalemia  Recommendations/Plan: Initiated full comfort measures Continue DNR/DNI as previously documented Family requesting patient transfer to [facility] - evaluation pending. TOC and [facility] liaison notified. Family do not want patient to pass away in the hospital and are hopeful for transfer to residential hospice facility soon Family would like to continue tube feeds until transfer - they understand this will be discontinued on transfer to [facility] Added orders for EOL symptom management and to reflect full comfort measures, as well as discontinued orders that were not focused on comfort Unrestricted visitation orders were placed per current [place] EOL visitation policy  Nursing to provide frequent assessments and administer PRN medications as clinically necessary to ensure EOL comfort PMT will continue to follow and support holistically   Goals of Care and Additional Recommendations: Limitations on Scope of Treatment: Full Comfort Care  Code Status:    Code Status Orders  (From admission, onward)  Start     Ordered   01/27/21 1711  Do not attempt resuscitation (DNR)  Continuous       Question Answer Comment  In the event of cardiac or respiratory ARREST Do not call a "code blue"   In the event of cardiac or respiratory ARREST Do not perform  Intubation, CPR, defibrillation or ACLS   In the event of cardiac or respiratory ARREST Use medication by any route, position, wound care, and other measures to relive pain and suffering. May use oxygen, suction and manual treatment of airway obstruction as needed for comfort.      01/27/21 1710           Code Status History     Date Active Date Inactive Code Status Order ID Comments User Context   01/15/2021 0739 01/27/2021 1710 Full Code [id number]  MinorGrace Bushy, NP ED   10/14/2019 1011 11/10/2019 0940 Full Code [id number]  Lequita Halt, MD Inpatient       Prognosis:  < 2 weeks once tube feeds stopped  Discharge Planning: Hospice facility  Care plan was discussed with primary RN, patient's family, TOC, [facility] liaison, Dr. Doristine Bosworth  Thank you for allowing the Palliative Medicine Team to assist in the care of this patient.   Total Time 100 minutes Prolonged Time Billed  yes       Greater than 50%  of this time was spent counseling and coordinating care related to the above assessment and plan.  Lin Landsman, NP  Please contact Palliative Medicine Team phone at [phone] for questions and concerns.

## 2021-01-28 NOTE — Progress Notes (Signed)
Occupational Therapy Treatment Patient Details Name: Alexis Ball MRN: [id number] DOB: 1955-05-28 Today's Date: 01/28/2021   History of present illness Pt is a 65 y.o.F admitted to ICU 01/15/2021 who was found unresponsive on her porch with PEA. She required intubation, multiple rounds of epinephrine, chest compressions and coded again in the ED x 2. Pt intubated 10/6-10/11. Pt also with acute encephalopathy, concern for anoxic injury; initial CT head and MRI brain neg. Significant PMH: drug use, CVA without residual deficits, vascular dementia.   OT comments  Pt alert and restless with arms randomly flailing about in bed. Pt returning greeting by saying, "Hi." Nodding head and stating, "No." Pt following commands of, "Show me your dimples," "Stick out your tongue," "Blink your eyes." Pt needing +2 total assist for bed level mobility and remains total assist for ADL. Attention remains poor.    Recommendations for follow up therapy are one component of a multi-disciplinary discharge planning process, led by the attending physician.  Recommendations may be updated based on patient status, additional functional criteria and insurance authorization.    Follow Up Recommendations  SNF    Equipment Recommendations  Hospital bed;Wheelchair (measurements OT);Wheelchair cushion (measurements OT);Other (comment) (lift equipment)    Recommendations for Other Services      Precautions / Restrictions Precautions Precautions: Fall Precaution Comments: cortrak, L mitt, foley       Mobility Bed Mobility Overal bed mobility: Needs Assistance             General bed mobility comments: Total assist to roll and to pull up in bed.    Transfers                      Balance                                           ADL either performed or assessed with clinical judgement   ADL                                         General ADL Comments:  dependent, brought L hand to face with washcloth with cue to "wipe your face," hand over hand assist to bring empty cup to mouth     Vision       Perception     Praxis      Cognition Arousal/Alertness: Awake/alert Behavior During Therapy: Restless Overall Cognitive Status: Impaired/Different from baseline Area of Impairment: Attention;Following commands;Awareness;Safety/judgement                   Current Attention Level: Focused   Following Commands: Follows one step commands inconsistently;Follows one step commands with increased time Safety/Judgement: Decreased awareness of safety Awareness: Intellectual   General Comments: pt saying, "Hi" upon OTs entry, nodding head and stating "No" when asked if she is cold, also saying "Jesus," other attempts to verbalize were unintelligible        Exercises     Shoulder Instructions       General Comments      Pertinent Vitals/ Pain       Pain Assessment: Faces Faces Pain Scale: No hurt  Home Living  Prior Functioning/Environment              Frequency  Min 2X/week        Progress Toward Goals  OT Goals(current goals can now be found in the care plan section)  Progress towards OT goals: Progressing toward goals  Acute Rehab OT Goals Patient Stated Goal: not able to state OT Goal Formulation: Patient unable to participate in goal setting Time For Goal Achievement: 02/05/21 Potential to Achieve Goals: [place] Discharge plan remains appropriate    Co-evaluation                 AM-PAC OT "6 Clicks" Daily Activity     Outcome Measure   Help from another person eating meals?: Total Help from another person taking care of personal grooming?: Total Help from another person toileting, which includes using toliet, bedpan, or urinal?: Total Help from another person bathing (including washing, rinsing, drying)?: Total Help from another  person to put on and taking off regular upper body clothing?: Total Help from another person to put on and taking off regular lower body clothing?: Total 6 Click Score: 6    End of Session    OT Visit Diagnosis: Muscle weakness (generalized) (M62.81)   Activity Tolerance Patient tolerated treatment well   Patient Left in bed;with call bell/phone within reach;with bed alarm set   Nurse Communication  (RN reports this is the most interactive he has seen pt today)        Time: 1449-1510 OT Time Calculation (min): 21 min  Charges: OT General Charges $OT Visit: 1 Visit OT Treatments $Therapeutic Activity: 8-22 mins  Nestor Lewandowsky, OTR/L Acute Rehabilitation Services Pager: [phone] Office: [phone]   Malka So 01/28/2021, 3:18 PM

## 2021-01-28 NOTE — Progress Notes (Signed)
Pt has had marked recovery/ normalization of her renal function after relief of obstruction and nearly 10L UOP in past 24h.  K is now normal SCR is 1.26.    Family working on [employer].  No further renal suggestions.  Will sign off at this time. Call back for any questions or concerns.

## 2021-01-28 NOTE — Progress Notes (Signed)
PROGRESS NOTE    Alexis Ball  XTK:[id number] DOB: 01/07/1956 DOA: 01/15/2021 PCP: Arthur Holms, NP   Brief Narrative:  65 year old with extensive past medical history of GERD, CVA, DVT, hypertension, iron deficiency anemia, prediabetes, who was found on her porch by a neighbor early a.m. 01/15/2021 unresponsive EMS arrived to find PEA arrest she required intubation multiple rounds of epinephrine chest compressions and coded again in the emergency department x2.  Pulmonary critical care asked to admit, note she had drug paraphernalia around her, so this is presumed drug overdose.  She was admitted under ICU/PCCM and transferred to [facility] on 01/23/2021   Significant Hospital Events: Including procedures, antibiotic start and stop dates in addition to other pertinent events   01/15/2021 found down in PEA, on epi/ NE, re-arrest x 2 in ER, starting zosyn for aspiration pna, off pressors overnight 10/7 intermittently agitated, on precedex, prn versed, LTM d/c'd- neg seizure, MRI brain 10/12 Extubated yesterday, agitated requiring Precedex overnight    01/15/2021 CTH / cervical > no intracranial/ acute trauma findings to cervical, multilevel spinal degenerative changes greatest C5-6, dense consolidative changes at lung apices 01/15/2021 CTA chest/abd/ pelvis > neg dissection/ aneurysm, bilateral dependent lung consolidation, borderline to mild cardiomegaly, RCF venous catheter, minimally displaced anterior left 2nd rib fx 01/15/2021 lower extremity Doppler> negative 10/7 MRI brain >> non acute   10/6 SARS/ flu >neg 10/6 MRSA pcr > neg 10/6 Bcx2 >>neg 10/6 UC >>neg 10/7 trach asp >klebsiella pna   10/6 zosyn  10/7 unasyn >10/10  Assessment & Plan:   Active Problems:   Cardiac arrest ([place])   Respiratory failure (HCC)   Accidental drug overdose   Encephalopathy acute   History of ETT   Delirium  Acute encephalopathy, metabolic/ toxic and given multiple arrest, concern for anoxic injury.   Initial CT head and MRI brain neg.  EEG without seizues. Cocaine abuse: Precedex discontinued on 01/21/2021.  Patient with aphasia.  She is much more alert compared to yesterday.  And it appears that she wants to follow the commands but she probably has dense quadriparesis and she is unable to move extremities except a little bit of right lower extremity.  PEA cardiac arrest- unclear etiology, possibly secondary to cocaine vs hypokalemia. TTE with EF 60-65%, normal RV, valves ok, no wall motion abnormalities. LE duplex neg and normal RV on TTE, very low risk of PE.  Continue supportive care.  Acute hypoxic respiratory insufficiency in the setting of cardiac arrest and possible aspiration pneumonia: Extubated on 01/20/2021.  Currently on room air.  Completed course of Unasyn.   Transaminitis: suspect from post arrest shock liver.  Improving.  Hyperglycemia: Controlled.  Continue Semglee 10 units and continue SSI.  Dysphagia/nutrition: Tube feedings are stopped due to suspicion of possible aspiration.  SLP following.  Remains NPO.  Failing swallow.  Acute renal failure with hyperkalemia: Creatinine jumped to 6.5 and potassium to 6.9 despite of treating hyperkalemia with hyperkalemia protocol, indwelling Foley catheter was inserted, patient put out 1250 cc right away.  So far patient has put out about 10 L of urine in last 24 hours.  Her creatinine has dramatically improved to 1.2 today and hyperkalemia resolved as well.  Essential hypertension: Controlled, continue amlodipine 10 mg daily, hydralazine 50 mg every 8 hours, discontinued Coreg and hydrochlorothiazide on 01/26/2021.  Goal of care/significant deconditioning: Patient extremely dependent with quadriparesis.  Chances of meaningful recovery very slim if there is any.  Patient's daughter-in-law very understanding of those facts.  Palliative  care on board.  Patient was made a DNR/DNI on 01/27/2021.  Family was considering [facility] but now  that her renal function has improved, family is now considering either long-term care or assisted living facility.  I have notified TOC and palliative care.  DVT prophylaxis: heparin injection 5,000 Units Start: 01/16/21 1400 SCDs Start: 01/15/21 0735   Code Status: DNR  Family Communication: Daughter-in-law at bedside Status is: Inpatient  Remains inpatient appropriate because: Needs inpatient hospitalization.  Estimated body mass index is 34.96 kg/m as calculated from the following:   Height as of this encounter: 5\' 2"  (1.575 m).   Weight as of this encounter: 86.7 kg.  Nutritional Assessment: Body mass index is 34.96 kg/m.Marland Kitchen Seen by dietician.  I agree with the assessment and plan as outlined below: Nutrition Status: Nutrition Problem: Inadequate oral intake Etiology: acute illness Signs/Symptoms: NPO status Interventions: MVI, Tube feeding  .  Skin Assessment: I have examined the patient's skin and I agree with the wound assessment as performed by the wound care RN as outlined below:    Consultants:  None  Procedures:  None  Antimicrobials:  Anti-infectives (From admission, onward)    Start     Dose/Rate Route Frequency Ordered Stop   01/19/21 1145  cefTRIAXone (ROCEPHIN) 2 g in sodium chloride 0.9 % 100 mL IVPB  Status:  Discontinued        2 g 200 mL/hr over 30 Minutes Intravenous Every 24 hours 01/19/21 1052 01/19/21 1052   01/16/21 1400  Ampicillin-Sulbactam (UNASYN) 3 g in sodium chloride 0.9 % 100 mL IVPB  Status:  Discontinued        3 g 200 mL/hr over 30 Minutes Intravenous Every 6 hours 01/16/21 0816 01/19/21 1052   01/15/21 1400  piperacillin-tazobactam (ZOSYN) IVPB 3.375 g  Status:  Discontinued        3.375 g 12.5 mL/hr over 240 Minutes Intravenous Every 8 hours 01/15/21 1104 01/16/21 0816   01/15/21 0800  Ampicillin-Sulbactam (UNASYN) 3 g in sodium chloride 0.9 % 100 mL IVPB  Status:  Discontinued        3 g 200 mL/hr over 30 Minutes Intravenous  Every 6 hours 01/15/21 0652 01/15/21 1104          Subjective: Patient seen and examined.  She is definitely alert compared to yesterday but still confused and aphasic.  Objective: Vitals:   01/27/21 2026 01/27/21 2100 01/27/21 2347 01/28/21 0515  BP: (!) 129/93  (!) 147/88 (!) 152/98  Pulse: 98  (!) 105 97  Resp: (!) 22  20 (!) 22  Temp: 98.2 F (36.8 C)  97.9 F (36.6 C) 97.6 F (36.4 C)  TempSrc: Oral   Oral  SpO2: (!) 89% 95% 100% 96%  Weight:    86.7 kg  Height:        Intake/Output Summary (Last 24 hours) at 01/28/2021 1004 Last data filed at 01/28/2021 0904 Gross per 24 hour  Intake 3083.78 ml  Output 9175 ml  Net -6091.22 ml    Filed Weights   01/26/21 0432 01/27/21 0342 01/28/21 0515  Weight: 85.9 kg 97.5 kg 86.7 kg    Examination:  General exam: Appears calm and comfortable  Respiratory system: Clear to auscultation. Respiratory effort normal. Cardiovascular system: S1 & S2 heard, RRR. No JVD, murmurs, rubs, gallops or clicks. No pedal edema. Gastrointestinal system: Abdomen is nondistended, soft and nontender. No organomegaly or masses felt. Normal bowel sounds heard. Central nervous system: Alert but not oriented.  Quadriparesis.  Data Reviewed: I have personally reviewed following labs and imaging studies  CBC: Recent Labs  Lab 01/24/21 0229 01/25/21 0313 01/26/21 0345 01/27/21 0327 01/28/21 0347  WBC 17.2* 23.1* 20.4* 17.7* 14.0*  NEUTROABS 13.1*  --   --   --   --   HGB 13.1 12.7 11.6* 10.4* 11.9*  HCT 39.2 38.4 34.8* 33.0* 36.1  MCV 89.5 89.7 90.6 93.8 90.5  PLT 380 392 342 288 423    Basic Metabolic Panel: Recent Labs  Lab 01/22/21 0105 01/23/21 0330 01/25/21 0313 01/26/21 0345 01/27/21 0327 01/27/21 1050 01/28/21 0555  NA 137 132* 131* 130* 131* 133* 133*  K 3.9 3.8 5.1 6.2* 6.9* 6.2* 3.8  CL 101 100 99 96* 97* 101 103  CO2 22 22 19* 20* 15* 19* 21*  GLUCOSE 119* 172* 166* 156* 84 92 124*  BUN 17 25* 66* 101* 127*  110* 38*  CREATININE 0.68 0.82 2.39* 4.67* 6.62* 5.03* 1.26*  CALCIUM 9.5 9.4 9.1 9.1 8.6* 9.6 9.7  MG QUANTITY NOT SUFFICIENT, UNABLE TO PERFORM TEST 2.0  --   --  2.6*  --  1.8  PHOS 3.6 4.4  --   --   --   --   --     GFR: Estimated Creatinine Clearance: 46.7 mL/min (A) (by C-G formula based on SCr of 1.26 mg/dL (H)). Liver Function Tests: Recent Labs  Lab 01/22/21 0105 01/23/21 0330  AST 97* 49*  ALT 146* 121*  ALKPHOS 65 69  BILITOT QUANTITY NOT SUFFICIENT, UNABLE TO PERFORM TEST 0.5  PROT 7.6 7.0  ALBUMIN 2.9* 2.9*    No results for input(s): LIPASE, AMYLASE in the last 168 hours. No results for input(s): AMMONIA in the last 168 hours.  Coagulation Profile: No results for input(s): INR, PROTIME in the last 168 hours. Cardiac Enzymes: Recent Labs  Lab 01/27/21 0327  CKTOTAL 68    BNP (last 3 results) No results for input(s): PROBNP in the last 8760 hours. HbA1C: No results for input(s): HGBA1C in the last 72 hours. CBG: Recent Labs  Lab 01/27/21 1617 01/27/21 2022 01/27/21 2342 01/28/21 0354 01/28/21 0743  GLUCAP 85 111* 130* 107* 120*    Lipid Profile: No results for input(s): CHOL, HDL, LDLCALC, TRIG, CHOLHDL, LDLDIRECT in the last 72 hours. Thyroid Function Tests: No results for input(s): TSH, T4TOTAL, FREET4, T3FREE, THYROIDAB in the last 72 hours. Anemia Panel: No results for input(s): VITAMINB12, FOLATE, FERRITIN, TIBC, IRON, RETICCTPCT in the last 72 hours. Sepsis Labs: Recent Labs  Lab 01/23/21 0330  PROCALCITON <0.10     No results found for this or any previous visit (from the past 240 hour(s)).      Radiology Studies: US RENAL  Result Date: 01/27/2021 CLINICAL DATA:  65 year old female with acute renal insufficiency. Status post cardiac arrest earlier this month. EXAM: RENAL / URINARY TRACT ULTRASOUND COMPLETE COMPARISON:  CTA chest, Abdomen, and Pelvis 01/15/2021. FINDINGS: Right Kidney: Renal measurements: 11.9 x 4.8 x 4.4 cm =  volume: 132 mL. Echogenicity at the upper limits of normal. Preserved corticomedullary differentiation. No solid mass or hydronephrosis visualized. Tiny 5-10 mm cortical cyst appears benign (image 16). Left Kidney: Renal measurements: 9.2 x 5.9 x 4.9 cm = volume: 138 mL. Echogenicity at the upper limits of normal similar to the right side. No mass or hydronephrosis visualized. Bladder: Partially decompressed with Foley catheter balloon visible. Otherwise negative. Other: None. IMPRESSION: 1. Negative ultrasound appearance of the kidneys. 2. Foley catheter within the bladder. Electronically  Signed   By: Genevie Ann M.D.   On: 01/27/2021 11:05    Scheduled Meds:  amLODipine  10 mg Per Tube QHS   brimonidine  1 drop Both Eyes BID   chlorhexidine  15 mL Mouth Rinse BID   Chlorhexidine Gluconate Cloth  6 each Topical Daily   dorzolamide-timolol  1 drop Both Eyes BID   feeding supplement (NEPRO CARB STEADY)  1,000 mL Oral Q24H   gabapentin  300 mg Per Tube QHS   heparin injection (subcutaneous)  5,000 Units Subcutaneous Q8H   hydrALAZINE  50 mg Per Tube Q8H   insulin aspart  2-6 Units Subcutaneous Q4H   insulin glargine-yfgn  10 Units Subcutaneous Daily   latanoprost  1 drop Both Eyes QHS   mouth rinse  15 mL Mouth Rinse q12n4p   memantine  10 mg Per Tube Daily   QUEtiapine  150 mg Per Tube QHS   Continuous Infusions:  sodium chloride 250 mL (01/23/21 1819)   sodium chloride 125 mL/hr at 01/28/21 0938     LOS: 13 days   Time spent: 30 minutes   Darliss Cheney, MD [facility]  01/28/2021, 10:04 AM  Please page via [facility] and do not message via secure chat for anything urgent. Secure chat can be used for anything non urgent.  How to contact the [facility] Attending or Consulting provider [place] or covering provider during after hours [place], for this patient?  Check the care team in [facility] and look for a) attending/consulting TRH provider listed and b) the [facility] team listed. Page or secure chat  7A-7P. Log into [email or web address] and use [facility] universal password to access. If you do not have the password, please contact the hospital operator. Locate the [facility] provider you are looking for under [facility] and page to a number that you can be directly reached. If you still have difficulty reaching the provider, please page the [facility] (Director on Call) for the Hospitalists listed on amion for assistance.

## 2021-01-28 NOTE — TOC Progression Note (Signed)
Transition of Care [facility]) - Progression Note    Patient Details  Name: Alexis Ball MRN: [id number] Date of Birth: Nov 23, 1955  [facility]) CM/SW Contact  Zenon Mayo, RN Phone Number: 01/28/2021, 12:46 PM  Clinical Narrative:    Palliative spoke with family again this am  , per palliative, family is still thinking about what is presented to them if they want to do a feeding tube  and long term care.  They are thinking it over now  and Palliative will touch basis with them again this afternoon per palliative.   Expected Discharge Plan: [place] Barriers to Discharge: Continued Medical Work up  Expected Discharge Plan and Services Expected Discharge Plan: [place] In-house Referral: Clinical Social Work     Living arrangements for the past 2 months: Single Family Home                                       Social Determinants of Health (SDOH) Interventions    Readmission Risk Interventions No flowsheet data found.

## 2021-01-29 NOTE — Progress Notes (Signed)
Daily Progress Note   Patient Name: Alexis Ball       Date: 01/29/2021 DOB: May 02, 1955  Age: 65 y.o. MRN#: [id number] Attending Physician: Darliss Cheney, MD Primary Care Physician: Arthur Holms, NP Admit Date: 01/15/2021  Reason for Consultation/Follow-up: end of life care, symptom management  Subjective: Patient appears comfortable. She is alert but non-verbal and does not follow commands. No non-verbal signs of pain or discomfort noted. Respirations are even and unlabored. No excessive respiratory secretions noted.   Family present at bedside. They are requesting that cortrak be removed - I let them know I agreed and would have the RN removed it as soon as possible. They are also requesting that the IV be removed due to concern for continued "sticks". I recommended that IV be left in for now as it is new and functional. Discussed that it would allow for option to use IV medications for pain, agitation, or dyspnea if needed. I provided reassurance that patient would not receive any additional sticks.  Education and counseling provided on expectations at EOL. Emotional support provided.      Length of Stay: 14   Physical Exam Vitals reviewed.  Constitutional:      General: She is not in acute distress.    Appearance: She is ill-appearing.  HENT:     Nose:     Comments: Cortrak in place left nare Pulmonary:     Effort: Pulmonary effort is normal.  Neurological:     Mental Status: She is alert.     Motor: Weakness present.  Psychiatric:        Speech: She is noncommunicative.            Vital Signs: BP 136/61 (BP Location: Right Arm)   Pulse 84   Temp 98 F (36.7 C) (Oral)   Resp 20   Ht 5\' 2"  (1.575 m)   Wt 86.7 kg   SpO2 99%   BMI 34.96 kg/m  SpO2: SpO2: 99 % O2  Device: O2 Device: Room Air   Intake/output summary:  Intake/Output Summary (Last 24 hours) at 01/29/2021 1314 Last data filed at 01/29/2021 0500 Gross per 24 hour  Intake --  Output 1350 ml  Net -1350 ml   LBM: Last BM Date: 01/27/21 Baseline Weight: Weight: 93.1 kg Most recent weight: Weight: 86.7 kg  Palliative Assessment/Data: PPS 20%     Palliative Care Assessment & Plan   Patient Profile: 65 y.o. female  with past medical history CVA without residual deficits, DVT of lower extremity, osteomyelitis (10/2019), vascular dementia, HTN, anemia, and polysubstance abuse was brought into the emergency department on 01/15/2021 after she was found pulseless by her neighbor. EMS started CPR, ROSC was achieved. In the ED, patient underwent PEA arrest again, ROSC was achieved after 3 minutes CPR. Vasopressors were needed to maintain adequate blood pressure. She was admitted to [facility].  Patient was intubated on admission and extubated 10/11.    10/17 - SLP recommend patient remains NPO  Assessment: Acute encephalopathy PEA cardia arrests Acute hypoxic respiratory insufficiency Dysphagia Acute renal failure with hyperkalemia  Recommendations/Plan: Continue comfort measures Stop tube feedings and remove cortrak PRN medications are available for symptom management at EOL Transfer to [facility] when bed is available PMT will continue to follow  Goals of Care and Additional Recommendations: Limitations on Scope of Treatment: Full Comfort Care  Code Status: DNR/DNI  Prognosis:  < 2 weeks  Discharge Planning: [place] was discussed with bedside RN  Thank you for allowing the Palliative Medicine Team to assist in the care of this patient.   Total Time 25 minutes Prolonged Time Billed  no       Greater than 50%  of this time was spent counseling and coordinating care related to the above assessment and plan.  Lavena Bullion, NP  Please contact  Palliative Medicine Team phone at [phone] for questions and concerns.

## 2021-01-29 NOTE — Progress Notes (Signed)
PROGRESS NOTE    Alexis Ball  TKW:[id number] DOB: 1955/10/01 DOA: 01/15/2021 PCP: Arthur Holms, NP   Brief Narrative:  65 year old with extensive past medical history of GERD, CVA, DVT, hypertension, iron deficiency anemia, prediabetes, who was found on her porch by a neighbor early a.m. 01/15/2021 unresponsive EMS arrived to find PEA arrest she required intubation multiple rounds of epinephrine chest compressions and coded again in the emergency department x2.  Pulmonary critical care asked to admit, note she had drug paraphernalia around her, so this is presumed drug overdose.  She was admitted under ICU/PCCM and transferred to [facility] on 01/23/2021   Significant Hospital Events: Including procedures, antibiotic start and stop dates in addition to other pertinent events   01/15/2021 found down in PEA, on epi/ NE, re-arrest x 2 in ER, starting zosyn for aspiration pna, off pressors overnight 10/7 intermittently agitated, on precedex, prn versed, LTM d/c'd- neg seizure, MRI brain 10/12 Extubated yesterday, agitated requiring Precedex overnight    01/15/2021 CTH / cervical > no intracranial/ acute trauma findings to cervical, multilevel spinal degenerative changes greatest C5-6, dense consolidative changes at lung apices 01/15/2021 CTA chest/abd/ pelvis > neg dissection/ aneurysm, bilateral dependent lung consolidation, borderline to mild cardiomegaly, RCF venous catheter, minimally displaced anterior left 2nd rib fx 01/15/2021 lower extremity Doppler> negative 10/7 MRI brain >> non acute   10/6 SARS/ flu >neg 10/6 MRSA pcr > neg 10/6 Bcx2 >>neg 10/6 UC >>neg 10/7 trach asp >klebsiella pna   10/6 zosyn  10/7 unasyn >10/10  Assessment & Plan:   Active Problems:   Cardiac arrest ([place])   Respiratory failure (HCC)   Accidental drug overdose   Encephalopathy acute   History of ETT   Delirium  Acute encephalopathy, metabolic/ toxic and given multiple arrest, concern for anoxic injury.   Initial CT head and MRI brain neg.  EEG without seizues. Cocaine abuse: Precedex discontinued on 01/21/2021.  Patient with aphasia.  Spontaneously moving some of the extremities but does not seem to have meaningful power in any of the extremities.  Patient was transitioned to comfort care on 01/28/2021.  Awaiting placement at [facility].  PEA cardiac arrest- unclear etiology, possibly secondary to cocaine vs hypokalemia. TTE with EF 60-65%, normal RV, valves ok, no wall motion abnormalities. LE duplex neg and normal RV on TTE, very low risk of PE.  Continue supportive care.  Acute hypoxic respiratory insufficiency in the setting of cardiac arrest and possible aspiration pneumonia: Extubated on 01/20/2021.  Currently on room air.  Completed course of Unasyn.   Transaminitis: suspect from post arrest shock liver.  Improving.  Hyperglycemia: Controlled.  Continue Semglee 10 units and continue SSI.  Dysphagia/nutrition:  SLP following.  Remains NPO.  Failing swallow.  Family wants to continue to feeding symptoms she is discharged to [facility].  Acute renal failure with hyperkalemia: Creatinine jumped to 6.5 and potassium to 6.9 despite of treating hyperkalemia with hyperkalemia protocol on 01/27/2021, indwelling Foley catheter was inserted, patient put out 1250 cc right away.  Patient put out about 10 L of urine in following 24 hours.  Her creatinine has dramatically improved to 1.2 today and hyperkalemia resolved as well.  Nephrology was consulted but then they signed off.  Essential hypertension: Controlled, continue amlodipine 10 mg daily, hydralazine 50 mg every 8 hours, discontinued Coreg and hydrochlorothiazide on 01/26/2021.  Goal of care/significant deconditioning: Patient extremely dependent with quadriparesis.  Chances of meaningful recovery very slim if there is any.  Palliative care was  consulted and finally family agreed to transition to full comfort care on 01/28/2021.  Appreciate  palliative care help.  Patient waiting for placement at [facility].  DVT prophylaxis:    Code Status: DNR  Family Communication: Daughter-in-law and son at bedside Status is: Inpatient  Remains inpatient appropriate because: Needs inpatient hospitalization.  Estimated body mass index is 34.96 kg/m as calculated from the following:   Height as of this encounter: 5\' 2"  (1.575 m).   Weight as of this encounter: 86.7 kg.  Nutritional Assessment: Body mass index is 34.96 kg/m.Marland Kitchen Seen by dietician.  I agree with the assessment and plan as outlined below: Nutrition Status: Nutrition Problem: Inadequate oral intake Etiology: acute illness Signs/Symptoms: NPO status Interventions: MVI, Tube feeding  .  Skin Assessment: I have examined the patient's skin and I agree with the wound assessment as performed by the wound care RN as outlined below:    Consultants:  None  Procedures:  None  Antimicrobials:  Anti-infectives (From admission, onward)    Start     Dose/Rate Route Frequency Ordered Stop   01/19/21 1145  cefTRIAXone (ROCEPHIN) 2 g in sodium chloride 0.9 % 100 mL IVPB  Status:  Discontinued        2 g 200 mL/hr over 30 Minutes Intravenous Every 24 hours 01/19/21 1052 01/19/21 1052   01/16/21 1400  Ampicillin-Sulbactam (UNASYN) 3 g in sodium chloride 0.9 % 100 mL IVPB  Status:  Discontinued        3 g 200 mL/hr over 30 Minutes Intravenous Every 6 hours 01/16/21 0816 01/19/21 1052   01/15/21 1400  piperacillin-tazobactam (ZOSYN) IVPB 3.375 g  Status:  Discontinued        3.375 g 12.5 mL/hr over 240 Minutes Intravenous Every 8 hours 01/15/21 1104 01/16/21 0816   01/15/21 0800  Ampicillin-Sulbactam (UNASYN) 3 g in sodium chloride 0.9 % 100 mL IVPB  Status:  Discontinued        3 g 200 mL/hr over 30 Minutes Intravenous Every 6 hours 01/15/21 0652 01/15/21 1104          Subjective: Patient seen and examined.  Son and daughter-in-law at the bedside.  Patient aphasic but  alert.  Objective: Vitals:   01/28/21 0515 01/28/21 1127 01/28/21 2051 01/29/21 0732  BP: (!) 152/98 (!) 141/73 (!) 149/104 136/61  Pulse: 97 79  84  Resp: (!) 22 20  20   Temp: 97.6 F (36.4 C) 97.9 F (36.6 C)  98 F (36.7 C)  TempSrc: Oral Oral  Oral  SpO2: 96% 96%  99%  Weight: 86.7 kg     Height:        Intake/Output Summary (Last 24 hours) at 01/29/2021 1151 Last data filed at 01/29/2021 0500 Gross per 24 hour  Intake 0 ml  Output 1950 ml  Net -1950 ml    Filed Weights   01/26/21 0432 01/27/21 0342 01/28/21 0515  Weight: 85.9 kg 97.5 kg 86.7 kg    Examination:  General exam: Appears calm and comfortable  Respiratory system: Clear to auscultation. Respiratory effort normal. Cardiovascular system: S1 & S2 heard, RRR. No JVD, murmurs, rubs, gallops or clicks. No pedal edema. Gastrointestinal system: Abdomen is nondistended, soft and nontender. No organomegaly or masses felt. Normal bowel sounds heard. Central nervous system: Alert but not oriented.  Aphasic.  Data Reviewed: I have personally reviewed following labs and imaging studies  CBC: Recent Labs  Lab 01/24/21 0229 01/25/21 0313 01/26/21 0345 01/27/21 0327 01/28/21 0347  WBC  17.2* 23.1* 20.4* 17.7* 14.0*  NEUTROABS 13.1*  --   --   --   --   HGB 13.1 12.7 11.6* 10.4* 11.9*  HCT 39.2 38.4 34.8* 33.0* 36.1  MCV 89.5 89.7 90.6 93.8 90.5  PLT 380 392 342 288 902    Basic Metabolic Panel: Recent Labs  Lab 01/23/21 0330 01/25/21 0313 01/26/21 0345 01/27/21 0327 01/27/21 1050 01/28/21 0555  NA 132* 131* 130* 131* 133* 133*  K 3.8 5.1 6.2* 6.9* 6.2* 3.8  CL 100 99 96* 97* 101 103  CO2 22 19* 20* 15* 19* 21*  GLUCOSE 172* 166* 156* 84 92 124*  BUN 25* 66* 101* 127* 110* 38*  CREATININE 0.82 2.39* 4.67* 6.62* 5.03* 1.26*  CALCIUM 9.4 9.1 9.1 8.6* 9.6 9.7  MG 2.0  --   --  2.6*  --  1.8  PHOS 4.4  --   --   --   --   --     GFR: Estimated Creatinine Clearance: 46.7 mL/min (A) (by C-G formula  based on SCr of 1.26 mg/dL (H)). Liver Function Tests: Recent Labs  Lab 01/23/21 0330  AST 49*  ALT 121*  ALKPHOS 69  BILITOT 0.5  PROT 7.0  ALBUMIN 2.9*    No results for input(s): LIPASE, AMYLASE in the last 168 hours. No results for input(s): AMMONIA in the last 168 hours.  Coagulation Profile: No results for input(s): INR, PROTIME in the last 168 hours. Cardiac Enzymes: Recent Labs  Lab 01/27/21 0327  CKTOTAL 68    BNP (last 3 results) No results for input(s): PROBNP in the last 8760 hours. HbA1C: No results for input(s): HGBA1C in the last 72 hours. CBG: Recent Labs  Lab 01/27/21 2342 01/28/21 0354 01/28/21 0743 01/28/21 1124 01/28/21 1607  GLUCAP 130* 107* 120* 123* 105*    Lipid Profile: No results for input(s): CHOL, HDL, LDLCALC, TRIG, CHOLHDL, LDLDIRECT in the last 72 hours. Thyroid Function Tests: No results for input(s): TSH, T4TOTAL, FREET4, T3FREE, THYROIDAB in the last 72 hours. Anemia Panel: No results for input(s): VITAMINB12, FOLATE, FERRITIN, TIBC, IRON, RETICCTPCT in the last 72 hours. Sepsis Labs: Recent Labs  Lab 01/23/21 0330  PROCALCITON <0.10     No results found for this or any previous visit (from the past 240 hour(s)).      Radiology Studies: No results found.  Scheduled Meds:  antiseptic oral rinse  15 mL Topical BID   feeding supplement (NEPRO CARB STEADY)  1,000 mL Oral Q24H   QUEtiapine  150 mg Per Tube QHS   Continuous Infusions:     LOS: 14 days   Time spent: 28 minutes   Darliss Cheney, MD [facility]  01/29/2021, 11:51 AM  Please page via [facility] and do not message via secure chat for anything urgent. Secure chat can be used for anything non urgent.  How to contact the [facility] Attending or Consulting provider [place] or covering provider during after hours [place], for this patient?  Check the care team in [facility] and look for a) attending/consulting TRH provider listed and b) the [facility] team listed. Page or  secure chat [phone]. Log into [email or web address] and use [place]'s universal password to access. If you do not have the password, please contact the hospital operator. Locate the [facility] provider you are looking for under [facility] and page to a number that you can be directly reached. If you still have difficulty reaching the provider, please page the [facility] (Director  on Call) for the Hospitalists listed on amion for assistance.

## 2021-01-29 NOTE — Plan of Care (Signed)

## 2021-01-29 NOTE — Progress Notes (Signed)
[facility]) Hospital Liaison Note  Referral received for [facility] services. MSW to meet with family at bedside on 10/20 @ 12:30 pm.   Please call with any questions/concerns.    Thank you for the opportunity to participate in this patient's care.    Daphene Calamity, MSW [facility] Liaison  [phone]

## 2021-01-29 NOTE — Progress Notes (Signed)
[facility]) Hospital Liaison Note   Received request from Transitions of Care Manager for family interest in [facility]. Chart reviewed and spoke with family to acknowledge referral. Patient hospice eligibility status is approved. Unfortunately, [facility] is not able to offer a room today. Family and Transitions of Care Manager aware hospital liaison will follow up tomorrow or sooner if room becomes available. Please do not hesitate to call with questions.    Please call with any questions/concerns.    Thank you for the opportunity to participate in this patient's care.   Daphene Calamity, MSW [facility] Liaison  [phone]

## 2021-01-30 NOTE — TOC Progression Note (Signed)
Transition of Care [facility]) - Progression Note    Patient Details  Name: Alexis Ball MRN: [id number] Date of Birth: 06/27/1955  Transition of Care [facility]) CM/SW Contact  Joanne Chars, LCSW Phone Number: 01/30/2021, 2:12 PM  Clinical Narrative:  CSW received call from [name].  She would like to pursue possible bed at residential facility in [facility].  CSW contacted Maura at [place] and made referral.     Expected Discharge Plan: [place] Barriers to Discharge: Continued Medical Work up  Expected Discharge Plan and Services Expected Discharge Plan: [place] In-house Referral: Clinical Social Work     Living arrangements for the past 2 months: Single Family Home                                       Social Determinants of Health (SDOH) Interventions    Readmission Risk Interventions No flowsheet data found.

## 2021-01-30 NOTE — Progress Notes (Signed)
Patient ID: Alexis Ball, female   DOB: 08-27-1955, 65 y.o.   MRN: [id number]  PROGRESS NOTE    Alexis Ball  POE:[id number] DOB: [birth year] DOA: 01/15/2021 PCP: Arthur Holms, NP    Brief Narrative:  65 year old with extensive past medical history of GERD, CVA, DVT, hypertension, iron deficiency anemia, prediabetes, who was found on her porch by a neighbor early a.m. 01/15/2021 unresponsive EMS arrived to find PEA arrest she required intubation multiple rounds of epinephrine chest compressions and coded again in the emergency department x2.  Pulmonary critical care asked to admit, note she had drug paraphernalia around her, so this is presumed drug overdose.  She was admitted under ICU/PCCM and transferred to [facility] on 01/23/2021   Significant Hospital Events: Including procedures, antibiotic start and stop dates in addition to other pertinent events   01/15/2021 found down in PEA, on epi/ NE, re-arrest x 2 in ER, starting zosyn for aspiration pna, off pressors overnight 10/7 intermittently agitated, on precedex, prn versed, LTM d/c'd- neg seizure, MRI brain 10/12 Extubated yesterday, agitated requiring Precedex overnight    01/15/2021 CTH / cervical > no intracranial/ acute trauma findings to cervical, multilevel spinal degenerative changes greatest C5-6, dense consolidative changes at lung apices 01/15/2021 CTA chest/abd/ pelvis > neg dissection/ aneurysm, bilateral dependent lung consolidation, borderline to mild cardiomegaly, RCF venous catheter, minimally displaced anterior left 2nd rib fx 01/15/2021 lower extremity Doppler> negative 10/7 MRI brain >> non acute   10/6 SARS/ flu >neg 10/6 MRSA pcr > neg 10/6 Bcx2 >>neg 10/6 UC >>neg 10/7 trach asp >klebsiella pna   10/6 zosyn  10/7 unasyn >10/10    Assessment & Plan:   Active Problems:   Cardiac arrest ([place])   Respiratory failure (HCC)   Accidental drug overdose   Encephalopathy acute   History of ETT   Delirium  Acute  encephalopathy, metabolic/ toxic and given multiple arrest, concern for anoxic injury.  Initial CT head and MRI brain neg.  EEG without seizues. Cocaine abuse: Precedex discontinued on 01/21/2021.  Patient with aphasia.  Spontaneously moving some of the extremities but does not seem to have meaningful power in any of the extremities.  Patient was transitioned to comfort care on 01/28/2021.  Awaiting placement at [facility].   PEA cardiac arrest- unclear etiology, possibly secondary to cocaine vs hypokalemia. TTE with EF 60-65%, normal RV, valves ok, no wall motion abnormalities. LE duplex neg and normal RV on TTE, very low risk of PE.  Continue supportive care.   Acute hypoxic respiratory insufficiency in the setting of cardiac arrest and possible aspiration pneumonia: Sputum grew Klebsiella extubated on 01/20/2021.  Currently on room air.  Completed course of Unasyn.   Transaminitis: suspect from post arrest shock liver.  Improving.   Hyperglycemia: Controlled.  Continue Semglee 10 units and continue SSI.   Dysphagia/nutrition:  SLP following.  Remains NPO.  Failing swallow.  Family wants to continue to feeding symptoms she is discharged to [facility].   Acute renal failure with hyperkalemia: Creatinine jumped to 6.5 and potassium to 6.9 despite of treating hyperkalemia with hyperkalemia protocol on 01/27/2021, indwelling Foley catheter was inserted, patient put out 1250 cc right away.  Patient put out about 10 L of urine in following 24 hours.  Her creatinine has dramatically improved to 1.2 today and hyperkalemia resolved as well.  Nephrology was consulted but then they signed off.   Essential hypertension: Controlled, continue amlodipine 10 mg daily, hydralazine 50 mg every 8 hours, discontinued Coreg  and hydrochlorothiazide on 01/26/2021.   Goal of care/significant deconditioning: Patient extremely dependent with quadriparesis.  Chances of meaningful recovery very slim if there is any.   Palliative care was consulted and finally family agreed to transition to full comfort care on 01/28/2021.  Appreciate palliative care help.  Patient [facility] at [facility].   DVT prophylaxis: None:Comfort Care Code Status: DNR  Family Communication: none at bedside Disposition Plan: Hospice inpt   Consultants:  CCM Nephrology Palliative care  Procedures: Intubation Echo Lower extremity Dopplers   Antimicrobials: Anti-infectives (From admission, onward)    Start     Dose/Rate Route Frequency Ordered Stop   01/19/21 1145  cefTRIAXone (ROCEPHIN) 2 g in sodium chloride 0.9 % 100 mL IVPB  Status:  Discontinued        2 g 200 mL/hr over 30 Minutes Intravenous Every 24 hours 01/19/21 1052 01/19/21 1052   01/16/21 1400  Ampicillin-Sulbactam (UNASYN) 3 g in sodium chloride 0.9 % 100 mL IVPB  Status:  Discontinued        3 g 200 mL/hr over 30 Minutes Intravenous Every 6 hours 01/16/21 0816 01/19/21 1052   01/15/21 1400  piperacillin-tazobactam (ZOSYN) IVPB 3.375 g  Status:  Discontinued        3.375 g 12.5 mL/hr over 240 Minutes Intravenous Every 8 hours 01/15/21 1104 01/16/21 0816   01/15/21 0800  Ampicillin-Sulbactam (UNASYN) 3 g in sodium chloride 0.9 % 100 mL IVPB  Status:  Discontinued        3 g 200 mL/hr over 30 Minutes Intravenous Every 6 hours 01/15/21 0652 01/15/21 1104        Subjective: Patient is coming in bed this morning.  She will make eye contact but does not verbalize  Objective: Vitals:   01/28/21 1127 01/28/21 2051 01/29/21 0732 01/30/21 0758  BP: (!) 141/73 (!) 149/104 136/61 125/83  Pulse: 79  84 88  Resp: 20  20   Temp: 97.9 F (36.6 C)  98 F (36.7 C) (!) 97.5 F (36.4 C)  TempSrc: Oral  Oral Oral  SpO2: 96%  99% 100%  Weight:      Height:        Intake/Output Summary (Last 24 hours) at 01/30/2021 1007 Last data filed at 01/30/2021 0936 Gross per 24 hour  Intake 0 ml  Output 650 ml  Net -650 ml   Filed Weights   01/26/21  0432 01/27/21 0342 01/28/21 0515  Weight: 85.9 kg 97.5 kg 86.7 kg    Examination:  General exam: Appears calm and comfortable  Respiratory system: Clear to auscultation. Respiratory effort normal. Cardiovascular system: S1 & S2 heard, RRR.  Gastrointestinal system: Abdomen is nondistended, soft and nontender.  Central nervous system: Alert but not oriented Extremities: Symmetric  Skin: No rashes     Data Reviewed: I have personally reviewed following labs and imaging studies  CBC: Recent Labs  Lab 01/24/21 0229 01/25/21 0313 01/26/21 0345 01/27/21 0327 01/28/21 0347  WBC 17.2* 23.1* 20.4* 17.7* 14.0*  NEUTROABS 13.1*  --   --   --   --   HGB 13.1 12.7 11.6* 10.4* 11.9*  HCT 39.2 38.4 34.8* 33.0* 36.1  MCV 89.5 89.7 90.6 93.8 90.5  PLT 380 392 342 288 833   Basic Metabolic Panel: Recent Labs  Lab 01/25/21 0313 01/26/21 0345 01/27/21 0327 01/27/21 1050 01/28/21 0555  NA 131* 130* 131* 133* 133*  K 5.1 6.2* 6.9* 6.2* 3.8  CL 99 96* 97* 101 103  CO2 19* 20* 15* 19* 21*  GLUCOSE 166* 156* 84 92 124*  BUN 66* 101* 127* 110* 38*  CREATININE 2.39* 4.67* 6.62* 5.03* 1.26*  CALCIUM 9.1 9.1 8.6* 9.6 9.7  MG  --   --  2.6*  --  1.8    Cardiac Enzymes: Recent Labs  Lab 01/27/21 0327  CKTOTAL 68    CBG: Recent Labs  Lab 01/27/21 2342 01/28/21 0354 01/28/21 0743 01/28/21 1124 01/28/21 1607  GLUCAP 130* 107* 120* 123* 105*    Scheduled Meds:  antiseptic oral rinse  15 mL Topical BID   QUEtiapine  150 mg Per Tube QHS   Continuous Infusions:   LOS: 15 days    Donnamae Jude, MD 01/30/2021 10:07 AM [phone] [facility] If 7PM-7AM, please contact night-coverage 01/30/2021, 10:07 AM

## 2021-01-30 NOTE — Progress Notes (Addendum)
[facility]) Hospital Liaison Note  Unfortunately, [facility] is not able to offer a room today. Family and Transitions of Care Manager aware hospital liaison will follow up tomorrow or sooner if room becomes available. Please do not hesitate to call with questions.    Please call with any questions/concerns.    Thank you for the opportunity to participate in this patient's care.  Addendum:  11:05: MSW met with patient, family/brother at bedside, patient resting comfortably showing no signs of pain/discomfort. MSW contacted dtr in law/Brittany to provide updates no bed availability, no answer. VM left requesting call be returned.  1:15: MSW contacted [name] and shared above updates. [name] appreciative. MSW provide contact number for updates over the weekend.   Daphene Calamity, MSW [facility] Liaison  [phone]

## 2021-01-30 NOTE — TOC Progression Note (Signed)
Transition of Care [facility]) - Progression Note    Patient Details  Name: Alexis Ball MRN: [id number] Date of Birth: 1956-03-18  Transition of Care [facility]) CM/SW Contact  Reece Agar, [place] Phone Number: 01/30/2021, 12:24 PM  Clinical Narrative:    CSW contacted pt daughter to inform her of [facility] not having availability. CSW shared that [name] does currently have availability. Pt daughter states that she does not know about [facility] because many of their family members cannot get to [facility] to visit. Pt daughter would like to contact CSW back to follow up on decision for Hospice.  Expected Discharge Plan: [place] Barriers to Discharge: Continued Medical Work up  Expected Discharge Plan and Services Expected Discharge Plan: [place] In-house Referral: Clinical Social Work     Living arrangements for the past 2 months: Single Family Home                                       Social Determinants of Health (SDOH) Interventions    Readmission Risk Interventions No flowsheet data found.

## 2021-01-31 DIAGNOSIS — Z9289 Personal history of other medical treatment: Secondary | ICD-10-CM

## 2021-01-31 DIAGNOSIS — G931 Anoxic brain damage, not elsewhere classified: Secondary | ICD-10-CM

## 2021-01-31 LAB — RESP PANEL BY RT-PCR (FLU A&B, COVID) ARPGX2
Influenza A by PCR: NEGATIVE
Influenza B by PCR: NEGATIVE
SARS Coronavirus 2 by RT PCR: NEGATIVE

## 2021-01-31 MED ORDER — HYDROMORPHONE HCL 1 MG/ML IJ SOLN: 0.5000 mg | INTRAMUSCULAR | 0 refills | Status: AC | PRN

## 2021-01-31 MED ORDER — HALOPERIDOL LACTATE 2 MG/ML PO CONC: 2.0000 mg | Freq: Four times a day (QID) | ORAL | 0 refills | Status: AC | PRN

## 2021-01-31 MED ORDER — GLYCOPYRROLATE 0.2 MG/ML IJ SOLN: 0.2000 mg | INTRAMUSCULAR | 0 refills | Status: AC | PRN

## 2021-01-31 NOTE — TOC Transition Note (Signed)
Transition of Care [facility]) - CM/SW Discharge Note   Patient Details  Name: Alexis Ball MRN: [id number] Date of Birth: 20-Nov-1955  Transition of Care [facility]) CM/SW Contact:  Bary Castilla, LCSW Phone Number: [phone] 01/31/2021, 3:46 PM   Clinical Narrative:    Patient will DC to: [facility]? Anticipated DC date:?01/31/2021 Family notified:?[name] Transport by: Corey Harold   Per MD patient ready for DC to [facility] RN, patient, patient's family, and facility notified of DC. Discharge Summary sent to facility. RN given number for report  [phone] [phone] DC packet on chart. Ambulance transport requested for patient.   CSW signing off.   [place], [place] [phone]    Final next level of care: Hospice Medical Facility Barriers to Discharge: No Barriers Identified   Patient Goals and CMS Choice   CMS Medicare.gov Compare Post Acute Care list provided to:: Patient Represenative (must comment) (Spoke with patients daughter [name]) Choice offered to / list presented to : Adult Children (patietns daughter [name])  Discharge Placement              Patient chooses bed at:  [facility]) Patient to be transferred to facility by: [place] Name of family member notified: Natisha Patient and family notified of of transfer: 01/31/21  Discharge Plan and Services In-house Referral: Clinical Social Work                                   Social Determinants of Health (SDOH) Interventions     Readmission Risk Interventions No flowsheet data found.

## 2021-01-31 NOTE — TOC Progression Note (Signed)
Transition of Care [facility]) - Progression Note    Patient Details  Name: Alexis Ball MRN: [id number] Date of Birth: [birth year]  Transition of Care [facility]) CM/SW [place], [place] Phone Number: [phone] 01/31/2021, 9:50 AM  Clinical Narrative:     CSW called pt's daughter Alexis Ball to ascertain Natisha's first choice for residential hospice facility. CSW followed up with Misty from [facility] to confirm choice. Misty stated that [facility] has a bed therefore she will follow up with daughter.  TOC team will continue to assist with discharge planning needs.   Expected Discharge Plan: [place] Barriers to Discharge: Continued Medical Work up  Expected Discharge Plan and Services Expected Discharge Plan: [place] In-house Referral: Clinical Social Work     Living arrangements for the past 2 months: Single Family Home                                       Social Determinants of Health (SDOH) Interventions    Readmission Risk Interventions No flowsheet data found.

## 2021-01-31 NOTE — Discharge Summary (Signed)
Physician Discharge Summary  Patient ID: Alexis Ball MRN: [id number] DOB/AGE: 07-25-1955 65 y.o.  Admit date: 01/15/2021 Discharge date: 01/31/2021  Admission Diagnoses:  Discharge Diagnoses:  Active Problems:   Cardiac arrest ([place])   Respiratory failure (HCC)   Accidental drug overdose   Encephalopathy acute   History of ETT   Delirium   Discharged Condition: stable  Hospital Course: Patient is a 65 year old with past medical history significant for GERD, CVA, DVT, hypertension, iron deficiency anemia and prediabetes.  Patient was found on her porch by a neighbor early in the morning unresponsive.  EMS arrived to find patient in PEA arrest.  Patient required intubation, multiple rounds of epinephrine and chest compressions.  Patient coded again in the emergency department x2.  Patient was initially admitted under the pulmonary and critical care team and was later transferred to the hospitalist team.  Patient was noted to have drug paraphernalia around her.     Significant Hospital Events: Including procedures, antibiotic start and stop dates in addition to other pertinent events   01/15/2021 found down in PEA, on epi/ NE, re-arrest x 2 in ER, starting zosyn for aspiration pna, off pressors overnight 10/7 intermittently agitated, on precedex, prn versed, LTM d/c'd- neg seizure, MRI brain 10/12 Extubated yesterday, agitated requiring Precedex overnight    01/15/2021 CTH / cervical > no intracranial/ acute trauma findings to cervical, multilevel spinal degenerative changes greatest C5-6, dense consolidative changes at lung apices 01/15/2021 CTA chest/abd/ pelvis > neg dissection/ aneurysm, bilateral dependent lung consolidation, borderline to mild cardiomegaly, RCF venous catheter, minimally displaced anterior left 2nd rib fx 01/15/2021 lower extremity Doppler> negative 10/7 MRI brain >> non acute   10/6 SARS/ flu >neg 10/6 MRSA pcr > neg 10/6 Bcx2 >>neg 10/6 UC >>neg 10/7 trach  asp >klebsiella pna   10/6 zosyn  10/7 unasyn >10/10   Acute encephalopathy, metabolic/ toxic and given multiple arrest, concern for anoxic injury.  -Initial CT head and MRI brain neg.  EEG without seizues. Cocaine abuse: Precedex was discontinued on 01/21/2021.  Patient with aphasia.  Spontaneously moving some of the extremities but does not seem to have meaningful power in any of the extremities.  Patient was transitioned to comfort care on 01/28/2021.  Patient will be discharged [facility].   PEA cardiac arrest- unclear etiology, possibly secondary to cocaine vs hypokalemia. TTE with EF 60-65%, normal RV, valves ok, no wall motion abnormalities. LE duplex neg and normal RV on TTE, very low risk of PE.     Acute hypoxic respiratory insufficiency in the setting of cardiac arrest and possible aspiration pneumonia: Sputum grew Klebsiella extubated on 01/20/2021.  Currently on room air.  Completed course of Unasyn.   Transaminitis: suspect from post arrest shock liver.  Improving.   Hyperglycemia: Controlled.  Patient was continued on Semglee 10 units and continue SSI.   Acute renal failure with hyperkalemia: Creatinine jumped to 6.5 and potassium to 6.9 despite of treating hyperkalemia with hyperkalemia protocol on 01/27/2021.  Indwelling Foley catheter was inserted, patient put out 1250 cc right away.  Patient put out about 10 L of urine in following 24 hours.  Her creatinine has dramatically improved to 1.2 today and hyperkalemia resolved as well.  Nephrology was consulted but then they signed off.   Essential hypertension: Controlled,   Goal of care/significant deconditioning: Patient extremely dependent with quadriparesis.  Chances of meaningful recovery very slim if there is any.  Palliative care was consulted and finally family agreed to transition to  full comfort care on 01/28/2021.  Appreciate palliative care help.  Patient will be discharged to [facility].    Consults: nephrology  and Palliative care team.   Discharge Exam: Blood pressure (!) 144/91, pulse 67, temperature (!) 97.5 F (36.4 C), temperature source Oral, resp. rate 20, height $RemoveBe'5\' 2"'jnevRLejq$  (1.575 m), weight 86.7 kg, SpO2 96 %.  Disposition: Discharge disposition: 51-Hospice/Medical Facility       Discharge Instructions     Diet - low sodium heart healthy   Complete by: As directed    Discharge wound care:   Complete by: As directed    Continue current plan   Increase activity slowly   Complete by: As directed       Allergies as of 01/31/2021       Reactions   Iodine Itching   Per pt when put on skin        Medication List     STOP taking these medications    amLODipine 5 MG tablet Commonly known as: NORVASC   azelastine 0.1 % nasal spray Commonly known as: ASTELIN   bimatoprost 0.01 % Soln Commonly known as: LUMIGAN   brimonidine 0.15 % ophthalmic solution Commonly known as: ALPHAGAN   calcium carbonate 500 MG chewable tablet Commonly known as: TUMS - dosed in mg elemental calcium   carboxymethylcellulose 0.5 % Soln Commonly known as: REFRESH PLUS   cetirizine 10 MG tablet Commonly known as: ZYRTEC   cyclobenzaprine 10 MG tablet Commonly known as: FLEXERIL   dorzolamide-timolol 22.3-6.8 MG/ML ophthalmic solution Commonly known as: COSOPT   ferrous sulfate 325 (65 FE) MG tablet   Fish Oil 1000 MG Caps   gabapentin 300 MG capsule Commonly known as: NEURONTIN   hydrochlorothiazide 25 MG tablet Commonly known as: HYDRODIURIL   ibuprofen 600 MG tablet Commonly known as: ADVIL   memantine 10 MG tablet Commonly known as: NAMENDA   multivitamin capsule   Narcan 4 MG/0.1ML Liqd nasal spray kit Generic drug: naloxone   ondansetron 4 MG tablet Commonly known as: Zofran   oxyCODONE-acetaminophen 5-325 MG tablet Commonly known as: Percocet   potassium chloride SA 20 MEQ tablet Commonly known as: KLOR-CON   REFRESH OP   rosuvastatin 10 MG  tablet Commonly known as: CRESTOR   silver sulfADIAZINE 1 % cream Commonly known as: Silvadene       TAKE these medications    glycopyrrolate 0.2 MG/ML injection Commonly known as: ROBINUL Inject 1 mL (0.2 mg total) into the skin every 4 (four) hours as needed for up to 7 days (excessive secretions).   haloperidol 2 MG/ML solution Commonly known as: HALDOL Place 1 mL (2 mg total) under the tongue every 6 (six) hours as needed for up to 7 days for agitation (or delirium).   HYDROmorphone 1 MG/ML injection Commonly known as: DILAUDID Inject 0.5-1 mLs (0.5-1 mg total) into the vein every 2 (two) hours as needed for moderate pain or severe pain (dyspnea, increased work of breathing, distress, respiratory rate >25).               Discharge Care Instructions  (From admission, onward)           Start     Ordered   01/31/21 0000  Discharge wound care:       Comments: Continue current plan   01/31/21 1500             Signed: Bonnell Public 01/31/2021, 3:01 PM

## 2021-02-10 DEATH — deceased

## 2021-03-31 ENCOUNTER — Ambulatory Visit: Payer: Self-pay | Admitting: Podiatry

## 2022-03-18 IMAGING — MR MR HEAD W/O CM
9 of 10 series · 38 of 48 positions shown · non-contrast
Comparison: No prior MRI brain, correlation is made with 01/15/2021
CT head

CLINICAL DATA: Cardiac arrest, stroke suspected

EXAM:
MRI HEAD WITHOUT CONTRAST
TECHNIQUE: Multiplanar, multiecho pulse sequences of the brain and surrounding
structures were obtained without intravenous contrast.

[Series 3: DWI · axial · 3.0mm · 1.09mm/px · z∈[-72,+75]mm · 11 of 100 slices shown (1 of 4)]
[im 1/100]
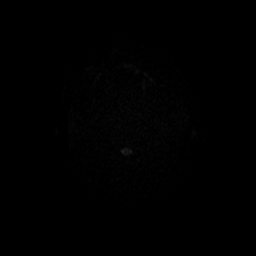
[im 10/100]
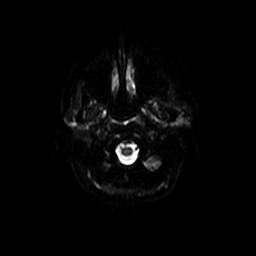
[im 20/100]
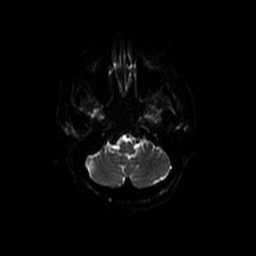
[im 30/100]
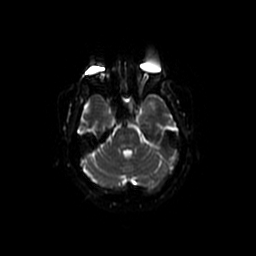
[im 40/100]
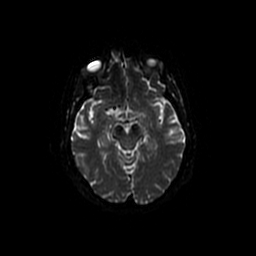
[im 50/100]
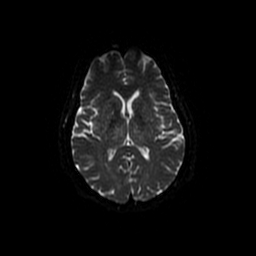
[im 60/100]
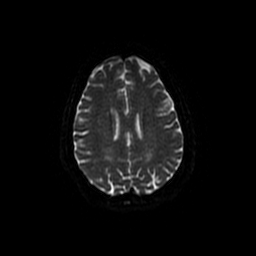
[im 70/100]
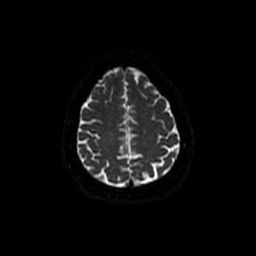
[im 80/100]
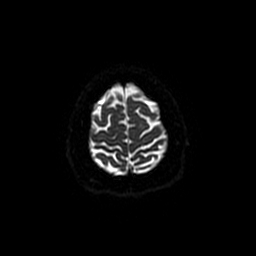
[im 90/100]
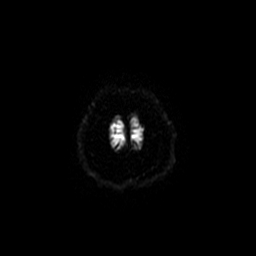
[im 100/100]
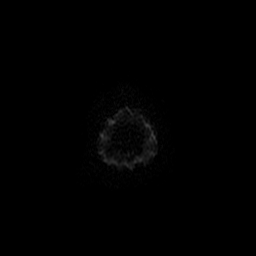

[Series 4: DWI · coronal · 5.0mm · 1.09mm/px · 7 of 74 slices shown (2 of 4)]
[im 1/74]
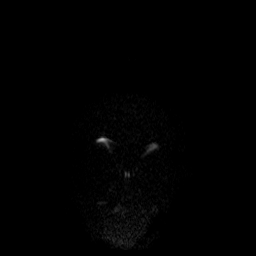
[im 13/74]
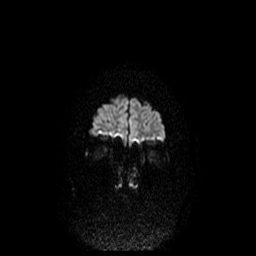
[im 25/74]
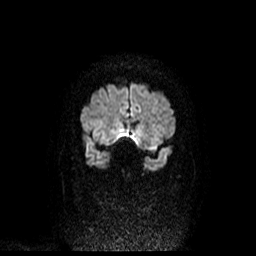
[im 37/74]
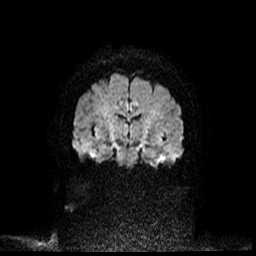
[im 49/74]
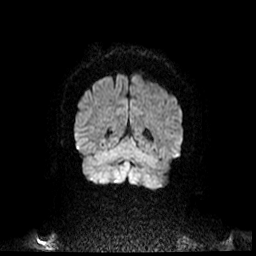
[im 61/74]
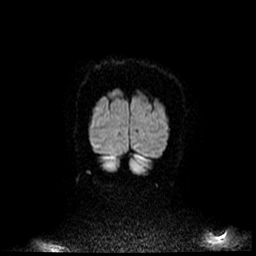
[im 74/74]
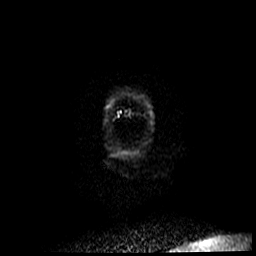

[Series 5: T1 · sagittal · 5.0mm · 0.47mm/px · 2 of 23 slices shown (1 of 2)]
[im 1/23]
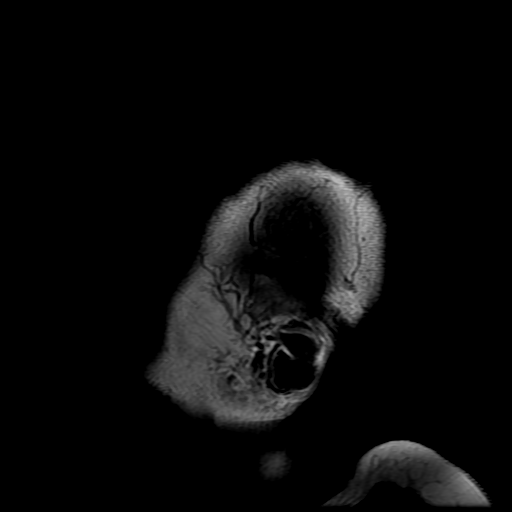
[im 23/23]
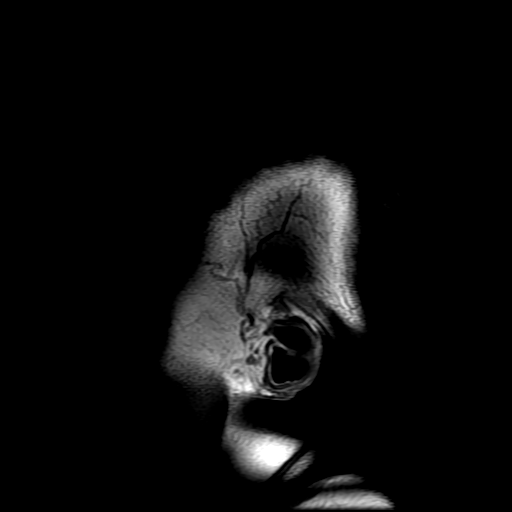

[Series 6: T2 · axial · 5.0mm · 0.43mm/px · z∈[-70,+74]mm · 2 of 25 slices shown (1 of 2)]
[im 1/25]
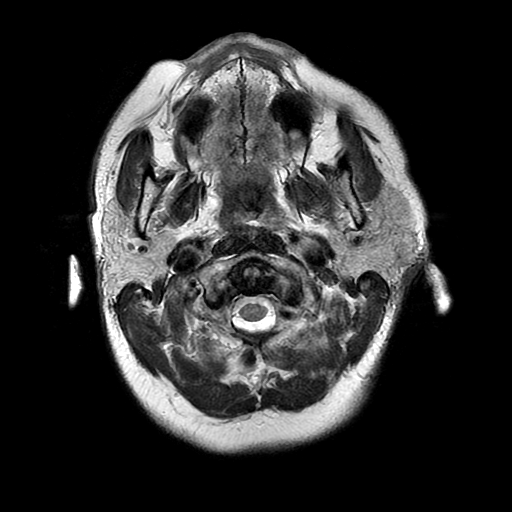
[im 25/25]
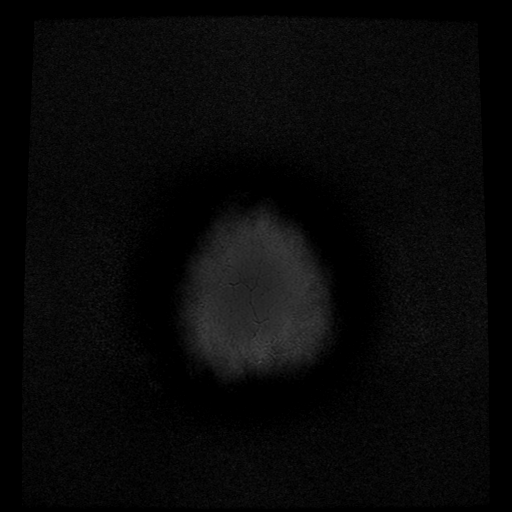

[Series 7: FLAIR · axial · 3.0mm · 0.43mm/px · z∈[-70,+74]mm · 2 of 25 slices shown]
[im 1/25]
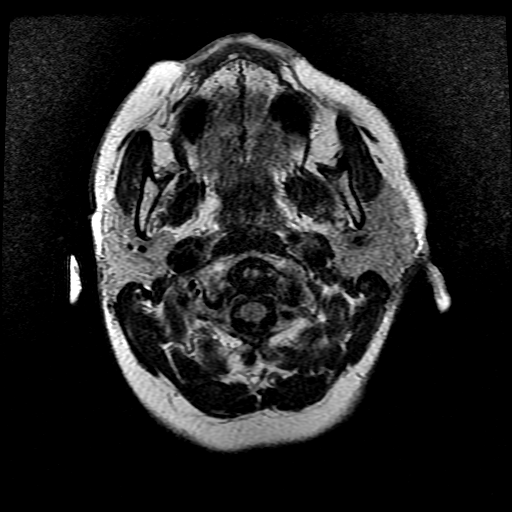
[im 25/25]
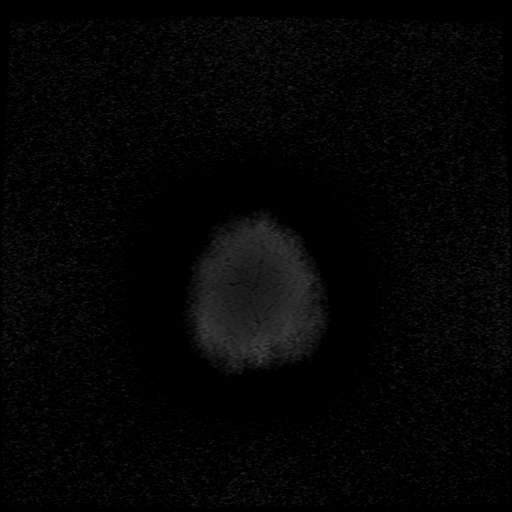

[Series 9: T1 · axial · 3.0mm · 0.47mm/px · z∈[-71,-55]mm · 2 of 100 slices shown (2 of 2)]
[im 1/100]
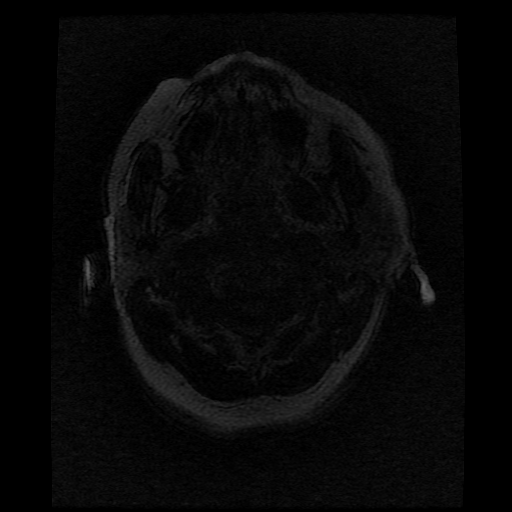
[im 12/100]
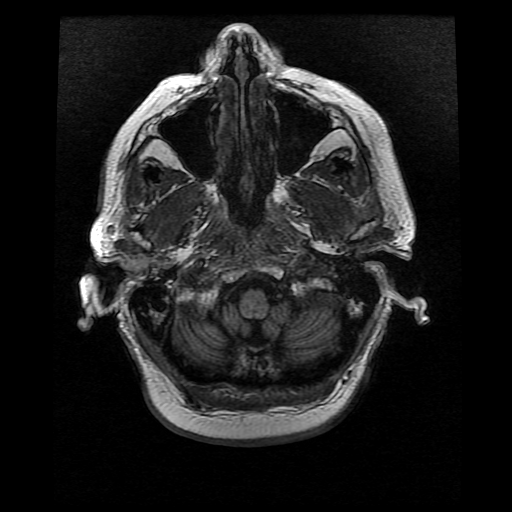

[Series 10: T2 · coronal · 5.0mm · 0.39mm/px · 3 of 28 slices shown (2 of 2)]
[im 1/28]
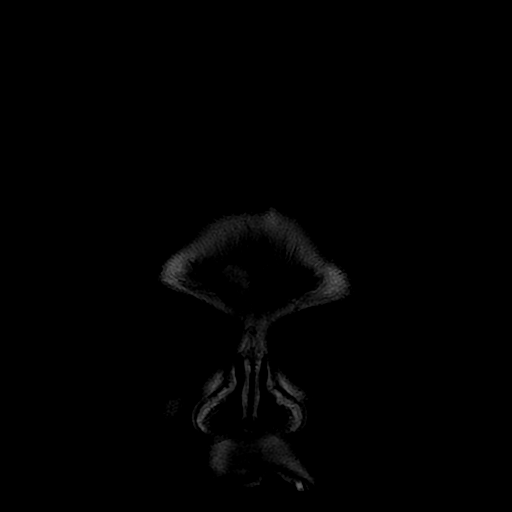
[im 14/28]
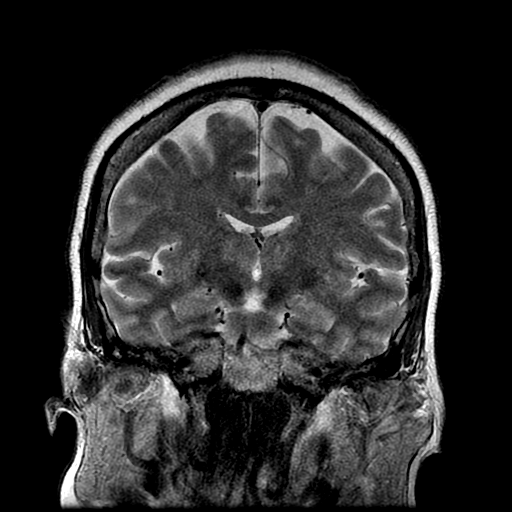
[im 28/28]
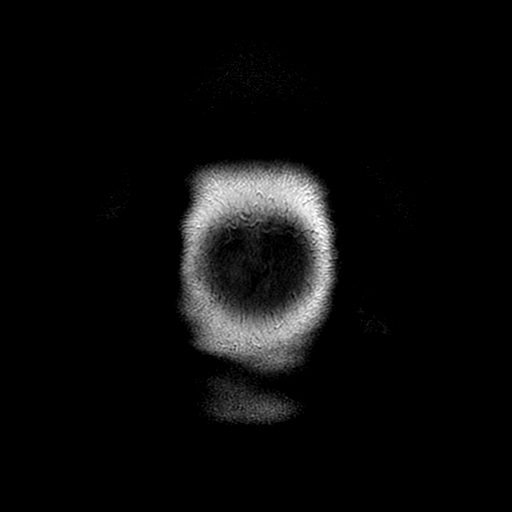

[Series 300: DWI · axial · 3.0mm · 1.09mm/px · z∈[-72,+75]mm · 5 of 50 slices shown (3 of 4)]
[im 1/50]
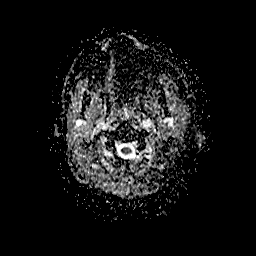
[im 13/50]
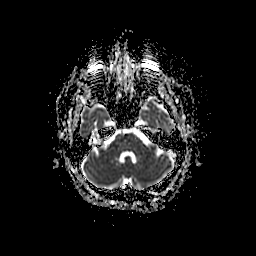
[im 25/50]
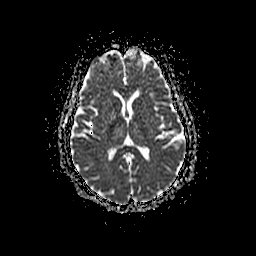
[im 37/50]
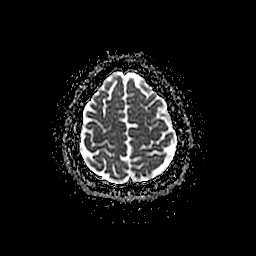
[im 50/50]
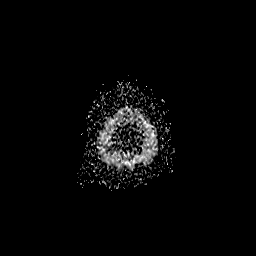

[Series 400: DWI · coronal · 5.0mm · 1.09mm/px · 4 of 37 slices shown (4 of 4)]
[im 1/37]
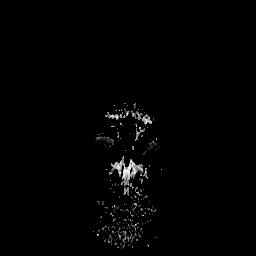
[im 13/37]
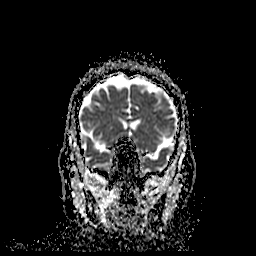
[im 25/37]
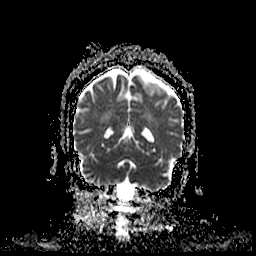
[im 37/37]
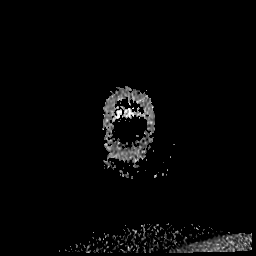

[38 of 48 positions shown; findings below may reference images not displayed]

FINDINGS: Brain: No acute infarction, hemorrhage, hydrocephalus, extra-axial
collection or mass lesion. Partial empty sella. No foci of
susceptibility to suggest hemorrhage. T2 hyperintense signal in the
periventricular white matter, likely the sequela of chronic small
vessel ischemic disease.

Vascular: Normal flow voids.

Skull and upper cervical spine: Normal marrow signal.

Sinuses/Orbits: Fluid-fluid level in the right frontal sinus.
Mucosal thickening in the anterior ethmoid air cells. The sinuses
are otherwise unremarkable. Status post bilateral lens replacements.

Other: The mastoids are well aerated.
IMPRESSION: No acute intracranial process.

## 2022-03-19 IMAGING — DX DG CHEST 1V PORT
1 series · 1 of 1 positions shown · non-contrast
Comparison: 01/15/2021 chest radiograph.

CLINICAL DATA: Dyspnea

EXAM:
PORTABLE CHEST 1 VIEW

[chest]
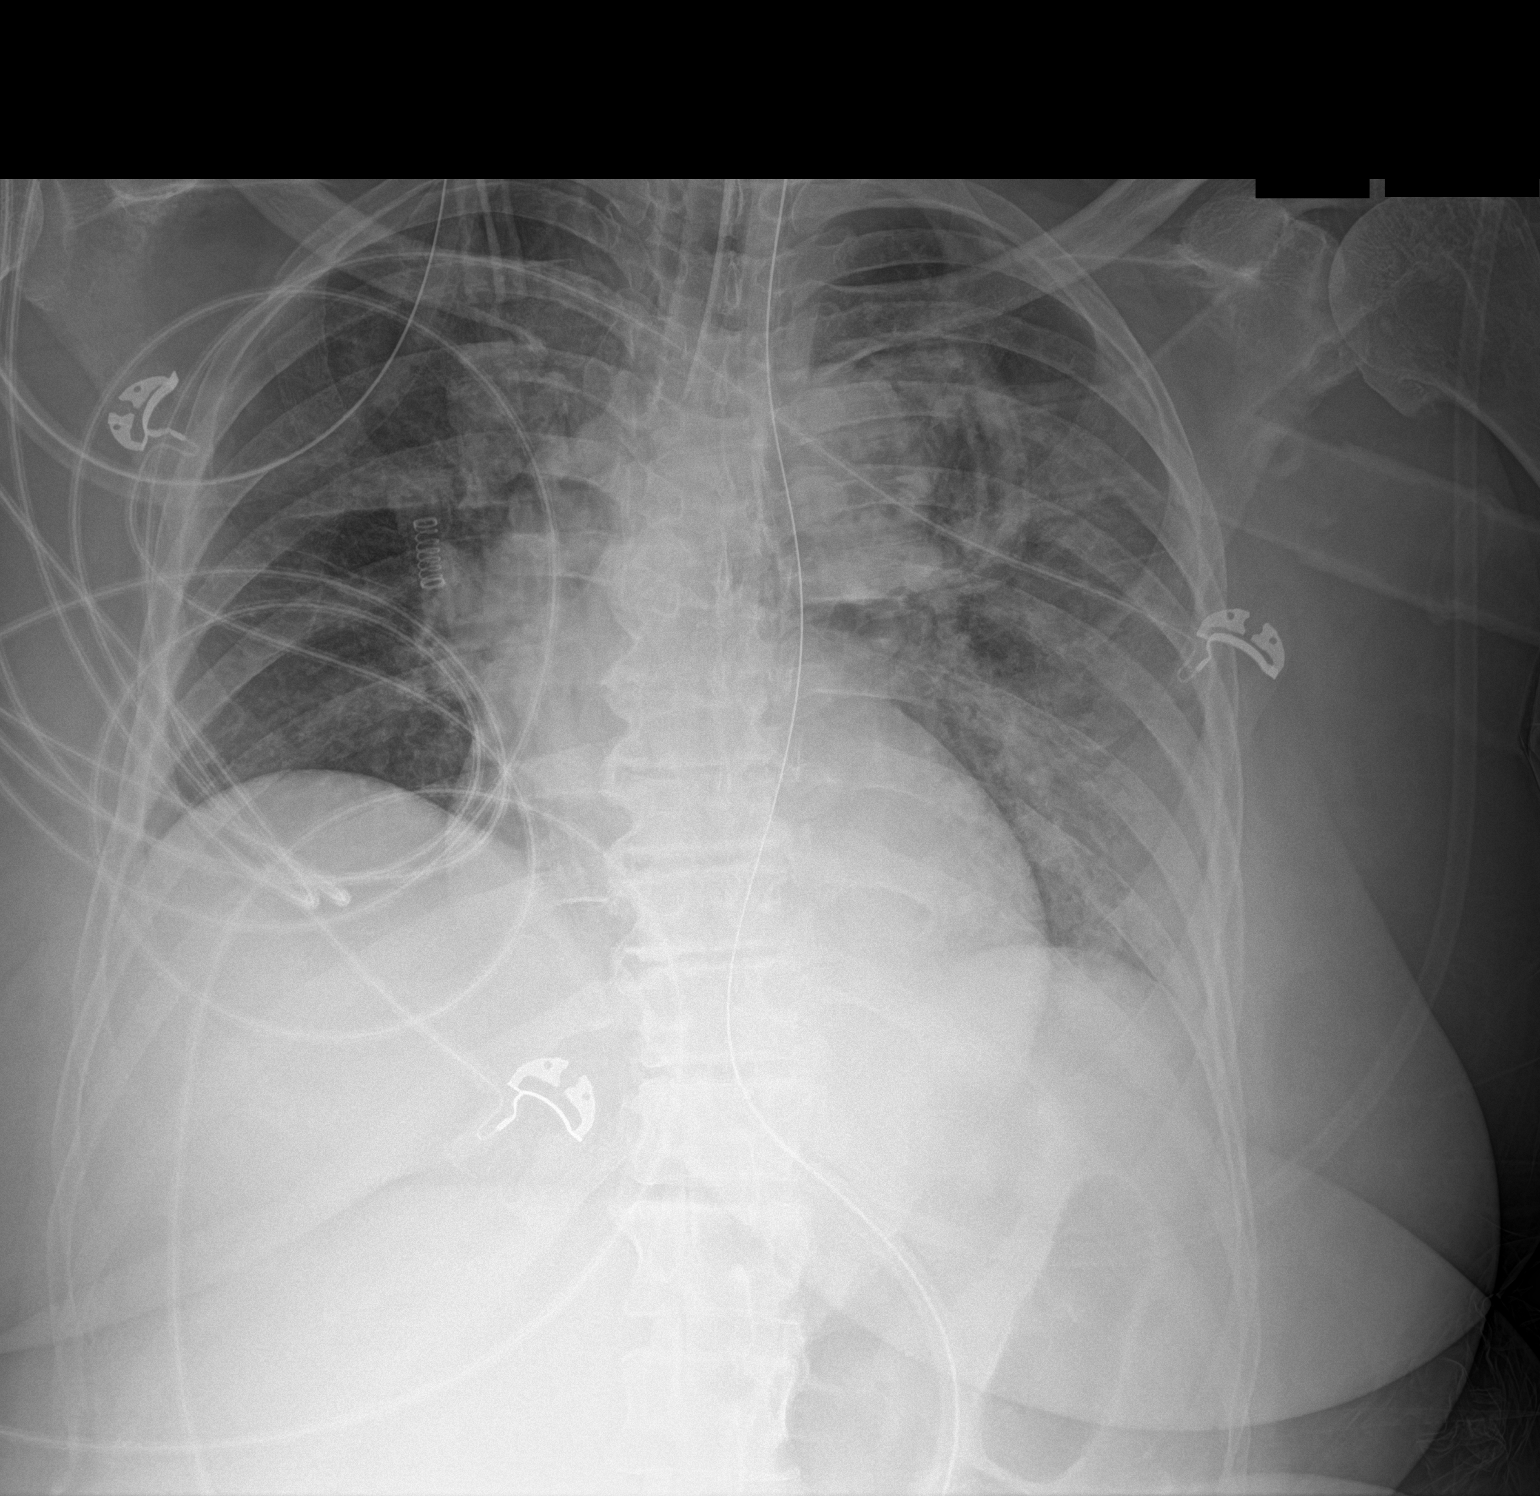

[1 of 1 positions shown; findings below may reference images not displayed]

FINDINGS: Endotracheal tube tip is 2.4 cm above the carina. Enteric tube
enters stomach with the tip not seen on this image. Stable
cardiomediastinal silhouette with normal heart size. No
pneumothorax. No pleural effusion. Similar mild hazy left lung base
opacity, favor atelectasis. No overt pulmonary edema.
IMPRESSION: 1. Well-positioned support structures.
2. Similar mild hazy left lung base opacity, favor atelectasis.

## 2022-03-22 IMAGING — DX DG CHEST 1V PORT
1 series · 1 of 1 positions shown · non-contrast
Comparison: Portable exam 0747 hours compared to 01/17/2021

CLINICAL DATA: Of a shin, post CPR, drug overdose

EXAM:
PORTABLE CHEST 1 VIEW

[chest ap]
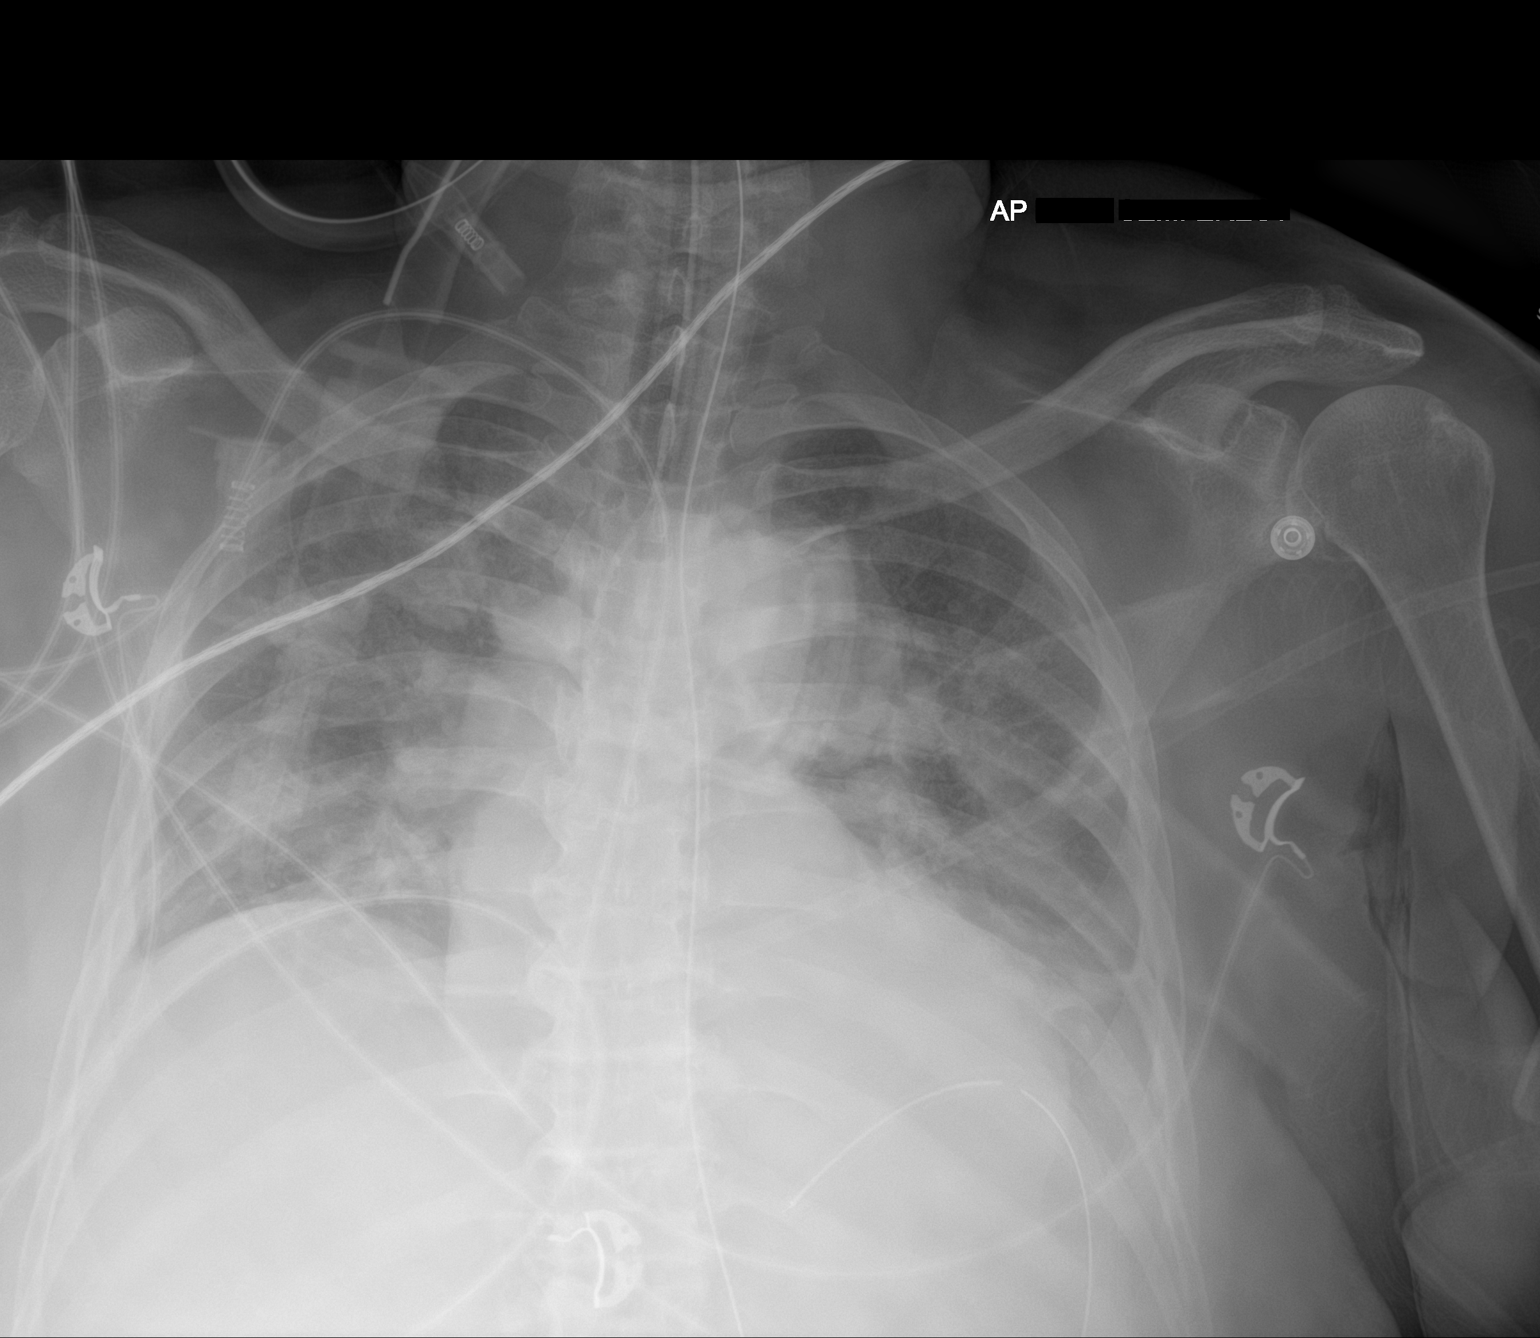

[1 of 1 positions shown; findings below may reference images not displayed]

FINDINGS: Tip of endotracheal tube projects 4.0 cm above carina.

Nasogastric tube coiled in proximal stomach.

Normal heart size, mediastinal contours, and pulmonary vascularity.

Diffuse BILATERAL pulmonary infiltrates with more focal atelectasis
versus consolidation of LEFT lower lobe.

No definite pleural effusion or pneumothorax.
IMPRESSION: Persistent pulmonary infiltrates with more focal atelectasis versus
consolidation of LEFT lower lobe.

## 2022-03-23 IMAGING — DX DG ABD PORTABLE 1V
1 series · 1 of 1 positions shown · non-contrast
Comparison: None.

CLINICAL DATA: Feeding tube placement

EXAM:
PORTABLE ABDOMEN - 1 VIEW

[abdomen kub]
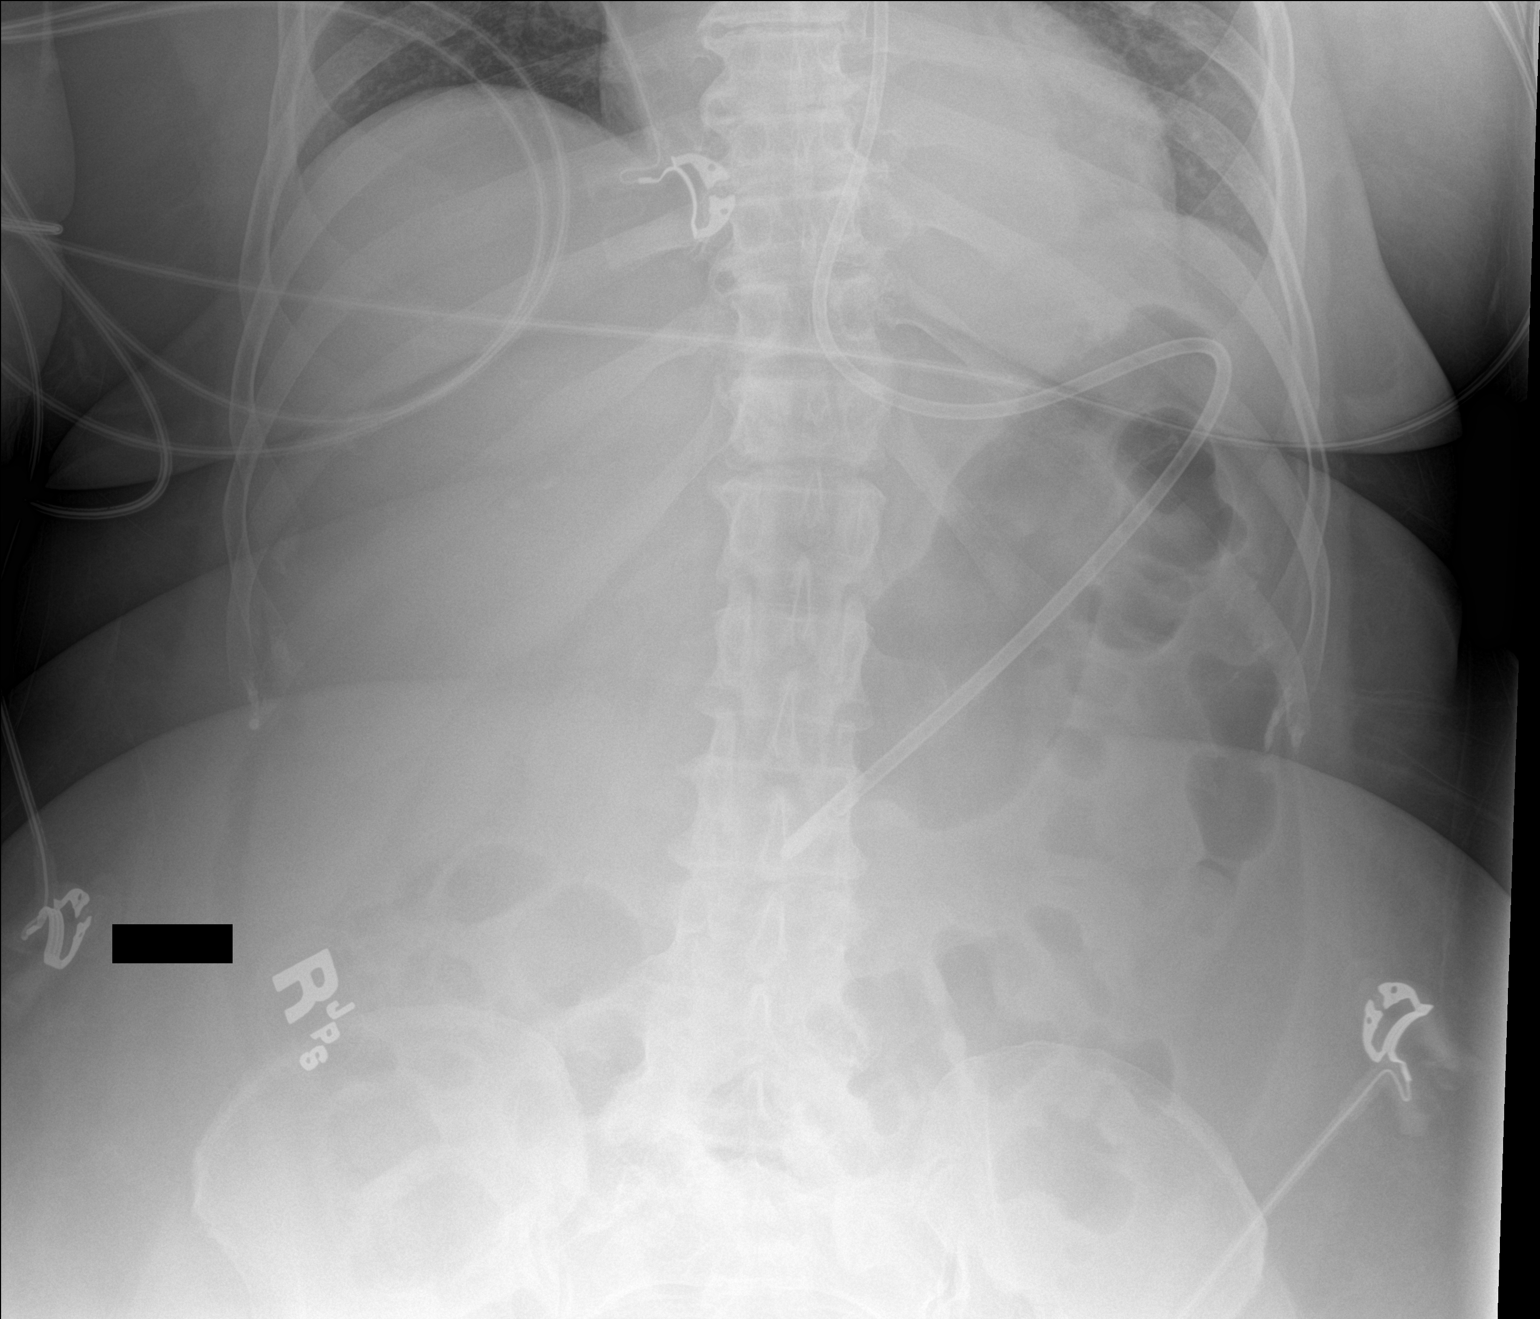

[1 of 1 positions shown; findings below may reference images not displayed]

FINDINGS: Feeding tube terminates in the distal stomach. Nonobstructive bowel
gas pattern where imaged. Mild right hemidiaphragm elevation. No
free intraperitoneal air.
IMPRESSION: Feeding tube terminating at the distal stomach.
